# Patient Record
Sex: Male | Born: 1955 | Race: White | Hispanic: No | Smoking: Former smoker
Health system: Southern US, Community
[De-identification: ages and names within clinical notes are randomized; demographics above are authoritative.]

## PROBLEM LIST (undated history)

## (undated) DIAGNOSIS — C801 Malignant (primary) neoplasm, unspecified: Secondary | ICD-10-CM

## (undated) DIAGNOSIS — K4090 Unilateral inguinal hernia, without obstruction or gangrene, not specified as recurrent: Secondary | ICD-10-CM

## (undated) DIAGNOSIS — T7840XA Allergy, unspecified, initial encounter: Secondary | ICD-10-CM

## (undated) HISTORY — PX: MOUTH SURGERY: SHX715

## (undated) HISTORY — DX: Allergy, unspecified, initial encounter: T78.40XA

## (undated) HISTORY — PX: OTHER SURGICAL HISTORY: SHX169

---

## 2008-04-19 ENCOUNTER — Emergency Department (HOSPITAL_COMMUNITY): Admission: EM | Admit: 2008-04-19 | Discharge: 2008-04-19 | Payer: Self-pay | Admitting: Emergency Medicine

## 2016-10-03 ENCOUNTER — Telehealth: Payer: Self-pay | Admitting: Internal Medicine

## 2016-10-03 ENCOUNTER — Ambulatory Visit (HOSPITAL_COMMUNITY)
Admission: EM | Admit: 2016-10-03 | Discharge: 2016-10-03 | Disposition: A | Discharge status: Home / Self Care | Payer: Self-pay | Attending: Family Medicine | Admitting: Family Medicine

## 2016-10-03 DIAGNOSIS — K6289 Other specified diseases of anus and rectum: Secondary | ICD-10-CM

## 2016-10-03 NOTE — Telephone Encounter (Signed)
I have left a voicemail for patient to call back. He is scheduled to see Alonza Bogus, PA-C on Thursday, 10/05/16 @ 3:00 pm. I have contacted Dr Joseph Art to make him aware that an appointment is in place for patient and we are trying to get in contact with patient.

## 2016-10-03 NOTE — Telephone Encounter (Signed)
Dr.Pyrtle PM DOD 10/03/16 please advise as to scheduling.

## 2016-10-03 NOTE — Telephone Encounter (Signed)
I have spoken to patient and have advised of appointment with Janett Billow on Thursday at 3. He verbalizes understanding.

## 2016-10-03 NOTE — Discharge Instructions (Signed)
[facility] will be calling you (Most likely Zenovia Jarred or his nurse) today.  If you don't hear from them, text me at [phone]. They will give you the soonest appt possible.

## 2016-10-03 NOTE — Telephone Encounter (Signed)
Please make patient a consult appointment ASAP with first available provider for rectal or anal mass with bleeding

## 2016-10-03 NOTE — ED Provider Notes (Signed)
[facility]    CSN: [id number] Arrival date & time: 10/03/16  1257     History   Chief Complaint Chief Complaint  Patient presents with  . Mass    HPI David Clarke is a 61 y.o. male.   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 He's had associated rectal bleeding and difficulty with hygiene. Has not had any tenderness however.      No past medical history on file.  There are no active problems to display for this patient.   No past surgical history on file.     Home Medications    Prior to Admission medications   Not on File    Family History No family history on file.  Social History Social History  Substance Use Topics  . Smoking status: Not on file  . Smokeless tobacco: Not on file  . Alcohol use Not on file     Allergies   Sulfa antibiotics   Review of Systems Review of Systems  Constitutional: Negative.   HENT: Negative.   Gastrointestinal: Positive for anal bleeding.     Physical Exam Triage Vital Signs ED Triage Vitals [10/03/16 1331]  Enc Vitals Group     BP 119/80     Pulse Rate 87     Resp 16     Temp 97.5 F (36.4 C)     Temp Source Oral     SpO2 100 %     Weight      Height      Head Circumference      Peak Flow      Pain Score      Pain Loc      Pain Edu?      Excl. in [place]?    No data found.   Updated Vital Signs BP 119/80 (BP Location: Right Arm)   Pulse 87   Temp 97.5 F (36.4 C) (Oral)   Resp 16   SpO2 100%   Visual Acuity Right Eye Distance:   Left Eye Distance:   Bilateral Distance:    Right Eye Near:   Left Eye Near:    Bilateral Near:     Physical Exam  Constitutional: He is oriented to person, place, and time. He appears well-developed and well-nourished.  HENT:  Right Ear: External ear normal.  Left Ear: External ear normal.  Eyes: Conjunctivae are normal. Pupils are equal, round, and reactive to light.  Neck: Normal range of motion. Neck supple.    Pulmonary/Chest: Effort normal.  Abdominal: Soft. Bowel sounds are normal. He exhibits no distension.  Trilobed anal mass  Musculoskeletal: Normal range of motion.  Neurological: He is alert and oriented to person, place, and time.  Skin: Skin is warm and dry.  Nursing note and vitals reviewed.    UC Treatments / Results  Labs (all labs ordered are listed, but only abnormal results are displayed) Labs Reviewed - No data to display  EKG  EKG Interpretation None       Radiology No results found.  Procedures Procedures (including critical care time)  Medications Ordered in UC Medications - No data to display   Initial Impression / Assessment and Plan / UC Course  I have reviewed the triage vital signs and the nursing notes.  Pertinent labs & imaging results that were available during my care of the patient were reviewed by me and considered in my medical decision making (see chart for details).  Final Clinical Impressions(s) / UC Diagnoses   Final diagnoses:  Mass of anus    New Prescriptions New Prescriptions   No medications on file  [facility] will be calling you (Most likely Zenovia Jarred or his nurse) today.  If you don't hear from them, text me at [phone]. They will give you the soonest appt possible.   Robyn Haber, MD 10/03/16 [phone]

## 2016-10-03 NOTE — ED Triage Notes (Addendum)
C/o growth on testicle area Notice discharge  Keeping area clean

## 2016-10-05 ENCOUNTER — Encounter: Payer: Self-pay | Admitting: Gastroenterology

## 2016-10-05 ENCOUNTER — Ambulatory Visit (INDEPENDENT_AMBULATORY_CARE_PROVIDER_SITE_OTHER): Payer: BLUE CROSS/BLUE SHIELD | Admitting: Gastroenterology

## 2016-10-05 ENCOUNTER — Other Ambulatory Visit (INDEPENDENT_AMBULATORY_CARE_PROVIDER_SITE_OTHER): Payer: BLUE CROSS/BLUE SHIELD

## 2016-10-05 VITALS — BP 120/68 | HR 82 | Ht 72.0 in | Wt 137.0 lb

## 2016-10-05 DIAGNOSIS — K625 Hemorrhage of anus and rectum: Secondary | ICD-10-CM

## 2016-10-05 DIAGNOSIS — K629 Disease of anus and rectum, unspecified: Secondary | ICD-10-CM

## 2016-10-05 DIAGNOSIS — K6289 Other specified diseases of anus and rectum: Secondary | ICD-10-CM

## 2016-10-05 DIAGNOSIS — C20 Malignant neoplasm of rectum: Secondary | ICD-10-CM | POA: Insufficient documentation

## 2016-10-05 LAB — CBC WITH DIFFERENTIAL/PLATELET
Basophils Absolute: 0 10*3/uL (ref 0.0–0.1)
Basophils Relative: 0.4 % (ref 0.0–3.0)
EOS PCT: 0.4 % (ref 0.0–5.0)
Eosinophils Absolute: 0 10*3/uL (ref 0.0–0.7)
HCT: 26.1 % — ABNORMAL LOW (ref 39.0–52.0)
HEMOGLOBIN: 8.3 g/dL — AB (ref 13.0–17.0)
LYMPHS PCT: 14.1 % (ref 12.0–46.0)
Lymphs Abs: 1.5 10*3/uL (ref 0.7–4.0)
MCHC: 31.7 g/dL (ref 30.0–36.0)
MCV: 73.6 fl — AB (ref 78.0–100.0)
Monocytes Absolute: 0.9 10*3/uL (ref 0.1–1.0)
Monocytes Relative: 8.5 % (ref 3.0–12.0)
Neutro Abs: 8.2 10*3/uL — ABNORMAL HIGH (ref 1.4–7.7)
Neutrophils Relative %: 76.6 % (ref 43.0–77.0)
Platelets: 630 10*3/uL — ABNORMAL HIGH (ref 150.0–400.0)
RBC: 3.55 Mil/uL — AB (ref 4.22–5.81)
RDW: 15.5 % (ref 11.5–15.5)
WBC: 10.7 10*3/uL — ABNORMAL HIGH (ref 4.0–10.5)

## 2016-10-05 LAB — BASIC METABOLIC PANEL
BUN: 13 mg/dL (ref 6–23)
CALCIUM: 8.9 mg/dL (ref 8.4–10.5)
CHLORIDE: 100 meq/L (ref 96–112)
CO2: 28 meq/L (ref 19–32)
CREATININE: 0.74 mg/dL (ref 0.40–1.50)
GFR: 114.22 mL/min (ref >60.00)
GLUCOSE: 104 mg/dL — AB (ref 70–99)
Potassium: 4.1 mEq/L (ref 3.5–5.1)
Sodium: 136 mEq/L (ref 135–145)

## 2016-10-05 MED ORDER — NA SULFATE-K SULFATE-MG SULF 17.5-3.13-1.6 GM/177ML PO SOLN: 1.0000 | ORAL | 0 refills | Status: DC

## 2016-10-05 NOTE — Progress Notes (Addendum)
     10/05/2016 [name] [id number] [birth year]   HISTORY OF PRESENT ILLNESS:  This is a pleasant 61 year old male who is new to our practice. He was referred to our office by Dr. Joseph Art, who is a friend of his. He says he noticed an anal or rectal mass about 5 months ago, but he did not have insurance and did not get it checked. It has grown significantly over the past 5 months. He now has insurance and saw Dr. Joseph Art just 2 days ago. This urgent referral was made to our office. The patient admits to intermittent rectal bleeding. He tells me that he has been passing stool and moves his bowels quite regularly on a daily basis. He does have some discomfort in his rectum and anal area.  History reviewed. No pertinent past medical history. Past Surgical History:  Procedure Laterality Date  . MOUTH SURGERY     age 63, to remove extra "eye" teeth    reports that he quit smoking about 16 years ago. His smoking use included Cigarettes. He quit after 2.00 years of use. His smokeless tobacco use includes Chew. He reports that he drinks alcohol. He reports that he does not use drugs. family history includes Emphysema in his father; Other in his mother; Prostate cancer in his father; Stroke in his mother. Allergies  Allergen Reactions  . Sulfa Antibiotics Nausea Only      No outpatient encounter prescriptions on file as of 10/05/2016.   No facility-administered encounter medications on file as of 10/05/2016.      REVIEW OF SYSTEMS  : All other systems reviewed and negative except where noted in the History of Present Illness.   PHYSICAL EXAM: BP 120/68   Pulse 82   Ht 6' (1.829 m)   Wt 137 lb (62.1 kg)   BMI 18.58 kg/m  General: Well developed white male in no acute distress Head: Normocephalic and atraumatic Eyes:  Sclerae anicteric, conjunctiva pink. Ears: Normal auditory acuity Lungs: Clear throughout to auscultation Heart: Regular rate and rhythm Abdomen: Soft, non-distended.  Normal bowel sounds.  Non-tender. Rectal:  Large fungating mass protruding from the rectum.  This is very friable and bled on contact.  DRE was performed and the mass was felt internally as well. Musculoskeletal: Symmetrical with no gross deformities  Skin: No lesions on visible extremities Extremities: No edema  Neurological: Alert oriented x 4, grossly non-focal Psychological:  Alert and cooperative. Normal mood and affect  ASSESSMENT AND PLAN: -Rectal mass:  This most certainly looks malignant.  Discussed with Dr. Loletha Carrow who examined him as well.  The patient was referred to and specifically requested Dr. Hilarie Fredrickson. I am scheduling him for colonoscopy with Dr. Hilarie Fredrickson early next week. We will also obtain CT scan of the abdomen and pelvis with contrast. We'll check a CBC and BMP.  I am having our office contact colorectal surgery, Dr. Marcello Moores, to arrange for appt with her in the next couple of weeks as well.   CC:  No ref. provider found  Addendum: Reviewed and agree with initial management. Jerene Bears, MD

## 2016-10-05 NOTE — Patient Instructions (Signed)
You have been scheduled for a colonoscopy. Please follow written instructions given to you at your visit today.  Please pick up your prep supplies at the pharmacy within the next 1-3 days. If you use inhalers (even only as needed), please bring them with you on the day of your procedure. Your physician has requested that you go to www.startemmi.com and enter the access code given to you at your visit today. This web site gives a general overview about your procedure. However, you should still follow specific instructions given to you by our office regarding your preparation for the procedure.  You have been scheduled for a CT scan of the abdomen and pelvis at [facility] ([address]---this is in the same building as Press [facility]).   You are scheduled on 10-13-16 at 11 am. You should arrive 15 minutes prior to your appointment time for registration. Please follow the written instructions below on the day of your exam:  WARNING: IF YOU ARE ALLERGIC TO IODINE/X-RAY DYE, PLEASE NOTIFY RADIOLOGY IMMEDIATELY AT [phone]! YOU WILL BE GIVEN A 13 HOUR PREMEDICATION PREP.  1) Do not eat or drink anything after 7 am (4 hours prior to your test) 2) You have been given 2 bottles of oral contrast to drink. The solution may taste               better if refrigerated, but do NOT add ice or any other liquid to this solution. Shake             well before drinking.    Drink 1 bottle of contrast @ 9 am (2 hours prior to your exam)  Drink 1 bottle of contrast @ 10 am (1 hour prior to your exam)  You may take any medications as prescribed with a small amount of water except for the following: Metformin, Glucophage, Glucovance, Avandamet, Riomet, Fortamet, Actoplus Met, Janumet, Glumetza or Metaglip. The above medications must be held the day of the exam AND 48 hours after the exam.  The purpose of you drinking the oral contrast is to aid in the visualization of your intestinal tract. The  contrast solution may cause some diarrhea. Before your exam is started, you will be given a small amount of fluid to drink. Depending on your individual set of symptoms, you may also receive an intravenous injection of x-ray contrast/dye. Plan on being at [facility] for 30 minutes or longer, depending on the type of exam you are having performed.  This test typically takes 30-45 minutes to complete.  If you have any questions regarding your exam or if you need to reschedule, you may call the CT department at [phone] between the hours of 8:00 am and 5:00 pm, Monday-Friday.  ________________________________________________________________________   We will call you with a referral to [facility].

## 2016-10-06 ENCOUNTER — Telehealth: Payer: Self-pay | Admitting: Emergency Medicine

## 2016-10-06 NOTE — Telephone Encounter (Signed)
Appointment with [facility] is 10/17/16 at 10:15 am with Dr. Johney Maine. Patient is aware.

## 2016-10-09 ENCOUNTER — Telehealth: Payer: Self-pay

## 2016-10-09 ENCOUNTER — Other Ambulatory Visit: Payer: Self-pay

## 2016-10-09 ENCOUNTER — Ambulatory Visit (AMBULATORY_SURGERY_CENTER): Payer: BLUE CROSS/BLUE SHIELD | Admitting: Internal Medicine

## 2016-10-09 ENCOUNTER — Encounter: Payer: Self-pay | Admitting: Internal Medicine

## 2016-10-09 VITALS — BP 120/72 | HR 77 | Temp 98.0°F | Resp 14 | Ht 72.0 in | Wt 137.0 lb

## 2016-10-09 DIAGNOSIS — K6289 Other specified diseases of anus and rectum: Secondary | ICD-10-CM

## 2016-10-09 DIAGNOSIS — C801 Malignant (primary) neoplasm, unspecified: Secondary | ICD-10-CM

## 2016-10-09 DIAGNOSIS — K629 Disease of anus and rectum, unspecified: Secondary | ICD-10-CM

## 2016-10-09 DIAGNOSIS — K625 Hemorrhage of anus and rectum: Secondary | ICD-10-CM

## 2016-10-09 HISTORY — DX: Malignant (primary) neoplasm, unspecified: C80.1

## 2016-10-09 HISTORY — PX: COLONOSCOPY W/ BIOPSIES: SHX1374

## 2016-10-09 MED ORDER — SODIUM CHLORIDE 0.9 % IV SOLN: 500.0000 mL | INTRAVENOUS | Status: DC

## 2016-10-09 NOTE — Patient Instructions (Signed)
YOU HAD AN ENDOSCOPIC PROCEDURE TODAY AT [facility]:   Refer to the procedure report that was given to you for any specific questions about what was found during the examination.  If the procedure report does not answer your questions, please call your gastroenterologist to clarify.  If you requested that your care partner not be given the details of your procedure findings, then the procedure report has been included in a sealed envelope for you to review at your convenience later.  YOU SHOULD EXPECT: Some feelings of bloating in the abdomen. Passage of more gas than usual.  Walking can help get rid of the air that was put into your GI tract during the procedure and reduce the bloating. If you had a lower endoscopy (such as a colonoscopy or flexible sigmoidoscopy) you may notice spotting of blood in your stool or on the toilet paper. If you underwent a bowel prep for your procedure, you may not have a normal bowel movement for a few days.  Please Note:  You might notice some irritation and congestion in your nose or some drainage.  This is from the oxygen used during your procedure.  There is no need for concern and it should clear up in a day or so.  SYMPTOMS TO REPORT IMMEDIATELY:   For urgent or emergent issues, a gastroenterologist can be reached at any hour by calling [phone].   DIET:  Low fiber diet until further notice..  Drink plenty of fluids but you should avoid alcoholic beverages for 24 hours.  ACTIVITY:  You should plan to take it easy for the rest of today and you should NOT DRIVE or use heavy machinery until tomorrow (because of the sedation medicines used during the test).    FOLLOW UP: Our staff will call the number listed on your records the next business day following your procedure to check on you and address any questions or concerns that you may have regarding the information given to you following your procedure. If we do not reach you, we will leave  a message.  However, if you are feeling well and you are not experiencing any problems, there is no need to return our call.  We will assume that you have returned to your regular daily activities without incident.  If any biopsies were taken you will be contacted by phone or by letter within the next 1-3 weeks.  Please call us at [phone] if you have not heard about the biopsies in 3 weeks.    SIGNATURES/CONFIDENTIALITY: You and/or your care partner have signed paperwork which will be entered into your electronic medical record.  These signatures attest to the fact that that the information above on your After Visit Summary has been reviewed and is understood.  Full responsibility of the confidentiality of this discharge information lies with you and/or your care-partner.  Your pathology was sent rush for you.  The 3rd floor staff will arrange your CT's for you.  Call us if you need [place] !

## 2016-10-09 NOTE — Progress Notes (Signed)
Pt reported last BM was dark, brown liquid.  He said he was unable to see through it.  I reported this to Dr. Hilarie Fredrickson and he said we will proceed with colonoscopy without any further prep.  Also reported to Joe Rybecki, CRNA pt had drank approximately 2 oz of gator-aid at 13:45. maw

## 2016-10-09 NOTE — Telephone Encounter (Signed)
Per Dr. Hilarie Fredrickson CT of chest needs to be added for pt. Spoke with [place] CT and order placed in epic and has been added for 10/13/16.

## 2016-10-09 NOTE — Progress Notes (Signed)
A/ox3, pleased with MAC, report to RN 

## 2016-10-09 NOTE — Progress Notes (Signed)
Called to room to assist during endoscopic procedure.  Patient ID and intended procedure confirmed with present staff. Received instructions for my participation in the procedure from the performing physician.  

## 2016-10-09 NOTE — Op Note (Signed)
[facility] Patient Name: David Clarke Procedure Date: 10/09/2016 3:10 PM MRN: [id number] Endoscopist: Jerene Bears , MD Age: 61 Referring MD:  Date of Birth: 01-25-1956 Gender: Male Account #: [id number] Procedure:                Colonoscopy Indications:              Rectal bleeding, Abnormal rectal exam Medicines:                Monitored Anesthesia Care Procedure:                Pre-Anesthesia Assessment:                           - Prior to the procedure, a History and Physical                            was performed, and patient medications and                            allergies were reviewed. The patient's tolerance of                            previous anesthesia was also reviewed. The risks                            and benefits of the procedure and the sedation                            options and risks were discussed with the patient.                            All questions were answered, and informed consent                            was obtained. Prior Anticoagulants: The patient has                            taken no previous anticoagulant or antiplatelet                            agents. ASA Grade Assessment: II - A patient with                            mild systemic disease. After reviewing the risks                            and benefits, the patient was deemed in                            satisfactory condition to undergo the procedure.                           After obtaining informed consent, the colonoscope  was passed under direct vision. Throughout the                            procedure, the patient's blood pressure, pulse, and                            oxygen saturations were monitored continuously. The                            Model PCF-[id number]) scope was introduced                            through the anus with the intention of advancing to                            the cecum. The scope was  advanced to the rectum                            before the procedure was aborted. Medications were                            given. The colonoscopy was performed with                            difficulty due to a partially obstructing mass. The                            patient tolerated the procedure well. The quality                            of the bowel preparation was fair. The rectum was                            photographed. Scope In: 3:27:38 PM Scope Out: 3:36:11 PM Total Procedure Duration: 0 hours 8 minutes 33 seconds  Findings:                 The digital rectal exam revealed a large, firm,                            prolapsing and obstructing rectal mass. The mass                            was circumferential.                           A fungating, infiltrative and ulcerated partially                            obstructing large mass was found in the distal                            rectum. The mass was circumferential. The mass  measured seven cm in length (prolapsing and from                            dentate line to approx 7 cm). In addition, its                            inner diameter measured nine mm. Oozing was                            present. This was biopsied with a cold forceps for                            histology.                           Due to the distal rectal mass the bowel preparation                            was poor and unable to be cleared via the adult                            upper endoscope (adult upper endoscope used due to                            obstructing mass). Scope not advanced proximal to                            the rectosigmoid colon. Complications:            No immediate complications. Estimated Blood Loss:     Estimated blood loss was minimal. Impression:               - Preparation of the colon was fair/poor due to                            obstructing rectal mass.                            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 Biopsied.                           - Incomplete colonoscopy. Recommendation:           - Patient has a contact number available for                            emergencies. The signs and symptoms of potential                            delayed complications were discussed with the  patient. Return to normal activities tomorrow.                            Written discharge instructions were provided to the                            patient.                           - Low fiber diet.                           - Continue present medications.                           - Await pathology results.                           - Add CT chest to CT abd/pelvis ordered for later                            this week.                           - Surgical and oncology referrals.                           - Full colonoscopy once treatment plan outlined. Jerene Bears, MD 10/09/2016 3:51:11 PM This report has been signed electronically.

## 2016-10-10 ENCOUNTER — Telehealth: Payer: Self-pay | Admitting: *Deleted

## 2016-10-10 ENCOUNTER — Telehealth: Payer: Self-pay | Admitting: Internal Medicine

## 2016-10-10 ENCOUNTER — Other Ambulatory Visit: Payer: Self-pay

## 2016-10-10 DIAGNOSIS — K6289 Other specified diseases of anus and rectum: Secondary | ICD-10-CM

## 2016-10-10 NOTE — Telephone Encounter (Signed)
  Follow up Call-  Call back number 10/09/2016  Post procedure Call Back phone  # [phone] cell  Permission to leave phone message Yes  Some recent data might be hidden     Patient questions:  Do you have a fever, pain , or abdominal swelling? No. Pain Score  0 *  Have you tolerated food without any problems? Yes.    Have you been able to return to your normal activities? Yes.    Do you have any questions about your discharge instructions: Diet   No. Medications  No. Follow up visit  No.  Do you have questions or concerns about your Care? No.  Actions: * If pain score is 4 or above: No action needed, pain <4.

## 2016-10-10 NOTE — Telephone Encounter (Signed)
Discussed with pt that he is having the ct scan on 10/13/16. Pt was confused because [facility] called about an appt on 10/17/16 and he thought that was for the CT. Pts questions answered.

## 2016-10-12 ENCOUNTER — Telehealth: Payer: Self-pay | Admitting: Internal Medicine

## 2016-10-12 NOTE — Telephone Encounter (Signed)
Spoke with pt and he originally told Dr. Hilarie Fredrickson that he wanted to know his CT results as soon as we got them back tomorrow. Pt has now decided he does not want the results until Monday, states he does not want to deal with bad news over the weekend. Dr. Loletha Carrow had agreed to call pt with results tomorrow. Dr. Hilarie Fredrickson and Dr. Loletha Carrow notified.

## 2016-10-13 ENCOUNTER — Ambulatory Visit (INDEPENDENT_AMBULATORY_CARE_PROVIDER_SITE_OTHER)
Admission: RE | Admit: 2016-10-13 | Discharge: 2016-10-13 | Disposition: A | Discharge status: Home / Self Care | Payer: BLUE CROSS/BLUE SHIELD | Source: Ambulatory Visit | Attending: Gastroenterology | Admitting: Gastroenterology

## 2016-10-13 ENCOUNTER — Ambulatory Visit (INDEPENDENT_AMBULATORY_CARE_PROVIDER_SITE_OTHER)
Admission: RE | Admit: 2016-10-13 | Discharge: 2016-10-13 | Disposition: A | Discharge status: Home / Self Care | Payer: BLUE CROSS/BLUE SHIELD | Source: Ambulatory Visit | Attending: Internal Medicine | Admitting: Internal Medicine

## 2016-10-13 DIAGNOSIS — K629 Disease of anus and rectum, unspecified: Secondary | ICD-10-CM | POA: Diagnosis not present

## 2016-10-13 DIAGNOSIS — K625 Hemorrhage of anus and rectum: Secondary | ICD-10-CM

## 2016-10-13 DIAGNOSIS — K6289 Other specified diseases of anus and rectum: Secondary | ICD-10-CM

## 2016-10-13 MED ORDER — IOPAMIDOL (ISOVUE-300) INJECTION 61%
100.0000 mL | Freq: Once | INTRAVENOUS | Status: AC | PRN
  Administered 2016-10-13: 100 mL via INTRAVENOUS

## 2016-10-16 ENCOUNTER — Encounter: Payer: Self-pay | Admitting: Hematology

## 2016-10-16 ENCOUNTER — Encounter: Payer: Self-pay | Admitting: Radiation Oncology

## 2016-10-17 ENCOUNTER — Other Ambulatory Visit: Payer: Self-pay | Admitting: Surgery

## 2016-10-17 ENCOUNTER — Encounter: Payer: Self-pay | Admitting: Surgery

## 2016-10-17 ENCOUNTER — Telehealth: Payer: Self-pay | Admitting: Internal Medicine

## 2016-10-17 ENCOUNTER — Encounter: Payer: Self-pay | Admitting: Radiation Oncology

## 2016-10-17 DIAGNOSIS — C20 Malignant neoplasm of rectum: Secondary | ICD-10-CM

## 2016-10-17 NOTE — Progress Notes (Addendum)
GI Location of Tumor / Histology: Rectal Cancer  David Clarke presented  months ago with symptoms of:  Intermittent Bleeding, pain , patient noticed mass 5 months ago, but without insurance,didn't get it checked,   Biopsies of  (if applicable) revealed: Diagnosis 10/09/2016: Rectum, biopsy, mass - ADENOCARCINOMA.  Past/Anticipated interventions by surgeon, if any: Dr. Pyrtle,colonoscopy with biopsy 10/09/16, surgical consult 10/17/16 scheduled with Dr. Marcello Moores  Past/Anticipated interventions by medical oncology, if any: sees Dr. Burr Medico today at noon  Weight changes, if any: loss 13-15 lbs since december Bowel/Bladder complaints, if any: discharge,  And some bleeding at night with bowel movement, wears pad  Nausea / Vomiting, if any: No  Pain issues, if any:  Discon=mfort with bowel movenments  Any blood per rectum:     SAFETY ISSUES:  Prior radiation?  NO  Pacemaker/ICD? NO   Is the patient on methotrexate? No  Current Complaints/Details: after 2 years  quit smoking cigarettes 16 years ago, does use chewing tobacco, drinks alcohol, no drug use  Father emphysema,, prostate cancr,  Mother Stroke BP 119/73 (BP Location: Left Arm, Patient Position: Sitting, Cuff Size: Normal)   Pulse 95   Temp 99.5 F (37.5 C) (Oral)   Wt Readings from Last 3 Encounters:  10/24/16 137 lb (62.1 kg)  10/09/16 137 lb (62.1 kg)  10/05/16 137 lb (62.1 kg)

## 2016-10-17 NOTE — Telephone Encounter (Signed)
Dr. Hilarie Fredrickson please see note below and call pt regarding CT scan.

## 2016-10-17 NOTE — Telephone Encounter (Signed)
I called the patient this morning at 11:40 am and left a message to discuss results of staging CT scan I called back this afternoon at 4:50 pm and again got voicemail.  I left word that I would try him back again tomorrow

## 2016-10-20 ENCOUNTER — Ambulatory Visit (HOSPITAL_COMMUNITY): Admission: RE | Admit: 2016-10-20 | Payer: BLUE CROSS/BLUE SHIELD | Source: Ambulatory Visit

## 2016-10-20 ENCOUNTER — Telehealth: Payer: Self-pay | Admitting: Hematology

## 2016-10-20 NOTE — Telephone Encounter (Signed)
Pt cld wanting to reschedule appt w/Dr. Burr Medico. MRI was scheduled on the evening he was due to see her. Per Dr, Burr Medico, ok schedule the pt to see her following his appt w/Dr. Lisbeth Renshaw on 2/28. Pt agreed to the appt.

## 2016-10-24 ENCOUNTER — Ambulatory Visit (HOSPITAL_COMMUNITY)
Admission: RE | Admit: 2016-10-24 | Discharge: 2016-10-24 | Disposition: A | Discharge status: Home / Self Care | Payer: BLUE CROSS/BLUE SHIELD | Source: Ambulatory Visit | Attending: Surgery | Admitting: Surgery

## 2016-10-24 ENCOUNTER — Ambulatory Visit: Payer: BLUE CROSS/BLUE SHIELD | Admitting: Hematology

## 2016-10-24 DIAGNOSIS — K4090 Unilateral inguinal hernia, without obstruction or gangrene, not specified as recurrent: Secondary | ICD-10-CM | POA: Diagnosis not present

## 2016-10-24 DIAGNOSIS — N433 Hydrocele, unspecified: Secondary | ICD-10-CM | POA: Insufficient documentation

## 2016-10-24 DIAGNOSIS — C20 Malignant neoplasm of rectum: Secondary | ICD-10-CM | POA: Insufficient documentation

## 2016-10-24 MED ORDER — GADOBENATE DIMEGLUMINE 529 MG/ML IV SOLN
15.0000 mL | Freq: Once | INTRAVENOUS | Status: AC | PRN
  Administered 2016-10-24: 12 mL via INTRAVENOUS

## 2016-10-24 NOTE — Progress Notes (Signed)
[facility]  Telephone:([phone]) [phone] Fax:([phone]) [place] Note   Patient Care Team: Robyn Haber, MD as PCP - General (Family Medicine) Michael Boston, MD as Consulting Physician (General Surgery) Jerene Bears, MD as Consulting Physician (Gastroenterology) Truitt Merle, MD as Consulting Physician (Hematology) Kyung Rudd, MD as Consulting Physician (Radiation Oncology) 10/25/2016  CHIEF COMPLAINTS/PURPOSE OF CONSULTATION:  Rectal adenocarcinoma   Oncology History   Cancer Staging Rectal adenocarcinoma  Staging form: Colon and Rectum, AJCC 8th Edition - Clinical stage from 10/09/2016: Stage IIIB (cT3, cN1b, cM0) - Signed by Truitt Merle, MD on 10/25/2016       Rectal adenocarcinoma    10/03/2016 - 10/03/2016 Hospital Admission    Admit date: 10/03/16 The patient presented to the [facility] ED with rectal bleeding and difficulty with hygiene.      10/09/2016 Initial Diagnosis    Rectal adenocarcinoma       10/09/2016 Procedure    Colonoscopy showed a fungating, infiltrative and ulcerated partially obstructing large mass in the distal rectum, measuring 9 cm, prolapsing at anal canal. Scope not advanced proximal to the rectosigmoid colon.      10/09/2016 Initial Biopsy    Rectal mass biopsy showed invasive adenocarcinoma.      10/13/2016 Imaging    CT Chest Abdomen Pelvis w contrast IMPRESSION: Large irregular enhancing rectal mass consistent with known rectal cancer. There are small perirectal and small pelvic sidewall lymph nodes which are suspicious. Prominent perirectal vessels are also noted. No evidence of metastatic retroperitoneal or sigmoid mesocolon adenopathy or hepatic or pulmonary metastasis.      10/24/2016 Imaging    MRI Pelvis IMPRESSION: 1. Moderately motion degraded exam. 2. Large mass is centered at the anus with extension into the low rectum. No complicating obstruction. 3. Mild mesorectal adenopathy, suspicious. 4. Right  inguinal hernia containing nonobstructive small bowel.        HISTORY OF PRESENTING ILLNESS (10/25/16):  David Clarke 61 y.o. male is here because of his recently diagnosed rectal adenocarcinoma. He was referred by his gastroenterologist title parietal. Patient presents to my clinic, accompanied by his wife and his daughter.   He has noticed a anal mass accompanied by intermittent bleeding and pain in the rectum for the past 5 months. The patient initially presented to the [facility] ED on 10/03/16 with rectal bleeding and difficulty with hygeine. He was without health insurance previously and so did not pursue evaluation or treatment until this time.  The patient was seen by Dr. Hilarie Fredrickson for a colonoscopy and biopsy on 10/09/16, revealing a large partially obstructing low rectal mass, prolapsing to anal verge. Biopsy showed adenocarcinoma. CT C/A/P on 10/13/16 revealed a large, irregular, enhancing mass consistent with known rectal cancer. There were several suspicious small perirectal and small pelvic sidewall lymph nodes. Prominent perirectal vessels were also noted. MRI of the pelvis on 10/24/16 showed a large mass centered at the anus with extension into the low rectum without obstruction. There was suspicious mild mesorectal adenopathy, and a right inguinal hernia containing nonobstructive small bowel.   The patient comes to the clinic for consultation today accompanied by his partner and daughter. He denies abdominal cramps, nausea, bowel or bladder concerns. He reports taking OTC stool softener as needed. He reports his appetite is good, but he has lost 12-15 lbs in the past few weeks. His daughter notes she has hemochromatosis and questions if this could be playing a role in the patient's anemia.   MEDICAL HISTORY:  Past Medical History:  Diagnosis Date  . Allergy   . Cancer ([facility]) 10/09/2016   rectal adenocarcinoma    SURGICAL HISTORY: Past Surgical History:  Procedure Laterality Date  .  COLONOSCOPY W/ BIOPSIES Bilateral 10/09/2016   rectal adenocarcinoma  . MOUTH SURGERY     age 64, to remove extra "eye" teeth  . [place] teeth extraction      SOCIAL HISTORY: Social History   Social History  . Marital status: Single    Spouse name: N/A  . Number of children: 1  . Years of education: N/A   Occupational History  . restaurant/caterer    Social History Main Topics  . Smoking status: Former Smoker    Years: 2.00    Types: Cigarettes    Quit date: 2002  . Smokeless tobacco: Never Used  . Alcohol use 6.0 oz/week    4 Glasses of wine, 6 Cans of beer per week     Comment: beer or a glass or wine most days of the week  . Drug use: No  . Sexual activity: Yes    Partners: Female   Other Topics Concern  . Not on file   Social History Narrative  . No narrative on file    FAMILY HISTORY: Family History  Problem Relation Age of Onset  . Other Mother     bile duct tumor  . Stroke Mother   . Cancer Mother 7    bile duct cancer   . Prostate cancer Father 48  . Emphysema Father   . Colon cancer Neg Hx   . Esophageal cancer Neg Hx   . Pancreatic cancer Neg Hx   . Rectal cancer Neg Hx   . Stomach cancer Neg Hx     ALLERGIES:  is allergic to sulfa antibiotics.  MEDICATIONS:  Current Outpatient Prescriptions  Medication Sig Dispense Refill  . acetaminophen (TYLENOL) 325 MG tablet Take 650 mg by mouth every 6 (six) hours as needed.    . Sennosides (SENOKOT PO) Take 1 tablet by mouth as needed. [facility] brand     Current Facility-Administered Medications  Medication Dose Route Frequency Provider Last Rate Last Dose  . 0.9 %  sodium chloride infusion  500 mL Intravenous Continuous Jerene Bears, MD        REVIEW OF SYSTEMS:   Constitutional: Denies fevers, chills or abnormal night sweats Eyes: Denies blurriness of vision, double vision or watery eyes Ears, nose, mouth, throat, and face: Denies mucositis or sore throat Respiratory: Denies cough, dyspnea or  wheezes Cardiovascular: Denies palpitation, chest discomfort or lower extremity swelling Gastrointestinal:  Denies nausea, heartburn or change in bowel habits Skin: Denies abnormal skin rashes Lymphatics: Denies new lymphadenopathy or easy bruising Neurological:Denies numbness, tingling or new weaknesses Behavioral/Psych: Mood is stable, no new changes  All other systems were reviewed with the patient and are negative.   PHYSICAL EXAMINATION: ECOG PERFORMANCE STATUS: 1 - Symptomatic but completely ambulatory  Vitals:   10/25/16 1224  BP: 131/80  Pulse: 76  Resp: 18  Temp: 97.9 F (36.6 C)   Filed Weights   10/25/16 1224  Weight: 138 lb 11.2 oz (62.9 kg)    GENERAL:alert, no distress and comfortable.  SKIN: skin color, texture, turgor are normal, no rashes or significant lesions EYES: normal, conjunctiva are pink and non-injected, sclera clear OROPHARYNX:no exudate, no erythema and lips, buccal mucosa, and tongue normal  NECK: supple, thyroid normal size, non-tender, without nodularity LYMPH:  no palpable lymphadenopathy in the cervical,  axillary or inguinal LUNGS: clear to auscultation and percussion with normal breathing effort HEART: regular rate & rhythm and no murmurs and no lower extremity edema ABDOMEN:abdomen soft, non-tender and normal bowel sounds Musculoskeletal:no cyanosis of digits and no clubbing  PSYCH: alert & oriented x 3 with fluent speech NEURO: no focal motor/sensory deficits.  RECTAL: there is a large prolapsing mass at his anal verge, no bleeding, Rectal exam was not performed.   LABORATORY DATA:  I have reviewed the data as listed CBC Latest Ref Rng & Units 10/05/2016  WBC 4.0 - 10.5 K/uL 10.7(H)  Hemoglobin 13.0 - 17.0 g/dL 8.3(L)  Hematocrit 39.0 - 52.0 % 26.1(L)  Platelets 150.0 - 400.0 K/uL 630.0(H)   CMP Latest Ref Rng & Units 10/05/2016  Glucose 70 - 99 mg/dL 104(H)  BUN 6 - 23 mg/dL 13  Creatinine 0.40 - 1.50 mg/dL 0.74  Sodium 135 - 145  mEq/L 136  Potassium 3.5 - 5.1 mEq/L 4.1  Chloride 96 - 112 mEq/L 100  CO2 19 - 32 mEq/L 28  Calcium 8.4 - 10.5 mg/dL 8.9   PATHOLOGY:  Diagnosis 10/09/2016 Rectum, biopsy, mass - ADENOCARCINOMA. Microscopic Comment The features are consistent with colorectal-type adenocarcinoma.  RADIOGRAPHIC STUDIES: I have personally reviewed the radiological images as listed and agreed with the findings in the report. Ct Chest W Contrast  Result Date: 10/13/2016 CLINICAL DATA:  Newly diagnosed rectal cancer. Rectal bleeding for several months. EXAM: CT CHEST, ABDOMEN, AND PELVIS WITH CONTRAST TECHNIQUE: Multidetector CT imaging of the chest, abdomen and pelvis was performed following the standard protocol during bolus administration of intravenous contrast. CONTRAST:  173m ISOVUE-300 IOPAMIDOL (ISOVUE-300) INJECTION 61% COMPARISON:  None. FINDINGS: CT CHEST FINDINGS Chest wall: No chest wall mass, supraclavicular or axillary lymphadenopathy. Small scattered lymph nodes are noted. The thyroid gland appears normal. Cardiovascular: The heart is normal in size. No pericardial effusion. Mild tortuosity of the thoracic aorta but no aneurysm or dissection. The branch vessels patent. No definite coronary artery calcifications. Mediastinum/Nodes: No mediastinal or hilar mass or adenopathy. The esophagus is normal. Lungs/Pleura: No acute pulmonary findings. No worrisome pulmonary lesions to suggest metastatic disease. No pleural effusion. Musculoskeletal: No significant bony findings. CT ABDOMEN PELVIS FINDINGS Hepatobiliary: No focal hepatic lesions to suggest metastatic disease. The gallbladder is normal. No common bile duct dilatation. The portal and hepatic veins are patent. Pancreas: No mass, inflammation or ductal dilatation. Spleen: Normal size.  No focal lesions. Adrenals/Urinary Tract: The adrenal glands and kidneys are unremarkable. Bladder is normal. Stomach/Bowel: The stomach, duodenum, small bowel and colon  are unremarkable. No inflammatory changes, mass lesions or obstructive findings. Bilateral inguinal hernias, right much larger than left both containing small bowel loops but no obstruction or incarceration. There is a large irregular enhancing rectal mass measuring approximately 7.6 x 5.6 x 4.3 cm. Anal involvement is possible. No involvement of the ischiorectal fossa. There are prominent perirectal vessels and small scattered perirectal lymph nodes. The largest measures 7 mm on the left side on image number 119. No inguinal adenopathy. No definite adenopathy along the sigmoid mesocolon for retroperitoneum. Vascular/Lymphatic: The aorta is normal in caliber. No dissection. The branch vessels are patent. The major venous structures are patent. No mesenteric or retroperitoneal mass or adenopathy. Small scattered lymph nodes are noted. Reproductive: The prostate gland and seminal vesicles are unremarkable. Other: No free pelvic fluid collections. No abdominal wall hernia or subcutaneous lesions. Musculoskeletal: No significant bony findings. IMPRESSION: Large irregular enhancing rectal mass consistent with known rectal  cancer. There are small perirectal and small pelvic sidewall lymph nodes which are suspicious. Prominent perirectal vessels are also noted. No evidence of metastatic retroperitoneal or sigmoid mesocolon adenopathy or hepatic or pulmonary metastasis. Electronically Signed   By: Marijo Sanes M.D.   On: 10/13/2016 13:48   Mr Pelvis W Wo Contrast  Result Date: 10/25/2016 CLINICAL DATA:  Anal/rectal mass. Rectal bleeding. Enlarging over the last 5 months. EXAM: MRI PELVIS WITHOUT AND WITH CONTRAST TECHNIQUE: Multiplanar multisequence MR imaging of the pelvis was performed both before and after administration of intravenous contrast. CONTRAST:  55m MULTIHANCE GADOBENATE DIMEGLUMINE 529 MG/ML IV SOLN COMPARISON:  CT of 10/13/2016 FINDINGS: Moderate motion degradation, especially involving the  postcontrast images. Urinary Tract:  Normal urinary bladder.  No distal hydroureter. Bowel: Small bowel positioned within a right inguinal hernia. No obstruction. No colonic obstruction. A circumferential mass involving the anus and low rectum. Measures up to 8.5 cm craniocaudal on image 29/series 5. Is intimately associated with the external sphincter, with apparent extension to the anal introitus. No gross extension into the ischioanal fossa. No extension into the perirectal fat in its superior most portion. Vascular/Lymphatic: No pelvic aneurysm. A 7 mm node in the 4 o'clock position of the mesorectum is suspicious. Example image 46/series 6. Reproductive:  Grossly normal prostate.  Small bilateral hydroceles. Other:  No significant free fluid. Musculoskeletal: No acute osseous abnormality. IMPRESSION: 1. Moderately motion degraded exam. 2. Large mass is centered at the anus with extension into the low rectum. No complicating obstruction. 3. Mild mesorectal adenopathy, suspicious. 4. Right inguinal hernia containing nonobstructive small bowel. Electronically Signed   By: KAbigail MiyamotoM.D.   On: 10/25/2016 07:37   Ct Abdomen Pelvis W Contrast  Result Date: 10/13/2016 CLINICAL DATA:  Newly diagnosed rectal cancer. Rectal bleeding for several months. EXAM: CT CHEST, ABDOMEN, AND PELVIS WITH CONTRAST TECHNIQUE: Multidetector CT imaging of the chest, abdomen and pelvis was performed following the standard protocol during bolus administration of intravenous contrast. CONTRAST:  1060mISOVUE-300 IOPAMIDOL (ISOVUE-300) INJECTION 61% COMPARISON:  None. FINDINGS: CT CHEST FINDINGS Chest wall: No chest wall mass, supraclavicular or axillary lymphadenopathy. Small scattered lymph nodes are noted. The thyroid gland appears normal. Cardiovascular: The heart is normal in size. No pericardial effusion. Mild tortuosity of the thoracic aorta but no aneurysm or dissection. The branch vessels patent. No definite coronary artery  calcifications. Mediastinum/Nodes: No mediastinal or hilar mass or adenopathy. The esophagus is normal. Lungs/Pleura: No acute pulmonary findings. No worrisome pulmonary lesions to suggest metastatic disease. No pleural effusion. Musculoskeletal: No significant bony findings. CT ABDOMEN PELVIS FINDINGS Hepatobiliary: No focal hepatic lesions to suggest metastatic disease. The gallbladder is normal. No common bile duct dilatation. The portal and hepatic veins are patent. Pancreas: No mass, inflammation or ductal dilatation. Spleen: Normal size.  No focal lesions. Adrenals/Urinary Tract: The adrenal glands and kidneys are unremarkable. Bladder is normal. Stomach/Bowel: The stomach, duodenum, small bowel and colon are unremarkable. No inflammatory changes, mass lesions or obstructive findings. Bilateral inguinal hernias, right much larger than left both containing small bowel loops but no obstruction or incarceration. There is a large irregular enhancing rectal mass measuring approximately 7.6 x 5.6 x 4.3 cm. Anal involvement is possible. No involvement of the ischiorectal fossa. There are prominent perirectal vessels and small scattered perirectal lymph nodes. The largest measures 7 mm on the left side on image number 119. No inguinal adenopathy. No definite adenopathy along the sigmoid mesocolon for retroperitoneum. Vascular/Lymphatic: The aorta is normal  in caliber. No dissection. The branch vessels are patent. The major venous structures are patent. No mesenteric or retroperitoneal mass or adenopathy. Small scattered lymph nodes are noted. Reproductive: The prostate gland and seminal vesicles are unremarkable. Other: No free pelvic fluid collections. No abdominal wall hernia or subcutaneous lesions. Musculoskeletal: No significant bony findings. IMPRESSION: Large irregular enhancing rectal mass consistent with known rectal cancer. There are small perirectal and small pelvic sidewall lymph nodes which are  suspicious. Prominent perirectal vessels are also noted. No evidence of metastatic retroperitoneal or sigmoid mesocolon adenopathy or hepatic or pulmonary metastasis. Electronically Signed   By: Marijo Sanes M.D.   On: 10/13/2016 13:48   COLONOSOCPY 10/09/2016 Dr. Hilarie Fredrickson  A fungating, infiltrative and ulcerated partially obstructing large mass was found in the distal rectum. The mass was circumferential. The mass measured seven cm in length (prolapsing and from dentate line to approx 7 cm). In addition, its inner diameter measured nine mm. Oozing was present. This was biopsied with a cold forceps for histology. - Due to the distal rectal mass the bowel preparation was poor and unable to be cleared via the adult upper endoscope (adult upper endoscope used due to obstructing mass). Scope not advanced proximal to the rectosigmoid colon.  ASSESSMENT & PLAN: 61 year old gentleman, previously healthy, presented with a anal mass and a mild intermittent rectal bleeding for 5-6 months. Fatigue, anorexia, fatigue and weight loss for a month.   1) Rectal Adenocarcinoma, low rectum, [id number], stage IIIB, MSI-pending  -I reviewed his CT and MRI scan, colonoscopy and biopsy findings in detail with pt and his family members -Based on his imaging findings, he has clinical stage III disease. We reviewed his image in our GI tumor Board this morning, he also has a small liver lesion which is indeterminate, likely benign, and we will follow up with a repeated CT in about 3 months.  -We discussed the nature history of rectal cancer, high risk of cancer recurrence for locally advanced disease, and the standard therapy for stage III disease, which is neoadjuvant chemotherapy and irradiation, followed by definitive surgery (likely APR per Dr. Johney Maine), and adjuvant chemo -We discussed the chemo options with concurrent RT, including Xeloda 831m/m2, which he would take twice a day on days he receives radiation, or continuous  5-FU infusion.  --Chemotherapy consent: Side effects including but does not not limited to, fatigue, nausea, vomiting, diarrhea, hair loss, neuropathy, fluid retention, renal and kidney dysfunction, neutropenic fever, needed for blood transfusion, bleeding, coronary artery spasm, heart attack and heart failure, were discussed with patient in great detail. He agrees to proceed. The patient would like to proceed with Xeloda. -The goal of therapy is curative. -The pateint has been encouraged to attend a chemotherapy education class. -We will monitor the patients blood counts and liver function once weekly with labs during his neoadjuvant chemoRT  -If the patient has a fever of >100.4, particularly with chills, he knows he should contact the clinic or present to the emergency room. -The patient will follow closely with radiation oncology and medical oncology. -Repeat CT scan or MRI in 3 months to monitor liver lesion.  2) Weight loss and dietary concerns. -I will refer the patient to speak BErnestene Kielin Nutrition. -I encouraged the patient to increase his calorie and protein intake. He will stay well hydrated. -I encouraged the patient to maintain regular exercise as he is able to improve his energy levels.  3) microcytic anemia, likely anemia of iron deficiency. Family history of  hemachromatosis -We discussed that the patient's anemia could be playing a role in his fatigue. -Hemoglobin was 8.3 a few weeks ago with low MCV, likely anemia of iron deficiency from chronic blood loss. 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 -if his iron level is truly low, will consider iron supplement, I'll be cautious about the duration of his iron supplement if he does have hemochromatosis  Plan -Referral to Nutrition -We will check iron level, liver function, and repeat blood counts this week. He will have labs weekly from 3/12 on  during chemotherapy and radiation. -I will order genetic testing to ruled out hemochromatosis  -I will prescribe Xeloda. -The patient will register for a chemotherapy education class this week. -Repeat CT scan or MRI in 3 months to monitor the small liver lesion. -The patient will return for follow up on 3/12 when he starts chemoRT   No orders of the defined types were placed in this encounter.   All questions were answered. The patient knows to call the clinic with any problems, questions or concerns. I spent 55 minutes counseling the patient face to face. The total time spent in the appointment was 60 minutes and more than 50% was on counseling.  This document serves as a record of services personally performed by Truitt Merle, MD. It was created on her behalf by Maryla Morrow, a trained medical scribe. The creation of this record is based on the scribe's personal observations and the provider's statements to them. This document has been checked and approved by the attending provider.     Truitt Merle, MD 10/25/2016

## 2016-10-25 ENCOUNTER — Telehealth: Payer: Self-pay | Admitting: *Deleted

## 2016-10-25 ENCOUNTER — Encounter: Payer: Self-pay | Admitting: Radiation Oncology

## 2016-10-25 ENCOUNTER — Telehealth: Payer: Self-pay | Admitting: Hematology

## 2016-10-25 ENCOUNTER — Encounter: Payer: Self-pay | Admitting: Hematology

## 2016-10-25 ENCOUNTER — Ambulatory Visit (HOSPITAL_BASED_OUTPATIENT_CLINIC_OR_DEPARTMENT_OTHER): Payer: BLUE CROSS/BLUE SHIELD | Admitting: Hematology

## 2016-10-25 ENCOUNTER — Ambulatory Visit
Admission: RE | Admit: 2016-10-25 | Discharge: 2016-10-25 | Disposition: A | Discharge status: Home / Self Care | Payer: BLUE CROSS/BLUE SHIELD | Source: Ambulatory Visit | Attending: Radiation Oncology | Admitting: Radiation Oncology

## 2016-10-25 VITALS — BP 119/73 | HR 95 | Temp 99.5°F

## 2016-10-25 VITALS — BP 131/80 | HR 76 | Temp 97.9°F | Resp 18 | Ht 72.0 in | Wt 138.7 lb

## 2016-10-25 DIAGNOSIS — Z510 Encounter for antineoplastic radiation therapy: Secondary | ICD-10-CM | POA: Diagnosis present

## 2016-10-25 DIAGNOSIS — D509 Iron deficiency anemia, unspecified: Secondary | ICD-10-CM | POA: Diagnosis not present

## 2016-10-25 DIAGNOSIS — Z87891 Personal history of nicotine dependence: Secondary | ICD-10-CM | POA: Insufficient documentation

## 2016-10-25 DIAGNOSIS — Z832 Family history of diseases of the blood and blood-forming organs and certain disorders involving the immune mechanism: Secondary | ICD-10-CM | POA: Diagnosis not present

## 2016-10-25 DIAGNOSIS — C20 Malignant neoplasm of rectum: Secondary | ICD-10-CM

## 2016-10-25 DIAGNOSIS — Z79899 Other long term (current) drug therapy: Secondary | ICD-10-CM | POA: Diagnosis not present

## 2016-10-25 DIAGNOSIS — Z882 Allergy status to sulfonamides status: Secondary | ICD-10-CM | POA: Insufficient documentation

## 2016-10-25 DIAGNOSIS — R634 Abnormal weight loss: Secondary | ICD-10-CM | POA: Diagnosis not present

## 2016-10-25 DIAGNOSIS — Z825 Family history of asthma and other chronic lower respiratory diseases: Secondary | ICD-10-CM | POA: Insufficient documentation

## 2016-10-25 DIAGNOSIS — R5383 Other fatigue: Secondary | ICD-10-CM | POA: Diagnosis not present

## 2016-10-25 DIAGNOSIS — Z823 Family history of stroke: Secondary | ICD-10-CM | POA: Diagnosis not present

## 2016-10-25 DIAGNOSIS — Z8042 Family history of malignant neoplasm of prostate: Secondary | ICD-10-CM | POA: Diagnosis not present

## 2016-10-25 DIAGNOSIS — Z8349 Family history of other endocrine, nutritional and metabolic diseases: Secondary | ICD-10-CM | POA: Insufficient documentation

## 2016-10-25 DIAGNOSIS — D500 Iron deficiency anemia secondary to blood loss (chronic): Secondary | ICD-10-CM

## 2016-10-25 HISTORY — DX: Malignant (primary) neoplasm, unspecified: C80.1

## 2016-10-25 NOTE — Telephone Encounter (Signed)
Patient called to inform YF/ desk nurse that he is running behind on his appointment. At the time of call, patient was in radiation oncology with Dr. Lisbeth Renshaw. LVM and in-basket message for desk nurse and YF with patient's concerns of having to reschedule appointment.

## 2016-10-25 NOTE — Progress Notes (Signed)
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   HISTORY OF PRESENT ILLNESS: David Clarke is a 61 y.o. male seen at the request of Dr. Johney Maine. Patient noticed a mass accompanied by intermittent bleeding and pain in the rectum 5 months ago. The patient was seen by Dr. Hilarie Fredrickson for a colonoscopy and biopsy on 10/09/16. Biopsy of the rectal mass revealed adenocarcinoma. CT of the chest, abdomen, and pelvis was performed on 10/13/16. This revealed a large, irregular, enhancing mass consistent with known rectal cancer. There were several suspicious small perirectal and small pelvic sidewall lymph nodes. Prominent perirectal vessels were also noted. MRI of the pelvis on 10/24/16 showed large mass centered at the anus with extension into the low rectum without obstruction. There was suspicious mild mesorectal adenopathy, and a right inguinal hernia containing nonobstructive small bowel. Patient presents today to discuss the role of radiation therapy as part of his treatment.   PREVIOUS RADIATION THERAPY: No   PAST MEDICAL HISTORY:  Past Medical History:  Diagnosis Date  . Allergy   . Cancer ([place]) 10/09/2016   rectal adenocarcinoma       PAST SURGICAL HISTORY: Past Surgical History:  Procedure Laterality Date  . COLONOSCOPY W/ BIOPSIES Bilateral 10/09/2016   rectal adenocarcinoma  . MOUTH SURGERY     age 70, to remove extra "eye" teeth  . [place] teeth extraction       FAMILY HISTORY:  Family History  Problem Relation Age of Onset  . Other Mother     bile duct tumor  . Stroke Mother   . Prostate cancer Father   . Emphysema Father   . Colon cancer Neg Hx   . Esophageal cancer Neg Hx   . Pancreatic cancer Neg Hx   . Rectal cancer Neg  Hx   . Stomach cancer Neg Hx      SOCIAL HISTORY:  reports that he quit smoking about 16 years ago. His smoking use included Cigarettes. He quit after 2.00 years of use. He does not have any smokeless tobacco history on file. He reports that he drinks about 6.0 oz of alcohol per week . He reports that he does not use drugs.   ALLERGIES: Sulfa antibiotics   MEDICATIONS:  No current outpatient prescriptions on file.   Current Facility-Administered Medications  Medication Dose Route Frequency Provider Last Rate Last Dose  . 0.9 %  sodium chloride infusion  500 mL Intravenous Continuous Jerene Bears, MD         REVIEW OF SYSTEMS: On review of systems, the patient reports that he is doing well overall. He denies any chest pain, shortness of breath, cough, fevers, chills, or night sweats. Patient notes a 13-15 lb weight loss since December. He reports discharge, bleeding at night with bowel movement accompanied by pain, and wearing pads. He denies nausea or vomiting. He denies any new musculoskeletal or joint aches or pains. A complete review of systems is obtained and is otherwise negative.  PHYSICAL EXAM:  Wt Readings from Last 3 Encounters:  10/24/16 137 lb (62.1 kg)  10/09/16 137 lb (62.1 kg)  10/05/16 137 lb (62.1 kg)   Temp Readings from Last 3 Encounters:  10/09/16 98 F (36.7 C)  10/03/16 97.5 F (36.4  C) (Oral)   BP Readings from Last 3 Encounters:  10/09/16 120/72  10/05/16 120/68  10/03/16 119/80   Pulse Readings from Last 3 Encounters:  10/09/16 77  10/05/16 82  10/03/16 87    /10  In general this is a well appearing caucasian gentleman in no acute distress. He is alert and oriented x4 and appropriate throughout the examination. HEENT reveals that the patient is normocephalic, atraumatic. EOMs are intact. PERRLA. Skin is intact without any evidence of gross lesions. Cardiovascular exam reveals a regular rate and rhythm, no clicks rubs or murmurs are auscultated.  Chest is clear to auscultation bilaterally. Lymphatic assessment is performed and does not reveal any adenopathy in the cervical, supraclavicular, axillary, or inguinal chains. Abdomen has active bowel sounds in all quadrants and is intact. The abdomen is soft, non tender, non distended. Lower extremities are negative for pretibial pitting edema, deep calf tenderness, cyanosis or clubbing.   ECOG = 1  0 - Asymptomatic (Fully active, able to carry on all predisease activities without restriction)  1 - Symptomatic but completely ambulatory (Restricted in physically strenuous activity but ambulatory and able to carry out work of a light or sedentary nature. For example, light housework, office work)  2 - Symptomatic, <50% in bed during the day (Ambulatory and capable of all self care but unable to carry out any work activities. Up and about more than 50% of waking hours)  3 - Symptomatic, >50% in bed, but not bedbound (Capable of only limited self-care, confined to bed or chair 50% or more of waking hours)  4 - Bedbound (Completely disabled. Cannot carry on any self-care. Totally confined to bed or chair)  5 - Death   Eustace Pen MM, Creech RH, Tormey DC, et al. [phone]). "Toxicity and response criteria of the [facility]". [place] Oncol. 5 (6): 649-55    LABORATORY DATA:  Lab Results  Component Value Date   WBC 10.7 (H) 10/05/2016   HGB 8.3 (L) 10/05/2016   HCT 26.1 (L) 10/05/2016   MCV 73.6 (L) 10/05/2016   PLT 630.0 (H) 10/05/2016   Lab Results  Component Value Date   NA 136 10/05/2016   K 4.1 10/05/2016   CL 100 10/05/2016   CO2 28 10/05/2016   No results found for: ALT, AST, GGT, ALKPHOS, BILITOT    RADIOGRAPHY: Ct Chest W Contrast  Result Date: 10/13/2016 CLINICAL DATA:  Newly diagnosed rectal cancer. Rectal bleeding for several months. EXAM: CT CHEST, ABDOMEN, AND PELVIS WITH CONTRAST TECHNIQUE: Multidetector CT imaging of the chest, abdomen and pelvis  was performed following the standard protocol during bolus administration of intravenous contrast. CONTRAST:  139mL ISOVUE-300 IOPAMIDOL (ISOVUE-300) INJECTION 61% COMPARISON:  None. FINDINGS: CT CHEST FINDINGS Chest wall: No chest wall mass, supraclavicular or axillary lymphadenopathy. Small scattered lymph nodes are noted. The thyroid gland appears normal. Cardiovascular: The heart is normal in size. No pericardial effusion. Mild tortuosity of the thoracic aorta but no aneurysm or dissection. The branch vessels patent. No definite coronary artery calcifications. Mediastinum/Nodes: No mediastinal or hilar mass or adenopathy. The esophagus is normal. Lungs/Pleura: No acute pulmonary findings. No worrisome pulmonary lesions to suggest metastatic disease. No pleural effusion. Musculoskeletal: No significant bony findings. CT ABDOMEN PELVIS FINDINGS Hepatobiliary: No focal hepatic lesions to suggest metastatic disease. The gallbladder is normal. No common bile duct dilatation. The portal and hepatic veins are patent. Pancreas: No mass, inflammation or ductal dilatation. Spleen: Normal size.  No focal lesions. Adrenals/Urinary Tract:  The adrenal glands and kidneys are unremarkable. Bladder is normal. Stomach/Bowel: The stomach, duodenum, small bowel and colon are unremarkable. No inflammatory changes, mass lesions or obstructive findings. Bilateral inguinal hernias, right much larger than left both containing small bowel loops but no obstruction or incarceration. There is a large irregular enhancing rectal mass measuring approximately 7.6 x 5.6 x 4.3 cm. Anal involvement is possible. No involvement of the ischiorectal fossa. There are prominent perirectal vessels and small scattered perirectal lymph nodes. The largest measures 7 mm on the left side on image number 119. No inguinal adenopathy. No definite adenopathy along the sigmoid mesocolon for retroperitoneum. Vascular/Lymphatic: The aorta is normal in caliber. No  dissection. The branch vessels are patent. The major venous structures are patent. No mesenteric or retroperitoneal mass or adenopathy. Small scattered lymph nodes are noted. Reproductive: The prostate gland and seminal vesicles are unremarkable. Other: No free pelvic fluid collections. No abdominal wall hernia or subcutaneous lesions. Musculoskeletal: No significant bony findings. IMPRESSION: Large irregular enhancing rectal mass consistent with known rectal cancer. There are small perirectal and small pelvic sidewall lymph nodes which are suspicious. Prominent perirectal vessels are also noted. No evidence of metastatic retroperitoneal or sigmoid mesocolon adenopathy or hepatic or pulmonary metastasis. Electronically Signed   By: Marijo Sanes M.D.   On: 10/13/2016 13:48   Mr Pelvis W Wo Contrast  Result Date: 10/25/2016 CLINICAL DATA:  Anal/rectal mass. Rectal bleeding. Enlarging over the last 5 months. EXAM: MRI PELVIS WITHOUT AND WITH CONTRAST TECHNIQUE: Multiplanar multisequence MR imaging of the pelvis was performed both before and after administration of intravenous contrast. CONTRAST:  33mL MULTIHANCE GADOBENATE DIMEGLUMINE 529 MG/ML IV SOLN COMPARISON:  CT of 10/13/2016 FINDINGS: Moderate motion degradation, especially involving the postcontrast images. Urinary Tract:  Normal urinary bladder.  No distal hydroureter. Bowel: Small bowel positioned within a right inguinal hernia. No obstruction. No colonic obstruction. A circumferential mass involving the anus and low rectum. Measures up to 8.5 cm craniocaudal on image 29/series 5. Is intimately associated with the external sphincter, with apparent extension to the anal introitus. No gross extension into the ischioanal fossa. No extension into the perirectal fat in its superior most portion. Vascular/Lymphatic: No pelvic aneurysm. A 7 mm node in the 4 o'clock position of the mesorectum is suspicious. Example image 46/series 6. Reproductive:  Grossly  normal prostate.  Small bilateral hydroceles. Other:  No significant free fluid. Musculoskeletal: No acute osseous abnormality. IMPRESSION: 1. Moderately motion degraded exam. 2. Large mass is centered at the anus with extension into the low rectum. No complicating obstruction. 3. Mild mesorectal adenopathy, suspicious. 4. Right inguinal hernia containing nonobstructive small bowel. Electronically Signed   By: Abigail Miyamoto M.D.   On: 10/25/2016 07:37   Ct Abdomen Pelvis W Contrast  Result Date: 10/13/2016 CLINICAL DATA:  Newly diagnosed rectal cancer. Rectal bleeding for several months. EXAM: CT CHEST, ABDOMEN, AND PELVIS WITH CONTRAST TECHNIQUE: Multidetector CT imaging of the chest, abdomen and pelvis was performed following the standard protocol during bolus administration of intravenous contrast. CONTRAST:  123mL ISOVUE-300 IOPAMIDOL (ISOVUE-300) INJECTION 61% COMPARISON:  None. FINDINGS: CT CHEST FINDINGS Chest wall: No chest wall mass, supraclavicular or axillary lymphadenopathy. Small scattered lymph nodes are noted. The thyroid gland appears normal. Cardiovascular: The heart is normal in size. No pericardial effusion. Mild tortuosity of the thoracic aorta but no aneurysm or dissection. The branch vessels patent. No definite coronary artery calcifications. Mediastinum/Nodes: No mediastinal or hilar mass or adenopathy. The esophagus is normal. Lungs/Pleura:  No acute pulmonary findings. No worrisome pulmonary lesions to suggest metastatic disease. No pleural effusion. Musculoskeletal: No significant bony findings. CT ABDOMEN PELVIS FINDINGS Hepatobiliary: No focal hepatic lesions to suggest metastatic disease. The gallbladder is normal. No common bile duct dilatation. The portal and hepatic veins are patent. Pancreas: No mass, inflammation or ductal dilatation. Spleen: Normal size.  No focal lesions. Adrenals/Urinary Tract: The adrenal glands and kidneys are unremarkable. Bladder is normal. Stomach/Bowel:  The stomach, duodenum, small bowel and colon are unremarkable. No inflammatory changes, mass lesions or obstructive findings. Bilateral inguinal hernias, right much larger than left both containing small bowel loops but no obstruction or incarceration. There is a large irregular enhancing rectal mass measuring approximately 7.6 x 5.6 x 4.3 cm. Anal involvement is possible. No involvement of the ischiorectal fossa. There are prominent perirectal vessels and small scattered perirectal lymph nodes. The largest measures 7 mm on the left side on image number 119. No inguinal adenopathy. No definite adenopathy along the sigmoid mesocolon for retroperitoneum. Vascular/Lymphatic: The aorta is normal in caliber. No dissection. The branch vessels are patent. The major venous structures are patent. No mesenteric or retroperitoneal mass or adenopathy. Small scattered lymph nodes are noted. Reproductive: The prostate gland and seminal vesicles are unremarkable. Other: No free pelvic fluid collections. No abdominal wall hernia or subcutaneous lesions. Musculoskeletal: No significant bony findings. IMPRESSION: Large irregular enhancing rectal mass consistent with known rectal cancer. There are small perirectal and small pelvic sidewall lymph nodes which are suspicious. Prominent perirectal vessels are also noted. No evidence of metastatic retroperitoneal or sigmoid mesocolon adenopathy or hepatic or pulmonary metastasis. Electronically Signed   By: Marijo Sanes M.D.   On: 10/13/2016 13:48       IMPRESSION/PLAN: 24. 61 year old gentleman with adenocarcinoma of the rectum. Clinically, this represents a T3 N1 M0 adenocarcinoma of the rectum. 2. We had an abbreviated meeting today due to the patient's appointment with chemotherapy at 12:00 pm. I advised the patient to arrive at CT simulation 30 minutes early to further discuss the role of radiation. Anticipate CT simulation and treatment planning to be scheduled for 10/27/16.  Anticipate 6 weeks of radiation with concurrent chemotherapy, then followed by definitive surgery. Based on the location, I anticipate that this would require an APR. He will discuss further with surgery.    The patient was seen today for 30 minutes, with the majority of the time spent counseling the patient on his diagnosis of cancer and coordinating his care. ------------------------------------------------  Jodelle Gross, MD, PhD  This document serves as a record of services personally performed by Kyung Rudd, MD. It was created on his behalf by Bethann Humble, a trained medical scribe. The creation of this record is based on the scribe's personal observations and the provider's statements to them. This document has been checked and approved by the attending provider.

## 2016-10-25 NOTE — Telephone Encounter (Signed)
CALLED PATIENT TO INFORM OF [facility] APPT. ON 10-27-16, LVM FOR A RETURN CALL

## 2016-10-25 NOTE — Progress Notes (Signed)
Please see the Nurse Progress Note in the MD Initial Consult Encounter for this patient. 

## 2016-10-25 NOTE — Telephone Encounter (Signed)
Left vm on Dr. Ernestina Penna RN phone, patient still with Dr. Lisbeth Renshaw, will be up shortly, escorted patient and family tolobby  At 1207pm 12:16 PM

## 2016-10-26 ENCOUNTER — Other Ambulatory Visit: Payer: BLUE CROSS/BLUE SHIELD

## 2016-10-26 MED ORDER — CAPECITABINE 500 MG PO TABS: 825.0000 mg/m2 | ORAL_TABLET | Freq: Two times a day (BID) | ORAL | 0 refills | Status: DC

## 2016-10-26 NOTE — Addendum Note (Signed)
Addended by: Truitt Merle on: 10/26/2016 04:42 PM   Modules accepted: Orders

## 2016-10-27 ENCOUNTER — Ambulatory Visit
Admission: RE | Admit: 2016-10-27 | Discharge: 2016-10-27 | Disposition: A | Discharge status: Home / Self Care | Payer: BLUE CROSS/BLUE SHIELD | Source: Ambulatory Visit | Attending: Radiation Oncology | Admitting: Radiation Oncology

## 2016-10-27 ENCOUNTER — Ambulatory Visit: Payer: BLUE CROSS/BLUE SHIELD

## 2016-10-27 ENCOUNTER — Telehealth: Payer: Self-pay | Admitting: Pharmacist

## 2016-10-27 ENCOUNTER — Other Ambulatory Visit (HOSPITAL_BASED_OUTPATIENT_CLINIC_OR_DEPARTMENT_OTHER): Payer: BLUE CROSS/BLUE SHIELD

## 2016-10-27 ENCOUNTER — Encounter: Payer: Self-pay | Admitting: Radiation Oncology

## 2016-10-27 DIAGNOSIS — C20 Malignant neoplasm of rectum: Secondary | ICD-10-CM | POA: Diagnosis not present

## 2016-10-27 DIAGNOSIS — Z510 Encounter for antineoplastic radiation therapy: Secondary | ICD-10-CM | POA: Diagnosis not present

## 2016-10-27 DIAGNOSIS — D500 Iron deficiency anemia secondary to blood loss (chronic): Secondary | ICD-10-CM

## 2016-10-27 DIAGNOSIS — Z8349 Family history of other endocrine, nutritional and metabolic diseases: Secondary | ICD-10-CM

## 2016-10-27 LAB — CBC WITH DIFFERENTIAL/PLATELET
BASO%: 0.6 % (ref 0.0–2.0)
BASOS ABS: 0.1 10*3/uL (ref 0.0–0.1)
EOS ABS: 0.2 10*3/uL (ref 0.0–0.5)
EOS%: 1.9 % (ref 0.0–7.0)
HCT: 25 % — ABNORMAL LOW (ref 38.4–49.9)
HEMOGLOBIN: 7.3 g/dL — AB (ref 13.0–17.1)
LYMPH%: 16.3 % (ref 14.0–49.0)
MCH: 21.6 pg — AB (ref 27.2–33.4)
MCHC: 29.2 g/dL — ABNORMAL LOW (ref 32.0–36.0)
MCV: 74 fL — AB (ref 79.3–98.0)
MONO#: 0.8 10*3/uL (ref 0.1–0.9)
MONO%: 10.4 % (ref 0.0–14.0)
NEUT#: 5.6 10*3/uL (ref 1.5–6.5)
NEUT%: 70.8 % (ref 39.0–75.0)
Platelets: 390 10*3/uL (ref 140–400)
RBC: 3.38 10*6/uL — AB (ref 4.20–5.82)
RDW: 15.7 % — AB (ref 11.0–14.6)
WBC: 8 10*3/uL (ref 4.0–10.3)
lymph#: 1.3 10*3/uL (ref 0.9–3.3)

## 2016-10-27 LAB — COMPREHENSIVE METABOLIC PANEL
ALT: 11 U/L (ref 0–55)
AST: 14 U/L (ref 5–34)
Albumin: 3.2 g/dL — ABNORMAL LOW (ref 3.5–5.0)
Alkaline Phosphatase: 103 U/L (ref 40–150)
Anion Gap: 9 mEq/L (ref 3–11)
BUN: 13.1 mg/dL (ref 7.0–26.0)
CHLORIDE: 103 meq/L (ref 98–109)
CO2: 25 meq/L (ref 22–29)
Calcium: 9 mg/dL (ref 8.4–10.4)
Creatinine: 0.8 mg/dL (ref 0.7–1.3)
GLUCOSE: 99 mg/dL (ref 70–140)
POTASSIUM: 3.9 meq/L (ref 3.5–5.1)
SODIUM: 137 meq/L (ref 136–145)
Total Bilirubin: 0.32 mg/dL (ref 0.20–1.20)
Total Protein: 7 g/dL (ref 6.4–8.3)

## 2016-10-27 LAB — IRON AND TIBC
%SAT: 4 % — ABNORMAL LOW (ref 20–55)
Iron: 13 ug/dL — ABNORMAL LOW (ref 42–163)
TIBC: 354 ug/dL (ref 202–409)
UIBC: 341 ug/dL (ref 117–376)

## 2016-10-27 LAB — CEA (IN HOUSE-CHCC): CEA (CHCC-IN HOUSE): 7.81 ng/mL — AB (ref 0.00–5.00)

## 2016-10-27 LAB — FERRITIN: Ferritin: 5 ng/ml — ABNORMAL LOW (ref 22–316)

## 2016-10-27 MED ORDER — CAPECITABINE 500 MG PO TABS: 825.0000 mg/m2 | ORAL_TABLET | Freq: Two times a day (BID) | ORAL | 0 refills | Status: DC

## 2016-10-27 NOTE — Telephone Encounter (Signed)
Oral Chemotherapy Pharmacist Encounter  Received new prescription for Xeloda for patient to be taken with concurrent radiation for rectal cancer Labs from today (10/27/16) reviewed, OK for treatment, MD aware of low Hgb Current medication list in Epic assessed, no DDIs with Xeloda identified. Noted initial counseling performed today by chemo education RN.  PA submitted on CoverMyMeds Key [id number] Status is approved Effective dates: 10/27/16-10/26/17  Per patient's insurance, prescription will be e-scribed to [facility] will continue to follow.  Johny Drilling, PharmD, BCPS, BCOP 10/27/2016  4:03 PM Oral Oncology Clinic [phone]

## 2016-10-27 NOTE — Progress Notes (Signed)
FUNC  Rectal cancer, here to complete consult, has labs and chemo class today befofre ct simulation today No c/o pain or nausea BP 119/88 (BP Location: Left Arm, Patient Position: Sitting, Cuff Size: Normal)   Pulse 80   Temp 98 F (36.7 C) (Oral)   Resp 20   Ht 6' (1.829 m)   Wt 138 lb 3.2 oz (62.7 kg)   SpO2 100%   BMI 18.74 kg/m   Wt Readings from Last 3 Encounters:  10/27/16 138 lb 3.2 oz (62.7 kg)  10/25/16 138 lb 11.2 oz (62.9 kg)  10/24/16 137 lb (62.1 kg)

## 2016-10-27 NOTE — Progress Notes (Signed)
Please see the Nurse Progress Note in the MD Initial Consult Encounter for this patient. 

## 2016-10-30 ENCOUNTER — Telehealth: Payer: Self-pay | Admitting: *Deleted

## 2016-10-30 ENCOUNTER — Telehealth: Payer: Self-pay | Admitting: Hematology

## 2016-10-30 NOTE — Telephone Encounter (Signed)
-----   Message from Truitt Merle, MD sent at 10/27/2016  7:40 PM EST ----- Please let pt know that his iron level is very low, and his Hg is 7.3. Please let him start oral iron, ferrouls sulfate 1-2 tab daily, watch for constipation. I will set up his iv iron next week also. Thanks  Truitt Merle  10/27/2016

## 2016-10-30 NOTE — Telephone Encounter (Signed)
Patient called to confirm his appointment on Friday for and iron infusion

## 2016-10-30 NOTE — Telephone Encounter (Signed)
Called pt and left message on voice mail of Dr. Ernestina Penna instructions below.  Instructed pt to take oral iron Ferrous Sulfate  1 - 2 tabs daily, and to monitor for constipation.  Left message also for first iron infusion on Fri  11/03/16.

## 2016-10-30 NOTE — Telephone Encounter (Signed)
lvm to inform pt of 3/9 infusion per LOS

## 2016-10-31 ENCOUNTER — Telehealth: Payer: Self-pay | Admitting: Radiation Oncology

## 2016-10-31 NOTE — Telephone Encounter (Signed)
LM for pt to call back if he had questions about his replanning.

## 2016-11-01 ENCOUNTER — Ambulatory Visit
Admission: RE | Admit: 2016-11-01 | Discharge: 2016-11-01 | Disposition: A | Discharge status: Home / Self Care | Payer: BLUE CROSS/BLUE SHIELD | Source: Ambulatory Visit | Attending: Radiation Oncology | Admitting: Radiation Oncology

## 2016-11-01 ENCOUNTER — Other Ambulatory Visit: Payer: Self-pay | Admitting: Hematology

## 2016-11-01 DIAGNOSIS — C20 Malignant neoplasm of rectum: Secondary | ICD-10-CM

## 2016-11-01 LAB — HEMOCHROMATOSIS DNA-PCR(C282Y,H63D)

## 2016-11-01 NOTE — Telephone Encounter (Signed)
Oral Chemotherapy Pharmacist Encounter   I spoke with patient for overview of new oral chemotherapy medication: Xeloda. Pt is doing well. The prescription was sent to [facility] on 10/27/16, patient will call the pharmacy at [phone] to schedule delivery and discuss copayment information.   Counseled patient on administration, dosing, side effects, safe handling, and monitoring. Patient will take 3 tablets (1500mg  total) by mouth twice daily, within 30 minutes of a meal, on days of radiation only. Planned start date of radiation and Xeloda is 11/06/16.  Side effects include but not limited to: fatigue, hand-foot syndrome, N/V/D, decreased blood counts, and mucositis.  Mr. Bi voiced understanding and appreciation.   All questions answered.  Patient knows to call the office with questions or concerns.  Thank you,  Johny Drilling, PharmD, BCPS, BCOP 11/01/2016  10:44 AM Oral Oncology Clinic [phone]

## 2016-11-03 ENCOUNTER — Ambulatory Visit (HOSPITAL_BASED_OUTPATIENT_CLINIC_OR_DEPARTMENT_OTHER): Payer: BLUE CROSS/BLUE SHIELD

## 2016-11-03 VITALS — BP 113/75 | HR 83 | Temp 98.3°F | Resp 16

## 2016-11-03 DIAGNOSIS — D509 Iron deficiency anemia, unspecified: Secondary | ICD-10-CM | POA: Diagnosis not present

## 2016-11-03 DIAGNOSIS — D500 Iron deficiency anemia secondary to blood loss (chronic): Secondary | ICD-10-CM

## 2016-11-03 MED ORDER — SODIUM CHLORIDE 0.9 % IV SOLN
510.0000 mg | Freq: Once | INTRAVENOUS | Status: AC
  Administered 2016-11-03: 510 mg via INTRAVENOUS
  Filled 2016-11-03: qty 17

## 2016-11-03 MED ORDER — SODIUM CHLORIDE 0.9 % IV SOLN
Freq: Once | INTRAVENOUS | Status: AC
  Administered 2016-11-03: 09:00:00 via INTRAVENOUS

## 2016-11-03 NOTE — Patient Instructions (Signed)
Ferumoxytol injection What is this medicine? FERUMOXYTOL is an iron complex. Iron is used to make healthy red blood cells, which carry oxygen and nutrients throughout the body. This medicine is used to treat iron deficiency anemia in people with chronic kidney disease. This medicine may be used for other purposes; ask your health care provider or pharmacist if you have questions. COMMON BRAND NAME(S): Feraheme What should I tell my health care provider before I take this medicine? They need to know if you have any of these conditions: -anemia not caused by low iron levels -high levels of iron in the blood -magnetic resonance imaging (MRI) test scheduled -an unusual or allergic reaction to iron, other medicines, foods, dyes, or preservatives -pregnant or trying to get pregnant -breast-feeding How should I use this medicine? This medicine is for injection into a vein. It is given by a health care professional in a hospital or clinic setting. Talk to your pediatrician regarding the use of this medicine in children. Special care may be needed. Overdosage: If you think you have taken too much of this medicine contact a poison control center or emergency room at once. NOTE: This medicine is only for you. Do not share this medicine with others. What if I miss a dose? It is important not to miss your dose. Call your doctor or health care professional if you are unable to keep an appointment. What may interact with this medicine? This medicine may interact with the following medications: -other iron products This list may not describe all possible interactions. Give your health care provider a list of all the medicines, herbs, non-prescription drugs, or dietary supplements you use. Also tell them if you smoke, drink alcohol, or use illegal drugs. Some items may interact with your medicine. What should I watch for while using this medicine? Visit your doctor or healthcare professional regularly. Tell  your doctor or healthcare professional if your symptoms do not start to get better or if they get worse. You may need blood work done while you are taking this medicine. You may need to follow a special diet. Talk to your doctor. Foods that contain iron include: whole grains/cereals, dried fruits, beans, or peas, leafy green vegetables, and organ meats (liver, kidney). What side effects may I notice from receiving this medicine? Side effects that you should report to your doctor or health care professional as soon as possible: -allergic reactions like skin rash, itching or hives, swelling of the face, lips, or tongue -breathing problems -changes in blood pressure -feeling faint or lightheaded, falls -fever or chills -flushing, sweating, or hot feelings -swelling of the ankles or feet Side effects that usually do not require medical attention (report to your doctor or health care professional if they continue or are bothersome): -diarrhea -headache -nausea, vomiting -stomach pain This list may not describe all possible side effects. Call your doctor for medical advice about side effects. You may report side effects to FDA at 1-800-FDA-1088. Where should I keep my medicine? This drug is given in a hospital or clinic and will not be stored at home. NOTE: This sheet is a summary. It may not cover all possible information. If you have questions about this medicine, talk to your doctor, pharmacist, or health care provider.  2018 Elsevier/Gold Standard (2015-09-16 12:41:49)    Iron Deficiency Anemia, Adult Iron deficiency anemia is a condition in which the concentration of red blood cells or hemoglobin in the blood is below normal because of too little iron. Hemoglobin is  a substance in red blood cells that carries oxygen to the body's tissues. When the concentration of red blood cells or hemoglobin is too low, not enough oxygen reaches these tissues. Iron deficiency anemia is usually  long-lasting (chronic) and it develops over time. It may or may not cause symptoms. It is a common type of anemia. What are the causes? This condition may be caused by:  Not enough iron in the diet.  Blood loss caused by bleeding in the intestine.  Blood loss from a gastrointestinal condition like Crohn disease.  Frequent blood draws, such as from blood donation.  Abnormal absorption in the gut.  Heavy menstrual periods in women.  Cancers of the gastrointestinal system, such as colon cancer. What are the signs or symptoms? Symptoms of this condition may include:  Fatigue.  Headache.  Pale skin, lips, and nail beds.  Poor appetite.  Weakness.  Shortness of breath.  Dizziness.  Cold hands and feet.  Fast or irregular heartbeat.  Irritability. This is more common in severe anemia.  Rapid breathing. This is more common in severe anemia. Mild anemia may not cause any symptoms. How is this diagnosed? This condition is diagnosed based on:  Your medical history.  A physical exam.  Blood tests. You may have additional tests to find the underlying cause of your anemia, such as:  Testing for blood in the stool (fecal occult blood test).  A procedure to see inside your colon and rectum (colonoscopy).  A procedure to see inside your esophagus and stomach (endoscopy).  A test in which cells are removed from bone marrow (bone marrow aspiration) or fluid is removed from the bone marrow to be examined (biopsy). This is rarely needed. How is this treated? This condition is treated by correcting the cause of your iron deficiency. Treatment may involve:  Adding iron-rich foods to your diet.  Taking iron supplements. If you are pregnant or breastfeeding, you may need to take extra iron because your normal diet usually does not provide the amount of iron that you need.  Increasing vitamin C intake. Vitamin C helps your body absorb iron. Your health care provider may  recommend that you take iron supplements along with a glass of orange juice or a vitamin C supplement.  Medicines to make heavy menstrual flow lighter.  Surgery. You may need repeat blood tests to determine whether treatment is working. Depending on the underlying cause, the anemia should be corrected within 2 months of starting treatment. If the treatment does not seem to be working, you may need more testing. Follow these instructions at home: Medicines   Take over-the-counter and prescription medicines only as told by your health care provider. This includes iron supplements and vitamins.  If you cannot tolerate taking iron supplements by mouth, talk with your health care provider about taking them through a vein (intravenously) or an injection into a muscle.  For the best iron absorption, you should take iron supplements when your stomach is empty. If you cannot tolerate them on an empty stomach, you may need to take them with food.  Do not drink milk or take antacids at the same time as your iron supplements. Milk and antacids may interfere with iron absorption.  Iron supplements can cause constipation. To prevent constipation, include fiber in your diet as told by your health care provider. A stool softener may also be recommended. Eating and drinking   Talk with your health care provider before changing your diet. He or she may  recommend that you eat foods that contain a lot of iron, such as:  Liver.  Low-fat (lean) beef.  Breads and cereals that have iron added to them (are fortified).  Eggs.  Dried fruit.  Dark green, leafy vegetables.  To help your body use the iron from iron-rich foods, eat those foods at the same time as fresh fruits and vegetables that are high in vitamin C. Foods that are high in vitamin C include:  Oranges.  Peppers.  Tomatoes.  Mangoes.  Drinkenoughfluid to keep your urine clear or pale yellow. General instructions   Return to your  normal activities as told by your health care provider. Ask your health care provider what activities are safe for you.  Practice good hygiene. Anemia can make you more prone to illness and infection.  Keep all follow-up visits as told by your health care provider. This is important. Contact a health care provider if:  You feel nauseous or you vomit.  You feel weak.  You have unexplained sweating.  You develop symptoms of constipation, such as:  Having fewer than three bowel movements a week.  Straining to have a bowel movement.  Having stools that are hard, dry, or larger than normal.  Feeling full or bloated.  Pain in the lower abdomen.  Not feeling relief after having a bowel movement. Get help right away if:  You faint. If this happens, do not drive yourself to the hospital. Call your local emergency services (911 in the [place]  You have chest pain.  You have shortness of breath that:  Is severe.  Gets worse with physical activity.  You have a rapid heartbeat.  You become light-headed when getting up from a sitting or lying down position. This information is not intended to replace advice given to you by your health care provider. Make sure you discuss any questions you have with your health care provider. Document Released: 08/11/2000 Document Revised: 05/03/2016 Document Reviewed: 05/03/2016 Elsevier Interactive Patient Education  2017 [employer].

## 2016-11-06 ENCOUNTER — Telehealth: Payer: Self-pay | Admitting: *Deleted

## 2016-11-06 ENCOUNTER — Other Ambulatory Visit (HOSPITAL_BASED_OUTPATIENT_CLINIC_OR_DEPARTMENT_OTHER): Payer: BLUE CROSS/BLUE SHIELD

## 2016-11-06 ENCOUNTER — Ambulatory Visit
Admission: RE | Admit: 2016-11-06 | Payer: BLUE CROSS/BLUE SHIELD | Source: Ambulatory Visit | Admitting: Radiation Oncology

## 2016-11-06 DIAGNOSIS — Z510 Encounter for antineoplastic radiation therapy: Secondary | ICD-10-CM | POA: Diagnosis not present

## 2016-11-06 DIAGNOSIS — C20 Malignant neoplasm of rectum: Secondary | ICD-10-CM

## 2016-11-06 LAB — CBC WITH DIFFERENTIAL/PLATELET
BASO%: 0.6 % (ref 0.0–2.0)
Basophils Absolute: 0.1 10*3/uL (ref 0.0–0.1)
EOS%: 1.4 % (ref 0.0–7.0)
Eosinophils Absolute: 0.1 10*3/uL (ref 0.0–0.5)
HEMATOCRIT: 28.3 % — AB (ref 38.4–49.9)
HGB: 8.3 g/dL — ABNORMAL LOW (ref 13.0–17.1)
LYMPH#: 1.6 10*3/uL (ref 0.9–3.3)
LYMPH%: 18.4 % (ref 14.0–49.0)
MCH: 22.7 pg — ABNORMAL LOW (ref 27.2–33.4)
MCHC: 29.3 g/dL — AB (ref 32.0–36.0)
MCV: 77.3 fL — ABNORMAL LOW (ref 79.3–98.0)
MONO#: 0.8 10*3/uL (ref 0.1–0.9)
MONO%: 9.1 % (ref 0.0–14.0)
NEUT%: 70.5 % (ref 39.0–75.0)
NEUTROS ABS: 6.3 10*3/uL (ref 1.5–6.5)
Platelets: 464 10*3/uL — ABNORMAL HIGH (ref 140–400)
RBC: 3.66 10*6/uL — ABNORMAL LOW (ref 4.20–5.82)
RDW: 19.7 % — ABNORMAL HIGH (ref 11.0–14.6)
WBC: 8.9 10*3/uL (ref 4.0–10.3)

## 2016-11-06 LAB — COMPREHENSIVE METABOLIC PANEL
ALBUMIN: 3.2 g/dL — AB (ref 3.5–5.0)
ALK PHOS: 106 U/L (ref 40–150)
ALT: 10 U/L (ref 0–55)
AST: 14 U/L (ref 5–34)
Anion Gap: 9 mEq/L (ref 3–11)
BILIRUBIN TOTAL: 0.37 mg/dL (ref 0.20–1.20)
BUN: 12 mg/dL (ref 7.0–26.0)
CALCIUM: 9.3 mg/dL (ref 8.4–10.4)
CO2: 25 mEq/L (ref 22–29)
CREATININE: 0.9 mg/dL (ref 0.7–1.3)
Chloride: 103 mEq/L (ref 98–109)
EGFR: >90 mL/min/{1.73_m2} (ref >90)
GLUCOSE: 141 mg/dL — AB (ref 70–140)
POTASSIUM: 3.8 meq/L (ref 3.5–5.1)
Sodium: 137 mEq/L (ref 136–145)
TOTAL PROTEIN: 7 g/dL (ref 6.4–8.3)

## 2016-11-06 NOTE — Telephone Encounter (Signed)
Patient concerned because he ran a fever last night of 100.0.  He had it for just a little while and then it came down.  It was normal this am and has no fever now.  Dr. Burr Medico does not really think this is related to the iron.  I did remind him to start his xeloda on Wednesday.  It is to arrive tomorrow.  Also let him know that Dr. Burr Medico will see him when he comes this Friday.  He appreciated the call back.

## 2016-11-06 NOTE — Telephone Encounter (Signed)
"  I missed a call from Dr. Ernestina Penna nurse.  Please ask to to call me back." Message left with this request.

## 2016-11-07 ENCOUNTER — Encounter: Payer: Self-pay | Admitting: *Deleted

## 2016-11-07 ENCOUNTER — Ambulatory Visit: Payer: BLUE CROSS/BLUE SHIELD

## 2016-11-07 NOTE — Progress Notes (Signed)
[facility] Psychosocial Distress Screening Clinical Social Work  Clinical Social Work was referred by distress screening protocol.  The patient scored a 5 on the Psychosocial Distress Thermometer which indicates moderate distress. Clinical Social Worker contacted patient at home to offer support and assess for distress and other psychosocial needs.  Patient stated he was a "a little anxious" about starting treatment tomorrow, but felt positive about the treatment plan and treatment team.  CSw and patient discussed common feeling and concerns when going through treatment.  CSw shared information on the support team and support services at [facility].  Patient expressed interest in attending the GI support group.  CSW provided contact information and encouraged patient to call with questions or concerns.         ON[facility]N DISTRESS SCREENING 10/25/2016  Screening Type Initial Screening  Distress experienced in past week (1-10) 5  Practical problem type Housing;Insurance;Work/school  Emotional problem type Adjusting to illness  Physical Problem type Pain;Loss of appetitie  Physician notified of physical symptoms Yes  Referral to clinical social work Yes  Referral to dietition Yes    Johnnye Lana, MSW, LCSW, OSW-C Clinical Social Worker [place] [phone]

## 2016-11-08 ENCOUNTER — Ambulatory Visit
Admission: RE | Admit: 2016-11-08 | Discharge: 2016-11-08 | Disposition: A | Discharge status: Home / Self Care | Payer: BLUE CROSS/BLUE SHIELD | Source: Ambulatory Visit | Attending: Radiation Oncology | Admitting: Radiation Oncology

## 2016-11-08 ENCOUNTER — Encounter: Payer: Self-pay | Admitting: Nurse Practitioner

## 2016-11-08 ENCOUNTER — Ambulatory Visit (HOSPITAL_BASED_OUTPATIENT_CLINIC_OR_DEPARTMENT_OTHER): Payer: BLUE CROSS/BLUE SHIELD | Admitting: Nurse Practitioner

## 2016-11-08 ENCOUNTER — Ambulatory Visit: Payer: BLUE CROSS/BLUE SHIELD | Admitting: Radiation Oncology

## 2016-11-08 ENCOUNTER — Telehealth: Payer: Self-pay | Admitting: Hematology

## 2016-11-08 VITALS — BP 128/74 | HR 85 | Temp 98.0°F | Resp 18 | Ht 72.0 in | Wt 137.1 lb

## 2016-11-08 DIAGNOSIS — C20 Malignant neoplasm of rectum: Secondary | ICD-10-CM | POA: Diagnosis not present

## 2016-11-08 DIAGNOSIS — D500 Iron deficiency anemia secondary to blood loss (chronic): Secondary | ICD-10-CM | POA: Diagnosis not present

## 2016-11-08 DIAGNOSIS — Z510 Encounter for antineoplastic radiation therapy: Secondary | ICD-10-CM | POA: Diagnosis not present

## 2016-11-08 NOTE — Assessment & Plan Note (Signed)
Patient presented to the [facility] today to receive his first day of radiation treatments.  He was also scheduled to start his Xeloda oral therapy this morning; but held the first dose this morning secondary to having a fever last night.  Patient states that he had a mild URI infection approximate 3 weeks ago with some low-grade fevers.  He states that all of those symptoms completely cleared.  However, he has had a low-grade temperature to maximum 100.9 last night on 3 different occasions recently.  He denies any URI symptoms.  He feels he has a little bit of a hoarse voice today.  He denies any cough or UTI symptoms.  He denies any chills.  On exam today.  Lungs were clear bilaterally.  Posterior oropharynx is clear with no erythema or exudate.  Patient has no nasal congestion noted; but does have a mild hoarse voice.  Patient doesn't appear toxic.  Patient is afebrile today with temperature 98.0 and all other vital signs stable.  Dr. Burr Medico in to see patient as well; and has advised patient that he may be experiencing a low-grade, intermittent fever secondary to his untreated cancer.  Patient was advised to continue with his radiation treatments as scheduled.  He was also encouraged to start taking the Xeloda oral therapy tonight as planned.  Patient was advised to call/return or go directly to the emergency department for any worsening symptoms whatsoever.  Note: Since patient was seen at the [facility] today; will plan to cancel all labs on Monday, 11/13/2016.  Will change to labs and a follow-up visit here at the [facility] on Thursday, 11/16/2016 instead.  Also-patient will receive no further Feraheme for the time being.

## 2016-11-08 NOTE — Progress Notes (Signed)
SYMPTOM MANAGEMENT CLINIC    Chief Complaint: Fever, anemia  HPI:  David Clarke 61 y.o. male diagnosed with rectal cancer.  Initiated radiation treatments and Xeloda oral therapy today.   Oncology History   Cancer Staging Rectal adenocarcinoma  Staging form: Colon and Rectum, AJCC 8th Edition - Clinical stage from 10/09/2016: Stage IIIB (cT3, cN1b, cM0) - Signed by Truitt Merle, MD on 10/25/2016       Rectal adenocarcinoma    10/03/2016 - 10/03/2016 Hospital Admission    Admit date: 10/03/16 The patient presented to the [facility] ED with rectal bleeding and difficulty with hygiene.      10/09/2016 Initial Diagnosis    Rectal adenocarcinoma       10/09/2016 Procedure    Colonoscopy showed a fungating, infiltrative and ulcerated partially obstructing large mass in the distal rectum, measuring 9 cm, prolapsing at anal canal. Scope not advanced proximal to the rectosigmoid colon.      10/09/2016 Initial Biopsy    Rectal mass biopsy showed invasive adenocarcinoma.      10/13/2016 Imaging    CT Chest Abdomen Pelvis w contrast IMPRESSION: Large irregular enhancing rectal mass consistent with known rectal cancer. There are small perirectal and small pelvic sidewall lymph nodes which are suspicious. Prominent perirectal vessels are also noted. No evidence of metastatic retroperitoneal or sigmoid mesocolon adenopathy or hepatic or pulmonary metastasis.      10/24/2016 Imaging    MRI Pelvis IMPRESSION: 1. Moderately motion degraded exam. 2. Large mass is centered at the anus with extension into the low rectum. No complicating obstruction. 3. Mild mesorectal adenopathy, suspicious. 4. Right inguinal hernia containing nonobstructive small bowel.        Review of Systems  Constitutional: Positive for fever, malaise/fatigue and weight loss.  All other systems reviewed and are negative.   Past Medical History:  Diagnosis Date  . Allergy   . Cancer ([place]) 10/09/2016   rectal  adenocarcinoma    Past Surgical History:  Procedure Laterality Date  . COLONOSCOPY W/ BIOPSIES Bilateral 10/09/2016   rectal adenocarcinoma  . MOUTH SURGERY     age 70, to remove extra "eye" teeth  . [place] teeth extraction      has Rectal adenocarcinoma ; Rectal bleeding; Family history of hemochromatosis; and Iron deficiency anemia due to chronic blood loss on his problem list.    is allergic to sulfa antibiotics.  Allergies as of 11/08/2016      Reactions   Sulfa Antibiotics Nausea Only   Reaction may years ago, pt thinks nausea only.       Medication List       Accurate as of 11/08/16  1:05 PM. Always use your most recent med list.          acetaminophen 325 MG tablet Commonly known as:  TYLENOL Take 650 mg by mouth every 6 (six) hours as needed.   capecitabine 500 MG tablet Commonly known as:  XELODA Take 3 tablets (1,500 mg total) by mouth 2 (two) times daily after a meal.   SENOKOT PO Take 1 tablet by mouth as needed. [facility] brand        PHYSICAL EXAMINATION  Oncology Vitals 11/08/2016 11/03/2016  Height 183 cm -  Weight 62.188 kg -  Weight (lbs) 137 lbs 2 oz -  BMI (kg/m2) 18.59 kg/m2 -  Temp 98 98.3  Pulse 85 83  Resp 18 16  SpO2 100 100  BSA (m2) 1.78 m2 -   BP Readings from Last  2 Encounters:  11/08/16 128/74  11/03/16 113/75    Physical Exam  Constitutional: He is oriented to person, place, and time. He appears malnourished. He appears cachectic.  HENT:  Head: Normocephalic and atraumatic.  Mouth/Throat: Oropharynx is clear and moist.  Eyes: Conjunctivae and EOM are normal. Pupils are equal, round, and reactive to light. Right eye exhibits no discharge. Left eye exhibits no discharge. No scleral icterus.  Neck: Normal range of motion. Neck supple. No JVD present. No tracheal deviation present. No thyromegaly present.  Cardiovascular: Normal rate, regular rhythm, normal heart sounds and intact distal pulses.   Pulmonary/Chest: Effort normal and  breath sounds normal. No respiratory distress. He has no wheezes. He has no rales. He exhibits no tenderness.  Abdominal: Soft. Bowel sounds are normal. He exhibits no distension and no mass. There is no tenderness. There is no rebound and no guarding.  Musculoskeletal: Normal range of motion. He exhibits no edema, tenderness or deformity.  Lymphadenopathy:    He has no cervical adenopathy.  Neurological: He is alert and oriented to person, place, and time. Gait normal.  Skin: Skin is warm and dry. No rash noted. No erythema. No pallor.  Psychiatric: Affect normal.  Nursing note and vitals reviewed.   LABORATORY DATA:. Appointment on 11/06/2016  Component Date Value Ref Range Status  . WBC 11/06/2016 8.9  4.0 - 10.3 10e3/uL Final  . NEUT# 11/06/2016 6.3  1.5 - 6.5 10e3/uL Final  . HGB 11/06/2016 8.3* 13.0 - 17.1 g/dL Final  . HCT 11/06/2016 28.3* 38.4 - 49.9 % Final  . Platelets 11/06/2016 464* 140 - 400 10e3/uL Final  . MCV 11/06/2016 77.3* 79.3 - 98.0 fL Final  . MCH 11/06/2016 22.7* 27.2 - 33.4 pg Final  . MCHC 11/06/2016 29.3* 32.0 - 36.0 g/dL Final  . RBC 11/06/2016 3.66* 4.20 - 5.82 10e6/uL Final  . RDW 11/06/2016 19.7* 11.0 - 14.6 % Final  . lymph# 11/06/2016 1.6  0.9 - 3.3 10e3/uL Final  . MONO# 11/06/2016 0.8  0.1 - 0.9 10e3/uL Final  . Eosinophils Absolute 11/06/2016 0.1  0.0 - 0.5 10e3/uL Final  . Basophils Absolute 11/06/2016 0.1  0.0 - 0.1 10e3/uL Final  . NEUT% 11/06/2016 70.5  39.0 - 75.0 % Final  . LYMPH% 11/06/2016 18.4  14.0 - 49.0 % Final  . MONO% 11/06/2016 9.1  0.0 - 14.0 % Final  . EOS% 11/06/2016 1.4  0.0 - 7.0 % Final  . BASO% 11/06/2016 0.6  0.0 - 2.0 % Final  . Sodium 11/06/2016 137  136 - 145 mEq/L Final  . Potassium 11/06/2016 3.8  3.5 - 5.1 mEq/L Final  . Chloride 11/06/2016 103  98 - 109 mEq/L Final  . CO2 11/06/2016 25  22 - 29 mEq/L Final  . Glucose 11/06/2016 141* 70 - 140 mg/dl Final  . BUN 11/06/2016 12.0  7.0 - 26.0 mg/dL Final  . Creatinine  11/06/2016 0.9  0.7 - 1.3 mg/dL Final  . Total Bilirubin 11/06/2016 0.37  0.20 - 1.20 mg/dL Final  . Alkaline Phosphatase 11/06/2016 106  40 - 150 U/L Final  . AST 11/06/2016 14  5 - 34 U/L Final  . ALT 11/06/2016 10  0 - 55 U/L Final  . Total Protein 11/06/2016 7.0  6.4 - 8.3 g/dL Final  . Albumin 11/06/2016 3.2* 3.5 - 5.0 g/dL Final  . Calcium 11/06/2016 9.3  8.4 - 10.4 mg/dL Final  . Anion Gap 11/06/2016 9  3 - 11 mEq/L Final  .  EGFR 11/06/2016 >90  >90 ml/min/1.73 m2 Final    RADIOGRAPHIC STUDIES: No results found.  ASSESSMENT/PLAN:    Rectal adenocarcinoma  Patient presented to the [facility] today to receive his first day of radiation treatments.  He was also scheduled to start his Xeloda oral therapy this morning; but held the first dose this morning secondary to having a fever last night.  Patient states that he had a mild URI infection approximate 3 weeks ago with some low-grade fevers.  He states that all of those symptoms completely cleared.  However, he has had a low-grade temperature to maximum 100.9 last night on 3 different occasions recently.  He denies any URI symptoms.  He feels he has a little bit of a hoarse voice today.  He denies any cough or UTI symptoms.  He denies any chills.  On exam today.  Lungs were clear bilaterally.  Posterior oropharynx is clear with no erythema or exudate.  Patient has no nasal congestion noted; but does have a mild hoarse voice.  Patient doesn't appear toxic.  Patient is afebrile today with temperature 98.0 and all other vital signs stable.  Dr. Burr Medico in to see patient as well; and has advised patient that he may be experiencing a low-grade, intermittent fever secondary to his untreated cancer.  Patient was advised to continue with his radiation treatments as scheduled.  He was also encouraged to start taking the Xeloda oral therapy tonight as planned.  Patient was advised to call/return or go directly to the emergency department for any  worsening symptoms whatsoever.  Note: Since patient was seen at the [facility] today; will plan to cancel all labs on Monday, 11/13/2016.  Will change to labs and a follow-up visit here at the [facility] on Thursday, 11/16/2016 instead.  Also-patient will receive no further Feraheme for the time being.    Iron deficiency anemia due to chronic blood loss Patient has history of iron deficiency anemia.  Per Dr. Burr Medico- pt was positive for hemachromatosis.  Patient's hemoglobin was 7.3 last week and he received an initial infusion of Feraheme.  On Monday, 11/06/2016 patient's hemoglobin had improved from 7.3 up to 8.3.  Due to patient's new diagnosis of hemochromatosis-will cancel the second Feraheme infusion that was initially scheduled for 11/10/2016 due to risk for iron toxicity.  Also, patient was advised to hold taking any iron tablets at this time as well.   Patient stated understanding of all instructions; and was in agreement with this plan of care. The patient knows to call the clinic with any problems, questions or concerns.   Total time spent with patient was 25 minutes;  with greater than 75 percent of that time spent in face to face counseling regarding patient's symptoms,  and coordination of care and follow up.  Disclaimer:This dictation was prepared with Dragon/digital dictation along with [employer]. Any transcriptional errors that result from this process are unintentional.  Drue Second, NP 11/08/2016    Addendum I have seen the patient, examined him. I agree with the assessment and and plan and have edited the notes.   He has has a few episodes of fever, resolved spontaneously, he appears well, no suspicion for sepsis. Will start Xeloda today with concurrent RT today.  Cancel his second feraheem infusion due to his new hemochromatosis. We'll repeat these iron level next week.  I'll see him back next Thursday.  Truitt Merle MD

## 2016-11-08 NOTE — Assessment & Plan Note (Signed)
Patient has history of iron deficiency anemia.  Per Dr. Burr Medico- pt was positive for hemachromatosis.  Patient's hemoglobin was 7.3 last week and he received an initial infusion of Feraheme.  On Monday, 11/06/2016 patient's hemoglobin had improved from 7.3 up to 8.3.  Due to patient's new diagnosis of hemochromatosis-will cancel the second Feraheme infusion that was initially scheduled for 11/10/2016 due to risk for iron toxicity.  Also, patient was advised to hold taking any iron tablets at this time as well.

## 2016-11-08 NOTE — Telephone Encounter (Signed)
lvm to inform pt of added lab/ov appt on 3/22 at 115 pm per LOS

## 2016-11-09 ENCOUNTER — Ambulatory Visit
Admission: RE | Admit: 2016-11-09 | Discharge: 2016-11-09 | Disposition: A | Discharge status: Home / Self Care | Payer: BLUE CROSS/BLUE SHIELD | Source: Ambulatory Visit | Attending: Radiation Oncology | Admitting: Radiation Oncology

## 2016-11-09 ENCOUNTER — Ambulatory Visit: Payer: BLUE CROSS/BLUE SHIELD

## 2016-11-09 DIAGNOSIS — Z510 Encounter for antineoplastic radiation therapy: Secondary | ICD-10-CM | POA: Diagnosis not present

## 2016-11-09 NOTE — Progress Notes (Signed)
Patient education, "radiation therapy and you book, my busines card, , fatigue, n,v,d, skin irritation, bladder changes,spasms,urgency,dysuri, frequency, may need to eat 5-6 smaller meals, stay hydrated, use babay wipes non alcohol instead of toilet paper, gentile wipes,no rubbing, scrubbing, or scratching, may need a sitz bath, luke warm water only , hair loss possible,area being treated,  Imodium as needed, low fiber diet for diarrhea, no fresh fgruits or fresh veges, increase protein in diet, stay hydrated, sees MD weekly and prn Sitz bath given  11:34 AM

## 2016-11-10 ENCOUNTER — Ambulatory Visit: Payer: BLUE CROSS/BLUE SHIELD

## 2016-11-10 ENCOUNTER — Ambulatory Visit: Payer: BLUE CROSS/BLUE SHIELD | Admitting: Hematology

## 2016-11-10 ENCOUNTER — Ambulatory Visit
Admission: RE | Admit: 2016-11-10 | Discharge: 2016-11-10 | Disposition: A | Discharge status: Home / Self Care | Payer: BLUE CROSS/BLUE SHIELD | Source: Ambulatory Visit | Attending: Radiation Oncology | Admitting: Radiation Oncology

## 2016-11-10 DIAGNOSIS — Z510 Encounter for antineoplastic radiation therapy: Secondary | ICD-10-CM | POA: Diagnosis not present

## 2016-11-10 NOTE — Progress Notes (Addendum)
  Radiation Oncology         [phone] ________________________________  Name: David Clarke MRN: [id number]  Date: 11/01/2016  DOB: 10-29-1955  SIMULATION AND TREATMENT PLANNING NOTE  DIAGNOSIS:     ICD-9-CM ICD-10-CM   1. Rectal adenocarcinoma  154.1 C20      Site:  pelvis  NARRATIVE:  The patient was brought to the [facility].  Identity was confirmed.  All relevant records and images related to the planned course of therapy were reviewed.   Written consent to proceed with treatment was confirmed which was freely given after reviewing the details related to the planned course of therapy had been reviewed with the patient.  Then, the patient was set-up in a stable reproducible  supine position for radiation therapy.  CT images were obtained.  Surface markings were placed.    Medically necessary complex treatment device(s) for immobilization:  Customized vac lock bag.   The CT images were loaded into the planning software.  Then the target and avoidance structures were contoured.  Treatment planning then occurred.  The radiation prescription was entered and confirmed.   I have requested : Intensity Modulated Radiotherapy (IMRT) is medically necessary for this case for the following reason:  Sparing of critical normal structures including the femoral heads bilaterally and bowel.  This is medically necessary due to the patient's very low rectal tumor. The patient will be treated in a manner similar to anal cancer, including treatment to the inguinal lymph node regions bilaterally  PLAN:  The patient will receive 45 Gy in 25 fractions initially. It is anticipated that the patient will then receive a 9 gray boost to yield a I'll dose of 54 gray..  ________________________________   Jodelle Gross, MD, PhD

## 2016-11-13 ENCOUNTER — Ambulatory Visit: Payer: BLUE CROSS/BLUE SHIELD | Admitting: Hematology

## 2016-11-13 ENCOUNTER — Other Ambulatory Visit: Payer: BLUE CROSS/BLUE SHIELD

## 2016-11-13 ENCOUNTER — Ambulatory Visit
Admission: RE | Admit: 2016-11-13 | Discharge: 2016-11-13 | Disposition: A | Discharge status: Home / Self Care | Payer: BLUE CROSS/BLUE SHIELD | Source: Ambulatory Visit | Attending: Radiation Oncology | Admitting: Radiation Oncology

## 2016-11-13 ENCOUNTER — Telehealth: Payer: Self-pay | Admitting: Nurse Practitioner

## 2016-11-13 ENCOUNTER — Ambulatory Visit: Payer: BLUE CROSS/BLUE SHIELD

## 2016-11-13 DIAGNOSIS — Z510 Encounter for antineoplastic radiation therapy: Secondary | ICD-10-CM | POA: Diagnosis not present

## 2016-11-13 NOTE — Telephone Encounter (Signed)
Called to check in with patient.  He stated that he was more tired over the weekend; but feels that his energy is slowly returning to baseline.  He states that he occasionally feels some dizziness as well; and this provider.  Advised patient to push fluids is much as possible.  Patient was advised to call/return of electrical to the emergency department for any worsening symptoms whatsoever.

## 2016-11-14 ENCOUNTER — Ambulatory Visit: Payer: BLUE CROSS/BLUE SHIELD

## 2016-11-14 ENCOUNTER — Ambulatory Visit
Admission: RE | Admit: 2016-11-14 | Discharge: 2016-11-14 | Disposition: A | Discharge status: Home / Self Care | Payer: BLUE CROSS/BLUE SHIELD | Source: Ambulatory Visit | Attending: Radiation Oncology | Admitting: Radiation Oncology

## 2016-11-14 ENCOUNTER — Other Ambulatory Visit: Payer: Self-pay | Admitting: *Deleted

## 2016-11-14 DIAGNOSIS — Z510 Encounter for antineoplastic radiation therapy: Secondary | ICD-10-CM | POA: Diagnosis not present

## 2016-11-14 NOTE — Telephone Encounter (Signed)
Pt called & left message that he was looking on MyChart & wants to clarify appts for thurs 11/16/16 for radiation @ 10 am & lab @ 1:15 pm & MD @ 1:45 PM.  He also wants to know if he is to get an iron infusion.  Discussed with Dr Burr Medico & message left on pt's identified vm that appts are correct & no iron infusion for now.

## 2016-11-15 ENCOUNTER — Ambulatory Visit
Admission: RE | Admit: 2016-11-15 | Discharge: 2016-11-15 | Disposition: A | Discharge status: Home / Self Care | Payer: BLUE CROSS/BLUE SHIELD | Source: Ambulatory Visit | Attending: Radiation Oncology | Admitting: Radiation Oncology

## 2016-11-15 ENCOUNTER — Ambulatory Visit: Payer: BLUE CROSS/BLUE SHIELD

## 2016-11-15 DIAGNOSIS — Z510 Encounter for antineoplastic radiation therapy: Secondary | ICD-10-CM | POA: Diagnosis not present

## 2016-11-16 ENCOUNTER — Ambulatory Visit: Payer: BLUE CROSS/BLUE SHIELD

## 2016-11-16 ENCOUNTER — Other Ambulatory Visit (HOSPITAL_BASED_OUTPATIENT_CLINIC_OR_DEPARTMENT_OTHER): Payer: BLUE CROSS/BLUE SHIELD

## 2016-11-16 ENCOUNTER — Ambulatory Visit: Payer: BLUE CROSS/BLUE SHIELD | Admitting: Hematology

## 2016-11-16 ENCOUNTER — Telehealth: Payer: Self-pay | Admitting: Hematology

## 2016-11-16 ENCOUNTER — Ambulatory Visit
Admission: RE | Admit: 2016-11-16 | Discharge: 2016-11-16 | Disposition: A | Discharge status: Home / Self Care | Payer: BLUE CROSS/BLUE SHIELD | Source: Ambulatory Visit | Attending: Radiation Oncology | Admitting: Radiation Oncology

## 2016-11-16 DIAGNOSIS — C20 Malignant neoplasm of rectum: Secondary | ICD-10-CM | POA: Diagnosis not present

## 2016-11-16 DIAGNOSIS — Z510 Encounter for antineoplastic radiation therapy: Secondary | ICD-10-CM | POA: Diagnosis not present

## 2016-11-16 LAB — CBC WITH DIFFERENTIAL/PLATELET
BASO%: 0.6 % (ref 0.0–2.0)
BASOS ABS: 0 10*3/uL (ref 0.0–0.1)
EOS%: 4.8 % (ref 0.0–7.0)
Eosinophils Absolute: 0.2 10*3/uL (ref 0.0–0.5)
HCT: 30.4 % — ABNORMAL LOW (ref 38.4–49.9)
HGB: 9.1 g/dL — ABNORMAL LOW (ref 13.0–17.1)
LYMPH%: 12.3 % — AB (ref 14.0–49.0)
MCH: 24.3 pg — AB (ref 27.2–33.4)
MCHC: 29.9 g/dL — AB (ref 32.0–36.0)
MCV: 81.3 fL (ref 79.3–98.0)
MONO#: 0.5 10*3/uL (ref 0.1–0.9)
MONO%: 10.1 % (ref 0.0–14.0)
NEUT#: 3.7 10*3/uL (ref 1.5–6.5)
NEUT%: 72.2 % (ref 39.0–75.0)
Platelets: 340 10*3/uL (ref 140–400)
RBC: 3.74 10*6/uL — AB (ref 4.20–5.82)
RDW: 25.1 % — AB (ref 11.0–14.6)
WBC: 5.1 10*3/uL (ref 4.0–10.3)
lymph#: 0.6 10*3/uL — ABNORMAL LOW (ref 0.9–3.3)

## 2016-11-16 LAB — COMPREHENSIVE METABOLIC PANEL
ALT: 11 U/L (ref 0–55)
AST: 12 U/L (ref 5–34)
Albumin: 3.2 g/dL — ABNORMAL LOW (ref 3.5–5.0)
Alkaline Phosphatase: 97 U/L (ref 40–150)
Anion Gap: 9 mEq/L (ref 3–11)
BUN: 13.2 mg/dL (ref 7.0–26.0)
CHLORIDE: 102 meq/L (ref 98–109)
CO2: 25 meq/L (ref 22–29)
Calcium: 9.1 mg/dL (ref 8.4–10.4)
Creatinine: 0.7 mg/dL (ref 0.7–1.3)
GLUCOSE: 79 mg/dL (ref 70–140)
POTASSIUM: 4.1 meq/L (ref 3.5–5.1)
SODIUM: 136 meq/L (ref 136–145)
Total Bilirubin: 0.4 mg/dL (ref 0.20–1.20)
Total Protein: 6.8 g/dL (ref 6.4–8.3)

## 2016-11-16 NOTE — Telephone Encounter (Signed)
lvm to inform pt of r/s MD appt to 3/26 after lab appt per LOS

## 2016-11-17 ENCOUNTER — Ambulatory Visit
Admission: RE | Admit: 2016-11-17 | Discharge: 2016-11-17 | Disposition: A | Discharge status: Home / Self Care | Payer: BLUE CROSS/BLUE SHIELD | Source: Ambulatory Visit | Attending: Radiation Oncology | Admitting: Radiation Oncology

## 2016-11-17 ENCOUNTER — Ambulatory Visit: Payer: BLUE CROSS/BLUE SHIELD

## 2016-11-17 DIAGNOSIS — Z510 Encounter for antineoplastic radiation therapy: Secondary | ICD-10-CM | POA: Diagnosis not present

## 2016-11-20 ENCOUNTER — Ambulatory Visit: Payer: BLUE CROSS/BLUE SHIELD

## 2016-11-20 ENCOUNTER — Other Ambulatory Visit (HOSPITAL_BASED_OUTPATIENT_CLINIC_OR_DEPARTMENT_OTHER): Payer: BLUE CROSS/BLUE SHIELD

## 2016-11-20 ENCOUNTER — Ambulatory Visit (HOSPITAL_BASED_OUTPATIENT_CLINIC_OR_DEPARTMENT_OTHER): Payer: BLUE CROSS/BLUE SHIELD | Admitting: Hematology

## 2016-11-20 ENCOUNTER — Encounter: Payer: Self-pay | Admitting: Radiation Oncology

## 2016-11-20 ENCOUNTER — Ambulatory Visit
Admission: RE | Admit: 2016-11-20 | Discharge: 2016-11-20 | Disposition: A | Discharge status: Home / Self Care | Payer: BLUE CROSS/BLUE SHIELD | Source: Ambulatory Visit | Attending: Radiation Oncology | Admitting: Radiation Oncology

## 2016-11-20 ENCOUNTER — Encounter: Payer: Self-pay | Admitting: Hematology

## 2016-11-20 VITALS — BP 133/73 | HR 79 | Temp 98.0°F | Resp 18 | Ht 72.0 in | Wt 136.1 lb

## 2016-11-20 DIAGNOSIS — R634 Abnormal weight loss: Secondary | ICD-10-CM

## 2016-11-20 DIAGNOSIS — D500 Iron deficiency anemia secondary to blood loss (chronic): Secondary | ICD-10-CM | POA: Diagnosis not present

## 2016-11-20 DIAGNOSIS — C20 Malignant neoplasm of rectum: Secondary | ICD-10-CM

## 2016-11-20 DIAGNOSIS — K922 Gastrointestinal hemorrhage, unspecified: Secondary | ICD-10-CM

## 2016-11-20 DIAGNOSIS — Z148 Genetic carrier of other disease: Secondary | ICD-10-CM | POA: Diagnosis not present

## 2016-11-20 DIAGNOSIS — R5383 Other fatigue: Secondary | ICD-10-CM | POA: Diagnosis not present

## 2016-11-20 DIAGNOSIS — Z510 Encounter for antineoplastic radiation therapy: Secondary | ICD-10-CM | POA: Diagnosis not present

## 2016-11-20 LAB — COMPREHENSIVE METABOLIC PANEL
ALT: 9 U/L (ref 0–55)
AST: 12 U/L (ref 5–34)
Albumin: 3.1 g/dL — ABNORMAL LOW (ref 3.5–5.0)
Alkaline Phosphatase: 86 U/L (ref 40–150)
Anion Gap: 9 mEq/L (ref 3–11)
BILIRUBIN TOTAL: 0.34 mg/dL (ref 0.20–1.20)
BUN: 13.5 mg/dL (ref 7.0–26.0)
CO2: 26 meq/L (ref 22–29)
Calcium: 9.2 mg/dL (ref 8.4–10.4)
Chloride: 101 mEq/L (ref 98–109)
Creatinine: 0.8 mg/dL (ref 0.7–1.3)
GLUCOSE: 99 mg/dL (ref 70–140)
Potassium: 4 mEq/L (ref 3.5–5.1)
Sodium: 135 mEq/L — ABNORMAL LOW (ref 136–145)
TOTAL PROTEIN: 6.5 g/dL (ref 6.4–8.3)

## 2016-11-20 LAB — CBC WITH DIFFERENTIAL/PLATELET
BASO%: 0.2 % (ref 0.0–2.0)
Basophils Absolute: 0 10*3/uL (ref 0.0–0.1)
EOS%: 4.7 % (ref 0.0–7.0)
Eosinophils Absolute: 0.3 10*3/uL (ref 0.0–0.5)
HCT: 29.9 % — ABNORMAL LOW (ref 38.4–49.9)
HGB: 9.5 g/dL — ABNORMAL LOW (ref 13.0–17.1)
LYMPH%: 10.6 % — AB (ref 14.0–49.0)
MCH: 25.1 pg — ABNORMAL LOW (ref 27.2–33.4)
MCHC: 31.8 g/dL — ABNORMAL LOW (ref 32.0–36.0)
MCV: 79 fL — AB (ref 79.3–98.0)
MONO#: 0.5 10*3/uL (ref 0.1–0.9)
MONO%: 8.7 % (ref 0.0–14.0)
NEUT%: 75.8 % — ABNORMAL HIGH (ref 39.0–75.0)
NEUTROS ABS: 4 10*3/uL (ref 1.5–6.5)
Platelets: 475 10*3/uL — ABNORMAL HIGH (ref 140–400)
RBC: 3.79 10*6/uL — AB (ref 4.20–5.82)
RDW: 28 % — ABNORMAL HIGH (ref 11.0–14.6)
WBC: 5.3 10*3/uL (ref 4.0–10.3)
lymph#: 0.6 10*3/uL — ABNORMAL LOW (ref 0.9–3.3)

## 2016-11-20 NOTE — Progress Notes (Signed)
[facility]  [phone] [phone]  Clinic Follow Up Note   Patient Care Team: Robyn Haber, MD as PCP - General (Family Medicine) Michael Boston, MD as Consulting Physician (General Surgery) Jerene Bears, MD as Consulting Physician (Gastroenterology) Truitt Merle, MD as Consulting Physician (Hematology) Kyung Rudd, MD as Consulting Physician (Radiation Oncology) 11/20/2016  CHIEF COMPLAINTS:  Rectal adenocarcinoma   Oncology History   Cancer Staging Rectal adenocarcinoma  Staging form: Colon and Rectum, AJCC 8th Edition - Clinical stage from 10/09/2016: Stage IIIB (cT3, cN1b, cM0) - Signed by Truitt Merle, MD on 10/25/2016       Rectal adenocarcinoma    10/03/2016 - 10/03/2016 Hospital Admission    Admit date: 10/03/16 The patient presented to the [facility] ED with rectal bleeding and difficulty with hygiene.      10/09/2016 Initial Diagnosis    Rectal adenocarcinoma       10/09/2016 Procedure    Colonoscopy showed a fungating, infiltrative and ulcerated partially obstructing large mass in the distal rectum, measuring 9 cm, prolapsing at anal canal. Scope not advanced proximal to the rectosigmoid colon.      10/09/2016 Initial Biopsy    Rectal mass biopsy showed invasive adenocarcinoma.      10/13/2016 Imaging    CT Chest Abdomen Pelvis w contrast IMPRESSION: Large irregular enhancing rectal mass consistent with known rectal cancer. There are small perirectal and small pelvic sidewall lymph nodes which are suspicious. Prominent perirectal vessels are also noted. No evidence of metastatic retroperitoneal or sigmoid mesocolon adenopathy or hepatic or pulmonary metastasis.      10/24/2016 Imaging    MRI Pelvis IMPRESSION: 1. Moderately motion degraded exam. 2. Large mass is centered at the anus with extension into the low rectum. No complicating obstruction. 3. Mild mesorectal adenopathy, suspicious. 4. Right inguinal hernia containing  nonobstructive small bowel.       HISTORY OF PRESENTING ILLNESS (10/25/16):  David Clarke 61 y.o. male is here because of his recently diagnosed rectal adenocarcinoma. He was referred by his gastroenterologist title parietal. Patient presents to my clinic, accompanied by his wife and his daughter.   He has noticed a anal mass accompanied by intermittent bleeding and pain in the rectum for the past 5 months. The patient initially presented to the [facility] ED on 10/03/16 with rectal bleeding and difficulty with hygeine. He was without health insurance previously and so did not pursue evaluation or treatment until this time.  The patient was seen by Dr. Hilarie Fredrickson for a colonoscopy and biopsy on 10/09/16, revealing a large partially obstructing low rectal mass, prolapsing to anal verge. Biopsy showed adenocarcinoma. CT C/A/P on 10/13/16 revealed a large, irregular, enhancing mass consistent with known rectal cancer. There were several suspicious small perirectal and small pelvic sidewall lymph nodes. Prominent perirectal vessels were also noted. MRI of the pelvis on 10/24/16 showed a large mass centered at the anus with extension into the low rectum without obstruction. There was suspicious mild mesorectal adenopathy, and a right inguinal hernia containing nonobstructive small bowel.   The patient comes to the clinic for consultation today accompanied by his partner and daughter. He denies abdominal cramps, nausea, bowel or bladder concerns. He reports taking OTC stool softener as needed. He reports his appetite is good, but he has lost 12-15 lbs in the past few weeks. His daughter notes she has hemochromatosis and questions if this could be playing a role in the patient's anemia.  CURRENT THERAPY: Current radiation and chemotherapy  with Xeloda 1500 mg twice daily on the day of radiation, started on 11/08/2016  INTERIM HISTORY: David Clarke returns for follow up. He is feeling well today. He has felt some tightness in  his throat after taking his pill, even after drinking a whole bottle of water. He is going to see an ENT soon. His daughter says he has had a constant running nose. Pt has noticed some sensitivity in his feet, but not numbness or tingling. This is intermittent. He has some occasional light headedness, but denies vertigo or dizziness. His daughter says he has been tired very often. On occasion, he has some blood in his stool. The blood is bright red. Denies redness of skin, dryness, fever, bowel problems, or any other concerns. He has been working about 20 hours a week. He quit smoking several years ago.   MEDICAL HISTORY:  Past Medical History:  Diagnosis Date  . Allergy   . Cancer ([place]) 10/09/2016   rectal adenocarcinoma    SURGICAL HISTORY: Past Surgical History:  Procedure Laterality Date  . COLONOSCOPY W/ BIOPSIES Bilateral 10/09/2016   rectal adenocarcinoma  . MOUTH SURGERY     age 67, to remove extra "eye" teeth  . [place] teeth extraction      SOCIAL HISTORY: Social History   Social History  . Marital status: Single    Spouse name: N/A  . Number of children: 1  . Years of education: N/A   Occupational History  . restaurant/caterer    Social History Main Topics  . Smoking status: Former Smoker    Years: 2.00    Types: Cigarettes    Quit date: 2002  . Smokeless tobacco: Never Used  . Alcohol use 6.0 oz/week    4 Glasses of wine, 6 Cans of beer per week     Comment: beer or a glass or wine most days of the week  . Drug use: No  . Sexual activity: Yes    Partners: Female   Other Topics Concern  . Not on file   Social History Narrative  . No narrative on file    FAMILY HISTORY: Family History  Problem Relation Age of Onset  . Other Mother     bile duct tumor  . Stroke Mother   . Cancer Mother 75    bile duct cancer   . Prostate cancer Father 48  . Emphysema Father   . Colon cancer Neg Hx   . Esophageal cancer Neg Hx   . Pancreatic cancer Neg Hx   .  Rectal cancer Neg Hx   . Stomach cancer Neg Hx     ALLERGIES:  is allergic to sulfa antibiotics.  MEDICATIONS:  Current Outpatient Prescriptions  Medication Sig Dispense Refill  . acetaminophen (TYLENOL) 325 MG tablet Take 650 mg by mouth every 6 (six) hours as needed.    . capecitabine (XELODA) 500 MG tablet Take 3 tablets (1,500 mg total) by mouth 2 (two) times daily after a meal. 180 tablet 0  . Sennosides (SENOKOT PO) Take 1 tablet by mouth as needed. [facility] brand     Current Facility-Administered Medications  Medication Dose Route Frequency Provider Last Rate Last Dose  . 0.9 %  sodium chloride infusion  500 mL Intravenous Continuous Jerene Bears, MD        REVIEW OF SYSTEMS:   Constitutional: Denies fevers, chills or abnormal night sweats (+) fatigue  Eyes: Denies blurriness of vision, double vision or watery eyes Ears, nose, mouth, throat, and face:  Denies mucositis or sore throat (+) tightness in throat (+) running nose  Respiratory: Denies cough, dyspnea or wheezes Cardiovascular: Denies palpitation, chest discomfort or lower extremity swelling Gastrointestinal:  Denies nausea, heartburn or change in bowel habits Skin: Denies abnormal skin rashes Lymphatics: Denies new lymphadenopathy or easy bruising Neurological:Denies numbness, tingling or new weaknesses (+) sensitivity in feet (+) light headed Behavioral/Psych: Mood is stable, no new changes  All other systems were reviewed with the patient and are negative.   PHYSICAL EXAMINATION: ECOG PERFORMANCE STATUS: 1 - Symptomatic but completely ambulatory  Vitals:   11/20/16 1132  BP: 133/73  Pulse: 79  Resp: 18  Temp: 98 F (36.7 C)   Filed Weights   11/20/16 1132  Weight: 136 lb 1.6 oz (61.7 kg)   GENERAL:alert, no distress and comfortable.  SKIN: skin color, texture, turgor are normal, no rashes or significant lesions EYES: normal, conjunctiva are pink and non-injected, sclera clear OROPHARYNX:no exudate, no  erythema and lips, buccal mucosa, and tongue normal  NECK: supple, thyroid normal size, non-tender, without nodularity LYMPH:  no palpable lymphadenopathy in the cervical, axillary or inguinal LUNGS: clear to auscultation and percussion with normal breathing effort HEART: regular rate & rhythm and no murmurs and no lower extremity edema ABDOMEN:abdomen soft, non-tender and normal bowel sounds Musculoskeletal:no cyanosis of digits and no clubbing  PSYCH: alert & oriented x 3 with fluent speech NEURO: no focal motor/sensory deficits.  RECTAL: there is a large prolapsing mass at his anal verge, no bleeding, Rectal exam was not performed.   LABORATORY DATA:  I have reviewed the data as listed CBC Latest Ref Rng & Units 11/20/2016 11/16/2016 11/06/2016  WBC 4.0 - 10.3 10e3/uL 5.3 5.1 8.9  Hemoglobin 13.0 - 17.1 g/dL 9.5(L) 9.1(L) 8.3(L)  Hematocrit 38.4 - 49.9 % 29.9(L) 30.4(L) 28.3(L)  Platelets 140 - 400 10e3/uL 475(H) 340 464(H)   CMP Latest Ref Rng & Units 11/20/2016 11/16/2016 11/06/2016  Glucose 70 - 140 mg/dl 99 79 141(H)  BUN 7.0 - 26.0 mg/dL 13.5 13.2 12.0  Creatinine 0.7 - 1.3 mg/dL 0.8 0.7 0.9  Sodium 136 - 145 mEq/L 135(L) 136 137  Potassium 3.5 - 5.1 mEq/L 4.0 4.1 3.8  Chloride 96 - 112 mEq/L - - -  CO2 22 - 29 mEq/L 26 25 25   Calcium 8.4 - 10.4 mg/dL 9.2 9.1 9.3  Total Protein 6.4 - 8.3 g/dL 6.5 6.8 7.0  Total Bilirubin 0.20 - 1.20 mg/dL 0.34 0.40 0.37  Alkaline Phos 40 - 150 U/L 86 97 106  AST 5 - 34 U/L 12 12 14   ALT 0 - 55 U/L 9 11 10    CEA ([facility]) 10/27/2016: 7.81  PATHOLOGY:  Diagnosis 10/09/2016 Rectum, biopsy, mass - ADENOCARCINOMA. Microscopic Comment The features are consistent with colorectal-type adenocarcinoma.  RADIOGRAPHIC STUDIES: I have personally reviewed the radiological images as listed and agreed with the findings in the report.  MRI Pelvis 10/24/2016 IMPRESSION: 1. Moderately motion degraded exam. 2. Large mass is centered at the anus with  extension into the low rectum. No complicating obstruction. 3. Mild mesorectal adenopathy, suspicious. 4. Right inguinal hernia containing nonobstructive small bowel.  CT CAP w Contrast 10/13/2016 IMPRESSION: Large irregular enhancing rectal mass consistent with known rectal cancer. There are small perirectal and small pelvic sidewall lymph nodes which are suspicious. Prominent perirectal vessels are also noted.  No evidence of metastatic retroperitoneal or sigmoid mesocolon adenopathy or hepatic or pulmonary metastasis.  COLONOSOCPY 10/09/2016 Dr. Hilarie Fredrickson  A fungating, infiltrative and  ulcerated partially obstructing large mass was found in the distal rectum. The mass was circumferential. The mass measured seven cm in length (prolapsing and from dentate line to approx 7 cm). In addition, its inner diameter measured nine mm. Oozing was present. This was biopsied with a cold forceps for histology. - Due to the distal rectal mass the bowel preparation was poor and unable to be cleared via the adult upper endoscope (adult upper endoscope used due to obstructing mass). Scope not advanced proximal to the rectosigmoid colon.  ASSESSMENT & PLAN: 61 y.o.  gentleman, previously healthy, presented with a anal mass and a mild intermittent rectal bleeding for 5-6 months. Fatigue, anorexia, fatigue and weight loss for a month.   1) Rectal Adenocarcinoma, low rectum, [id number], stage IIIB, MSI-pending  -I previously reviewed his CT and MRI scan, colonoscopy and biopsy findings in detail with pt and his family members -Based on his imaging findings, he has clinical stage III disease. We reviewed his image in our GI tumor Board this morning, he also has a small liver lesion which is indeterminate, likely benign, and we will follow up with a repeated CT in about 3 months.  -We previously discussed the nature history of rectal cancer, high risk of cancer recurrence for locally advanced disease, and the  standard therapy for stage III disease, which is neoadjuvant chemotherapy and irradiation, followed by definitive surgery (likely APR per Dr. Johney Maine), and adjuvant chemo -We previously discussed the chemo options with concurrent RT, including Xeloda 870m/m2, which he would take twice a day on days he receives radiation, or continuous 5-FU infusion.  -The patient has started concurrent chemoradiation with Xeloda, tolerating well so far, we'll continue -Lab reviewed, his anemia has improved. -We again reviewed the potential side effects from chemoradiation, due to his location of the rectal cancer, he is likely going to have some skin toxicity towards the end of the radiation.   2) Weight loss and dietary concerns. -I referred the patient to speak [facility] [employer]. He did not get a call. I will refer him again -I encouraged the patient to increase his calorie and protein intake. He will stay well hydrated. -I encouraged the patient to maintain regular exercise as he is able to improve his energy levels. 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 -Hemoglobin was 8.3 a few weeks ago with low MCV, likely anemia of iron deficiency from chronic blood loss. This was confirmed by his iron study. -His daughter was diagnosed with hemochromatosis (homozygous), his genetic testing showed he is a carrier -He received IV iron for him once, will repeat study next week, to see if he needs additional dose. I'll be cautious about IV iron replacement due to his hemachromatosis carrier status.  4. Fatigue -Likely the combination of anemia, chemotherapy and radiation. -Overall stable. I encouraged him to remain to be physically active.  Plan -Weekly labs -Continue Xeloda and  radiation. -refer to nutrition again, [facility] -The patient will return for follow up in 2 weeks.    All questions were answered. The patient knows to call the clinic with any problems, questions or concerns.  I spent 20 minutes counseling the patient face to face. The total time spent in the appointment was 25 minutes and more than  50% was on counseling.  This document serves as a record of services personally performed by Truitt Merle, MD. It was created on her behalf by Martinique Casey, a trained medical scribe. The creation of this record is based on the scribe's personal observations and the provider's statements to them. This document has been checked and approved by the attending provider.  I have reviewed the above documentation for accuracy and completeness and I agree with the above.   Truitt Merle, MD 11/20/2016

## 2016-11-21 ENCOUNTER — Ambulatory Visit
Admission: RE | Admit: 2016-11-21 | Discharge: 2016-11-21 | Disposition: A | Discharge status: Home / Self Care | Payer: BLUE CROSS/BLUE SHIELD | Source: Ambulatory Visit | Attending: Radiation Oncology | Admitting: Radiation Oncology

## 2016-11-21 ENCOUNTER — Ambulatory Visit: Payer: BLUE CROSS/BLUE SHIELD

## 2016-11-21 DIAGNOSIS — Z510 Encounter for antineoplastic radiation therapy: Secondary | ICD-10-CM | POA: Diagnosis not present

## 2016-11-22 ENCOUNTER — Ambulatory Visit: Payer: BLUE CROSS/BLUE SHIELD

## 2016-11-22 ENCOUNTER — Ambulatory Visit
Admission: RE | Admit: 2016-11-22 | Discharge: 2016-11-22 | Disposition: A | Discharge status: Home / Self Care | Payer: BLUE CROSS/BLUE SHIELD | Source: Ambulatory Visit | Attending: Radiation Oncology | Admitting: Radiation Oncology

## 2016-11-22 DIAGNOSIS — Z510 Encounter for antineoplastic radiation therapy: Secondary | ICD-10-CM | POA: Diagnosis not present

## 2016-11-23 ENCOUNTER — Ambulatory Visit: Payer: BLUE CROSS/BLUE SHIELD

## 2016-11-23 ENCOUNTER — Ambulatory Visit
Admission: RE | Admit: 2016-11-23 | Discharge: 2016-11-23 | Disposition: A | Discharge status: Home / Self Care | Payer: BLUE CROSS/BLUE SHIELD | Source: Ambulatory Visit | Attending: Radiation Oncology | Admitting: Radiation Oncology

## 2016-11-23 DIAGNOSIS — Z510 Encounter for antineoplastic radiation therapy: Secondary | ICD-10-CM | POA: Diagnosis not present

## 2016-11-24 ENCOUNTER — Ambulatory Visit
Admission: RE | Admit: 2016-11-24 | Discharge: 2016-11-24 | Disposition: A | Discharge status: Home / Self Care | Payer: BLUE CROSS/BLUE SHIELD | Source: Ambulatory Visit | Attending: Radiation Oncology | Admitting: Radiation Oncology

## 2016-11-24 ENCOUNTER — Ambulatory Visit: Payer: BLUE CROSS/BLUE SHIELD

## 2016-11-24 DIAGNOSIS — Z510 Encounter for antineoplastic radiation therapy: Secondary | ICD-10-CM | POA: Diagnosis not present

## 2016-11-24 NOTE — Progress Notes (Signed)
  Radiation Oncology         [phone]) [phone] ________________________________  Name: David Clarke MRN: [id number]  Date: 11/20/2016  DOB: 1956-05-11  COMPLEX SIMULATION  NOTE  Diagnosis: rectal cancer  Narrative The patient has initially been planned to receive a course of radiation treatment to a dose of 45 gray in 25 fractions at 1.8 gray per fraction. The patient will now receive a boost to the high risk target volume for an additional 9 gray. This will be delivered in 5 fractions at 1.8 gray per fraction and a cone down boost technique will be utilized. To accomplish this, an additional IMRT planhas been designed for this purpose. A complex isodose plan is requested to ensure that the high-risk target region receives the appropriate radiation dose and that the nearby normal structures continue to be appropriately spared. The patient's final total dose therefore will be 54 gray.   ________________________________ ------------------------------------------------  Jodelle Gross, MD, PhD

## 2016-11-26 ENCOUNTER — Telehealth: Payer: Self-pay | Admitting: Hematology

## 2016-11-26 NOTE — Telephone Encounter (Signed)
Appointments complete per 3/26 los. Per los patient will check my chart for appointments.

## 2016-11-27 ENCOUNTER — Other Ambulatory Visit (HOSPITAL_BASED_OUTPATIENT_CLINIC_OR_DEPARTMENT_OTHER): Payer: BLUE CROSS/BLUE SHIELD

## 2016-11-27 ENCOUNTER — Ambulatory Visit
Admission: RE | Admit: 2016-11-27 | Discharge: 2016-11-27 | Disposition: A | Discharge status: Home / Self Care | Payer: BLUE CROSS/BLUE SHIELD | Source: Ambulatory Visit | Attending: Radiation Oncology | Admitting: Radiation Oncology

## 2016-11-27 ENCOUNTER — Ambulatory Visit: Payer: BLUE CROSS/BLUE SHIELD

## 2016-11-27 DIAGNOSIS — C20 Malignant neoplasm of rectum: Secondary | ICD-10-CM

## 2016-11-27 DIAGNOSIS — D500 Iron deficiency anemia secondary to blood loss (chronic): Secondary | ICD-10-CM

## 2016-11-27 DIAGNOSIS — Z510 Encounter for antineoplastic radiation therapy: Secondary | ICD-10-CM | POA: Diagnosis not present

## 2016-11-27 LAB — COMPREHENSIVE METABOLIC PANEL
ALT: 10 U/L (ref 0–55)
AST: 11 U/L (ref 5–34)
Albumin: 2.7 g/dL — ABNORMAL LOW (ref 3.5–5.0)
Alkaline Phosphatase: 69 U/L (ref 40–150)
Anion Gap: 8 mEq/L (ref 3–11)
BUN: 11.4 mg/dL (ref 7.0–26.0)
CHLORIDE: 100 meq/L (ref 98–109)
CO2: 26 meq/L (ref 22–29)
Calcium: 8.4 mg/dL (ref 8.4–10.4)
Creatinine: 0.7 mg/dL (ref 0.7–1.3)
EGFR: >90 mL/min/{1.73_m2} (ref >90)
GLUCOSE: 102 mg/dL (ref 70–140)
Potassium: 3.7 mEq/L (ref 3.5–5.1)
Sodium: 134 mEq/L — ABNORMAL LOW (ref 136–145)
Total Bilirubin: 0.31 mg/dL (ref 0.20–1.20)
Total Protein: 5.4 g/dL — ABNORMAL LOW (ref 6.4–8.3)

## 2016-11-27 LAB — CBC WITH DIFFERENTIAL/PLATELET
BASO%: 0.7 % (ref 0.0–2.0)
Basophils Absolute: 0 10*3/uL (ref 0.0–0.1)
EOS%: 11.9 % — AB (ref 0.0–7.0)
Eosinophils Absolute: 0.5 10*3/uL (ref 0.0–0.5)
HCT: 29.9 % — ABNORMAL LOW (ref 38.4–49.9)
HGB: 9.7 g/dL — ABNORMAL LOW (ref 13.0–17.1)
LYMPH#: 0.3 10*3/uL — AB (ref 0.9–3.3)
LYMPH%: 6.6 % — AB (ref 14.0–49.0)
MCH: 26.1 pg — ABNORMAL LOW (ref 27.2–33.4)
MCHC: 32.5 g/dL (ref 32.0–36.0)
MCV: 80.4 fL (ref 79.3–98.0)
MONO#: 0.5 10*3/uL (ref 0.1–0.9)
MONO%: 11.5 % (ref 0.0–14.0)
NEUT#: 3 10*3/uL (ref 1.5–6.5)
NEUT%: 69.3 % (ref 39.0–75.0)
Platelets: 317 10*3/uL (ref 140–400)
RBC: 3.72 10*6/uL — ABNORMAL LOW (ref 4.20–5.82)
RDW: 29.3 % — ABNORMAL HIGH (ref 11.0–14.6)
WBC: 4.3 10*3/uL (ref 4.0–10.3)

## 2016-11-27 LAB — IRON AND TIBC
%SAT: 15 % — AB (ref 20–55)
Iron: 40 ug/dL — ABNORMAL LOW (ref 42–163)
TIBC: 270 ug/dL (ref 202–409)
UIBC: 230 ug/dL (ref 117–376)

## 2016-11-27 LAB — CEA (IN HOUSE-CHCC): CEA (CHCC-IN HOUSE): 5.56 ng/mL — AB (ref 0.00–5.00)

## 2016-11-27 LAB — FERRITIN: Ferritin: 99 ng/ml (ref 22–316)

## 2016-11-28 ENCOUNTER — Ambulatory Visit: Payer: BLUE CROSS/BLUE SHIELD

## 2016-11-28 ENCOUNTER — Ambulatory Visit: Payer: BLUE CROSS/BLUE SHIELD | Admitting: Nutrition

## 2016-11-28 ENCOUNTER — Ambulatory Visit
Admission: RE | Admit: 2016-11-28 | Discharge: 2016-11-28 | Disposition: A | Discharge status: Home / Self Care | Payer: BLUE CROSS/BLUE SHIELD | Source: Ambulatory Visit | Attending: Radiation Oncology | Admitting: Radiation Oncology

## 2016-11-28 ENCOUNTER — Telehealth: Payer: Self-pay | Admitting: Pharmacist

## 2016-11-28 DIAGNOSIS — Z510 Encounter for antineoplastic radiation therapy: Secondary | ICD-10-CM | POA: Diagnosis not present

## 2016-11-28 NOTE — Progress Notes (Signed)
61 year old male diagnosed with rectal cancer.  He is a patient of Dr. Burr Medico.  Past medical history includes allergies.  Medications include Senokot and Xeloda.  Labs include sodium 135 and albumin 3.1 on March 26.  Height: 6 feet 0 inches. Weight: 136.1 pounds. Usual body weight: 150 pounds. BMI: 18.46.  Patient endorses 15 pound weight loss from usual body weight. Reports he has a good appetite and is used to grazing on food throughout the day. States his skin is beginning to get irritated and he is developing diarrhea. Reports he is familiar with a low fiber diet.  Nutrition diagnosis:  Unintended weight loss related to new diagnosis of rectal cancer and associated treatments as evidenced by 9% weight loss from usual body weight.  Intervention: Educated patient to begin small frequent, high-calorie, high-protein meals and snacks. Reviewed appropriate oral nutrition supplements and provided samples of whey protein powder and protein bars. Patient would like to choose healthier oral nutrition supplements, so recommended [employer] and provided purchasing information. Provided patient with fact sheets.  Questions were answered.  Teach back method used.  Contact information was given.  Monitoring, evaluation, goals: Patient will tolerate increased calories and protein to minimize further weight loss and promote healing.  Next visit: Tuesday, April 17, after radiation therapy.  **Disclaimer: This note was dictated with voice recognition software. Similar sounding words can inadvertently be transcribed and this note may contain transcription errors which may not have been corrected upon publication of note.**

## 2016-11-28 NOTE — Telephone Encounter (Signed)
Oral Chemotherapy Pharmacist Encounter  Received call in [facility] with questions about some side effects he was experiencing likely related to treatment with Xeloda/XRT for rectal cancer. Xeloda start date: 11/08/16  Patient experiencing some diarrhea in the past 48 hours and he had not experienced any yet with treatment.  2 episodes 11/26/16  4 episodes 11/27/16  2 episodes today- 11/28/16 Patient stated that he had not used any loperamide before today and had not taken any after 1st episode this morning, but did take 4mg  after second episode which occurred about 30 minutes prior to patient calling clinic. Patient denies fever, chills, exposure to GI illness. Patient does endorse some decrease in appetite since starting treatment, but is able to eat and drink well daily. Instructions for loperamide usage reviewed with patient.  Patient will call clinic with any significant changes and will keep a log of diarrhea episodes and # of loperamide tablets taken. I will check in with patient on Friday 12/01/16 if I have not heard from him before then. Next appt with Dr. Burr Medico 12/05/16.  Oral Oncology Clinic will continue to follow.  Johny Drilling, PharmD, BCPS, BCOP 11/28/2016  4:06 PM Oral Oncology Clinic [phone]

## 2016-11-29 ENCOUNTER — Ambulatory Visit
Admission: RE | Admit: 2016-11-29 | Discharge: 2016-11-29 | Disposition: A | Discharge status: Home / Self Care | Payer: BLUE CROSS/BLUE SHIELD | Source: Ambulatory Visit | Attending: Radiation Oncology | Admitting: Radiation Oncology

## 2016-11-29 ENCOUNTER — Other Ambulatory Visit: Payer: Self-pay | Admitting: Hematology

## 2016-11-29 ENCOUNTER — Ambulatory Visit: Payer: BLUE CROSS/BLUE SHIELD

## 2016-11-29 DIAGNOSIS — C20 Malignant neoplasm of rectum: Secondary | ICD-10-CM

## 2016-11-29 DIAGNOSIS — Z510 Encounter for antineoplastic radiation therapy: Secondary | ICD-10-CM | POA: Diagnosis not present

## 2016-11-30 ENCOUNTER — Ambulatory Visit
Admission: RE | Admit: 2016-11-30 | Discharge: 2016-11-30 | Disposition: A | Discharge status: Home / Self Care | Payer: BLUE CROSS/BLUE SHIELD | Source: Ambulatory Visit | Attending: Radiation Oncology | Admitting: Radiation Oncology

## 2016-11-30 ENCOUNTER — Ambulatory Visit: Payer: BLUE CROSS/BLUE SHIELD

## 2016-11-30 ENCOUNTER — Encounter: Payer: Self-pay | Admitting: Radiation Oncology

## 2016-11-30 ENCOUNTER — Other Ambulatory Visit: Payer: Self-pay | Admitting: *Deleted

## 2016-11-30 VITALS — BP 110/70 | HR 84 | Temp 97.7°F | Resp 18 | Wt 131.2 lb

## 2016-11-30 DIAGNOSIS — Z510 Encounter for antineoplastic radiation therapy: Secondary | ICD-10-CM | POA: Diagnosis not present

## 2016-11-30 DIAGNOSIS — C20 Malignant neoplasm of rectum: Secondary | ICD-10-CM

## 2016-11-30 MED ORDER — CAPECITABINE 500 MG PO TABS: 825.0000 mg/m2 | ORAL_TABLET | Freq: Two times a day (BID) | ORAL | 0 refills | Status: DC

## 2016-11-30 NOTE — Progress Notes (Signed)
  Radiation Oncology         [phone] ________________________________  Name: David Clarke MRN: [id number]  Date: 11/30/2016  DOB: 12-29-1955  Weekly Radiation Therapy Management    ICD-9-CM ICD-10-CM   1. Rectal adenocarcinoma  154.1 C20      Current Dose: 30.6 Gy     Planned Dose:  54 Gy  Narrative . . . . . . . . The patient presents for routine under treatment assessment. Pt denies nausea but endorses diarrhea 4-5 times a day that began four days ago. He is now treating this with imodium, which has offered relief. Per dietician's recommendation, pt has begun a low fiber diet with smaller meals spread throughout the day. He reports an improved appetite. Pt also reports his buttock is becoming irritated, along with left groin. He has been using sitz baths, baby wipes, and A&D Ointment for this. Pt endorses xeloda BID.                           The patient is otherwise without complaint.                                 Set-up films were reviewed.                                 The chart was checked. Physical Findings. . .  weight is 131 lb 3.2 oz (59.5 kg). His oral temperature is 97.7 F (36.5 C). His blood pressure is 110/70 and his pulse is 84. His respiration is 18. . Weight essentially stable.  No significant changes. Lungs are clear to auscultation bilaterally. Heart has regular rate and rhythm. Abdomen soft, non-tender, normal bowel sounds.  Impression . . . . . . . The patient is tolerating radiation. Plan . . . . . . . . . . . . Continue treatment as planned.  ________________________________   Blair Promise, PhD, MD  This document serves as a record of services personally performed by Gery Pray, MD. It was created on his behalf by Linward Natal, a trained medical scribe. The creation of this record is based on the scribe's personal observations and the provider's statements to them. This document has been checked and approved by the attending provider.

## 2016-11-30 NOTE — Progress Notes (Addendum)
Weekly rad txs rectal cancer , 17/30 completed,  Saw Barbara Neff,dietician 11/28/16, is on low fiber diet, eating smaller meals throughout the day, bottom is getting irritated, 2 small spots raw, left groin has quarter sized open peeled area,  raw, uses sitz baths, baby wipes  And A & D ointment, , blow dryer, has leage at times, uses blow dryer  taking Xeloda bid  Taking diarrhea started Sunday, now taking imodium has helped today formed stool,  Appetite getting better 10:38 AM BP 110/70 (BP Location: Left Arm, Patient Position: Sitting, Cuff Size: Normal)   Pulse 84   Temp 97.7 F (36.5 C) (Oral)   Resp 18   Wt 131 lb 3.2 oz (59.5 kg)   BMI 17.79 kg/m   Wt Readings from Last 3 Encounters:  11/30/16 131 lb 3.2 oz (59.5 kg)  11/20/16 136 lb 1.6 oz (61.7 kg)  11/08/16 137 lb 1.6 oz (62.2 kg)

## 2016-12-01 ENCOUNTER — Ambulatory Visit: Payer: BLUE CROSS/BLUE SHIELD

## 2016-12-01 ENCOUNTER — Encounter: Payer: Self-pay | Admitting: Radiation Oncology

## 2016-12-01 ENCOUNTER — Ambulatory Visit
Admission: RE | Admit: 2016-12-01 | Discharge: 2016-12-01 | Disposition: A | Discharge status: Home / Self Care | Payer: BLUE CROSS/BLUE SHIELD | Source: Ambulatory Visit | Attending: Radiation Oncology | Admitting: Radiation Oncology

## 2016-12-01 DIAGNOSIS — Z510 Encounter for antineoplastic radiation therapy: Secondary | ICD-10-CM | POA: Diagnosis not present

## 2016-12-02 ENCOUNTER — Telehealth: Payer: Self-pay | Admitting: Hematology

## 2016-12-02 NOTE — Telephone Encounter (Signed)
Due YF off day moved 4/10 f/u to 4/13 after xrt w/app. Left message for patient.

## 2016-12-03 ENCOUNTER — Emergency Department (HOSPITAL_COMMUNITY): Payer: BLUE CROSS/BLUE SHIELD

## 2016-12-03 ENCOUNTER — Inpatient Hospital Stay (HOSPITAL_COMMUNITY)
Admission: EM | Admit: 2016-12-03 | Discharge: 2016-12-09 | DRG: 372 | Disposition: A | Discharge status: Home / Self Care | Payer: BLUE CROSS/BLUE SHIELD | Attending: Internal Medicine | Admitting: Internal Medicine

## 2016-12-03 ENCOUNTER — Encounter (HOSPITAL_COMMUNITY): Payer: Self-pay

## 2016-12-03 DIAGNOSIS — R509 Fever, unspecified: Secondary | ICD-10-CM | POA: Diagnosis present

## 2016-12-03 DIAGNOSIS — A046 Enteritis due to Yersinia enterocolitica: Secondary | ICD-10-CM | POA: Diagnosis present

## 2016-12-03 DIAGNOSIS — E876 Hypokalemia: Secondary | ICD-10-CM

## 2016-12-03 DIAGNOSIS — Z681 Body mass index (BMI) 19 or less, adult: Secondary | ICD-10-CM | POA: Diagnosis not present

## 2016-12-03 DIAGNOSIS — A09 Infectious gastroenteritis and colitis, unspecified: Secondary | ICD-10-CM | POA: Diagnosis not present

## 2016-12-03 DIAGNOSIS — C218 Malignant neoplasm of overlapping sites of rectum, anus and anal canal: Secondary | ICD-10-CM | POA: Diagnosis present

## 2016-12-03 DIAGNOSIS — E871 Hypo-osmolality and hyponatremia: Secondary | ICD-10-CM | POA: Diagnosis present

## 2016-12-03 DIAGNOSIS — E222 Syndrome of inappropriate secretion of antidiuretic hormone: Secondary | ICD-10-CM | POA: Diagnosis present

## 2016-12-03 DIAGNOSIS — D708 Other neutropenia: Secondary | ICD-10-CM | POA: Diagnosis not present

## 2016-12-03 DIAGNOSIS — T451X5A Adverse effect of antineoplastic and immunosuppressive drugs, initial encounter: Secondary | ICD-10-CM | POA: Diagnosis present

## 2016-12-03 DIAGNOSIS — L598 Other specified disorders of the skin and subcutaneous tissue related to radiation: Secondary | ICD-10-CM | POA: Diagnosis present

## 2016-12-03 DIAGNOSIS — Y842 Radiological procedure and radiotherapy as the cause of abnormal reaction of the patient, or of later complication, without mention of misadventure at the time of the procedure: Secondary | ICD-10-CM | POA: Diagnosis present

## 2016-12-03 DIAGNOSIS — D500 Iron deficiency anemia secondary to blood loss (chronic): Secondary | ICD-10-CM | POA: Diagnosis present

## 2016-12-03 DIAGNOSIS — E860 Dehydration: Secondary | ICD-10-CM | POA: Diagnosis present

## 2016-12-03 DIAGNOSIS — E440 Moderate protein-calorie malnutrition: Secondary | ICD-10-CM | POA: Diagnosis present

## 2016-12-03 DIAGNOSIS — E46 Unspecified protein-calorie malnutrition: Secondary | ICD-10-CM | POA: Diagnosis not present

## 2016-12-03 DIAGNOSIS — K627 Radiation proctitis: Secondary | ICD-10-CM | POA: Diagnosis present

## 2016-12-03 DIAGNOSIS — I4581 Long QT syndrome: Secondary | ICD-10-CM | POA: Diagnosis present

## 2016-12-03 DIAGNOSIS — R197 Diarrhea, unspecified: Secondary | ICD-10-CM

## 2016-12-03 DIAGNOSIS — C20 Malignant neoplasm of rectum: Secondary | ICD-10-CM | POA: Diagnosis not present

## 2016-12-03 DIAGNOSIS — D509 Iron deficiency anemia, unspecified: Secondary | ICD-10-CM | POA: Diagnosis not present

## 2016-12-03 DIAGNOSIS — R9431 Abnormal electrocardiogram [ECG] [EKG]: Secondary | ICD-10-CM | POA: Diagnosis not present

## 2016-12-03 DIAGNOSIS — D72819 Decreased white blood cell count, unspecified: Secondary | ICD-10-CM | POA: Diagnosis present

## 2016-12-03 DIAGNOSIS — D6481 Anemia due to antineoplastic chemotherapy: Secondary | ICD-10-CM | POA: Diagnosis not present

## 2016-12-03 DIAGNOSIS — B370 Candidal stomatitis: Secondary | ICD-10-CM | POA: Diagnosis present

## 2016-12-03 DIAGNOSIS — Z87891 Personal history of nicotine dependence: Secondary | ICD-10-CM

## 2016-12-03 LAB — URINALYSIS, ROUTINE W REFLEX MICROSCOPIC
BILIRUBIN URINE: NEGATIVE
GLUCOSE, UA: NEGATIVE mg/dL
HGB URINE DIPSTICK: NEGATIVE
KETONES UR: 5 mg/dL — AB
LEUKOCYTES UA: NEGATIVE
Nitrite: NEGATIVE
PH: 5 (ref 5.0–8.0)
Protein, ur: 30 mg/dL — AB
SPECIFIC GRAVITY, URINE: 1.02 (ref 1.005–1.030)
Squamous Epithelial / LPF: NONE SEEN

## 2016-12-03 LAB — COMPREHENSIVE METABOLIC PANEL
ALK PHOS: 61 U/L (ref 38–126)
ALT: 19 U/L (ref 17–63)
AST: 17 U/L (ref 15–41)
Albumin: 2.6 g/dL — ABNORMAL LOW (ref 3.5–5.0)
Anion gap: 7 (ref 5–15)
BUN: 9 mg/dL (ref 6–20)
CALCIUM: 7.7 mg/dL — AB (ref 8.9–10.3)
CHLORIDE: 92 mmol/L — AB (ref 101–111)
CO2: 27 mmol/L (ref 22–32)
CREATININE: 0.64 mg/dL (ref 0.61–1.24)
Glucose, Bld: 141 mg/dL — ABNORMAL HIGH (ref 65–99)
Potassium: 2.5 mmol/L — CL (ref 3.5–5.1)
SODIUM: 126 mmol/L — AB (ref 135–145)
Total Bilirubin: 0.8 mg/dL (ref 0.3–1.2)
Total Protein: 5.4 g/dL — ABNORMAL LOW (ref 6.5–8.1)

## 2016-12-03 LAB — CBC WITH DIFFERENTIAL/PLATELET
Basophils Absolute: 0 10*3/uL (ref 0.0–0.1)
Basophils Relative: 0 %
EOS PCT: 1 %
Eosinophils Absolute: 0 10*3/uL (ref 0.0–0.7)
HEMATOCRIT: 31.4 % — AB (ref 39.0–52.0)
HEMOGLOBIN: 10.4 g/dL — AB (ref 13.0–17.0)
LYMPHS ABS: 0.1 10*3/uL — AB (ref 0.7–4.0)
Lymphocytes Relative: 3 %
MCH: 26.7 pg (ref 26.0–34.0)
MCHC: 33.1 g/dL (ref 30.0–36.0)
MCV: 80.5 fL (ref 78.0–100.0)
MONO ABS: 0.4 10*3/uL (ref 0.1–1.0)
MONOS PCT: 13 %
NEUTROS ABS: 2.5 10*3/uL (ref 1.7–7.7)
Neutrophils Relative %: 83 %
Platelets: 272 10*3/uL (ref 150–400)
RBC: 3.9 MIL/uL — AB (ref 4.22–5.81)
RDW: 27.8 % — AB (ref 11.5–15.5)
WBC Morphology: INCREASED
WBC: 3 10*3/uL — AB (ref 4.0–10.5)

## 2016-12-03 LAB — INFLUENZA PANEL BY PCR (TYPE A & B)
INFLBPCR: NEGATIVE
Influenza A By PCR: NEGATIVE

## 2016-12-03 LAB — C DIFFICILE QUICK SCREEN W PCR REFLEX
C DIFFICILE (CDIFF) TOXIN: NEGATIVE
C Diff antigen: NEGATIVE
C Diff interpretation: NOT DETECTED

## 2016-12-03 LAB — PROTIME-INR
INR: 1.22
Prothrombin Time: 15.5 seconds — ABNORMAL HIGH (ref 11.4–15.2)

## 2016-12-03 LAB — I-STAT CG4 LACTIC ACID, ED: Lactic Acid, Venous: 1.63 mmol/L (ref 0.5–1.9)

## 2016-12-03 LAB — MAGNESIUM: MAGNESIUM: 1.4 mg/dL — AB (ref 1.7–2.4)

## 2016-12-03 MED ORDER — NYSTATIN 100000 UNIT/ML MT SUSP
5.0000 mL | Freq: Four times a day (QID) | OROMUCOSAL | Status: DC
  Administered 2016-12-04 (×2): 500000 [IU] via ORAL
  Filled 2016-12-03 (×7): qty 5

## 2016-12-03 MED ORDER — SODIUM CHLORIDE 0.9 % IV BOLUS (SEPSIS)
1000.0000 mL | Freq: Once | INTRAVENOUS | Status: AC
  Administered 2016-12-03: 1000 mL via INTRAVENOUS

## 2016-12-03 MED ORDER — POTASSIUM CHLORIDE 10 MEQ/100ML IV SOLN
10.0000 meq | INTRAVENOUS | Status: AC
  Administered 2016-12-03 (×3): 10 meq via INTRAVENOUS
  Filled 2016-12-03 (×3): qty 100

## 2016-12-03 MED ORDER — MAGNESIUM SULFATE 4 GM/100ML IV SOLN
4.0000 g | Freq: Once | INTRAVENOUS | Status: AC
  Administered 2016-12-03: 4 g via INTRAVENOUS
  Filled 2016-12-03: qty 100

## 2016-12-03 MED ORDER — DIPHENOXYLATE-ATROPINE 2.5-0.025 MG PO TABS
2.0000 | ORAL_TABLET | Freq: Once | ORAL | Status: AC
  Administered 2016-12-03: 2 via ORAL
  Filled 2016-12-03: qty 2

## 2016-12-03 MED ORDER — KCL IN DEXTROSE-NACL 40-5-0.9 MEQ/L-%-% IV SOLN
INTRAVENOUS | Status: DC
  Administered 2016-12-03 – 2016-12-04 (×2): via INTRAVENOUS
  Filled 2016-12-03 (×4): qty 1000

## 2016-12-03 MED ORDER — ACETAMINOPHEN 650 MG RE SUPP: 650.0000 mg | Freq: Four times a day (QID) | RECTAL | Status: DC | PRN

## 2016-12-03 MED ORDER — SODIUM CHLORIDE 0.9% FLUSH
3.0000 mL | Freq: Two times a day (BID) | INTRAVENOUS | Status: DC
  Administered 2016-12-06 – 2016-12-08 (×3): 3 mL via INTRAVENOUS

## 2016-12-03 MED ORDER — ENOXAPARIN SODIUM 40 MG/0.4ML ~~LOC~~ SOLN
40.0000 mg | SUBCUTANEOUS | Status: DC
  Filled 2016-12-03 (×3): qty 0.4

## 2016-12-03 MED ORDER — ADULT MULTIVITAMIN W/MINERALS CH
1.0000 | ORAL_TABLET | Freq: Every day | ORAL | Status: DC
Start: 2016-12-03 — End: 2016-12-09
  Administered 2016-12-04 – 2016-12-09 (×3): 1 via ORAL
  Filled 2016-12-03 (×6): qty 1

## 2016-12-03 MED ORDER — SODIUM CHLORIDE 0.9 % IV SOLN: 30.0000 meq | Freq: Once | INTRAVENOUS | Status: DC

## 2016-12-03 MED ORDER — DIPHENOXYLATE-ATROPINE 2.5-0.025 MG PO TABS
1.0000 | ORAL_TABLET | Freq: Four times a day (QID) | ORAL | Status: DC | PRN
  Administered 2016-12-04 – 2016-12-05 (×3): 1 via ORAL
  Filled 2016-12-03 (×3): qty 1

## 2016-12-03 MED ORDER — MAGNESIUM OXIDE 400 (241.3 MG) MG PO TABS
400.0000 mg | ORAL_TABLET | Freq: Two times a day (BID) | ORAL | Status: AC
  Administered 2016-12-03 – 2016-12-04 (×2): 400 mg via ORAL
  Filled 2016-12-03 (×2): qty 1

## 2016-12-03 MED ORDER — ENSURE ENLIVE PO LIQD
237.0000 mL | Freq: Two times a day (BID) | ORAL | Status: DC
  Administered 2016-12-04 – 2016-12-06 (×5): 237 mL via ORAL

## 2016-12-03 MED ORDER — POTASSIUM CHLORIDE CRYS ER 20 MEQ PO TBCR
40.0000 meq | EXTENDED_RELEASE_TABLET | Freq: Once | ORAL | Status: DC
  Filled 2016-12-03: qty 2

## 2016-12-03 MED ORDER — ACETAMINOPHEN 325 MG PO TABS
650.0000 mg | ORAL_TABLET | Freq: Four times a day (QID) | ORAL | Status: DC | PRN
  Administered 2016-12-03 – 2016-12-09 (×7): 650 mg via ORAL
  Filled 2016-12-03 (×7): qty 2

## 2016-12-03 NOTE — ED Notes (Signed)
Pt cannot use restroom at this time, aware urine specimen is needed.  

## 2016-12-03 NOTE — ED Provider Notes (Signed)
[facility] DEPT Provider Note   CSN: [id number] Arrival date & time: 12/03/16  1043     History   Chief Complaint Chief Complaint  Patient presents with  . Fever    chemo    HPI David Clarke is a 61 y.o. male.  HPI   Fever 101 this AM 930 and also Friday night.  Fever Friday night, then improved, until late last night and this AM.  Diarrhea started last Sunday, kept diarrhea diary per recommendation of oncology team.  Had fever once a few weeks ago prior to chemo.  Have appt tomorrow. No other symptoms including no abdominal pain, cough, sore throat , congestion.  Reports radiation burns.  18 days ago started chemotherapy, xeloda and radiation.  Past Medical History:  Diagnosis Date  . Allergy   . Cancer ([place]) 10/09/2016   rectal adenocarcinoma    Patient Active Problem List   Diagnosis Date Noted  . Hemochromatosis 11/19/2016  . Family history of hemochromatosis 10/25/2016  . Iron deficiency anemia due to chronic blood loss 10/25/2016  . Rectal adenocarcinoma  10/05/2016  . Rectal bleeding 10/05/2016    Past Surgical History:  Procedure Laterality Date  . COLONOSCOPY W/ BIOPSIES Bilateral 10/09/2016   rectal adenocarcinoma  . MOUTH SURGERY     age 72, to remove extra "eye" teeth  . wisdoom teeth extraction         Home Medications    Prior to Admission medications   Medication Sig Start Date End Date Taking? Authorizing Provider  Ascorbic Acid (VITAMIN C) 1000 MG tablet Take 1,000 mg by mouth daily.   Yes Historical Provider, MD  capecitabine (XELODA) 500 MG tablet Take 3 tablets (1,500 mg total) by mouth 2 (two) times daily after a meal. Refill 60 tabs only. 11/30/16  Yes Truitt Merle, MD  loperamide (IMODIUM A-D) 2 MG tablet Take 2 mg by mouth 4 (four) times daily as needed for diarrhea or loose stools.   Yes Historical Provider, MD  Multiple Vitamins-Minerals (MULTIVITAMIN ADULT PO) Take 1 tablet by mouth daily.   Yes Historical Provider, MD    Family  History Family History  Problem Relation Age of Onset  . Other Mother     bile duct tumor  . Stroke Mother   . Cancer Mother 58    bile duct cancer   . Prostate cancer Father 58  . Emphysema Father   . Colon cancer Neg Hx   . Esophageal cancer Neg Hx   . Pancreatic cancer Neg Hx   . Rectal cancer Neg Hx   . Stomach cancer Neg Hx     Social History Social History  Substance Use Topics  . Smoking status: Former Smoker    Years: 2.00    Types: Cigarettes    Quit date: 2002  . Smokeless tobacco: Never Used  . Alcohol use 6.0 oz/week    4 Glasses of wine, 6 Cans of beer per week     Comment: beer or a glass or wine most days of the week     Allergies   Sulfa antibiotics   Review of Systems Review of Systems  Constitutional: Positive for appetite change, fever and unexpected weight change.  HENT: Negative for congestion, mouth sores and sore throat.   Eyes: Negative for visual disturbance.  Respiratory: Negative for cough and shortness of breath.   Cardiovascular: Negative for chest pain.  Gastrointestinal: Positive for diarrhea (6-7 events per day, has been present over last few weeks). Negative for  abdominal pain, nausea and vomiting.  Genitourinary: Negative for difficulty urinating and dysuria.  Musculoskeletal: Negative for back pain and neck stiffness.  Skin: Positive for rash.  Neurological: Negative for syncope and headaches.     Physical Exam Updated Vital Signs BP 105/67   Pulse 97   Temp 99.2 F (37.3 C) (Oral)   Resp 18   SpO2 100%   Physical Exam  Constitutional: He is oriented to person, place, and time. He appears well-developed and well-nourished. No distress.  HENT:  Head: Normocephalic and atraumatic.  Eyes: Conjunctivae and EOM are normal.  Neck: Normal range of motion.  Cardiovascular: Normal rate, regular rhythm, normal heart sounds and intact distal pulses.  Exam reveals no gallop and no friction rub.   No murmur  heard. Pulmonary/Chest: Effort normal and breath sounds normal. No respiratory distress. He has no wheezes. He has no rales.  Abdominal: Soft. He exhibits no distension. There is no tenderness. There is no guarding.  Genitourinary:  Genitourinary Comments: Pedunculated fungating lesionsx2 protruding from rectum, erythema of buttock extending to posterior scrotum, ulceration left to gluteal cleft. No sign of cellulitis or abscess.  Musculoskeletal: He exhibits no edema.  Neurological: He is alert and oriented to person, place, and time.  Skin: Skin is warm and dry. He is not diaphoretic.  Nursing note and vitals reviewed.    ED Treatments / Results  Labs (all labs ordered are listed, but only abnormal results are displayed) Labs Reviewed  COMPREHENSIVE METABOLIC PANEL - Abnormal; Notable for the following:       Result Value   Sodium 126 (*)    Potassium 2.5 (*)    Chloride 92 (*)    Glucose, Bld 141 (*)    Calcium 7.7 (*)    Total Protein 5.4 (*)    Albumin 2.6 (*)    All other components within normal limits  CBC WITH DIFFERENTIAL/PLATELET - Abnormal; Notable for the following:    WBC 3.0 (*)    RBC 3.90 (*)    Hemoglobin 10.4 (*)    HCT 31.4 (*)    RDW 27.8 (*)    Lymphs Abs 0.1 (*)    All other components within normal limits  PROTIME-INR - Abnormal; Notable for the following:    Prothrombin Time 15.5 (*)    All other components within normal limits  URINALYSIS, ROUTINE W REFLEX MICROSCOPIC - Abnormal; Notable for the following:    APPearance HAZY (*)    Ketones, ur 5 (*)    Protein, ur 30 (*)    Bacteria, UA RARE (*)    All other components within normal limits  CULTURE, BLOOD (ROUTINE X 2)  CULTURE, BLOOD (ROUTINE X 2)  C DIFFICILE QUICK SCREEN W PCR REFLEX  GASTROINTESTINAL PANEL BY PCR, STOOL (REPLACES STOOL CULTURE)  INFLUENZA PANEL BY PCR (TYPE A & B)  MAGNESIUM  I-STAT CG4 LACTIC ACID, ED  I-STAT CG4 LACTIC ACID, ED    EKG  EKG Interpretation None        Radiology Dg Chest 2 View  Result Date: 12/03/2016 CLINICAL DATA:  Intermittent fever, current chemotherapy and radiation therapy for rectal tumor. EXAM: CHEST  2 VIEW COMPARISON:  None. FINDINGS: Heart size and mediastinal contours are normal. Lungs are hyperexpanded. Lungs are clear. Nodular density projected over the left upper lung, measuring 10 mm, is compatible with a benign bone island demonstrated on CT of 10/13/2016. No pleural effusion or pneumothorax seen. No acute or suspicious osseous finding. IMPRESSION: 1. No  active cardiopulmonary disease. No evidence of pneumonia or pulmonary edema. No evidence of intrathoracic metastasis. 2. Hyperexpanded lungs suggesting COPD. Electronically Signed   By: Franki Cabot M.D.   On: 12/03/2016 12:05    Procedures Procedures (including critical care time)  Medications Ordered in ED Medications  potassium chloride 10 mEq in 100 mL IVPB (10 mEq Intravenous New Bag/Given 12/03/16 1323)  sodium chloride 0.9 % bolus 1,000 mL (1,000 mLs Intravenous New Bag/Given 12/03/16 1306)     Initial Impression / Assessment and Plan / ED Course  I have reviewed the triage vital signs and the nursing notes.  Pertinent labs & imaging results that were available during my care of the patient were reviewed by me and considered in my medical decision making (see chart for details).    61 year old male with a history of rectal adenocarcinoma on Xeloda and receiving radiation, who presents with concern for fever at home. Patient denies any other symptoms other than diarrhea, likely from the Xeloda, as well as burns related to radiation.  Patient was febrile at home, he is afebrile here in the emergency department. He is not neutropenic, with absolute neutrophil count of 2500.  Do not detect source of bacterial infection at this time, including no sign of pneumonia, no sign of urinary tract infection, no sign of skin infection. We'll send influenza, C. difficile, GI  pathogen panel.    Patient with significant electrolyte abnormalities, including hyponatremia of 126, hypokalemia 2.5    Discussed with Dr. Alvy Bimler of oncology, will stop xeloda, admit for rehydration and correction of electrolyte abnormalities. We'll not start empiric antibiotics unless patient spikes a fever of 101 while in hospital.  Cultures sent and pending.    Final Clinical Impressions(s) / ED Diagnoses   Final diagnoses:  Hypokalemia  Dehydration  Fever, unspecified fever cause  Hyponatremia  Diarrhea, unspecified type    New Prescriptions New Prescriptions   No medications on file     Gareth Morgan, MD 12/03/16 1340

## 2016-12-03 NOTE — H&P (Addendum)
History and Physical    Nasif Bos RKY:[id number] DOB: May 29, 1956 DOA: 12/03/2016    PCP: Robyn Haber, MD  Patient coming from: home  Chief Complaint: diarrhea  HPI: David Clarke is a 61 y.o. male with medical history of anorectal cancer on Xeloda who presents with diarrhea 6-7 episodes a day and a fever of 101.5 today. He has been having this for 3-4 days. No nausea or vomiting. No abdominal pain. No blood on stool. Has been feeling lightheaded with fever. Quite weak. Poor appetite and feels dehydrated. Making small amounts of dark urine  ED Course:  Sodium 126, K 2.5, Mg 1.4 WBC 3.0  stool for c diff negative ER doc has spoken with Dr Alvy Bimler who recommends stopping the Xeloda  Review of Systems:  Weight loss of about 4 lb. All other systems reviewed and apart from HPI, are negative.  Past Medical History:  Diagnosis Date  . Allergy   . Cancer ([place]) 10/09/2016   rectal adenocarcinoma    Past Surgical History:  Procedure Laterality Date  . COLONOSCOPY W/ BIOPSIES Bilateral 10/09/2016   rectal adenocarcinoma  . MOUTH SURGERY     age 55, to remove extra "eye" teeth  . wisdoom teeth extraction      Social History:   reports that he quit smoking about 16 years ago. His smoking use included Cigarettes. He quit after 2.00 years of use. He has never used smokeless tobacco. He reports that he drinks about 6.0 oz of alcohol per week . He reports that he does not use drugs.   Works in [employer]  Allergies  Allergen Reactions  . Sulfa Antibiotics Nausea Only    Reaction may years ago, pt thinks nausea only.     Family History  Problem Relation Age of Onset  . Other Mother     bile duct tumor  . Stroke Mother   . Cancer Mother 55    bile duct cancer   . Prostate cancer Father 40  . Emphysema Father   . Colon cancer Neg Hx   . Esophageal cancer Neg Hx   . Pancreatic cancer Neg Hx   . Rectal cancer Neg Hx   . Stomach cancer Neg Hx      Prior to Admission  medications   Medication Sig Start Date End Date Taking? Authorizing Provider  Ascorbic Acid (VITAMIN C) 1000 MG tablet Take 1,000 mg by mouth daily.   Yes Historical Provider, MD  capecitabine (XELODA) 500 MG tablet Take 3 tablets (1,500 mg total) by mouth 2 (two) times daily after a meal. Refill 60 tabs only. 11/30/16  Yes Truitt Merle, MD  loperamide (IMODIUM A-D) 2 MG tablet Take 2 mg by mouth 4 (four) times daily as needed for diarrhea or loose stools.   Yes Historical Provider, MD  Multiple Vitamins-Minerals (MULTIVITAMIN ADULT PO) Take 1 tablet by mouth daily.   Yes Historical Provider, MD    Physical Exam: Vitals:   12/03/16 1238 12/03/16 1347 12/03/16 1430 12/03/16 1500  BP: 105/67 120/73 115/66 112/68  Pulse: 97 92 87 92  Resp:  13 10 15   Temp:      TempSrc:      SpO2: 100% 98% 98% 99%      Constitutional: NAD, calm, comfortable Eyes: PERTLA, lids and conjunctivae normal ENMT: Mucous membranes are moist. Thrush  Normal dentition.  Neck: normal, supple, no masses, no thyromegaly Respiratory: clear to auscultation bilaterally, no wheezing, no crackles. Normal respiratory effort. No accessory muscle use.  Cardiovascular: S1 & S2 heard, regular rate and rhythm, no murmurs / rubs / gallops. No extremity edema. 2+ pedal pulses. No carotid bruits.  Abdomen: No distension, no tenderness, no masses palpated. No hepatosplenomegaly. Bowel sounds normal.  Musculoskeletal: no clubbing / cyanosis. No joint deformity upper and lower extremities. Good ROM, no contractures. Normal muscle tone.  Skin: no rashes, lesions, ulcers. No induration Neurologic: CN 2-12 grossly intact. Sensation intact, DTR normal. Strength 5/5 in all 4 limbs.  Psychiatric: Normal judgment and insight. Alert and oriented x 3. Normal mood.     Labs on Admission: I have personally reviewed following labs and imaging studies  CBC:  Recent Labs Lab 11/27/16 1113 12/03/16 1135  WBC 4.3 3.0*  NEUTROABS 3.0 2.5  HGB  9.7* 10.4*  HCT 29.9* 31.4*  MCV 80.4 80.5  PLT 317 782   Basic Metabolic Panel:  Recent Labs Lab 11/27/16 1113 12/03/16 1135  NA 134* 126*  K 3.7 2.5*  CL  --  92*  CO2 26 27  GLUCOSE 102 141*  BUN 11.4 9  CREATININE 0.7 0.64  CALCIUM 8.4 7.7*  MG  --  1.4*   GFR: Estimated Creatinine Clearance: 81.6 mL/min (by C-G formula based on SCr of 0.64 mg/dL). Liver Function Tests:  Recent Labs Lab 11/27/16 1113 12/03/16 1135  AST 11 17  ALT 10 19  ALKPHOS 69 61  BILITOT 0.31 0.8  PROT 5.4* 5.4*  ALBUMIN 2.7* 2.6*   No results for input(s): LIPASE, AMYLASE in the last 168 hours. No results for input(s): AMMONIA in the last 168 hours. Coagulation Profile:  Recent Labs Lab 12/03/16 1135  INR 1.22   Cardiac Enzymes: No results for input(s): CKTOTAL, CKMB, CKMBINDEX, TROPONINI in the last 168 hours. BNP (last 3 results) No results for input(s): PROBNP in the last 8760 hours. HbA1C: No results for input(s): HGBA1C in the last 72 hours. CBG: No results for input(s): GLUCAP in the last 168 hours. Lipid Profile: No results for input(s): CHOL, HDL, LDLCALC, TRIG, CHOLHDL, LDLDIRECT in the last 72 hours. Thyroid Function Tests: No results for input(s): TSH, T4TOTAL, FREET4, T3FREE, THYROIDAB in the last 72 hours. Anemia Panel: No results for input(s): VITAMINB12, FOLATE, FERRITIN, TIBC, IRON, RETICCTPCT in the last 72 hours. Urine analysis:    Component Value Date/Time   COLORURINE YELLOW 12/03/2016 1102   APPEARANCEUR HAZY (A) 12/03/2016 1102   LABSPEC 1.020 12/03/2016 1102   PHURINE 5.0 12/03/2016 1102   GLUCOSEU NEGATIVE 12/03/2016 1102   HGBUR NEGATIVE 12/03/2016 1102   BILIRUBINUR NEGATIVE 12/03/2016 1102   KETONESUR 5 (A) 12/03/2016 1102   PROTEINUR 30 (A) 12/03/2016 1102   NITRITE NEGATIVE 12/03/2016 1102   LEUKOCYTESUR NEGATIVE 12/03/2016 1102   Sepsis Labs: @LABRCNTIP (procalcitonin:4,lacticidven:4) ) Recent Results (from the past 240 hour(s))  C  difficile quick scan w PCR reflex     Status: None   Collection Time: 12/03/16  1:02 PM  Result Value Ref Range Status   C Diff antigen NEGATIVE NEGATIVE Final   C Diff toxin NEGATIVE NEGATIVE Final   C Diff interpretation No C. difficile detected.  Final     Radiological Exams on Admission: Dg Chest 2 View  Result Date: 12/03/2016 CLINICAL DATA:  Intermittent fever, current chemotherapy and radiation therapy for rectal tumor. EXAM: CHEST  2 VIEW COMPARISON:  None. FINDINGS: Heart size and mediastinal contours are normal. Lungs are hyperexpanded. Lungs are clear. Nodular density projected over the left upper lung, measuring 10 mm, is compatible with a  benign bone island demonstrated on CT of 10/13/2016. No pleural effusion or pneumothorax seen. No acute or suspicious osseous finding. IMPRESSION: 1. No active cardiopulmonary disease. No evidence of pneumonia or pulmonary edema. No evidence of intrathoracic metastasis. 2. Hyperexpanded lungs suggesting COPD. Electronically Signed   By: Franki Cabot M.D.   On: 12/03/2016 12:05    EKG: Independently reviewed. SR 87 bpm. 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

## 2016-12-04 ENCOUNTER — Ambulatory Visit
Admission: RE | Admit: 2016-12-04 | Discharge: 2016-12-04 | Disposition: A | Discharge status: Home / Self Care | Payer: BLUE CROSS/BLUE SHIELD | Source: Ambulatory Visit | Attending: Radiation Oncology | Admitting: Radiation Oncology

## 2016-12-04 ENCOUNTER — Other Ambulatory Visit: Payer: BLUE CROSS/BLUE SHIELD

## 2016-12-04 ENCOUNTER — Ambulatory Visit: Payer: BLUE CROSS/BLUE SHIELD

## 2016-12-04 ENCOUNTER — Encounter (HOSPITAL_COMMUNITY): Payer: Self-pay

## 2016-12-04 DIAGNOSIS — E876 Hypokalemia: Secondary | ICD-10-CM

## 2016-12-04 DIAGNOSIS — C20 Malignant neoplasm of rectum: Secondary | ICD-10-CM

## 2016-12-04 DIAGNOSIS — A046 Enteritis due to Yersinia enterocolitica: Principal | ICD-10-CM

## 2016-12-04 DIAGNOSIS — D72819 Decreased white blood cell count, unspecified: Secondary | ICD-10-CM

## 2016-12-04 DIAGNOSIS — D6481 Anemia due to antineoplastic chemotherapy: Secondary | ICD-10-CM

## 2016-12-04 DIAGNOSIS — R197 Diarrhea, unspecified: Secondary | ICD-10-CM

## 2016-12-04 DIAGNOSIS — D509 Iron deficiency anemia, unspecified: Secondary | ICD-10-CM

## 2016-12-04 DIAGNOSIS — E871 Hypo-osmolality and hyponatremia: Secondary | ICD-10-CM

## 2016-12-04 DIAGNOSIS — E46 Unspecified protein-calorie malnutrition: Secondary | ICD-10-CM

## 2016-12-04 DIAGNOSIS — R509 Fever, unspecified: Secondary | ICD-10-CM

## 2016-12-04 LAB — BASIC METABOLIC PANEL
ANION GAP: 6 (ref 5–15)
BUN: 8 mg/dL (ref 6–20)
CALCIUM: 6.8 mg/dL — AB (ref 8.9–10.3)
CO2: 24 mmol/L (ref 22–32)
Chloride: 97 mmol/L — ABNORMAL LOW (ref 101–111)
Creatinine, Ser: 0.64 mg/dL (ref 0.61–1.24)
GLUCOSE: 127 mg/dL — AB (ref 65–99)
Potassium: 3 mmol/L — ABNORMAL LOW (ref 3.5–5.1)
Sodium: 127 mmol/L — ABNORMAL LOW (ref 135–145)

## 2016-12-04 LAB — CBC
HEMATOCRIT: 27.1 % — AB (ref 39.0–52.0)
HEMOGLOBIN: 9 g/dL — AB (ref 13.0–17.0)
MCH: 27 pg (ref 26.0–34.0)
MCHC: 33.2 g/dL (ref 30.0–36.0)
MCV: 81.4 fL (ref 78.0–100.0)
Platelets: 260 10*3/uL (ref 150–400)
RBC: 3.33 MIL/uL — AB (ref 4.22–5.81)
RDW: 28.5 % — ABNORMAL HIGH (ref 11.5–15.5)
WBC: 2.2 10*3/uL — ABNORMAL LOW (ref 4.0–10.5)

## 2016-12-04 LAB — DIFFERENTIAL
Basophils Absolute: 0 10*3/uL (ref 0.0–0.1)
Basophils Relative: 1 %
EOS PCT: 1 %
Eosinophils Absolute: 0 10*3/uL (ref 0.0–0.7)
Lymphocytes Relative: 10 %
Lymphs Abs: 0.2 10*3/uL — ABNORMAL LOW (ref 0.7–4.0)
MONOS PCT: 10 %
Monocytes Absolute: 0.2 10*3/uL (ref 0.1–1.0)
Neutro Abs: 1.8 10*3/uL (ref 1.7–7.7)
Neutrophils Relative %: 78 %

## 2016-12-04 LAB — GASTROINTESTINAL PANEL BY PCR, STOOL (REPLACES STOOL CULTURE)
ADENOVIRUS F40/41: NOT DETECTED
ASTROVIRUS: NOT DETECTED
CYCLOSPORA CAYETANENSIS: NOT DETECTED
Campylobacter species: NOT DETECTED
Cryptosporidium: NOT DETECTED
ENTEROPATHOGENIC E COLI (EPEC): NOT DETECTED
ENTEROTOXIGENIC E COLI (ETEC): NOT DETECTED
Entamoeba histolytica: NOT DETECTED
Enteroaggregative E coli (EAEC): NOT DETECTED
Giardia lamblia: NOT DETECTED
Norovirus GI/GII: NOT DETECTED
PLESIMONAS SHIGELLOIDES: NOT DETECTED
ROTAVIRUS A: NOT DETECTED
SAPOVIRUS (I, II, IV, AND V): NOT DETECTED
SHIGA LIKE TOXIN PRODUCING E COLI (STEC): NOT DETECTED
Salmonella species: NOT DETECTED
Shigella/Enteroinvasive E coli (EIEC): NOT DETECTED
VIBRIO SPECIES: NOT DETECTED
Vibrio cholerae: NOT DETECTED
Yersinia enterocolitica: DETECTED — AB

## 2016-12-04 LAB — MAGNESIUM: MAGNESIUM: 2.2 mg/dL (ref 1.7–2.4)

## 2016-12-04 LAB — SODIUM, URINE, RANDOM: Sodium, Ur: 92 mmol/L

## 2016-12-04 LAB — OSMOLALITY, URINE: OSMOLALITY UR: 451 mosm/kg (ref 300–900)

## 2016-12-04 MED ORDER — PREMIER PROTEIN SHAKE
11.0000 [oz_av] | Freq: Two times a day (BID) | ORAL | Status: DC
  Administered 2016-12-04 – 2016-12-09 (×8): 11 [oz_av] via ORAL
  Filled 2016-12-04 (×11): qty 325.31

## 2016-12-04 MED ORDER — CIPROFLOXACIN HCL 500 MG PO TABS
500.0000 mg | ORAL_TABLET | Freq: Two times a day (BID) | ORAL | Status: DC
  Administered 2016-12-04 – 2016-12-09 (×10): 500 mg via ORAL
  Filled 2016-12-04 (×10): qty 1

## 2016-12-04 MED ORDER — POTASSIUM CHLORIDE CRYS ER 20 MEQ PO TBCR
40.0000 meq | EXTENDED_RELEASE_TABLET | Freq: Once | ORAL | Status: AC
  Administered 2016-12-04: 40 meq via ORAL
  Filled 2016-12-04: qty 2

## 2016-12-04 MED ORDER — POTASSIUM CHLORIDE IN NACL 40-0.9 MEQ/L-% IV SOLN
INTRAVENOUS | Status: DC
  Administered 2016-12-04 – 2016-12-05 (×3): 100 mL/h via INTRAVENOUS
  Administered 2016-12-05 – 2016-12-06 (×2): 75 mL/h via INTRAVENOUS
  Administered 2016-12-06 – 2016-12-09 (×3): 125 mL/h via INTRAVENOUS
  Filled 2016-12-04 (×14): qty 1000

## 2016-12-04 NOTE — Progress Notes (Signed)
[facility] Therapy Treatment Record Phone [phone]   Radiation Therapy was administered to David Clarke on: 12/04/2016  10:28 AM and was treatment # 19 out of a planned course of 30 treatments.  Radiation Treatment  1). Beam photons with 6-10 energy and Photons 6-10 MeV  2). Brachytherapy None  3). Stereotactic Radiosurgery None  4). Other Radiation None     Jacques Earthly, RT (T)

## 2016-12-04 NOTE — Progress Notes (Addendum)
[facility] PROGRESS NOTE  David Clarke TDD:[id number] DOB: 14-Jan-1956 DOA: 12/03/2016  PCP: Robyn Haber, MD  Brief History/Interval Summary: 61 year old Caucasian male with a past medical history of anorectal cancer on Xeloda also receiving radiation treatment. This cancer was diagnosed in February. He presented with few days to one week history of diarrhea with 6-7 loose stools daily, along with a fever of 101.5 on the day of admission. Patient was also noted to be hypokalemic, hyponatremic. Hospitalized for further management.  Reason for Visit: Acute diarrhea. Hypokalemia and hyponatremia.  Consultants: Radiation oncologist, Dr. Lisbeth Renshaw. Medical oncologist Dr. Burr Medico  Procedures: None  Antibiotics: None  Subjective/Interval History: Patient feels somewhat better this morning compared to yesterday. Continues to have loose stools, although not as frequently as yesterday. Denies any blood in the stools. Denies any nausea, vomiting. Denies any abdominal pain. He does state that his skin feels raw in his buttock area.  ROS: Denies any shortness of breath or chest pain.  Objective:  Vital Signs  Vitals:   12/03/16 1900 12/03/16 2033 12/04/16 0300 12/04/16 0544  BP:  123/68  116/67  Pulse:  87  86  Resp:  16  16  Temp: (!) 102.8 F (39.3 C) 100.2 F (37.9 C) (!) 101.6 F (38.7 C) 99.8 F (37.7 C)  TempSrc: Oral Oral Oral Oral  SpO2:  100%  99%  Weight:      Height:        Intake/Output Summary (Last 24 hours) at 12/04/16 1158 Last data filed at 12/03/16 1800  Gross per 24 hour  Intake           218.75 ml  Output                0 ml  Net           218.75 ml   Filed Weights   12/03/16 1607  Weight: 62.1 kg (137 lb)    General appearance: alert, cooperative, appears stated age and no distress Resp: clear to auscultation bilaterally Cardio: regular rate and rhythm, S1, S2 normal, no murmur, click, rub or gallop GI: soft, non-tender; bowel sounds normal; no  masses,  no organomegaly Extremities: extremities normal, atraumatic, no cyanosis or edema Skin: Erythematous along the buttock area and the perineal area Neurologic: Awake, alert. Oriented 3. No focal neurological deficits  Lab Results:  Data Reviewed: I have personally reviewed following labs and imaging studies  CBC:  Recent Labs Lab 12/03/16 1135 12/04/16 0606  WBC 3.0* 2.2*  NEUTROABS 2.5  --   HGB 10.4* 9.0*  HCT 31.4* 27.1*  MCV 80.5 81.4  PLT 272 623    Basic Metabolic Panel:  Recent Labs Lab 12/03/16 1135 12/04/16 0606  NA 126* 127*  K 2.5* 3.0*  CL 92* 97*  CO2 27 24  GLUCOSE 141* 127*  BUN 9 8  CREATININE 0.64 0.64  CALCIUM 7.7* 6.8*  MG 1.4* 2.2    GFR: Estimated Creatinine Clearance: 85.2 mL/min (by C-G formula based on SCr of 0.64 mg/dL).  Liver Function Tests:  Recent Labs Lab 12/03/16 1135  AST 17  ALT 19  ALKPHOS 61  BILITOT 0.8  PROT 5.4*  ALBUMIN 2.6*    Coagulation Profile:  Recent Labs Lab 12/03/16 1135  INR 1.22     Recent Results (from the past 240 hour(s))  C difficile quick scan w PCR reflex     Status: None   Collection Time: 12/03/16  1:02 PM  Result Value Ref Range  Status   C Diff antigen NEGATIVE NEGATIVE Final   C Diff toxin NEGATIVE NEGATIVE Final   C Diff interpretation No C. difficile detected.  Final      Radiology Studies: Dg Chest 2 View  Result Date: 12/03/2016 CLINICAL DATA:  Intermittent fever, current chemotherapy and radiation therapy for rectal tumor. EXAM: CHEST  2 VIEW COMPARISON:  None. FINDINGS: Heart size and mediastinal contours are normal. Lungs are hyperexpanded. Lungs are clear. Nodular density projected over the left upper lung, measuring 10 mm, is compatible with a benign bone island demonstrated on CT of 10/13/2016. No pleural effusion or pneumothorax seen. No acute or suspicious osseous finding. IMPRESSION: 1. No active cardiopulmonary disease. No evidence of pneumonia or pulmonary  edema. No evidence of intrathoracic metastasis. 2. Hyperexpanded lungs suggesting COPD. Electronically Signed   By: Franki Cabot M.D.   On: 12/03/2016 12:05     Medications:  Scheduled: . enoxaparin (LOVENOX) injection  40 mg Subcutaneous Q24H  . feeding supplement (ENSURE ENLIVE)  237 mL Oral BID BM  . multivitamin with minerals  1 tablet Oral Daily  . nystatin  5 mL Oral QID  . protein supplement shake  11 oz Oral BID BM  . sodium chloride flush  3 mL Intravenous Q12H   Continuous: . 0.9 % NaCl with KCl 40 mEq / L 100 mL/hr (12/04/16 1127)   YQM:[id number] **OR** acetaminophen, diphenoxylate-atropine  Assessment/Plan:  Principal Problem:   Hyponatremia Active Problems:   Rectal adenocarcinoma    Iron deficiency anemia due to chronic blood loss   Hypokalemia   Leukopenia   Prolonged Q-T interval on ECG   Thrush    Acute Diarrhea with fever Reason for his diarrhea is likely multifactorial including toxicity from Xeloda, possible radiation induced proctitis, viral syndrome. C. difficile was negative. GI pathogen panel is pending. He does have fever. Continue to monitor for now. Await stool studies. Continue Lomotil. Does have fever. Did have bands. Lactic acid level was however normal. He is hemodynamically stable. Reasonable to continue to watch him off of antibiotics.  ADDENDUM GI Pathogen panel positive for yersinia. Will initiate Cipro. EKG repeated and QT interval now normal.  Hyponatremia Most likely hypovolemic hyponatremia considering his GI symptoms. Patient was given D5 normal saline. Sodium has improved only slightly. Change to just normal saline. Check urine osmolality and urine sodium, although results may not truly reflect his underlying etiology since it's been done only now.   Hypokalemia/ hypomagnesemia/Prolonged Q-T interval on ECG  Potassium has improved. Continue to replete. Magnesium is improved. Follow on telemetry.  Rectal  adenocarcinoma/Radiation burns on sacral area On Xeloda which may be the cause of his diarrhea. Dr Alvy Bimler recommends stopping it for now. Discussed with patient's oncologist as well who will evaluate patient today. Patient is also receiving radiation treatment. He does have some evidence for radiation injury to the skin around the perineal area. Discussed with Dr. Lisbeth Renshaw, who will evaluate patient.   Oral Thrush Nystatin- hold off on starting Diflucan as QTprolonged  Iron deficiency anemia due to chronic blood loss/leukopenia Abdomen is slightly lower today compared to yesterday. This could be just due to dilution. No evidence for overt bleeding. Continue to monitor closely. Noted to be leukopenic as well , which is probably due to Xeloda. Does have fever. Did have bands. Lactic acid level was however normal. He is hemodynamically stable. Reasonable to continue to watch him off of antibiotics.  DVT Prophylaxis: On Lovenox Code Status: Full code  Family Communication:  Discussed with the patient, his partner and his daughter with his permission  Disposition Plan: Management as outlined above.    LOS: 1 day   [facility] Pager [phone] 12/04/2016, 11:58 AM  If 7PM-7AM, please contact night-coverage at [email or web address], password [facility]

## 2016-12-04 NOTE — Progress Notes (Signed)
Initial Nutrition Assessment  INTERVENTION:   Continue Ensure Enlive po BID, each supplement provides 350 kcal and 20 grams of protein Provide Premier Protein BID, each supplement provides 160kcal and 30g protein.  Encourage PO intakes  RD to continue to monitor for needs  NUTRITION DIAGNOSIS:   Increased nutrient needs related to cancer and cancer related treatments as evidenced by estimated needs.  GOAL:   Patient will meet greater than or equal to 90% of their needs  MONITOR:   PO intake, Supplement acceptance, Labs, Weight trends, I & O's  REASON FOR ASSESSMENT:   Malnutrition Screening Tool    ASSESSMENT:   61 y.o. male with medical history of anorectal cancer on Xeloda who presents with diarrhea 6-7 episodes a day and a fever of 101.5 today. He has been having this for 3-4 days. No nausea or vomiting. No abdominal pain. No blood on stool. Has been feeling lightheaded with fever. Quite weak. Poor appetite and feels dehydrated. Making small amounts of dark urine  Pt in room with no family at bedside. Pt followed by [place] RD, last seen 4/3. At that visit, pt states he received information about a low fiber diet which he is trying to follow here. States he does not like the food but is eating anyway. Pt continues to have frequent diarrhea. He is drinking plenty of fluids like Gatorade. Just drank an Ensure supplement but states he wants to try Premier Protein. RD was approached by RN for this request prior to visit. RD ordered pt Premier Protein to try. Pt with no other requests at this time.   Per patient, UBW is 150 lb but unsure when he last weighed this much. Nutrition focused physical exam shows no sign of depletion of muscle mass or body fat.  Medications: MAG-OX tablet BID (d/c), Multivitamin with minerals daily, K-DUR once Labs reviewed: Low Na, K Mg WNL  Diet Order:  Diet regular Room service appropriate? Yes; Fluid consistency: Thin  Skin:  Reviewed, no  issues  Last BM:  4/9  Height:   Ht Readings from Last 1 Encounters:  12/03/16 6' (1.829 m)    Weight:   Wt Readings from Last 1 Encounters:  12/03/16 137 lb (62.1 kg)    Ideal Body Weight:  80.9 kg  BMI:  Body mass index is 18.58 kg/m.  Estimated Nutritional Needs:   Kcal:  1900-2100  Protein:  90-100g  Fluid:  >2.1L/day  EDUCATION NEEDS:   No education needs identified at this time  Clayton Bibles, MS, RD, LDN Pager: [phone] After Hours Pager: [phone]

## 2016-12-04 NOTE — Progress Notes (Addendum)
[place]  Telephone:([phone]) [phone] Fax:(336) [phone]  Clinic Follow Up Note   Patient Care Team: Robyn Haber, MD as PCP - General (Family Medicine) Michael Boston, MD as Consulting Physician (General Surgery) Jerene Bears, MD as Consulting Physician (Gastroenterology) Truitt Merle, MD as Consulting Physician (Hematology) Kyung Rudd, MD as Consulting Physician (Radiation Oncology) 12/11/2016  CHIEF COMPLAINTS:  Rectal adenocarcinoma   Oncology History   Cancer Staging Rectal adenocarcinoma  Staging form: Colon and Rectum, AJCC 8th Edition - Clinical stage from 10/09/2016: Stage IIIB (cT3, cN1b, cM0) - Signed by Truitt Merle, MD on 10/25/2016       Rectal adenocarcinoma    10/03/2016 - 10/03/2016 Hospital Admission    Admit date: 10/03/16 The patient presented to the [facility] [place] ED with rectal bleeding and difficulty with hygiene.      10/09/2016 Initial Diagnosis    Rectal adenocarcinoma       10/09/2016 Procedure    Colonoscopy showed a fungating, infiltrative and ulcerated partially obstructing large mass in the distal rectum, measuring 9 cm, prolapsing at anal canal. Scope not advanced proximal to the rectosigmoid colon.      10/09/2016 Initial Biopsy    Rectal mass biopsy showed invasive adenocarcinoma.      10/13/2016 Imaging    CT Chest Abdomen Pelvis w contrast IMPRESSION: Large irregular enhancing rectal mass consistent with known rectal cancer. There are small perirectal and small pelvic sidewall lymph nodes which are suspicious. Prominent perirectal vessels are also noted. No evidence of metastatic retroperitoneal or sigmoid mesocolon adenopathy or hepatic or pulmonary metastasis.      10/24/2016 Imaging    MRI Pelvis IMPRESSION: 1. Moderately motion degraded exam. 2. Large mass is centered at the anus with extension into the low rectum. No complicating obstruction. 3. Mild mesorectal adenopathy, suspicious. 4. Right inguinal hernia containing  nonobstructive small bowel.       11/08/2016 -  Chemotherapy    Xeloda 50 mg twice daily, with concurrent radiation, held since 12/03/2016 due to colitis and hospitalization       11/08/2016 -  Radiation Therapy    Neoadjuvant radiation to his rectal cancer      12/03/2016 - 12/09/2016 Hospital Admission    He presented with few days to one week history of diarrhea with 6-7 loose stools daily, along with a fever of 101.5 on the day of admission. Patient was also noted to be hypokalemic, hyponatremic. Hospitalized for further management. Stool studies were sent. C. difficile was negative. GI pathogen panel was positive for Yersinia.  XELODA was held at this time, until infection has cleared.       HISTORY OF PRESENTING ILLNESS (10/25/16):  David Clarke 61 y.o. male is here because of his recently diagnosed rectal adenocarcinoma. He was referred by his gastroenterologist title parietal. Patient presents to my clinic, accompanied by his wife and his daughter.       He has noticed a anal mass accompanied by intermittent bleeding and pain in the rectum for the past 5 months. The patient initially presented to the [facility] ED on 10/03/16 with rectal bleeding and difficulty with hygeine. He was without health insurance previously and so did not pursue evaluation or treatment until this time.  The patient was seen by Dr. Hilarie Fredrickson for a colonoscopy and biopsy on 10/09/16, revealing a large partially obstructing low rectal mass, prolapsing to anal verge. Biopsy showed adenocarcinoma. CT C/A/P on 10/13/16 revealed a large, irregular, enhancing mass consistent with known rectal cancer. There  were several suspicious small perirectal and small pelvic sidewall lymph nodes. Prominent perirectal vessels were also noted. MRI of the pelvis on 10/24/16 showed a large mass centered at the anus with extension into the low rectum without obstruction. There was suspicious mild mesorectal adenopathy, and a right inguinal hernia  containing nonobstructive small bowel.   The patient comes to the clinic for consultation today accompanied by his partner and daughter. He denies abdominal cramps, nausea, bowel or bladder concerns. He reports taking OTC stool softener as needed. He reports his appetite is good, but he has lost 12-15 lbs in the past few weeks. His daughter notes she has hemochromatosis and questions if this could be playing a role in the patient's anemia.  CURRENT THERAPY: Current radiation and chemotherapy with Xeloda 1500 mg twice daily on the day of radiation, started on 11/08/2016, held from 12/03/2016 due to hospitalization   INTERIM HISTORY: David Clarke returns for follow up. He was in the hospital this past week; he went home on Saturday. He says he feels the best he has in a while, since leaving the hospital. He has been having 15-16 episodes of diarrhea a day, usually about an hour apart. He has been drinking plenty of water. Last night, he did have fewer episodes than usual. Today he has had some episodes, but they have been much more mild. While at the hospital, he was taken off of imodium due to the medication he was put on for his viral infection, but would like to start it again soon if possible. He is on day 7 of the medication. Today he had a mild fever. He has lost 10 pounds. His appetite has been better since leaving the hospital. Denies any other concerns.   MEDICAL HISTORY:  Past Medical History:  Diagnosis Date  . Allergy   . Cancer ([place]) 10/09/2016   rectal adenocarcinoma    SURGICAL HISTORY: Past Surgical History:  Procedure Laterality Date  . COLONOSCOPY W/ BIOPSIES Bilateral 10/09/2016   rectal adenocarcinoma  . MOUTH SURGERY     age 5, to remove extra "eye" teeth  . [place] teeth extraction      SOCIAL HISTORY: Social History   Social History  . Marital status: Single    Spouse name: N/A  . Number of children: 1  . Years of education: N/A   Occupational History  .  restaurant/caterer    Social History Main Topics  . Smoking status: Former Smoker    Years: 2.00    Types: Cigarettes    Quit date: 2002  . Smokeless tobacco: Never Used  . Alcohol use 6.0 oz/week    4 Glasses of wine, 6 Cans of beer per week     Comment: beer or a glass or wine most days of the week  . Drug use: No  . Sexual activity: Yes    Partners: Female   Other Topics Concern  . Not on file   Social History Narrative  . No narrative on file    FAMILY HISTORY: Family History  Problem Relation Age of Onset  . Other Mother     bile duct tumor  . Stroke Mother   . Cancer Mother 80    bile duct cancer   . Prostate cancer Father 36  . Emphysema Father   . Colon cancer Neg Hx   . Esophageal cancer Neg Hx   . Pancreatic cancer Neg Hx   . Rectal cancer Neg Hx   . Stomach cancer Neg Hx  ALLERGIES:  is allergic to sulfa antibiotics.  MEDICATIONS:  Current Outpatient Prescriptions  Medication Sig Dispense Refill  . ciprofloxacin (CIPRO) 500 MG tablet Take 1 tablet (500 mg total) by mouth 2 (two) times daily. 20 tablet 0  . capecitabine (XELODA) 500 MG tablet Take 3 tablets (1,500 mg total) by mouth 2 (two) times daily after a meal. Refill 60 tabs only. (Patient not taking: Reported on 12/11/2016) 60 tablet 0  . Multiple Vitamins-Minerals (MULTIVITAMIN ADULT PO) Take 1 tablet by mouth daily.    . potassium chloride SA (K-DUR,KLOR-CON) 20 MEQ tablet Take 1 tablet (20 mEq total) by mouth 2 (two) times daily. 60 tablet 1   Current Facility-Administered Medications  Medication Dose Route Frequency Provider Last Rate Last Dose  . 0.9 %  sodium chloride infusion  500 mL Intravenous Continuous Jerene Bears, MD        REVIEW OF SYSTEMS:   Constitutional: Denies fevers, chills or abnormal night sweats (+) fever (+) weight loss Eyes: Denies blurriness of vision, double vision or watery eyes Ears, nose, mouth, throat, and face: Denies mucositis or sore throat  Respiratory:  Denies cough, dyspnea or wheezes Cardiovascular: Denies palpitation, chest discomfort or lower extremity swelling Gastrointestinal:  Denies nausea, heartburn or change in bowel habits (+) severe diarrhea  Skin: Denies abnormal skin rashes Lymphatics: Denies new lymphadenopathy or easy bruising Neurological:Denies numbness, tingling or new weaknesses Behavioral/Psych: Mood is stable, no new changes  All other systems were reviewed with the patient and are negative.   PHYSICAL EXAMINATION: ECOG PERFORMANCE STATUS: 1 - Symptomatic but completely ambulatory  Vitals:   12/11/16 1241 12/11/16 1242  BP: 107/72 107/72  Pulse: 94 94  Resp: 18 18  Temp: 98.1 F (36.7 C) 98.1 F (36.7 C)   Filed Weights   12/11/16 1241 12/11/16 1242  Weight: 125 lb (56.7 kg) 125 lb (56.7 kg)   GENERAL:alert, no distress and comfortable.  SKIN: skin color, texture, turgor are normal, no rashes or significant lesions NECK: supple, thyroid normal size, non-tender, without nodularity LYMPH:  no palpable lymphadenopathy in the cervical, axillary or inguinal LUNGS: clear to auscultation and percussion with normal breathing effort HEART: regular rate & rhythm and no murmurs and no lower extremity edema ABDOMEN:abdomen soft, non-tender and normal bowel sounds Musculoskeletal:no cyanosis of digits and no clubbing  PSYCH: alert & oriented x 3 with fluent speech NEURO: no focal motor/sensory deficits.  RECTAL: there is a large prolapsing mass at his anal verge, no bleeding, Rectal exam was not performed.   LABORATORY DATA:  I have reviewed the data as listed CBC Latest Ref Rng & Units 12/11/2016 12/08/2016 12/07/2016  WBC 4.0 - 10.3 10e3/uL 7.9 4.5 3.6(L)  Hemoglobin 13.0 - 17.1 g/dL 9.9(L) 8.6(L) 8.4(L)  Hematocrit 38.4 - 49.9 % 30.7(L) 26.2(L) 25.3(L)  Platelets 140 - 400 10e3/uL 423(H) 356 332   CMP Latest Ref Rng & Units 12/11/2016 12/09/2016 12/08/2016  Glucose 70 - 140 mg/dl 134 122(H) 108(H)  BUN 7.0 - 26.0  mg/dL 4.4(L) <5(L) 6  Creatinine 0.7 - 1.3 mg/dL 0.7 0.59(L) 0.56(L)  Sodium 136 - 145 mEq/L 133(L) 133(L) 132(L)  Potassium 3.5 - 5.1 mEq/L 2.8(LL) 3.4(L) 3.5  Chloride 101 - 111 mmol/L - 107 105  CO2 22 - 29 mEq/L 21(L) 21(L) 21(L)  Calcium 8.4 - 10.4 mg/dL 8.0(L) 7.5(L) 7.3(L)  Total Protein 6.4 - 8.3 g/dL 5.1(L) - -  Total Bilirubin 0.20 - 1.20 mg/dL 0.42 - -  Alkaline Phos 40 - 150  U/L 91 - -  AST 5 - 34 U/L 17 - -  ALT 0 - 55 U/L 29 - -   CEA ([facility]) 10/27/2016: 7.81 11/27/2016: 5.56  PATHOLOGY:  Diagnosis 10/09/2016 Rectum, biopsy, mass - ADENOCARCINOMA. Microscopic Comment The features are consistent with colorectal-type adenocarcinoma.  RADIOGRAPHIC STUDIES: I have personally reviewed the radiological images as listed and agreed with the findings in the report.  DG Chest 2 View 12/03/2016 IMPRESSION: 1. No active cardiopulmonary disease. No evidence of pneumonia or pulmonary edema. No evidence of intrathoracic metastasis. 2. Hyperexpanded lungs suggesting COPD.  MRI Pelvis 10/24/2016 IMPRESSION: 1. Moderately motion degraded exam. 2. Large mass is centered at the anus with extension into the low rectum. No complicating obstruction. 3. Mild mesorectal adenopathy, suspicious. 4. Right inguinal hernia containing nonobstructive small bowel.  CT CAP w Contrast 10/13/2016 IMPRESSION: Large irregular enhancing rectal mass consistent with known rectal cancer. There are small perirectal and small pelvic sidewall lymph nodes which are suspicious. Prominent perirectal vessels are also noted.  No evidence of metastatic retroperitoneal or sigmoid mesocolon adenopathy or hepatic or pulmonary metastasis.  COLONOSOCPY 10/09/2016 Dr. Hilarie Fredrickson  A fungating, infiltrative and ulcerated partially obstructing large mass was found in the distal rectum. The mass was circumferential. The mass measured seven cm in length (prolapsing and from dentate line to approx 7 cm). In  addition, its inner diameter measured nine mm. Oozing was present. This was biopsied with a cold forceps for histology. - Due to the distal rectal mass the bowel preparation was poor and unable to be cleared via the adult upper endoscope (adult upper endoscope used due to obstructing mass). Scope not advanced proximal to the rectosigmoid colon.  ASSESSMENT & PLAN: 61 y.o.  gentleman, previously healthy, presented with a anal mass and a mild intermittent rectal bleeding for 5-6 months. Fatigue, anorexia, fatigue and weight loss for a month.   1) Rectal Adenocarcinoma, low rectum, MO2H4TM5, stage IIIB, MSI-pending  -I previously reviewed his CT and MRI scan, colonoscopy and biopsy findings in detail with pt and his family members -Based on his imaging findings, he has clinical stage III disease. We reviewed his image in our GI tumor Board this morning, he also has a small liver lesion which is indeterminate, likely benign, and we will follow up with a repeated CT in about 3 months.  -We previously discussed the nature history of rectal cancer, high risk of cancer recurrence for locally advanced disease, and the standard therapy for stage III disease, which is neoadjuvant chemotherapy and irradiation, followed by definitive surgery (likely APR per Dr. Johney Maine), and adjuvant chemo -We previously discussed the chemo options with concurrent RT, including Xeloda 824m/m2, which he would take twice a day on days he receives radiation.   -The patient has started concurrent chemoradiation with Xeloda, tolerating well. Unfortunately he developed severe diarrhea from Yersinia, Xeloda has been held, he continued radiation  -his diarrhea has improved some, he is recovering from this episode, will continue holding Xeloda for this week, while he continues radiation -lab and f/u in one week. He will complete radiation next Tuesday 4/24  2. Yersinia enterocolitia colitis  -he was hospitalized for a week, slowly  recovering -he will finish cipro late this week -OK to use imodium from tomorrow and on, since he has been on Abx for 7 days and his infection should be cleared by now  -VS and Cr normal, does not appear to be dehydrated, he is drinking fluids adequately   3.  Weight loss and dietary concerns. -I referred the patient to speak Ernestene Kiel in Nutrition.  -I encouraged the patient to increase his calorie and protein intake. He will stay well hydrated. -I encouraged the patient to maintain regular exercise as he is able to improve his energy levels. -he last 10lbs from his recent episode of infectious colitis   4. 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 -Hemoglobin was 8.3 initially with low MCV, likely anemia of iron deficiency from chronic blood loss. This was confirmed by his iron study. -His daughter was diagnosed with hemochromatosis (homozygous), his genetic testing showed he is a carrier -He received IV iron for him once, I'll be cautious about IV iron replacement due to his hemachromatosis carrier status. -repeated iron study still showed low iron, his anemia has gotten worse lately, I recommend him to take oral iron 1-2 tab a day for a month  5. Fatigue -Likely the combination of anemia, chemotherapy and radiation. -worse since his infectious colitis. I encouraged him to remain to be physically active.  6. Hypokalemia -K 2.8 today, secondary to diarrhea -I called in KCL 35mq, he will take twice daily starting today, three times tomorrow -repeat lab next week   PBartowfor now, continue radiation  -Prescribe potassium pill -Start iron pill -Restart Imodium tomorrow  -The patient will return for follow up in one week with lab.    All questions were answered. The patient knows to call the  clinic with any problems, questions or concerns.  I spent 20 minutes counseling the patient face to face. The total time spent in the appointment was 25 minutes and more than 50% was on counseling.  This document serves as a record of services personally performed by YTruitt Merle MD. It was created on her behalf by [email or web address], a trained medical scribe. The creation of this record is based on the scribe's personal observations and the provider's statements to them. This document has been checked and approved by the attending provider.  I have reviewed the above documentation for accuracy and completeness and I agree with the above.   FTruitt Merle MD 12/11/2016

## 2016-12-04 NOTE — Progress Notes (Signed)
David Clarke   DOB:November 08, 1955   ML#:[id number]   KCL#:[id number]  ONCOLOGY FOLLOW UP NOTE   Subjective: The patient is well-known to me, under my care for his recent diagnosed rectal cancer. He is on concurrent chemotherapy and radiation with Xeloda. He was admitted yesterday for fever and diarrhea. He had radiation today, still quite fatigued, had watery BM 5-6 times today, still febrile this morning.    Objective:  Vitals:   12/04/16 1522 12/04/16 2026  BP: 135/68 132/76  Pulse: 93 (!) 105  Resp: 20 20  Temp: (!) 103 F (39.4 C) 99.5 F (37.5 C)    Body mass index is 18.58 kg/m.  Intake/Output Summary (Last 24 hours) at 12/04/16 2141 Last data filed at 12/04/16 1842  Gross per 24 hour  Intake             2285 ml  Output                0 ml  Net             2285 ml     Sclerae unicteric  Oropharynx clear  No peripheral adenopathy  Lungs clear -- no rales or rhonchi  Heart regular rate and rhythm  Abdomen benign  MSK no focal spinal tenderness, no peripheral edema  Neuro nonfocal   CBG (last 3)  No results for input(s): GLUCAP in the last 72 hours.   Labs:  Lab Results  Component Value Date   WBC 2.2 (L) 12/04/2016   HGB 9.0 (L) 12/04/2016   HCT 27.1 (L) 12/04/2016   MCV 81.4 12/04/2016   PLT 260 12/04/2016   NEUTROABS 1.8 12/04/2016   CMP Latest Ref Rng & Units 12/04/2016 12/03/2016 11/27/2016  Glucose 65 - 99 mg/dL 127(H) 141(H) 102  BUN 6 - 20 mg/dL 8 9 11.4  Creatinine 0.61 - 1.24 mg/dL 0.64 0.64 0.7  Sodium 135 - 145 mmol/L 127(L) 126(L) 134(L)  Potassium 3.5 - 5.1 mmol/L 3.0(L) 2.5(LL) 3.7  Chloride 101 - 111 mmol/L 97(L) 92(L) -  CO2 22 - 32 mmol/L 24 27 26   Calcium 8.9 - 10.3 mg/dL 6.8(L) 7.7(L) 8.4  Total Protein 6.5 - 8.1 g/dL - 5.4(L) 5.4(L)  Total Bilirubin 0.3 - 1.2 mg/dL - 0.8 0.31  Alkaline Phos 38 - 126 U/L - 61 69  AST 15 - 41 U/L - 17 11  ALT 17 - 63 U/L - 19 10     Urine Studies No results for input(s): UHGB, CRYS in the last 72  hours.  Invalid input(s): UACOL, UAPR, USPG, UPH, UTP, UGL, UKET, UBIL, UNIT, UROB, ULEU, UEPI, UWBC, URBC, UBAC, CAST, UCOM, BILUA  Basic Metabolic Panel:  Recent Labs Lab 12/03/16 1135 12/04/16 0606  NA 126* 127*  K 2.5* 3.0*  CL 92* 97*  CO2 27 24  GLUCOSE 141* 127*  BUN 9 8  CREATININE 0.64 0.64  CALCIUM 7.7* 6.8*  MG 1.4* 2.2   GFR Estimated Creatinine Clearance: 85.2 mL/min (by C-G formula based on SCr of 0.64 mg/dL). Liver Function Tests:  Recent Labs Lab 12/03/16 1135  AST 17  ALT 19  ALKPHOS 61  BILITOT 0.8  PROT 5.4*  ALBUMIN 2.6*   No results for input(s): LIPASE, AMYLASE in the last 168 hours. No results for input(s): AMMONIA in the last 168 hours. Coagulation profile  Recent Labs Lab 12/03/16 1135  INR 1.22    CBC:  Recent Labs Lab 12/03/16 1135 12/04/16 0606  WBC 3.0* 2.2*  NEUTROABS 2.5  1.8  HGB 10.4* 9.0*  HCT 31.4* 27.1*  MCV 80.5 81.4  PLT 272 260   Cardiac Enzymes: No results for input(s): CKTOTAL, CKMB, CKMBINDEX, TROPONINI in the last 168 hours. BNP: Invalid input(s): POCBNP CBG: No results for input(s): GLUCAP in the last 168 hours. D-Dimer No results for input(s): DDIMER in the last 72 hours. Hgb A1c No results for input(s): HGBA1C in the last 72 hours. Lipid Profile No results for input(s): CHOL, HDL, LDLCALC, TRIG, CHOLHDL, LDLDIRECT in the last 72 hours. Thyroid function studies No results for input(s): TSH, T4TOTAL, T3FREE, THYROIDAB in the last 72 hours.  Invalid input(s): FREET3 Anemia work up No results for input(s): VITAMINB12, FOLATE, FERRITIN, TIBC, IRON, RETICCTPCT in the last 72 hours. Microbiology Recent Results (from the past 240 hour(s))  C difficile quick scan w PCR reflex     Status: None   Collection Time: 12/03/16  1:02 PM  Result Value Ref Range Status   C Diff antigen NEGATIVE NEGATIVE Final   C Diff toxin NEGATIVE NEGATIVE Final   C Diff interpretation No C. difficile detected.  Final   Gastrointestinal Panel by PCR , Stool     Status: Abnormal   Collection Time: 12/03/16  1:02 PM  Result Value Ref Range Status   Campylobacter species NOT DETECTED NOT DETECTED Final   Plesimonas shigelloides NOT DETECTED NOT DETECTED Final   Salmonella species NOT DETECTED NOT DETECTED Final   Yersinia enterocolitica DETECTED (A) NOT DETECTED Final    Comment: RESULT CALLED TO, READ BACK BY AND VERIFIED WITH: ABBY PINNIX 12/04/16 @ 1414  MLK    Vibrio species NOT DETECTED NOT DETECTED Final   Vibrio cholerae NOT DETECTED NOT DETECTED Final   Enteroaggregative E coli (EAEC) NOT DETECTED NOT DETECTED Final   Enteropathogenic E coli (EPEC) NOT DETECTED NOT DETECTED Final   Enterotoxigenic E coli (ETEC) NOT DETECTED NOT DETECTED Final   Shiga like toxin producing E coli (STEC) NOT DETECTED NOT DETECTED Final   Shigella/Enteroinvasive E coli (EIEC) NOT DETECTED NOT DETECTED Final   Cryptosporidium NOT DETECTED NOT DETECTED Final   Cyclospora cayetanensis NOT DETECTED NOT DETECTED Final   Entamoeba histolytica NOT DETECTED NOT DETECTED Final   Giardia lamblia NOT DETECTED NOT DETECTED Final   Adenovirus F40/41 NOT DETECTED NOT DETECTED Final   Astrovirus NOT DETECTED NOT DETECTED Final   Norovirus GI/GII NOT DETECTED NOT DETECTED Final   Rotavirus A NOT DETECTED NOT DETECTED Final   Sapovirus (I, II, IV, and V) NOT DETECTED NOT DETECTED Final      Studies:  Dg Chest 2 View  Result Date: 12/03/2016 CLINICAL DATA:  Intermittent fever, current chemotherapy and radiation therapy for rectal tumor. EXAM: CHEST  2 VIEW COMPARISON:  None. FINDINGS: Heart size and mediastinal contours are normal. Lungs are hyperexpanded. Lungs are clear. Nodular density projected over the left upper lung, measuring 10 mm, is compatible with a benign bone island demonstrated on CT of 10/13/2016. No pleural effusion or pneumothorax seen. No acute or suspicious osseous finding. IMPRESSION: 1. No active cardiopulmonary  disease. No evidence of pneumonia or pulmonary edema. No evidence of intrathoracic metastasis. 2. Hyperexpanded lungs suggesting COPD. Electronically Signed   By: Franki Cabot M.D.   On: 12/03/2016 12:05    Assessment: 61 y.o. with rectal cancer, on concurrent chemotherapy and radiation, admitted for fever and diarrhea.  1. Fever and diarrhea, (+) Yersinia enterocolitica in stool  2. Leukopenia, possible neutropenia  3. Anemia of iron deficiency, and secondary to  chemo 4. Hyponatremia and hypokalemia 5. Stage III rectal cancer, on chemoRT  6. Moderate calorie and protein malnutrition  Plan:  -he has started cipro for yerisinia colitis  -continue supportive care -will hold Xeloda this week -please check WBC diff daily. Not able to give GCSF even if he is neutroepnic due to radiation. But if [place] very low, may need to hold his RT, Dr. Lisbeth Renshaw will decide on his RT treatment  -I will follow up   Truitt Merle, MD 12/04/2016  9:41 PM

## 2016-12-05 ENCOUNTER — Ambulatory Visit
Admission: RE | Admit: 2016-12-05 | Discharge: 2016-12-05 | Disposition: A | Discharge status: Home / Self Care | Payer: BLUE CROSS/BLUE SHIELD | Source: Ambulatory Visit | Attending: Radiation Oncology | Admitting: Radiation Oncology

## 2016-12-05 ENCOUNTER — Ambulatory Visit: Payer: BLUE CROSS/BLUE SHIELD

## 2016-12-05 ENCOUNTER — Ambulatory Visit: Payer: BLUE CROSS/BLUE SHIELD | Admitting: Hematology

## 2016-12-05 LAB — CBC WITH DIFFERENTIAL/PLATELET
BASOS PCT: 0 %
Basophils Absolute: 0 10*3/uL (ref 0.0–0.1)
EOS ABS: 0 10*3/uL (ref 0.0–0.7)
EOS PCT: 0 %
HEMATOCRIT: 26.1 % — AB (ref 39.0–52.0)
HEMOGLOBIN: 8.4 g/dL — AB (ref 13.0–17.0)
LYMPHS ABS: 0.3 10*3/uL — AB (ref 0.7–4.0)
Lymphocytes Relative: 7 %
MCH: 25.8 pg — AB (ref 26.0–34.0)
MCHC: 32.2 g/dL (ref 30.0–36.0)
MCV: 80.3 fL (ref 78.0–100.0)
MONO ABS: 0.3 10*3/uL (ref 0.1–1.0)
Monocytes Relative: 7 %
NEUTROS ABS: 3.2 10*3/uL (ref 1.7–7.7)
Neutrophils Relative %: 86 %
Platelets: 273 10*3/uL (ref 150–400)
RBC: 3.25 MIL/uL — ABNORMAL LOW (ref 4.22–5.81)
RDW: 28.9 % — ABNORMAL HIGH (ref 11.5–15.5)
WBC: 3.8 10*3/uL — ABNORMAL LOW (ref 4.0–10.5)

## 2016-12-05 LAB — BASIC METABOLIC PANEL
Anion gap: 4 — ABNORMAL LOW (ref 5–15)
BUN: 8 mg/dL (ref 6–20)
CALCIUM: 7.1 mg/dL — AB (ref 8.9–10.3)
CO2: 25 mmol/L (ref 22–32)
CREATININE: 0.67 mg/dL (ref 0.61–1.24)
Chloride: 100 mmol/L — ABNORMAL LOW (ref 101–111)
GFR calc Af Amer: >60 mL/min (ref >60)
GLUCOSE: 115 mg/dL — AB (ref 65–99)
POTASSIUM: 4 mmol/L (ref 3.5–5.1)
Sodium: 129 mmol/L — ABNORMAL LOW (ref 135–145)

## 2016-12-05 LAB — HIV ANTIBODY (ROUTINE TESTING W REFLEX): HIV Screen 4th Generation wRfx: NONREACTIVE

## 2016-12-05 LAB — MAGNESIUM: Magnesium: 2 mg/dL (ref 1.7–2.4)

## 2016-12-05 MED ORDER — LOPERAMIDE HCL 2 MG PO CAPS
2.0000 mg | ORAL_CAPSULE | Freq: Four times a day (QID) | ORAL | Status: DC | PRN
  Administered 2016-12-05 – 2016-12-08 (×8): 2 mg via ORAL
  Filled 2016-12-05 (×8): qty 1

## 2016-12-05 NOTE — Progress Notes (Signed)
[facility] Therapy Treatment Record Phone [phone]   Radiation Therapy was administered to David Clarke on: 12/05/2016  12:38 PM and was treatment # 20 out of a planned course of 30 treatments.  Radiation Treatment  1). Beam photons with 6-10 energy and Photons 6-10 MeV  2). Brachytherapy None  3). Stereotactic Radiosurgery None  4). Other Radiation None     Jacques Earthly, RT (T)

## 2016-12-05 NOTE — Progress Notes (Signed)
[facility] PROGRESS NOTE  David Clarke XVQ:[id number] DOB: 15-Apr-1956 DOA: 12/03/2016  PCP: Robyn Haber, MD  Brief History/Interval Summary: 61 year old Caucasian male with a past medical history of anorectal cancer on Xeloda also receiving radiation treatment. This cancer was diagnosed in February. He presented with few days to one week history of diarrhea with 6-7 loose stools daily, along with a fever of 101.5 on the day of admission. Patient was also noted to be hypokalemic, hyponatremic. Hospitalized for further management. Stool studies were sent. C. difficile was negative. GI pathogen panel was positive for Yersinia.  Reason for Visit: Acute diarrhea. Hypokalemia and hyponatremia.  Consultants: Radiation oncologist, Dr. Lisbeth Renshaw. Medical oncologist Dr. Burr Medico.  Procedures: None  Antibiotics: Ciprofloxacin  Subjective/Interval History: Patient is slowly improving. Continues to have about 6 loose bowel movements every day. Denies any blood in the stools. No nausea, vomiting. Denies any abdominal pain. His daughter is at the bedside.  ROS: Denies any shortness of breath or chest pain.  Objective:  Vital Signs  Vitals:   12/04/16 2026 12/05/16 0000 12/05/16 0444 12/05/16 1108  BP: 132/76  113/68 109/67  Pulse: (!) 105  81 79  Resp: 20  20 20   Temp: 99.5 F (37.5 C) (!) 102.1 F (38.9 C) 99 F (37.2 C) 98.5 F (36.9 C)  TempSrc: Oral Oral Oral Oral  SpO2: 100%  100% 99%  Weight:      Height:        Intake/Output Summary (Last 24 hours) at 12/05/16 1132 Last data filed at 12/04/16 1842  Gross per 24 hour  Intake             1670 ml  Output                0 ml  Net             1670 ml   Filed Weights   12/03/16 1607  Weight: 62.1 kg (137 lb)    General appearance: alert, cooperative, appears stated age and no distress Resp: clear to auscultation bilaterally Cardio: regular rate and rhythm, S1, S2 normal, no murmur, click, rub or gallop GI: soft,  non-tender; bowel sounds normal; no masses,  no organomegaly Neurologic: Awake, alert. Oriented 3. No focal neurological deficits  Lab Results:  Data Reviewed: I have personally reviewed following labs and imaging studies  CBC:  Recent Labs Lab 12/03/16 1135 12/04/16 0606 12/05/16 0647  WBC 3.0* 2.2* 3.8*  NEUTROABS 2.5 1.8 3.2  HGB 10.4* 9.0* 8.4*  HCT 31.4* 27.1* 26.1*  MCV 80.5 81.4 80.3  PLT 272 260 093    Basic Metabolic Panel:  Recent Labs Lab 12/03/16 1135 12/04/16 0606 12/05/16 0647  NA 126* 127* 129*  K 2.5* 3.0* 4.0  CL 92* 97* 100*  CO2 27 24 25   GLUCOSE 141* 127* 115*  BUN 9 8 8   CREATININE 0.64 0.64 0.67  CALCIUM 7.7* 6.8* 7.1*  MG 1.4* 2.2 2.0    GFR: Estimated Creatinine Clearance: 85.2 mL/min (by C-G formula based on SCr of 0.67 mg/dL).  Liver Function Tests:  Recent Labs Lab 12/03/16 1135  AST 17  ALT 19  ALKPHOS 61  BILITOT 0.8  PROT 5.4*  ALBUMIN 2.6*    Coagulation Profile:  Recent Labs Lab 12/03/16 1135  INR 1.22     Recent Results (from the past 240 hour(s))  Culture, blood (Routine x 2)     Status: None (Preliminary result)   Collection Time: 12/03/16 11:35 AM  Result Value Ref Range Status   Specimen Description BLOOD RIGHT ANTECUBITAL  Final   Special Requests   Final    BOTTLES DRAWN AEROBIC AND ANAEROBIC Blood Culture adequate volume   Culture   Final    NO GROWTH 2 DAYS Performed at [facility], [address] [place], [place] [address]    Report Status PENDING  Incomplete  Culture, blood (Routine x 2)     Status: None (Preliminary result)   Collection Time: 12/03/16 11:40 AM  Result Value Ref Range Status   Specimen Description BLOOD BLOOD LEFT FOREARM  Final   Special Requests   Final    BOTTLES DRAWN AEROBIC AND ANAEROBIC Blood Culture adequate volume   Culture   Final    NO GROWTH 2 DAYS Performed at [facility], [place] [address], [place], [place] [address]    Report Status PENDING   Incomplete  C difficile quick scan w PCR reflex     Status: None   Collection Time: 12/03/16  1:02 PM  Result Value Ref Range Status   C Diff antigen NEGATIVE NEGATIVE Final   C Diff toxin NEGATIVE NEGATIVE Final   C Diff interpretation No C. difficile detected.  Final  Gastrointestinal Panel by PCR , Stool     Status: Abnormal   Collection Time: 12/03/16  1:02 PM  Result Value Ref Range Status   Campylobacter species NOT DETECTED NOT DETECTED Final   Plesimonas shigelloides NOT DETECTED NOT DETECTED Final   Salmonella species NOT DETECTED NOT DETECTED Final   Yersinia enterocolitica DETECTED (A) NOT DETECTED Final    Comment: RESULT CALLED TO, READ BACK BY AND VERIFIED WITH: ABBY PINNIX 12/04/16 @ 1414  MLK    Vibrio species NOT DETECTED NOT DETECTED Final   Vibrio cholerae NOT DETECTED NOT DETECTED Final   Enteroaggregative E coli (EAEC) NOT DETECTED NOT DETECTED Final   Enteropathogenic E coli (EPEC) NOT DETECTED NOT DETECTED Final   Enterotoxigenic E coli (ETEC) NOT DETECTED NOT DETECTED Final   Shiga like toxin producing E coli (STEC) NOT DETECTED NOT DETECTED Final   Shigella/Enteroinvasive E coli (EIEC) NOT DETECTED NOT DETECTED Final   Cryptosporidium NOT DETECTED NOT DETECTED Final   Cyclospora cayetanensis NOT DETECTED NOT DETECTED Final   Entamoeba histolytica NOT DETECTED NOT DETECTED Final   Giardia lamblia NOT DETECTED NOT DETECTED Final   Adenovirus F40/41 NOT DETECTED NOT DETECTED Final   Astrovirus NOT DETECTED NOT DETECTED Final   Norovirus GI/GII NOT DETECTED NOT DETECTED Final   Rotavirus A NOT DETECTED NOT DETECTED Final   Sapovirus (I, II, IV, and V) NOT DETECTED NOT DETECTED Final      Radiology Studies: Dg Chest 2 View  Result Date: 12/03/2016 CLINICAL DATA:  Intermittent fever, current chemotherapy and radiation therapy for rectal tumor. EXAM: CHEST  2 VIEW COMPARISON:  None. FINDINGS: Heart size and mediastinal contours are normal. Lungs are  hyperexpanded. Lungs are clear. Nodular density projected over the left upper lung, measuring 10 mm, is compatible with a benign bone island demonstrated on CT of 10/13/2016. No pleural effusion or pneumothorax seen. No acute or suspicious osseous finding. IMPRESSION: 1. No active cardiopulmonary disease. No evidence of pneumonia or pulmonary edema. No evidence of intrathoracic metastasis. 2. Hyperexpanded lungs suggesting COPD. Electronically Signed   By: Franki Cabot M.D.   On: 12/03/2016 12:05     Medications:  Scheduled: . ciprofloxacin  500 mg Oral BID  . enoxaparin (LOVENOX) injection  40  mg Subcutaneous Q24H  . feeding supplement (ENSURE ENLIVE)  237 mL Oral BID BM  . multivitamin with minerals  1 tablet Oral Daily  . nystatin  5 mL Oral QID  . protein supplement shake  11 oz Oral BID BM  . sodium chloride flush  3 mL Intravenous Q12H   Continuous: . 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 This is the most likely explanation for his fever and diarrhea. Radiation could also be contributing. Patient started on ciprofloxacin. Await improvement. Imodium as needed.   Hyponatremia Most likely hypovolemic hyponatremia considering his GI symptoms. Sodium level has improved. Continue normal saline. Urine studies reviewed. Difficult to accurately interpret since these studies were done after patient was given IV fluids.    Hypokalemia/ hypomagnesemia/Prolonged Q-T interval on ECG  Potassium is now normal. CBC is normal. QT interval is normal, normal EKG. Continue to  monitor.   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 Patient seen by his oncologist. He is undergoing radiation as well.  Oral Thrush Nystatin  Iron deficiency anemia due to chronic blood loss/leukopenia Hemoglobin is lower today. No evidence for overt bleeding. This is mostly due to dilution. Continue to monitor closely. Noted to be leukopenic as well which is probably due to Xeloda. WBC/neutrophil count has improved this morning.   DVT Prophylaxis: On Lovenox Code Status: Full code  Family Communication: Discussed with the patient and his daughter Disposition Plan: Management as outlined above. Mobilize as tolerated.    LOS: 2 days   [facility] Pager [phone] 12/05/2016, 11:32 AM  If 7PM-7AM, please contact night-coverage at [email or web address], password [email or web address]

## 2016-12-06 ENCOUNTER — Encounter (HOSPITAL_COMMUNITY): Payer: Self-pay

## 2016-12-06 ENCOUNTER — Ambulatory Visit
Admission: RE | Admit: 2016-12-06 | Discharge: 2016-12-06 | Disposition: A | Discharge status: Home / Self Care | Payer: BLUE CROSS/BLUE SHIELD | Source: Ambulatory Visit | Attending: Radiation Oncology | Admitting: Radiation Oncology

## 2016-12-06 ENCOUNTER — Ambulatory Visit: Payer: BLUE CROSS/BLUE SHIELD

## 2016-12-06 DIAGNOSIS — E860 Dehydration: Secondary | ICD-10-CM

## 2016-12-06 DIAGNOSIS — R197 Diarrhea, unspecified: Secondary | ICD-10-CM

## 2016-12-06 DIAGNOSIS — D708 Other neutropenia: Secondary | ICD-10-CM

## 2016-12-06 LAB — CBC WITH DIFFERENTIAL/PLATELET
BASOS PCT: 0 %
Basophils Absolute: 0 10*3/uL (ref 0.0–0.1)
EOS PCT: 0 %
Eosinophils Absolute: 0 10*3/uL (ref 0.0–0.7)
HEMATOCRIT: 26.8 % — AB (ref 39.0–52.0)
HEMOGLOBIN: 8.6 g/dL — AB (ref 13.0–17.0)
LYMPHS ABS: 0.3 10*3/uL — AB (ref 0.7–4.0)
Lymphocytes Relative: 8 %
MCH: 25.8 pg — AB (ref 26.0–34.0)
MCHC: 32.1 g/dL (ref 30.0–36.0)
MCV: 80.5 fL (ref 78.0–100.0)
MONO ABS: 0.4 10*3/uL (ref 0.1–1.0)
Monocytes Relative: 12 %
NEUTROS ABS: 2.5 10*3/uL (ref 1.7–7.7)
Neutrophils Relative %: 80 %
Platelets: 302 10*3/uL (ref 150–400)
RBC: 3.33 MIL/uL — ABNORMAL LOW (ref 4.22–5.81)
RDW: 28.9 % — ABNORMAL HIGH (ref 11.5–15.5)
WBC: 3.2 10*3/uL — AB (ref 4.0–10.5)

## 2016-12-06 LAB — BASIC METABOLIC PANEL
Anion gap: 4 — ABNORMAL LOW (ref 5–15)
BUN: 11 mg/dL (ref 6–20)
CHLORIDE: 101 mmol/L (ref 101–111)
CO2: 25 mmol/L (ref 22–32)
CREATININE: 0.55 mg/dL — AB (ref 0.61–1.24)
Calcium: 7.4 mg/dL — ABNORMAL LOW (ref 8.9–10.3)
GFR calc Af Amer: >60 mL/min (ref >60)
GFR calc non Af Amer: >60 mL/min (ref >60)
Glucose, Bld: 135 mg/dL — ABNORMAL HIGH (ref 65–99)
POTASSIUM: 3.6 mmol/L (ref 3.5–5.1)
Sodium: 130 mmol/L — ABNORMAL LOW (ref 135–145)

## 2016-12-06 LAB — MAGNESIUM: Magnesium: 1.9 mg/dL (ref 1.7–2.4)

## 2016-12-06 MED ORDER — MAGNESIUM SULFATE 2 GM/50ML IV SOLN
2.0000 g | Freq: Once | INTRAVENOUS | Status: AC
  Administered 2016-12-06: 2 g via INTRAVENOUS
  Filled 2016-12-06: qty 50

## 2016-12-06 NOTE — Care Management Note (Signed)
Case Management Note  Patient Details  Name: David Clarke MRN: [id number] Date of Birth: [birth year]  Subjective/Objective:     Sepsis with hyponatremia,hypotension,nuetropenia               Action/Plan:Date:  December 06, 2016 Chart reviewed for concurrent status and case management needs. Will continue to follow patient progress. Discharge Planning: following for needs Expected discharge date: [id number] Velva Harman, BSN, [place], [place]   Expected Discharge Date:                  Expected Discharge Plan:  Home/Self Care  In-House Referral:     Discharge planning Services     Post Acute Care Choice:    Choice offered to:     DME Arranged:    DME Agency:     HH Arranged:    [place] Agency:     Status of Service:  In process, will continue to follow  If discussed at Long Length of Stay Meetings, dates discussed:    Additional Comments:  Leeroy Cha, RN 12/06/2016, 9:52 AM

## 2016-12-06 NOTE — Progress Notes (Signed)
[facility] Therapy Treatment Record Phone [phone]   Radiation Therapy was administered to [name] on: 12/06/2016  3:08 PM and was treatment # 21 out of a planned course of 25 treatments.  Radiation Treatment  1). Beam photons with 6-10 energy  2). Brachytherapy None  3). Stereotactic Radiosurgery None  4). Other Radiation None     [place] F Elloise Roark, RT (T)

## 2016-12-06 NOTE — Progress Notes (Addendum)
PROGRESS NOTE  David Clarke [id number]:[id number] DOB: 02/10/1956 DOA: 12/03/2016 PCP: Robyn Haber, MD  Brief History:  61 year old Caucasian male with a past medical history of anorectal cancer on Xeloda also receiving radiation treatment. This cancer was diagnosed in February. He presented with few days to one week history of diarrhea with 6-7 loose stools daily, along with a fever of 101.5 on the day of admission. Patient was also noted to be hypokalemic, hyponatremic. Hospitalized for further management. Stool studies were sent. C. difficile was negative. GI pathogen panel was positive for Yersinia.   Assessment/Plan: Yersinia Enterocolitis -manifesting as diarrhea -GI Pathogen panel positive for yersinia.  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 Diff negative -continue cipro-->plan 14 days -imodium prn  Hyponatremia -secondary to volume depletion and poor solute intake -hypovolemic hyponatremia considering his GI symptoms.  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 CBC is normal. QT interval is normal, normal EKG. Continue to monitor.   Rectal adenocarcinoma/Radiation dermatitis -Xeloda is on hold. Patient seen by his oncologist. He is undergoing radiation as well.   Iron deficiency anemia due to chronic blood loss/leukopenia -drop in Hgb partly due to dilution -leukopenia and anemia also attributable to Xeloda   Disposition Plan:   Home 4/12 if stable Family Communication:  No Family at bedside  Consultants:  MedOnc, Rad Onc  Code Status:  FULL   DVT Prophylaxis:  [facility] Lovenox   Procedures: As Listed in Progress Note Above  Antibiotics: None    Subjective: Patient denies fevers, chills, headache, chest  pain, dyspnea, nausea, vomiting, abdominal pain, dysuria, hematuria, hematochezia, and melena.  He is still having loose stool although the volume and consistency are improving.   Objective: Vitals:   12/05/16 1825 12/05/16 2028 12/06/16 0546 12/06/16 1336  BP: 122/68 103/62 110/69 113/67  Pulse: 72 81 86 87  Resp: 18 18 16 18   Temp: 99.4 F (37.4 C) 99.1 F (37.3 C) 98.8 F (37.1 C) 98 F (36.7 C)  TempSrc: Oral Oral Oral Oral  SpO2: 100% 99% 99% 99%  Weight:      Height:        Intake/Output Summary (Last 24 hours) at 12/06/16 1742 Last data filed at 12/06/16 1600  Gross per 24 hour  Intake          4301.25 ml  Output                0 ml  Net          4301.25 ml   Weight change:  Exam:   General:  Pt is alert, follows commands appropriately, not in acute distress  HEENT: No icterus, No thrush, No neck mass, [place]/AT  Cardiovascular: RRR, S1/S2, no rubs, no gallops  Respiratory: CTA bilaterally, no wheezing, no crackles, no rhonchi  Abdomen: Soft/+BS, non tender, non distended, no guarding  Extremities: No edema, No lymphangitis, No petechiae, No rashes, no synovitis   Data Reviewed: I have personally reviewed following labs and imaging studies Basic Metabolic Panel:  Recent Labs Lab 12/03/16 1135 12/04/16 0606 12/05/16 0647 12/06/16 0619  NA 126* 127* 129* 130*  K 2.5* 3.0* 4.0 3.6  CL 92* 97* 100* 101  CO2 27 24 25 25   GLUCOSE 141*  127* 115* 135*  BUN 9 8 8 11   CREATININE 0.64 0.64 0.67 0.55*  CALCIUM 7.7* 6.8* 7.1* 7.4*  MG 1.4* 2.2 2.0 1.9   Liver Function Tests:  Recent Labs Lab 12/03/16 1135  AST 17  ALT 19  ALKPHOS 61  BILITOT 0.8  PROT 5.4*  ALBUMIN 2.6*   No results for input(s): LIPASE, AMYLASE in the last 168 hours. No results for input(s): AMMONIA in the last 168 hours. Coagulation Profile:  Recent Labs Lab 12/03/16 1135  INR 1.22   CBC:  Recent Labs Lab 12/03/16 1135 12/04/16 0606 12/05/16 0647 12/06/16 0619  WBC  3.0* 2.2* 3.8* 3.2*  NEUTROABS 2.5 1.8 3.2 2.5  HGB 10.4* 9.0* 8.4* 8.6*  HCT 31.4* 27.1* 26.1* 26.8*  MCV 80.5 81.4 80.3 80.5  PLT 272 260 273 302   Cardiac Enzymes: No results for input(s): CKTOTAL, CKMB, CKMBINDEX, TROPONINI in the last 168 hours. BNP: Invalid input(s): POCBNP CBG: No results for input(s): GLUCAP in the last 168 hours. HbA1C: No results for input(s): HGBA1C in the last 72 hours. Urine analysis:    Component Value Date/Time   COLORURINE YELLOW 12/03/2016 1102   APPEARANCEUR HAZY (A) 12/03/2016 1102   LABSPEC 1.020 12/03/2016 1102   PHURINE 5.0 12/03/2016 1102   GLUCOSEU NEGATIVE 12/03/2016 1102   HGBUR NEGATIVE 12/03/2016 1102   BILIRUBINUR NEGATIVE 12/03/2016 1102   KETONESUR 5 (A) 12/03/2016 1102   PROTEINUR 30 (A) 12/03/2016 1102   NITRITE NEGATIVE 12/03/2016 1102   LEUKOCYTESUR NEGATIVE 12/03/2016 1102   Sepsis Labs: @LABRCNTIP (procalcitonin:4,lacticidven:4) ) Recent Results (from the past 240 hour(s))  Culture, blood (Routine x 2)     Status: None (Preliminary result)   Collection Time: 12/03/16 11:35 AM  Result Value Ref Range Status   Specimen Description BLOOD RIGHT ANTECUBITAL  Final   Special Requests   Final    BOTTLES DRAWN AEROBIC AND ANAEROBIC Blood Culture adequate volume   Culture   Final    NO GROWTH 3 DAYS Performed at [facility], [place] [address] [place], [place] [address]    Report Status PENDING  Incomplete  Culture, blood (Routine x 2)     Status: None (Preliminary result)   Collection Time: 12/03/16 11:40 AM  Result Value Ref Range Status   Specimen Description BLOOD BLOOD LEFT FOREARM  Final   Special Requests   Final    BOTTLES DRAWN AEROBIC AND ANAEROBIC Blood Culture adequate volume   Culture   Final    NO GROWTH 3 DAYS Performed at [facility], [place] [address], [place],  [address]    Report Status PENDING  Incomplete  C difficile quick scan w PCR reflex     Status: None   Collection Time:  12/03/16  1:02 PM  Result Value Ref Range Status   C Diff antigen NEGATIVE NEGATIVE Final   C Diff toxin NEGATIVE NEGATIVE Final   C Diff interpretation No C. difficile detected.  Final  Gastrointestinal Panel by PCR , Stool     Status: Abnormal   Collection Time: 12/03/16  1:02 PM  Result Value Ref Range Status   Campylobacter species NOT DETECTED NOT DETECTED Final   Plesimonas shigelloides NOT DETECTED NOT DETECTED Final   Salmonella species NOT DETECTED NOT DETECTED Final   Yersinia enterocolitica DETECTED (A) NOT DETECTED Final    Comment: RESULT CALLED TO, READ BACK BY AND VERIFIED WITH: ABBY PINNIX 12/04/16 @ 1414  MLK    Vibrio species NOT DETECTED  NOT DETECTED Final   Vibrio cholerae NOT DETECTED NOT DETECTED Final   Enteroaggregative E coli (EAEC) NOT DETECTED NOT DETECTED Final   Enteropathogenic E coli (EPEC) NOT DETECTED NOT DETECTED Final   Enterotoxigenic E coli (ETEC) NOT DETECTED NOT DETECTED Final   Shiga like toxin producing E coli (STEC) NOT DETECTED NOT DETECTED Final   Shigella/Enteroinvasive E coli (EIEC) NOT DETECTED NOT DETECTED Final   Cryptosporidium NOT DETECTED NOT DETECTED Final   Cyclospora cayetanensis NOT DETECTED NOT DETECTED Final   Entamoeba histolytica NOT DETECTED NOT DETECTED Final   Giardia lamblia NOT DETECTED NOT DETECTED Final   Adenovirus F40/41 NOT DETECTED NOT DETECTED Final   Astrovirus NOT DETECTED NOT DETECTED Final   Norovirus GI/GII NOT DETECTED NOT DETECTED Final   Rotavirus A NOT DETECTED NOT DETECTED Final   Sapovirus (I, II, IV, and V) NOT DETECTED NOT DETECTED Final     Scheduled Meds: . ciprofloxacin  500 mg Oral BID  . enoxaparin (LOVENOX) injection  40 mg Subcutaneous Q24H  . feeding supplement (ENSURE ENLIVE)  237 mL Oral BID BM  . multivitamin with minerals  1 tablet Oral Daily  . protein supplement shake  11 oz Oral BID BM  . sodium chloride flush  3 mL Intravenous Q12H   Continuous Infusions: . 0.9 % NaCl with KCl  40 mEq / L 75 mL/hr (12/06/16 0547)    Procedures/Studies: Dg Chest 2 View  Result Date: 12/03/2016 CLINICAL DATA:  Intermittent fever, current chemotherapy and radiation therapy for rectal tumor. EXAM: CHEST  2 VIEW COMPARISON:  None. FINDINGS: Heart size and mediastinal contours are normal. Lungs are hyperexpanded. Lungs are clear. Nodular density projected over the left upper lung, measuring 10 mm, is compatible with a benign bone island demonstrated on CT of 10/13/2016. No pleural effusion or pneumothorax seen. No acute or suspicious osseous finding. IMPRESSION: 1. No active cardiopulmonary disease. No evidence of pneumonia or pulmonary edema. No evidence of intrathoracic metastasis. 2. Hyperexpanded lungs suggesting COPD. Electronically Signed   By: Franki Cabot M.D.   On: 12/03/2016 12:05    Joeanna Howdyshell, DO  [facility] Pager [phone]  If 7PM-7AM, please contact night-coverage [email or web address] Password [email or web address] 12/06/2016, 5:42 PM   LOS: 3 days

## 2016-12-07 ENCOUNTER — Encounter (HOSPITAL_COMMUNITY): Payer: Self-pay | Admitting: Radiology

## 2016-12-07 ENCOUNTER — Inpatient Hospital Stay (HOSPITAL_COMMUNITY): Payer: BLUE CROSS/BLUE SHIELD

## 2016-12-07 ENCOUNTER — Ambulatory Visit
Admission: RE | Admit: 2016-12-07 | Discharge: 2016-12-07 | Disposition: A | Discharge status: Home / Self Care | Payer: BLUE CROSS/BLUE SHIELD | Source: Ambulatory Visit | Attending: Radiation Oncology | Admitting: Radiation Oncology

## 2016-12-07 ENCOUNTER — Ambulatory Visit: Payer: BLUE CROSS/BLUE SHIELD

## 2016-12-07 DIAGNOSIS — A09 Infectious gastroenteritis and colitis, unspecified: Secondary | ICD-10-CM

## 2016-12-07 DIAGNOSIS — R509 Fever, unspecified: Secondary | ICD-10-CM

## 2016-12-07 DIAGNOSIS — D500 Iron deficiency anemia secondary to blood loss (chronic): Secondary | ICD-10-CM

## 2016-12-07 LAB — BASIC METABOLIC PANEL
Anion gap: 7 (ref 5–15)
BUN: 8 mg/dL (ref 6–20)
CHLORIDE: 101 mmol/L (ref 101–111)
CO2: 21 mmol/L — AB (ref 22–32)
CREATININE: 0.61 mg/dL (ref 0.61–1.24)
Calcium: 7 mg/dL — ABNORMAL LOW (ref 8.9–10.3)
GFR calc non Af Amer: >60 mL/min (ref >60)
Glucose, Bld: 113 mg/dL — ABNORMAL HIGH (ref 65–99)
Potassium: 3 mmol/L — ABNORMAL LOW (ref 3.5–5.1)
Sodium: 129 mmol/L — ABNORMAL LOW (ref 135–145)

## 2016-12-07 LAB — CBC WITH DIFFERENTIAL/PLATELET
BASOS PCT: 0 %
Basophils Absolute: 0 10*3/uL (ref 0.0–0.1)
EOS PCT: 0 %
Eosinophils Absolute: 0 10*3/uL (ref 0.0–0.7)
HEMATOCRIT: 25.3 % — AB (ref 39.0–52.0)
HEMOGLOBIN: 8.4 g/dL — AB (ref 13.0–17.0)
LYMPHS ABS: 0.2 10*3/uL — AB (ref 0.7–4.0)
Lymphocytes Relative: 6 %
MCH: 27.2 pg (ref 26.0–34.0)
MCHC: 33.2 g/dL (ref 30.0–36.0)
MCV: 81.9 fL (ref 78.0–100.0)
MONO ABS: 0.5 10*3/uL (ref 0.1–1.0)
Monocytes Relative: 14 %
Neutro Abs: 2.9 10*3/uL (ref 1.7–7.7)
Neutrophils Relative %: 80 %
Platelets: 332 10*3/uL (ref 150–400)
RBC: 3.09 MIL/uL — ABNORMAL LOW (ref 4.22–5.81)
RDW: 29.1 % — ABNORMAL HIGH (ref 11.5–15.5)
WBC: 3.6 10*3/uL — ABNORMAL LOW (ref 4.0–10.5)

## 2016-12-07 LAB — URINALYSIS, ROUTINE W REFLEX MICROSCOPIC
Bilirubin Urine: NEGATIVE
GLUCOSE, UA: NEGATIVE mg/dL
Ketones, ur: NEGATIVE mg/dL
Leukocytes, UA: NEGATIVE
NITRITE: NEGATIVE
PH: 5 (ref 5.0–8.0)
Protein, ur: 30 mg/dL — AB
SPECIFIC GRAVITY, URINE: 1.02 (ref 1.005–1.030)

## 2016-12-07 LAB — CREATININE, URINE, RANDOM: CREATININE, URINE: 145.28 mg/dL

## 2016-12-07 LAB — SODIUM, URINE, RANDOM: Sodium, Ur: 122 mmol/L

## 2016-12-07 LAB — OSMOLALITY, URINE: Osmolality, Ur: 746 mOsm/kg (ref 300–900)

## 2016-12-07 LAB — OSMOLALITY: OSMOLALITY: 271 mosm/kg — AB (ref 275–295)

## 2016-12-07 MED ORDER — IOPAMIDOL (ISOVUE-300) INJECTION 61%
30.0000 mL | Freq: Once | INTRAVENOUS | Status: AC
  Administered 2016-12-07: 30 mL via ORAL

## 2016-12-07 MED ORDER — IOPAMIDOL (ISOVUE-300) INJECTION 61%
INTRAVENOUS | Status: AC
  Administered 2016-12-07: 100 mL
  Filled 2016-12-07: qty 100

## 2016-12-07 NOTE — Progress Notes (Signed)
David Clarke   DOB:April 17, 1956   IH#:[id number]   OVF#:[id number]  ONCOLOGY FOLLOW UP NOTE   Subjective: David Clarke overall feels better today, appetite improved, and eating better. But he spiked fever with T 102.2 this morning, still has watery diarrhea, 7-8 times a day, no hematochezia, no significant nausea, radiation has been continued this week.  Objective:  Vitals:   12/07/16 0615 12/07/16 1352  BP:  122/76  Pulse:  74  Resp:  18  Temp: 99.9 F (37.7 C) 98.4 F (36.9 C)    Body mass index is 18.58 kg/m.  Intake/Output Summary (Last 24 hours) at 12/07/16 1936 Last data filed at 12/07/16 1500  Gross per 24 hour  Intake          2783.34 ml  Output                0 ml  Net          2783.34 ml     Sclerae unicteric  Oropharynx clear  No peripheral adenopathy  Lungs clear -- no rales or rhonchi  Heart regular rate and rhythm  Abdomen benign, perianal area (+) diffuse skin erythema, and a few small ulcers, no significant discharge. His rectal mass is visible at the anal verge, smaller than before. (+) Severe testicular edema.  MSK no focal spinal tenderness, no peripheral edema  Neuro nonfocal   CBG (last 3)  No results for input(s): GLUCAP in the last 72 hours.   Labs:  Lab Results  Component Value Date   WBC 3.6 (L) 12/07/2016   HGB 8.4 (L) 12/07/2016   HCT 25.3 (L) 12/07/2016   MCV 81.9 12/07/2016   PLT 332 12/07/2016   NEUTROABS 2.9 12/07/2016   CMP Latest Ref Rng & Units 12/07/2016 12/06/2016 12/05/2016  Glucose 65 - 99 mg/dL 113(H) 135(H) 115(H)  BUN 6 - 20 mg/dL 8 11 8   Creatinine 0.61 - 1.24 mg/dL 0.61 0.55(L) 0.67  Sodium 135 - 145 mmol/L 129(L) 130(L) 129(L)  Potassium 3.5 - 5.1 mmol/L 3.0(L) 3.6 4.0  Chloride 101 - 111 mmol/L 101 101 100(L)  CO2 22 - 32 mmol/L 21(L) 25 25  Calcium 8.9 - 10.3 mg/dL 7.0(L) 7.4(L) 7.1(L)  Total Protein 6.5 - 8.1 g/dL - - -  Total Bilirubin 0.3 - 1.2 mg/dL - - -  Alkaline Phos 38 - 126 U/L - - -  AST 15 - 41 U/L - - -  ALT 17 -  63 U/L - - -     Urine Studies No results for input(s): UHGB, CRYS in the last 72 hours.  Invalid input(s): UACOL, UAPR, USPG, UPH, UTP, UGL, UKET, UBIL, UNIT, UROB, [place], UEPI, UWBC, Junie Panning [place], [place], [place]  Basic Metabolic Panel:  Recent Labs Lab 12/03/16 1135 12/04/16 0606 12/05/16 0647 12/06/16 0619 12/07/16 0636  NA 126* 127* 129* 130* 129*  K 2.5* 3.0* 4.0 3.6 3.0*  CL 92* 97* 100* 101 101  CO2 27 24 25 25  21*  GLUCOSE 141* 127* 115* 135* 113*  BUN 9 8 8 11 8   CREATININE 0.64 0.64 0.67 0.55* 0.61  CALCIUM 7.7* 6.8* 7.1* 7.4* 7.0*  MG 1.4* 2.2 2.0 1.9  --    GFR Estimated Creatinine Clearance: 85.2 mL/min (by C-G formula based on SCr of 0.61 mg/dL). Liver Function Tests:  Recent Labs Lab 12/03/16 1135  AST 17  ALT 19  ALKPHOS 61  BILITOT 0.8  PROT 5.4*  ALBUMIN 2.6*   No results for input(s): LIPASE, AMYLASE in  the last 168 hours. No results for input(s): AMMONIA in the last 168 hours. Coagulation profile  Recent Labs Lab 12/03/16 1135  INR 1.22    CBC:  Recent Labs Lab 12/03/16 1135 12/04/16 0606 12/05/16 0647 12/06/16 0619 12/07/16 0636  WBC 3.0* 2.2* 3.8* 3.2* 3.6*  NEUTROABS 2.5 1.8 3.2 2.5 2.9  HGB 10.4* 9.0* 8.4* 8.6* 8.4*  HCT 31.4* 27.1* 26.1* 26.8* 25.3*  MCV 80.5 81.4 80.3 80.5 81.9  PLT 272 260 273 302 332   Cardiac Enzymes: No results for input(s): CKTOTAL, CKMB, CKMBINDEX, TROPONINI in the last 168 hours. BNP: Invalid input(s): POCBNP CBG: No results for input(s): GLUCAP in the last 168 hours. D-Dimer No results for input(s): DDIMER in the last 72 hours. Hgb A1c No results for input(s): HGBA1C in the last 72 hours. Lipid Profile No results for input(s): CHOL, HDL, LDLCALC, TRIG, CHOLHDL, LDLDIRECT in the last 72 hours. Thyroid function studies No results for input(s): TSH, T4TOTAL, T3FREE, THYROIDAB in the last 72 hours.  Invalid input(s): FREET3 Anemia work up No results for input(s): VITAMINB12, FOLATE,  FERRITIN, TIBC, IRON, RETICCTPCT in the last 72 hours. Microbiology Recent Results (from the past 240 hour(s))  Culture, blood (Routine x 2)     Status: None (Preliminary result)   Collection Time: 12/03/16 11:35 AM  Result Value Ref Range Status   Specimen Description BLOOD RIGHT ANTECUBITAL  Final   Special Requests   Final    BOTTLES DRAWN AEROBIC AND ANAEROBIC Blood Culture adequate volume   Culture   Final    NO GROWTH 4 DAYS Performed at [facility], [address], [place], [place] [address]    Report Status PENDING  Incomplete  Culture, blood (Routine x 2)     Status: None (Preliminary result)   Collection Time: 12/03/16 11:40 AM  Result Value Ref Range Status   Specimen Description BLOOD BLOOD LEFT FOREARM  Final   Special Requests   Final    BOTTLES DRAWN AEROBIC AND ANAEROBIC Blood Culture adequate volume   Culture   Final    NO GROWTH 4 DAYS Performed at [facility], [place] [address] [place], [place] [address]    Report Status PENDING  Incomplete  C difficile quick scan w PCR reflex     Status: None   Collection Time: 12/03/16  1:02 PM  Result Value Ref Range Status   C Diff antigen NEGATIVE NEGATIVE Final   C Diff toxin NEGATIVE NEGATIVE Final   C Diff interpretation No C. difficile detected.  Final  Gastrointestinal Panel by PCR , Stool     Status: Abnormal   Collection Time: 12/03/16  1:02 PM  Result Value Ref Range Status   Campylobacter species NOT DETECTED NOT DETECTED Final   Plesimonas shigelloides NOT DETECTED NOT DETECTED Final   Salmonella species NOT DETECTED NOT DETECTED Final   Yersinia enterocolitica DETECTED (A) NOT DETECTED Final    Comment: RESULT CALLED TO, READ BACK BY AND VERIFIED WITH: ABBY PINNIX 12/04/16 @ 1414  MLK    Vibrio species NOT DETECTED NOT DETECTED Final   Vibrio cholerae NOT DETECTED NOT DETECTED Final   Enteroaggregative E coli (EAEC) NOT DETECTED NOT DETECTED Final   Enteropathogenic E coli (EPEC) NOT DETECTED  NOT DETECTED Final   Enterotoxigenic E coli (ETEC) NOT DETECTED NOT DETECTED Final   Shiga like toxin producing E coli (STEC) NOT DETECTED NOT DETECTED Final   Shigella/Enteroinvasive E coli (EIEC) NOT DETECTED NOT DETECTED Final  Cryptosporidium NOT DETECTED NOT DETECTED Final   Cyclospora cayetanensis NOT DETECTED NOT DETECTED Final   Entamoeba histolytica NOT DETECTED NOT DETECTED Final   Giardia lamblia NOT DETECTED NOT DETECTED Final   Adenovirus F40/41 NOT DETECTED NOT DETECTED Final   Astrovirus NOT DETECTED NOT DETECTED Final   Norovirus GI/GII NOT DETECTED NOT DETECTED Final   Rotavirus A NOT DETECTED NOT DETECTED Final   Sapovirus (I, II, IV, and V) NOT DETECTED NOT DETECTED Final      Studies:  No results found.  Assessment: 61 y.o. with rectal cancer, on concurrent chemotherapy and radiation, admitted for fever and diarrhea.  1. Fever and diarrhea, (+) Yersinia enterocolitica in stool  2. leutropenia, not neutropenic  3. Anemia of iron deficiency, and secondary to chemo 4. Hyponatremia and hypokalemia 5. Stage III rectal cancer, on chemoRT  6. Moderate calorie and protein malnutrition  Plan:  -he is cipro for yerisinia colitis, overall improved -episode of fever this morning, cultures pending, possible related to radiation related inflammation, less likely tumor fever -continue supportive care -will continue holding Xeloda until I see him back mid-next week -Hope he will be good enough to go home this weekend. I plan to see him after discharge.   Truitt Merle, MD 12/07/2016  7:36 PM

## 2016-12-07 NOTE — Progress Notes (Signed)
PROGRESS NOTE  David Clarke JAS:[id number] DOB: 1956/05/07 DOA: 12/03/2016 PCP: Robyn Haber, MD  Brief History:  61 year old Caucasian male with a past medical history of anorectal cancer on Xeloda also receiving radiation treatment. This cancer was diagnosed in February. He presented with few days to one week history of diarrhea with 6-7 loose stools daily, along with a fever of 101.5 on the day of admission. Patient was also noted to be hypokalemic, hyponatremic. Hospitalized for further management. Stool studies were sent. C. difficile was negative. GI pathogen panel was positive for Yersinia.   Assessment/Plan: Yersinia Enterocolitis -manifesting as diarrhea -GI Pathogen panel positive for yersinia.  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 Diff negative -continue cipro-->plan 14 days -imodium prn-->encouraged pt to use more regularly  Persistent fever -suspect neoplastic/tumor fever -concerned about superimposed cellulitis over radiation dermatitis -CT abd/pelvis -blood cultures neg -UA/urine culture -CXR neg -Flu--neg  Hyponatremia -secondary to volume depletion and poor solute intake -hypovolemic hyponatremia considering his GI symptoms.  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 CBC is normal. QT interval is normal, normal EKG.  -check mag  Rectal adenocarcinoma/Radiation dermatitis -Xeloda is on hold. Patient seen by his oncologist. He is undergoing radiation as well.   Iron deficiency anemia due to chronic blood loss/leukopenia -drop in Hgb partly due to dilution -leukopenia and anemia also attributable to Xeloda   Disposition Plan:   Home 4/13 or 4/14 if stable Family Communication:  No Family at bedside--Total time  spent 35 minutes.  Greater than 50% spent face to face counseling and coordinating care.   Consultants:  MedOnc, Rad Onc  Code Status:  FULL   DVT Prophylaxis:  [place] Lovenox   Procedures: As Listed in Progress Note Above  Antibiotics: cipro 4/9>>>    Subjective: Patient denies fevers, chills, headache, chest pain, dyspnea, nausea, vomiting, abdominal pain, dysuria, hematuria, hematochezia, and melena. The patient complains of pain in his rectal and perirectal area. He has some abdominal discomfort.   Objective: Vitals:   12/06/16 2221 12/07/16 0029 12/07/16 0516 12/07/16 0615  BP:   116/71   Pulse:   96   Resp:   18   Temp: 99 F (37.2 C) 98.9 F (37.2 C) (!) 100.9 F (38.3 C) 99.9 F (37.7 C)  TempSrc: Oral Oral Oral Oral  SpO2:   98%   Weight:      Height:        Intake/Output Summary (Last 24 hours) at 12/07/16 1247 Last data filed at 12/07/16 0300  Gross per 24 hour  Intake          2071.67 ml  Output                0 ml  Net          2071.67 ml   Weight change:  Exam:   General:  Pt is alert, follows commands appropriately, not in acute distress  HEENT: No icterus, No thrush, No neck mass, [place]/AT  Cardiovascular: RRR, S1/S2, no rubs, no gallops  Respiratory: CTA bilaterally, no wheezing, no crackles, no rhonchi  Abdomen: Soft/+BS, non tender, non distended, no guarding; rectal and perirectal area with skin excoriations and erythema without purulent drainage. No crepitance. No necrosis.  Extremities: No edema, No lymphangitis,  No petechiae, No rashes, no synovitis   Data Reviewed: I have personally reviewed following labs and imaging studies Basic Metabolic Panel:  Recent Labs Lab 12/03/16 1135 12/04/16 0606 12/05/16 0647 12/06/16 0619 12/07/16 0636  NA 126* 127* 129* 130* 129*  K 2.5* 3.0* 4.0 3.6 3.0*  CL 92* 97* 100* 101 101  CO2 27 24 25 25  21*  GLUCOSE 141* 127* 115* 135* 113*  BUN 9 8 8 11 8   CREATININE 0.64 0.64 0.67 0.55*  0.61  CALCIUM 7.7* 6.8* 7.1* 7.4* 7.0*  MG 1.4* 2.2 2.0 1.9  --    Liver Function Tests:  Recent Labs Lab 12/03/16 1135  AST 17  ALT 19  ALKPHOS 61  BILITOT 0.8  PROT 5.4*  ALBUMIN 2.6*   No results for input(s): LIPASE, AMYLASE in the last 168 hours. No results for input(s): AMMONIA in the last 168 hours. Coagulation Profile:  Recent Labs Lab 12/03/16 1135  INR 1.22   CBC:  Recent Labs Lab 12/03/16 1135 12/04/16 0606 12/05/16 0647 12/06/16 0619 12/07/16 0636  WBC 3.0* 2.2* 3.8* 3.2* 3.6*  NEUTROABS 2.5 1.8 3.2 2.5 2.9  HGB 10.4* 9.0* 8.4* 8.6* 8.4*  HCT 31.4* 27.1* 26.1* 26.8* 25.3*  MCV 80.5 81.4 80.3 80.5 81.9  PLT 272 260 273 302 332   Cardiac Enzymes: No results for input(s): CKTOTAL, CKMB, CKMBINDEX, TROPONINI in the last 168 hours. BNP: Invalid input(s): POCBNP CBG: No results for input(s): GLUCAP in the last 168 hours. HbA1C: No results for input(s): HGBA1C in the last 72 hours. Urine analysis:    Component Value Date/Time   COLORURINE YELLOW 12/03/2016 1102   APPEARANCEUR HAZY (A) 12/03/2016 1102   LABSPEC 1.020 12/03/2016 1102   PHURINE 5.0 12/03/2016 1102   GLUCOSEU NEGATIVE 12/03/2016 1102   HGBUR NEGATIVE 12/03/2016 1102   BILIRUBINUR NEGATIVE 12/03/2016 1102   KETONESUR 5 (A) 12/03/2016 1102   PROTEINUR 30 (A) 12/03/2016 1102   NITRITE NEGATIVE 12/03/2016 1102   LEUKOCYTESUR NEGATIVE 12/03/2016 1102   Sepsis Labs: @LABRCNTIP (procalcitonin:4,lacticidven:4) ) Recent Results (from the past 240 hour(s))  Culture, blood (Routine x 2)     Status: None (Preliminary result)   Collection Time: 12/03/16 11:35 AM  Result Value Ref Range Status   Specimen Description BLOOD RIGHT ANTECUBITAL  Final   Special Requests   Final    BOTTLES DRAWN AEROBIC AND ANAEROBIC Blood Culture adequate volume   Culture   Final    NO GROWTH 4 DAYS Performed at [facility], [place] [address], [place], [place] [address]    Report Status PENDING   Incomplete  Culture, blood (Routine x 2)     Status: None (Preliminary result)   Collection Time: 12/03/16 11:40 AM  Result Value Ref Range Status   Specimen Description BLOOD BLOOD LEFT FOREARM  Final   Special Requests   Final    BOTTLES DRAWN AEROBIC AND ANAEROBIC Blood Culture adequate volume   Culture   Final    NO GROWTH 4 DAYS Performed at [facility], [place] [address] [place], [place] [address]    Report Status PENDING  Incomplete  C difficile quick scan w PCR reflex     Status: None   Collection Time: 12/03/16  1:02 PM  Result Value Ref Range Status   C Diff antigen NEGATIVE NEGATIVE Final   C Diff toxin NEGATIVE NEGATIVE Final   C Diff interpretation No C. difficile detected.  Final  Gastrointestinal Panel by PCR , Stool  Status: Abnormal   Collection Time: 12/03/16  1:02 PM  Result Value Ref Range Status   Campylobacter species NOT DETECTED NOT DETECTED Final   Plesimonas shigelloides NOT DETECTED NOT DETECTED Final   Salmonella species NOT DETECTED NOT DETECTED Final   Yersinia enterocolitica DETECTED (A) NOT DETECTED Final    Comment: RESULT CALLED TO, READ BACK BY AND VERIFIED WITH: ABBY PINNIX 12/04/16 @ 1414  MLK    Vibrio species NOT DETECTED NOT DETECTED Final   Vibrio cholerae NOT DETECTED NOT DETECTED Final   Enteroaggregative E coli (EAEC) NOT DETECTED NOT DETECTED Final   Enteropathogenic E coli (EPEC) NOT DETECTED NOT DETECTED Final   Enterotoxigenic E coli (ETEC) NOT DETECTED NOT DETECTED Final   Shiga like toxin producing E coli (STEC) NOT DETECTED NOT DETECTED Final   Shigella/Enteroinvasive E coli (EIEC) NOT DETECTED NOT DETECTED Final   Cryptosporidium NOT DETECTED NOT DETECTED Final   Cyclospora cayetanensis NOT DETECTED NOT DETECTED Final   Entamoeba histolytica NOT DETECTED NOT DETECTED Final   Giardia lamblia NOT DETECTED NOT DETECTED Final   Adenovirus F40/41 NOT DETECTED NOT DETECTED Final   Astrovirus NOT DETECTED NOT DETECTED Final    Norovirus GI/GII NOT DETECTED NOT DETECTED Final   Rotavirus A NOT DETECTED NOT DETECTED Final   Sapovirus (I, II, IV, and V) NOT DETECTED NOT DETECTED Final     Scheduled Meds: . ciprofloxacin  500 mg Oral BID  . enoxaparin (LOVENOX) injection  40 mg Subcutaneous Q24H  . feeding supplement (ENSURE ENLIVE)  237 mL Oral BID BM  . multivitamin with minerals  1 tablet Oral Daily  . protein supplement shake  11 oz Oral BID BM  . sodium chloride flush  3 mL Intravenous Q12H   Continuous Infusions: . 0.9 % NaCl with KCl 40 mEq / L 125 mL/hr (12/06/16 2245)    Procedures/Studies: Dg Chest 2 View  Result Date: 12/03/2016 CLINICAL DATA:  Intermittent fever, current chemotherapy and radiation therapy for rectal tumor. EXAM: CHEST  2 VIEW COMPARISON:  None. FINDINGS: Heart size and mediastinal contours are normal. Lungs are hyperexpanded. Lungs are clear. Nodular density projected over the left upper lung, measuring 10 mm, is compatible with a benign bone island demonstrated on CT of 10/13/2016. No pleural effusion or pneumothorax seen. No acute or suspicious osseous finding. IMPRESSION: 1. No active cardiopulmonary disease. No evidence of pneumonia or pulmonary edema. No evidence of intrathoracic metastasis. 2. Hyperexpanded lungs suggesting COPD. Electronically Signed   By: Franki Cabot M.D.   On: 12/03/2016 12:05    Angy Swearengin, DO  [facility] Pager [phone]  If 7PM-7AM, please contact night-coverage [email or web address] Password [email or web address] 12/07/2016, 12:47 PM   LOS: 4 days

## 2016-12-07 NOTE — Progress Notes (Signed)
[facility] Therapy Treatment Record Phone [phone]   Radiation Therapy was administered to David Clarke on: 12/07/2016  3:41 PM and was treatment  22 out of a planned course of 30 treatments.  Radiation Treatment  1). Beam photons with 6-10 energy  2). Brachytherapy None  3). Stereotactic Radiosurgery None  4). Other Radiation None     Shearon Balo

## 2016-12-08 ENCOUNTER — Ambulatory Visit: Payer: BLUE CROSS/BLUE SHIELD

## 2016-12-08 ENCOUNTER — Ambulatory Visit: Payer: BLUE CROSS/BLUE SHIELD | Admitting: Nurse Practitioner

## 2016-12-08 ENCOUNTER — Ambulatory Visit
Admission: RE | Admit: 2016-12-08 | Discharge: 2016-12-08 | Disposition: A | Discharge status: Home / Self Care | Payer: BLUE CROSS/BLUE SHIELD | Source: Ambulatory Visit | Attending: Radiation Oncology | Admitting: Radiation Oncology

## 2016-12-08 LAB — BASIC METABOLIC PANEL
Anion gap: 6 (ref 5–15)
BUN: 6 mg/dL (ref 6–20)
CO2: 21 mmol/L — ABNORMAL LOW (ref 22–32)
Calcium: 7.3 mg/dL — ABNORMAL LOW (ref 8.9–10.3)
Chloride: 105 mmol/L (ref 101–111)
Creatinine, Ser: 0.56 mg/dL — ABNORMAL LOW (ref 0.61–1.24)
GFR calc Af Amer: >60 mL/min (ref >60)
Glucose, Bld: 108 mg/dL — ABNORMAL HIGH (ref 65–99)
Potassium: 3.5 mmol/L (ref 3.5–5.1)
SODIUM: 132 mmol/L — AB (ref 135–145)

## 2016-12-08 LAB — CULTURE, BLOOD (ROUTINE X 2)
CULTURE: NO GROWTH
Culture: NO GROWTH
SPECIAL REQUESTS: ADEQUATE
Special Requests: ADEQUATE

## 2016-12-08 LAB — CBC WITH DIFFERENTIAL/PLATELET
BASOS ABS: 0 10*3/uL (ref 0.0–0.1)
Basophils Relative: 0 %
EOS ABS: 0 10*3/uL (ref 0.0–0.7)
Eosinophils Relative: 0 %
HCT: 26.2 % — ABNORMAL LOW (ref 39.0–52.0)
Hemoglobin: 8.6 g/dL — ABNORMAL LOW (ref 13.0–17.0)
LYMPHS ABS: 0.5 10*3/uL — AB (ref 0.7–4.0)
Lymphocytes Relative: 10 %
MCH: 27.1 pg (ref 26.0–34.0)
MCHC: 32.8 g/dL (ref 30.0–36.0)
MCV: 82.6 fL (ref 78.0–100.0)
MONO ABS: 0.5 10*3/uL (ref 0.1–1.0)
Monocytes Relative: 11 %
NEUTROS ABS: 3.5 10*3/uL (ref 1.7–7.7)
Neutrophils Relative %: 79 %
PLATELETS: 356 10*3/uL (ref 150–400)
RBC: 3.17 MIL/uL — ABNORMAL LOW (ref 4.22–5.81)
RDW: 29.5 % — ABNORMAL HIGH (ref 11.5–15.5)
WBC: 4.5 10*3/uL (ref 4.0–10.5)

## 2016-12-08 NOTE — Progress Notes (Signed)
PROGRESS NOTE  David Clarke QVZ:[id number] DOB: April 16, 1956 DOA: 12/03/2016 PCP: Robyn Haber, MD  Brief History: 61 year old Caucasian male with a past medical history of anorectal cancer on Xeloda also receiving radiation treatment. This cancer was diagnosed in February. He presented with few days to one week history of diarrhea with 6-7 loose stools daily, along with a fever of 101.5 on the day of admission. Patient was also noted to be hypokalemic, hyponatremic. Hospitalized for further management. Stool studies were sent. C. difficile was negative. GI pathogen panel was positive for Yersinia.   Assessment/Plan: Yersinia Enterocolitis -manifesting as diarrhea -GI Pathogen panel positive for yersinia.  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 Diff negative -continue cipro-->plan 14 days  Ileus vs SBO -4/13 CT abd/pelvis--marked increase caliber proximal small bowel loops and multiple small bowel A/F levels concerning for SBO; mid and distal small bowel loops with diffuse abnormal thickening and enhancement up to the ileocecal bowel consistent with infection; large right inguinal hernia with thickened small bowel loops -clinically does not appear obstructed -d/c imodium -changes may be related to his infectious process -consult general surgery -defer diet restriction to surgery  Persistent fever -suspect neoplastic/tumor fever vs radiation related fever -??superimposed cellulitis over radiation dermatitis -CT abd/pelvis--ileus vs SBO -blood cultures neg -UA--no pyuria -CXR neg -Flu--neg  Hyponatremia -secondary to volume depletion and poor solute intake -hypovolemic hyponatremia considering his GI symptoms.  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 CBC is normal. QT interval is normal, normal EKG.  -check mag  Rectal adenocarcinoma/Radiation dermatitis -Xeloda is on hold. Patient seen by his oncologist. He is undergoing radiation as well.   Iron deficiency anemia due to chronic blood loss/leukopenia -drop in Hgb partly due to dilution -leukopenia and anemia also attributable to Xeloda   Disposition Plan: Home 4/13 or 4/14 if stable Family Communication: NoFamily at bedside--Total time spent 35 minutes.  Greater than 50% spent face to face counseling and coordinating care.   Consultants: MedOnc, Rad Onc  Code Status: FULL   DVT Prophylaxis: Western Lovenox   Procedures: As Listed in Progress Note Above  Antibiotics: cipro 4/9>>>    Subjective: Patient continues to have loose stool, but is having more substance to stool and decreasing volume. Denies any vomiting, abdominal pain, dysuria, hematuria. Denies a headache, fevers, chills, redness of breath. No coughing or hemoptysis.  Objective: Vitals:   12/07/16 0615 12/07/16 1352 12/07/16 2105 12/08/16 0620  BP:  122/76 126/70 121/75  Pulse:  74 77 77  Resp:  18 20 18   Temp: 99.9 F (37.7 C) 98.4 F (36.9 C) 99.7 F (37.6 C) 99 F (37.2 C)  TempSrc: Oral Oral Oral Oral  SpO2:  100% 100% 100%  Weight:      Height:        Intake/Output Summary (Last 24 hours) at 12/08/16 1144 Last data filed at 12/08/16 0300  Gross per 24 hour  Intake             2850 ml  Output                0 ml  Net             2850 ml   Weight change:  Exam:  General:  Pt is alert, follows commands appropriately, not in acute distress  HEENT: No icterus, No thrush, No neck mass, [place]/AT  Cardiovascular: RRR, S1/S2, no rubs, no gallops  Respiratory: CTA bilaterally, no wheezing, no crackles, no rhonchi  Abdomen: Soft/+BS, non tender, non distended, no guarding  Extremities: No edema, No lymphangitis, No petechiae, No rashes, no  synovitis; perirectal area with erythema and excoriations without purulent drainage or lymphangitis.   Data Reviewed: I have personally reviewed following labs and imaging studies Basic Metabolic Panel:  Recent Labs Lab 12/03/16 1135 12/04/16 0606 12/05/16 0647 12/06/16 0619 12/07/16 0636 12/08/16 0634  NA 126* 127* 129* 130* 129* 132*  K 2.5* 3.0* 4.0 3.6 3.0* 3.5  CL 92* 97* 100* 101 101 105  CO2 27 24 25 25  21* 21*  GLUCOSE 141* 127* 115* 135* 113* 108*  BUN 9 8 8 11 8 6   CREATININE 0.64 0.64 0.67 0.55* 0.61 0.56*  CALCIUM 7.7* 6.8* 7.1* 7.4* 7.0* 7.3*  MG 1.4* 2.2 2.0 1.9  --   --    Liver Function Tests:  Recent Labs Lab 12/03/16 1135  AST 17  ALT 19  ALKPHOS 61  BILITOT 0.8  PROT 5.4*  ALBUMIN 2.6*   No results for input(s): LIPASE, AMYLASE in the last 168 hours. No results for input(s): AMMONIA in the last 168 hours. Coagulation Profile:  Recent Labs Lab 12/03/16 1135  INR 1.22   CBC:  Recent Labs Lab 12/04/16 0606 12/05/16 0647 12/06/16 0619 12/07/16 0636 12/08/16 0634  WBC 2.2* 3.8* 3.2* 3.6* 4.5  NEUTROABS 1.8 3.2 2.5 2.9 3.5  HGB 9.0* 8.4* 8.6* 8.4* 8.6*  HCT 27.1* 26.1* 26.8* 25.3* 26.2*  MCV 81.4 80.3 80.5 81.9 82.6  PLT 260 273 302 332 356   Cardiac Enzymes: No results for input(s): CKTOTAL, CKMB, CKMBINDEX, TROPONINI in the last 168 hours. BNP: Invalid input(s): POCBNP CBG: No results for input(s): GLUCAP in the last 168 hours. HbA1C: No results for input(s): HGBA1C in the last 72 hours. Urine analysis:    Component Value Date/Time   COLORURINE YELLOW 12/07/2016 1934   APPEARANCEUR CLEAR 12/07/2016 1934   LABSPEC 1.020 12/07/2016 1934   PHURINE 5.0 12/07/2016 1934   GLUCOSEU NEGATIVE 12/07/2016 1934   HGBUR SMALL (A) 12/07/2016 1934   BILIRUBINUR NEGATIVE 12/07/2016 [place] NEGATIVE 12/07/2016 1934   PROTEINUR 30 (A) 12/07/2016 1934   NITRITE NEGATIVE 12/07/2016 1934   LEUKOCYTESUR NEGATIVE 12/07/2016 1934    Sepsis Labs: @LABRCNTIP (procalcitonin:4,lacticidven:4) ) Recent Results (from the past 240 hour(s))  Culture, blood (Routine x 2)     Status: None   Collection Time: 12/03/16 11:35 AM  Result Value Ref Range Status   Specimen Description BLOOD RIGHT ANTECUBITAL  Final   Special Requests   Final    BOTTLES DRAWN AEROBIC AND ANAEROBIC Blood Culture adequate volume   Culture   Final    NO GROWTH 5 DAYS Performed at [facility], [place] [address] [place], [place] [address]    Report Status 12/08/2016 FINAL  Final  Culture, blood (Routine x 2)     Status: None   Collection Time: 12/03/16 11:40 AM  Result Value Ref Range Status   Specimen Description BLOOD BLOOD LEFT FOREARM  Final   Special Requests   Final    BOTTLES DRAWN AEROBIC AND ANAEROBIC Blood Culture adequate volume   Culture   Final    NO GROWTH 5 DAYS Performed at [facility], [place]  [address], [place], [place] [address]    Report Status 12/08/2016 FINAL  Final  C difficile quick scan w PCR reflex     Status: None   Collection Time: 12/03/16  1:02 PM  Result Value Ref Range Status   C Diff antigen NEGATIVE NEGATIVE Final   C Diff toxin NEGATIVE NEGATIVE Final   C Diff interpretation No C. difficile detected.  Final  Gastrointestinal Panel by PCR , Stool     Status: Abnormal   Collection Time: 12/03/16  1:02 PM  Result Value Ref Range Status   Campylobacter species NOT DETECTED NOT DETECTED Final   Plesimonas shigelloides NOT DETECTED NOT DETECTED Final   Salmonella species NOT DETECTED NOT DETECTED Final   Yersinia enterocolitica DETECTED (A) NOT DETECTED Final    Comment: RESULT CALLED TO, READ BACK BY AND VERIFIED WITH: ABBY PINNIX 12/04/16 @ 1414  MLK    Vibrio species NOT DETECTED NOT DETECTED Final   Vibrio cholerae NOT DETECTED NOT DETECTED Final   Enteroaggregative E coli (EAEC) NOT DETECTED NOT DETECTED Final   Enteropathogenic E coli (EPEC) NOT DETECTED NOT DETECTED Final   Enterotoxigenic E  coli (ETEC) NOT DETECTED NOT DETECTED Final   Shiga like toxin producing E coli (STEC) NOT DETECTED NOT DETECTED Final   Shigella/Enteroinvasive E coli (EIEC) NOT DETECTED NOT DETECTED Final   Cryptosporidium NOT DETECTED NOT DETECTED Final   Cyclospora cayetanensis NOT DETECTED NOT DETECTED Final   Entamoeba histolytica NOT DETECTED NOT DETECTED Final   Giardia lamblia NOT DETECTED NOT DETECTED Final   Adenovirus F40/41 NOT DETECTED NOT DETECTED Final   Astrovirus NOT DETECTED NOT DETECTED Final   Norovirus GI/GII NOT DETECTED NOT DETECTED Final   Rotavirus A NOT DETECTED NOT DETECTED Final   Sapovirus (I, II, IV, and V) NOT DETECTED NOT DETECTED Final     Scheduled Meds: . ciprofloxacin  500 mg Oral BID  . enoxaparin (LOVENOX) injection  40 mg Subcutaneous Q24H  . multivitamin with minerals  1 tablet Oral Daily  . protein supplement shake  11 oz Oral BID BM  . sodium chloride flush  3 mL Intravenous Q12H   Continuous Infusions: . 0.9 % NaCl with KCl 40 mEq / L 125 mL/hr (12/08/16 0101)    Procedures/Studies: Dg Chest 2 View  Result Date: 12/03/2016 CLINICAL DATA:  Intermittent fever, current chemotherapy and radiation therapy for rectal tumor. EXAM: CHEST  2 VIEW COMPARISON:  None. FINDINGS: Heart size and mediastinal contours are normal. Lungs are hyperexpanded. Lungs are clear. Nodular density projected over the left upper lung, measuring 10 mm, is compatible with a benign bone island demonstrated on CT of 10/13/2016. No pleural effusion or pneumothorax seen. No acute or suspicious osseous finding. IMPRESSION: 1. No active cardiopulmonary disease. No evidence of pneumonia or pulmonary edema. No evidence of intrathoracic metastasis. 2. Hyperexpanded lungs suggesting COPD. Electronically Signed   By: Franki Cabot M.D.   On: 12/03/2016 12:05   Ct Abdomen Pelvis W Contrast  Result Date: 12/08/2016 CLINICAL DATA:  Scrotal pain and swelling from radiation. EXAM: CT ABDOMEN AND PELVIS  WITH CONTRAST TECHNIQUE: Multidetector CT imaging of the abdomen and pelvis was performed using the standard protocol following bolus administration of intravenous contrast. CONTRAST:  131mL ISOVUE-300 IOPAMIDOL (ISOVUE-300) INJECTION 61% COMPARISON:  10/13/2016. FINDINGS: Lower chest: Small right pleural effusion. Hepatobiliary: 11 mm hypodense structure in the lateral segment of left lobe of liver is identified. Based on previous imaging this is favored to represent a liver hemangioma (image 62  of series 2 from study dated 10/13/2016). No additional focal liver abnormalities. Gallbladder is unremarkable. No biliary dilatation. Pancreas: Normal appearance of the pancreas. Spleen: The spleen is unremarkable. Adrenals/Urinary Tract: Normal appearance of the adrenal glands. The left kidney appears normal. Small hypodensity within the upper pole of the right kidney is unchanged from previous exam measuring 6 mm. Urinary bladder is unremarkable. Stomach/Bowel: Unremarkable appearance of the stomach. Marked increase caliber of the proximal small bowel loops and multiple small bowel air-fluid levels are identified compatible with small bowel obstruction. The mid and distal small bowel loops exhibit diffuse abnormal wall thickening with mucosal enhancement up to the level of the ileocecal bowel. Findings compatible with either infectious or inflammatory enteritis. There is a large right inguinal hernia which contains a loop of thickened small bowel, image 85 of series 2. No definite evidence for pneumatosis or gas within the portal venous system. Liquid stool identified throughout the colon. The mass at the level of the distal rectum and anus measures approximately 5.8 x 4.2 cm, image 61 of series 6. Previously 7.6 x 5.2 cm. Vascular/Lymphatic: Aortic atherosclerosis. No upper abdominal adenopathy. Index right common iliac node measures 1.4 cm, image 51 of series 2. Previously 1.3 cm. Reproductive: Prostate is  unremarkable. Other: Trace ascites identified. Large right inguinal hernia as above. Smaller left inguinal hernia identified. 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 IMPRESSION: 1. Abnormal dilatation of the proximal small bowel loops worrisome for either small bowel obstruction versus small bowel ileus. 2. 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 3. Large right inguinal hernia containing edematous loop of distal small bowel 4. No pneumatosis or bowel perforation.  No abscess. 5. Aortic atherosclerosis 6. Mild decrease in size of rectal mass. No change in right common iliac adenopathy. These results were called by telephone at the time of interpretation on 12/08/2016 at 10:38 am to Dr. Shanon Brow Rashel Okeefe , who verbally acknowledged these results. Electronically Signed   By: Kerby Moors M.D.   On: 12/08/2016 10:39    Shellee Streng, DO  [facility] Pager [phone]  If 7PM-7AM, please contact night-coverage [email or web address] Password [email or web address] 12/08/2016, 11:44 AM   LOS: 5 days

## 2016-12-08 NOTE — Progress Notes (Signed)
[facility] Therapy Treatment Record Phone [phone]   Radiation Therapy was administered to David Clarke on: 12/08/2016  10:01 AM and was treatment 23 out of a planned course of 30 treatments.  Radiation Treatment  1). Beam photons with 6-10 energy  2). Brachytherapy None  3). Stereotactic Radiosurgery None  4). Other Radiation None     Jacques Earthly, RT (T)

## 2016-12-08 NOTE — Progress Notes (Signed)
Nutrition Follow Up Note   INTERVENTION:   Premier Protein BID, each supplement provides 160kcal and 30g protein.   MVI  D/C Ensure as pt does not like them  NUTRITION DIAGNOSIS:   Increased nutrient needs related to cancer and cancer related treatments as evidenced by increased estimated needs from protein.  GOAL:   Patient will meet greater than or equal to 90% of their needs  MONITOR:   PO intake, Supplement acceptance, Labs, Weight trends, I & O's  ASSESSMENT:   61 y.o. male with medical history of anorectal cancer on Xeloda who presents with diarrhea 6-7 episodes a day and a fever of 101.5 today. He has been having this for 3-4 days. No nausea or vomiting. No abdominal pain. No blood on stool. Has been feeling lightheaded with fever. Quite weak. Poor appetite and feels dehydrated. Making small amounts of dark urine  Pt feeling better, appetite improving. Per RN, pt started eating more yesterday evening and drinking Premier Protein supplements. Pt continues to have watery diarrhea but denies nausea and vomiting. Pt positive for Yersinia. Pt continues to have radiation; last treatment this morning. No new wt on pt since admit. Pt to possibly discharge this weekend per MD note.    Diet Order:  Diet regular Room service appropriate? Yes; Fluid consistency: Thin  Skin:  Reviewed, no issues  Last BM:  4/13- diarrhea  Height:   Ht Readings from Last 1 Encounters:  12/03/16 6' (1.829 m)    Weight:   Wt Readings from Last 1 Encounters:  12/03/16 137 lb (62.1 kg)    Ideal Body Weight:  80.9 kg  BMI:  Body mass index is 18.58 kg/m.  Estimated Nutritional Needs:   Kcal:  1900-2100  Protein:  90-100g  Fluid:  >2.1L/day  EDUCATION NEEDS:   No education needs identified at this time  Koleen Distance, RD, LDN Pager #[phone]

## 2016-12-08 NOTE — Consult Note (Signed)
Reason for Consult: ileus Referring Physician: Dr Tat  David Clarke is an 61 y.o. male.  HPI: 61 y.o. M undergoing ChemRT for distal rectal cancer who developed uncontrolled diarrhea at home.   He also endorses fever and low UOP with dizziness.  He was admitted to the hospital and stool cultures were obtained.   He has no h/o abdominal surgery.  He denies any vomiting.  Past Medical History:  Diagnosis Date  . Allergy   . Cancer ([place]) 10/09/2016   rectal adenocarcinoma    Past Surgical History:  Procedure Laterality Date  . COLONOSCOPY W/ BIOPSIES Bilateral 10/09/2016   rectal adenocarcinoma  . MOUTH SURGERY     age 44, to remove extra "eye" teeth  . [place] teeth extraction      Family History  Problem Relation Age of Onset  . Other Mother     bile duct tumor  . Stroke Mother   . Cancer Mother 27    bile duct cancer   . Prostate cancer Father 50  . Emphysema Father   . Colon cancer Neg Hx   . Esophageal cancer Neg Hx   . Pancreatic cancer Neg Hx   . Rectal cancer Neg Hx   . Stomach cancer Neg Hx     Social History:  reports that he quit smoking about 16 years ago. His smoking use included Cigarettes. He quit after 2.00 years of use. He has never used smokeless tobacco. He reports that he drinks about 6.0 oz of alcohol per week . He reports that he does not use drugs.  Allergies:  Allergies  Allergen Reactions  . Sulfa Antibiotics Nausea Only    Reaction may years ago, pt thinks nausea only.     Medications: I have reviewed the patient's current medications.  Results for orders placed or performed during the hospital encounter of 12/03/16 (from the past 48 hour(s))  CBC with Differential/Platelet     Status: Abnormal   Collection Time: 12/07/16  6:36 AM  Result Value Ref Range   WBC 3.6 (L) 4.0 - 10.5 K/uL   RBC 3.09 (L) 4.22 - 5.81 MIL/uL   Hemoglobin 8.4 (L) 13.0 - 17.0 g/dL   HCT 25.3 (L) 39.0 - 52.0 %   MCV 81.9 78.0 - 100.0 fL   MCH 27.2 26.0 - 34.0 pg    MCHC 33.2 30.0 - 36.0 g/dL   RDW 29.1 (H) 11.5 - 15.5 %   Platelets 332 150 - 400 K/uL   Neutrophils Relative % 80 %   Lymphocytes Relative 6 %   Monocytes Relative 14 %   Eosinophils Relative 0 %   Basophils Relative 0 %   Neutro Abs 2.9 1.7 - 7.7 K/uL   Lymphs Abs 0.2 (L) 0.7 - 4.0 K/uL   Monocytes Absolute 0.5 0.1 - 1.0 K/uL   Eosinophils Absolute 0.0 0.0 - 0.7 K/uL   Basophils Absolute 0.0 0.0 - 0.1 K/uL   RBC Morphology POLYCHROMASIA PRESENT    WBC Morphology DOHLE BODIES     Comment: TOXIC GRANULATION   Smear Review LARGE PLATELETS PRESENT   Basic metabolic panel     Status: Abnormal   Collection Time: 12/07/16  6:36 AM  Result Value Ref Range   Sodium 129 (L) 135 - 145 mmol/L   Potassium 3.0 (L) 3.5 - 5.1 mmol/L   Chloride 101 101 - 111 mmol/L   CO2 21 (L) 22 - 32 mmol/L   Glucose, Bld 113 (H) 65 - 99 mg/dL  BUN 8 6 - 20 mg/dL   Creatinine, Ser 0.61 0.61 - 1.24 mg/dL   Calcium 7.0 (L) 8.9 - 10.3 mg/dL   GFR calc non Af Amer >60 >60 mL/min   GFR calc Af Amer >60 >60 mL/min    Comment: (NOTE) The eGFR has been calculated using the CKD EPI equation. This calculation has not been validated in all clinical situations. eGFR's persistently <60 mL/min signify possible Chronic Kidney Disease.    Anion gap 7 5 - 15  Osmolality     Status: Abnormal   Collection Time: 12/07/16  1:18 PM  Result Value Ref Range   Osmolality 271 (L) 275 - 295 mOsm/kg    Comment: Performed at [facility], [place] [address] [place], [place] [address]  Osmolality, urine     Status: None   Collection Time: 12/07/16  7:34 PM  Result Value Ref Range   Osmolality, Ur 746 300 - 900 mOsm/kg    Comment: Performed at [facility] [address], [place], [place] [address]  Sodium, urine, random     Status: None   Collection Time: 12/07/16  7:34 PM  Result Value Ref Range   Sodium, Ur 122 mmol/L    Comment: Performed at [facility] [address] [place], [place] [address]   Creatinine, urine, random     Status: None   Collection Time: 12/07/16  7:34 PM  Result Value Ref Range   Creatinine, Urine 145.28 mg/dL    Comment: Performed at [facility], [place] [address], [place], [place] [address]  Urinalysis, Routine w reflex microscopic     Status: Abnormal   Collection Time: 12/07/16  7:34 PM  Result Value Ref Range   Color, Urine YELLOW YELLOW   APPearance CLEAR CLEAR   Specific Gravity, Urine 1.020 1.005 - 1.030   pH 5.0 5.0 - 8.0   Glucose, UA NEGATIVE NEGATIVE mg/dL   Hgb urine dipstick SMALL (A) NEGATIVE   Bilirubin Urine NEGATIVE NEGATIVE   Ketones, ur NEGATIVE NEGATIVE mg/dL   Protein, ur 30 (A) NEGATIVE mg/dL   Nitrite NEGATIVE NEGATIVE   Leukocytes, UA NEGATIVE NEGATIVE   RBC / HPF 0-5 0 - 5 RBC/hpf   WBC, UA 0-5 0 - 5 WBC/hpf   Bacteria, UA RARE (A) NONE SEEN   Squamous Epithelial / LPF 0-5 (A) NONE SEEN   Mucous PRESENT    Hyaline Casts, UA PRESENT   CBC with Differential/Platelet     Status: Abnormal   Collection Time: 12/08/16  6:34 AM  Result Value Ref Range   WBC 4.5 4.0 - 10.5 K/uL   RBC 3.17 (L) 4.22 - 5.81 MIL/uL   Hemoglobin 8.6 (L) 13.0 - 17.0 g/dL   HCT 26.2 (L) 39.0 - 52.0 %   MCV 82.6 78.0 - 100.0 fL   MCH 27.1 26.0 - 34.0 pg   MCHC 32.8 30.0 - 36.0 g/dL   RDW 29.5 (H) 11.5 - 15.5 %   Platelets 356 150 - 400 K/uL   Neutrophils Relative % 79 %   Lymphocytes Relative 10 %   Monocytes Relative 11 %   Eosinophils Relative 0 %   Basophils Relative 0 %   Neutro Abs 3.5 1.7 - 7.7 K/uL   Lymphs Abs 0.5 (L) 0.7 - 4.0 K/uL   Monocytes Absolute 0.5 0.1 - 1.0 K/uL   Eosinophils Absolute 0.0 0.0 - 0.7 K/uL   Basophils Absolute 0.0 0.0 - 0.1 K/uL  WBC Morphology DOHLE BODIES     Comment: TOXIC GRANULATION   Smear Review LARGE PLATELETS PRESENT   Basic metabolic panel     Status: Abnormal   Collection Time: 12/08/16  6:34 AM  Result Value Ref Range   Sodium 132 (L) 135 - 145 mmol/L   Potassium 3.5 3.5 - 5.1 mmol/L    Chloride 105 101 - 111 mmol/L   CO2 21 (L) 22 - 32 mmol/L   Glucose, Bld 108 (H) 65 - 99 mg/dL   BUN 6 6 - 20 mg/dL   Creatinine, Ser 0.56 (L) 0.61 - 1.24 mg/dL   Calcium 7.3 (L) 8.9 - 10.3 mg/dL   GFR calc non Af Amer >60 >60 mL/min   GFR calc Af Amer >60 >60 mL/min    Comment: (NOTE) The eGFR has been calculated using the CKD EPI equation. This calculation has not been validated in all clinical situations. eGFR's persistently <60 mL/min signify possible Chronic Kidney Disease.    Anion gap 6 5 - 15    Ct Abdomen Pelvis W Contrast  Result Date: 12/08/2016 CLINICAL DATA:  Scrotal pain and swelling from radiation. EXAM: CT ABDOMEN AND PELVIS WITH CONTRAST TECHNIQUE: Multidetector CT imaging of the abdomen and pelvis was performed using the standard protocol following bolus administration of intravenous contrast. CONTRAST:  153m ISOVUE-300 IOPAMIDOL (ISOVUE-300) INJECTION 61% COMPARISON:  10/13/2016. FINDINGS: Lower chest: Small right pleural effusion. Hepatobiliary: 11 mm hypodense structure in the lateral segment of left lobe of liver is identified. Based on previous imaging this is favored to represent a liver hemangioma (image 62 of series 2 from study dated 10/13/2016). No additional focal liver abnormalities. Gallbladder is unremarkable. No biliary dilatation. Pancreas: Normal appearance of the pancreas. Spleen: The spleen is unremarkable. Adrenals/Urinary Tract: Normal appearance of the adrenal glands. The left kidney appears normal. Small hypodensity within the upper pole of the right kidney is unchanged from previous exam measuring 6 mm. Urinary bladder is unremarkable. Stomach/Bowel: Unremarkable appearance of the stomach. Marked increase caliber of the proximal small bowel loops and multiple small bowel air-fluid levels are identified compatible with small bowel obstruction. The mid and distal small bowel loops exhibit diffuse abnormal wall thickening with mucosal enhancement up to the  level of the ileocecal bowel. Findings compatible with either infectious or inflammatory enteritis. There is a large right inguinal hernia which contains a loop of thickened small bowel, image 85 of series 2. No definite evidence for pneumatosis or gas within the portal venous system. Liquid stool identified throughout the colon. The mass at the level of the distal rectum and anus measures approximately 5.8 x 4.2 cm, image 61 of series 6. Previously 7.6 x 5.2 cm. Vascular/Lymphatic: Aortic atherosclerosis. No upper abdominal adenopathy. Index right common iliac node measures 1.4 cm, image 51 of series 2. Previously 1.3 cm. Reproductive: Prostate is unremarkable. Other: Trace ascites identified. Large right inguinal hernia as above. Smaller left inguinal hernia identified. 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 IMPRESSION: 1. Abnormal dilatation of the proximal small bowel loops worrisome for either small bowel obstruction versus small bowel ileus. 2. 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 3. Large right inguinal hernia containing edematous loop of distal small bowel 4. No pneumatosis or bowel perforation.  No abscess. 5. Aortic atherosclerosis 6. Mild decrease in size of rectal mass. No change in right common iliac adenopathy. These results were called by telephone  at the time of interpretation on 12/08/2016 at 10:38 am to Dr. Shanon Brow TAT , who verbally acknowledged these results. Electronically Signed   By: Kerby Moors M.D.   On: 12/08/2016 10:39    Review of Systems  Constitutional: Negative for chills and fever.  Eyes: Negative for blurred vision and double vision.  Respiratory: Negative for cough and shortness of breath.   Cardiovascular: Negative for chest pain and palpitations.  Gastrointestinal: Positive for abdominal pain and diarrhea. Negative for blood in  stool, nausea and vomiting.  Genitourinary: Negative for dysuria, frequency and urgency.  Skin: Negative for itching and rash.   Blood pressure 121/75, pulse 77, temperature 99 F (37.2 C), temperature source Oral, resp. rate 18, height 6' (1.829 m), weight 62.1 kg (137 lb), SpO2 100 %. Physical Exam  Constitutional: He is oriented to person, place, and time. He appears well-developed and well-nourished.  HENT:  Head: Normocephalic and atraumatic.  Eyes: Conjunctivae and EOM are normal. Pupils are equal, round, and reactive to light.  GI: Soft. He exhibits no distension. There is tenderness (mild).  Musculoskeletal: Normal range of motion.  Neurological: He is alert and oriented to person, place, and time.    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  CT scan shows terminal ileitis which is consistent with his diagnosis.  Dilated loops of small bowel are most likely due to ileus and obstruction from edematous ileum seen on CT. There is no need for surgical intervention at this time.   I suspect his symptoms will improve as his treatment continues.  Will sign off.  Please call with any questions or concerns.      Ebbie Cherry C. 10/19/9796, 9:21 PM

## 2016-12-09 LAB — BASIC METABOLIC PANEL
Anion gap: 5 (ref 5–15)
CALCIUM: 7.5 mg/dL — AB (ref 8.9–10.3)
CO2: 21 mmol/L — ABNORMAL LOW (ref 22–32)
Chloride: 107 mmol/L (ref 101–111)
Creatinine, Ser: 0.59 mg/dL — ABNORMAL LOW (ref 0.61–1.24)
GFR calc Af Amer: >60 mL/min (ref >60)
GLUCOSE: 122 mg/dL — AB (ref 65–99)
POTASSIUM: 3.4 mmol/L — AB (ref 3.5–5.1)
Sodium: 133 mmol/L — ABNORMAL LOW (ref 135–145)

## 2016-12-09 LAB — URINE CULTURE: CULTURE: NO GROWTH

## 2016-12-09 LAB — MAGNESIUM: Magnesium: 1.8 mg/dL (ref 1.7–2.4)

## 2016-12-09 MED ORDER — CIPROFLOXACIN HCL 500 MG PO TABS: 500.0000 mg | ORAL_TABLET | Freq: Two times a day (BID) | ORAL | 0 refills | Status: DC

## 2016-12-09 NOTE — Discharge Summary (Signed)
Physician Discharge Summary  Lorren Splawn IHW:[id number] DOB: [birth year] DOA: 12/03/2016  PCP: Robyn Haber, MD  Admit date: 12/03/2016 Discharge date: 12/09/2016  Admitted From: HOME Disposition:  Home  Recommendations for Outpatient Follow-up:  1. Follow up with PCP in 1-2 weeks 2. Please obtain BMP/CBC in one week   Home Health: no Equipment/Devices: N?A  Discharge Condition: Stable CODE STATUS: FULL Diet recommendation: Heart Healthy   Brief/Interim Summary: 61 year old Caucasian male with a past medical history of anorectal cancer on Xeloda also receiving radiation treatment. This cancer was diagnosed in February. He presented with few days to one week history of diarrhea with 6-7 loose stools daily, along with a fever of 101.5 on the day of admission. Patient was also noted to be hypokalemic, hyponatremic. Hospitalized for further management. Stool studies were sent. C. difficile was negative. GI pathogen panel was positive for Yersinia.  Discharge Diagnoses:  Yersinia Enterocolitis -manifesting as diarrhea -GI Pathogen panel positive for yersinia.  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 Diff negative -continue cipro-->home with 10 more days to complete 15 days of therapy  Ileus  -4/13 CT abd/pelvis--marked increase caliber proximal small bowel loops and multiple small bowel A/F levels concerning for SBO; mid and distal small bowel loops with diffuse abnormal thickening and enhancement up to the ileocecal bowel consistent with infection; large right inguinal hernia with thickened small bowel loops -clinically does not appear obstructed--no vomiting, tolerating regular diet -d/c imodium -changes  related to his infectious process -consult general surgery-->no surgical intervention  Persistent fever -suspect neoplastic/tumor fever vs radiation related fever -??superimposed cellulitis over radiation dermatitis -CT  abd/pelvis--ileus vs SBO -blood cultures neg -UA--no pyuria -CXR neg -Flu--neg -afebrile 48-72 hours prior to d/c  Hyponatremia -secondary to volume depletion and poor solute intake -hypovolemic hyponatremia considering his GI symptoms.  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 CBC is normal. QT interval is normal, normal EKG.  -check mag--1.8  Rectal adenocarcinoma/Radiation dermatitis -Xeloda is on hold. Patient seen by his oncologist. He is undergoing radiation as well.   Iron deficiency anemia due to chronic blood loss/leukopenia -drop in Hgb partly due to dilution -leukopenia and anemia also attributable to Xeloda   Discharge Instructions  Discharge Instructions    Diet - low sodium heart healthy    Complete by:  As directed    Increase activity slowly    Complete by:  As directed      Allergies as of 12/09/2016      Reactions   Sulfa Antibiotics Nausea Only   Reaction may years ago, pt thinks nausea only.       Medication List    STOP taking these medications   loperamide 2 MG tablet Commonly known as:  IMODIUM A-D     TAKE these medications   capecitabine 500 MG tablet Commonly known as:  XELODA Take 3 tablets (1,500 mg total) by mouth 2 (two) times daily after a meal. Refill 60 tabs only.   ciprofloxacin 500 MG tablet Commonly known as:  CIPRO Take 1 tablet (500 mg total) by mouth 2 (two) times daily.   MULTIVITAMIN ADULT PO Take 1 tablet by mouth daily.   vitamin C 1000 MG tablet Take 1,000 mg by mouth daily.       Allergies  Allergen Reactions  . Sulfa  Antibiotics Nausea Only    Reaction may years ago, pt thinks nausea only.     Consultations:  Med Onc--Feng  General surgery   Procedures/Studies: Dg Chest 2 View  Result Date:  12/03/2016 CLINICAL DATA:  Intermittent fever, current chemotherapy and radiation therapy for rectal tumor. EXAM: CHEST  2 VIEW COMPARISON:  None. FINDINGS: Heart size and mediastinal contours are normal. Lungs are hyperexpanded. Lungs are clear. Nodular density projected over the left upper lung, measuring 10 mm, is compatible with a benign bone island demonstrated on CT of 10/13/2016. No pleural effusion or pneumothorax seen. No acute or suspicious osseous finding. IMPRESSION: 1. No active cardiopulmonary disease. No evidence of pneumonia or pulmonary edema. No evidence of intrathoracic metastasis. 2. Hyperexpanded lungs suggesting COPD. Electronically Signed   By: Franki Cabot M.D.   On: 12/03/2016 12:05   Ct Abdomen Pelvis W Contrast  Result Date: 12/08/2016 CLINICAL DATA:  Scrotal pain and swelling from radiation. EXAM: CT ABDOMEN AND PELVIS WITH CONTRAST TECHNIQUE: Multidetector CT imaging of the abdomen and pelvis was performed using the standard protocol following bolus administration of intravenous contrast. CONTRAST:  132mL ISOVUE-300 IOPAMIDOL (ISOVUE-300) INJECTION 61% COMPARISON:  10/13/2016. FINDINGS: Lower chest: Small right pleural effusion. Hepatobiliary: 11 mm hypodense structure in the lateral segment of left lobe of liver is identified. Based on previous imaging this is favored to represent a liver hemangioma (image 62 of series 2 from study dated 10/13/2016). No additional focal liver abnormalities. Gallbladder is unremarkable. No biliary dilatation. Pancreas: Normal appearance of the pancreas. Spleen: The spleen is unremarkable. Adrenals/Urinary Tract: Normal appearance of the adrenal glands. The left kidney appears normal. Small hypodensity within the upper pole of the right kidney is unchanged from previous exam measuring 6 mm. Urinary bladder is unremarkable. Stomach/Bowel: Unremarkable appearance of the stomach. Marked increase caliber of the proximal small bowel loops and multiple  small bowel air-fluid levels are identified compatible with small bowel obstruction. The mid and distal small bowel loops exhibit diffuse abnormal wall thickening with mucosal enhancement up to the level of the ileocecal bowel. Findings compatible with either infectious or inflammatory enteritis. There is a large right inguinal hernia which contains a loop of thickened small bowel, image 85 of series 2. No definite evidence for pneumatosis or gas within the portal venous system. Liquid stool identified throughout the colon. The mass at the level of the distal rectum and anus measures approximately 5.8 x 4.2 cm, image 61 of series 6. Previously 7.6 x 5.2 cm. Vascular/Lymphatic: Aortic atherosclerosis. No upper abdominal adenopathy. Index right common iliac node measures 1.4 cm, image 51 of series 2. Previously 1.3 cm. Reproductive: Prostate is unremarkable. Other: Trace ascites identified. Large right inguinal hernia as above. Smaller left inguinal hernia identified. 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 IMPRESSION: 1. Abnormal dilatation of the proximal small bowel loops worrisome for either small bowel obstruction versus small bowel ileus. 2. 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 3. Large right inguinal hernia containing edematous loop of distal small bowel 4. No pneumatosis or bowel perforation.  No abscess. 5. Aortic atherosclerosis 6. Mild decrease in size of rectal mass. No change in right common iliac adenopathy. These results were called by telephone at the time of interpretation on 12/08/2016 at 10:38 am to Dr. Shanon Brow Broedy Osbourne , who verbally acknowledged these results. Electronically Signed   By: Kerby Moors M.D.   On: 12/08/2016  10:39         Discharge Exam: Vitals:   12/09/16 0453 12/09/16 0945  BP: 112/65 114/62  Pulse: 99 73  Resp: 20 18  Temp: 100  F (37.8 C) 97.9 F (36.6 C)   Vitals:   12/08/16 2043 12/08/16 2322 12/09/16 0453 12/09/16 0945  BP: 125/77  112/65 114/62  Pulse: 88  99 73  Resp: 20  20 18   Temp: 98.1 F (36.7 C) 99.7 F (37.6 C) 100 F (37.8 C) 97.9 F (36.6 C)  TempSrc: Oral Oral Oral Oral  SpO2: 99%  100% 100%  Weight:      Height:        General: Pt is alert, awake, not in acute distress Cardiovascular: RRR, S1/S2 +, no rubs, no gallops Respiratory: CTA bilaterally, no wheezing, no rhonchi Abdominal: Soft, NT, ND, bowel sounds + Extremities: no edema, no cyanosis   The results of significant diagnostics from this hospitalization (including imaging, microbiology, ancillary and laboratory) are listed below for reference.    Significant Diagnostic Studies: Dg Chest 2 View  Result Date: 12/03/2016 CLINICAL DATA:  Intermittent fever, current chemotherapy and radiation therapy for rectal tumor. EXAM: CHEST  2 VIEW COMPARISON:  None. FINDINGS: Heart size and mediastinal contours are normal. Lungs are hyperexpanded. Lungs are clear. Nodular density projected over the left upper lung, measuring 10 mm, is compatible with a benign bone island demonstrated on CT of 10/13/2016. No pleural effusion or pneumothorax seen. No acute or suspicious osseous finding. IMPRESSION: 1. No active cardiopulmonary disease. No evidence of pneumonia or pulmonary edema. No evidence of intrathoracic metastasis. 2. Hyperexpanded lungs suggesting COPD. Electronically Signed   By: Franki Cabot M.D.   On: 12/03/2016 12:05   Ct Abdomen Pelvis W Contrast  Result Date: 12/08/2016 CLINICAL DATA:  Scrotal pain and swelling from radiation. EXAM: CT ABDOMEN AND PELVIS WITH CONTRAST TECHNIQUE: Multidetector CT imaging of the abdomen and pelvis was performed using the standard protocol following bolus administration of intravenous contrast. CONTRAST:  146mL ISOVUE-300 IOPAMIDOL (ISOVUE-300) INJECTION 61% COMPARISON:  10/13/2016. FINDINGS: Lower chest:  Small right pleural effusion. Hepatobiliary: 11 mm hypodense structure in the lateral segment of left lobe of liver is identified. Based on previous imaging this is favored to represent a liver hemangioma (image 62 of series 2 from study dated 10/13/2016). No additional focal liver abnormalities. Gallbladder is unremarkable. No biliary dilatation. Pancreas: Normal appearance of the pancreas. Spleen: The spleen is unremarkable. Adrenals/Urinary Tract: Normal appearance of the adrenal glands. The left kidney appears normal. Small hypodensity within the upper pole of the right kidney is unchanged from previous exam measuring 6 mm. Urinary bladder is unremarkable. Stomach/Bowel: Unremarkable appearance of the stomach. Marked increase caliber of the proximal small bowel loops and multiple small bowel air-fluid levels are identified compatible with small bowel obstruction. The mid and distal small bowel loops exhibit diffuse abnormal wall thickening with mucosal enhancement up to the level of the ileocecal bowel. Findings compatible with either infectious or inflammatory enteritis. There is a large right inguinal hernia which contains a loop of thickened small bowel, image 85 of series 2. No definite evidence for pneumatosis or gas within the portal venous system. Liquid stool identified throughout the colon. The mass at the level of the distal rectum and anus measures approximately 5.8 x 4.2 cm, image 61 of series 6. Previously 7.6 x 5.2 cm. Vascular/Lymphatic: Aortic atherosclerosis. No upper abdominal adenopathy. Index right common iliac node measures 1.4 cm, image 51 of series 2.  Previously 1.3 cm. Reproductive: Prostate is unremarkable. Other: Trace ascites identified. Large right inguinal hernia as above. Smaller left inguinal hernia identified. 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 IMPRESSION: 1. Abnormal dilatation of the proximal small bowel loops worrisome for either  small bowel obstruction versus small bowel ileus. 2. 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 3. Large right inguinal hernia containing edematous loop of distal small bowel 4. No pneumatosis or bowel perforation.  No abscess. 5. Aortic atherosclerosis 6. Mild decrease in size of rectal mass. No change in right common iliac adenopathy. These results were called by telephone at the time of interpretation on 12/08/2016 at 10:38 am to Dr. Shanon Brow Alma Mohiuddin , who verbally acknowledged these results. Electronically Signed   By: Kerby Moors M.D.   On: 12/08/2016 10:39     Microbiology: Recent Results (from the past 240 hour(s))  Culture, blood (Routine x 2)     Status: None   Collection Time: 12/03/16 11:35 AM  Result Value Ref Range Status   Specimen Description BLOOD RIGHT ANTECUBITAL  Final   Special Requests   Final    BOTTLES DRAWN AEROBIC AND ANAEROBIC Blood Culture adequate volume   Culture   Final    NO GROWTH 5 DAYS Performed at [facility], [address], [place], [place] [address]    Report Status 12/08/2016 FINAL  Final  Culture, blood (Routine x 2)     Status: None   Collection Time: 12/03/16 11:40 AM  Result Value Ref Range Status   Specimen Description BLOOD BLOOD LEFT FOREARM  Final   Special Requests   Final    BOTTLES DRAWN AEROBIC AND ANAEROBIC Blood Culture adequate volume   Culture   Final    NO GROWTH 5 DAYS Performed at [facility], [place] [address] [place],  [address]    Report Status 12/08/2016 FINAL  Final  C difficile quick scan w PCR reflex     Status: None   Collection Time: 12/03/16  1:02 PM  Result Value Ref Range Status   C Diff antigen NEGATIVE NEGATIVE Final   C Diff toxin NEGATIVE NEGATIVE Final   C Diff interpretation No C. difficile detected.  Final  Gastrointestinal Panel by PCR , Stool     Status: Abnormal   Collection Time: 12/03/16   1:02 PM  Result Value Ref Range Status   Campylobacter species NOT DETECTED NOT DETECTED Final   Plesimonas shigelloides NOT DETECTED NOT DETECTED Final   Salmonella species NOT DETECTED NOT DETECTED Final   Yersinia enterocolitica DETECTED (A) NOT DETECTED Final    Comment: RESULT CALLED TO, READ BACK BY AND VERIFIED WITH: ABBY PINNIX 12/04/16 @ 1414  MLK    Vibrio species NOT DETECTED NOT DETECTED Final   Vibrio cholerae NOT DETECTED NOT DETECTED Final   Enteroaggregative E coli (EAEC) NOT DETECTED NOT DETECTED Final   Enteropathogenic E coli (EPEC) NOT DETECTED NOT DETECTED Final   Enterotoxigenic E coli (ETEC) NOT DETECTED NOT DETECTED Final   Shiga like toxin producing E coli (STEC) NOT DETECTED NOT DETECTED Final   Shigella/Enteroinvasive E coli (EIEC) NOT DETECTED NOT DETECTED Final   Cryptosporidium NOT DETECTED NOT DETECTED Final   Cyclospora cayetanensis NOT DETECTED NOT DETECTED Final   Entamoeba histolytica NOT DETECTED NOT DETECTED Final   Giardia lamblia NOT DETECTED NOT DETECTED Final   Adenovirus F40/41 NOT DETECTED NOT  DETECTED Final   Astrovirus NOT DETECTED NOT DETECTED Final   Norovirus GI/GII NOT DETECTED NOT DETECTED Final   Rotavirus A NOT DETECTED NOT DETECTED Final   Sapovirus (I, II, IV, and V) NOT DETECTED NOT DETECTED Final  Culture, Urine     Status: None   Collection Time: 12/07/16  7:34 PM  Result Value Ref Range Status   Specimen Description URINE, CLEAN CATCH  Final   Special Requests NONE  Final   Culture   Final    NO GROWTH Performed at [facility], [address], [place], [place] [address]    Report Status 12/09/2016 FINAL  Final     Labs: Basic Metabolic Panel:  Recent Labs Lab 12/03/16 1135 12/04/16 0606 12/05/16 7867 12/06/16 0619 12/07/16 0636 12/08/16 0634 12/09/16 0617  NA 126* 127* 129* 130* 129* 132* 133*  K 2.5* 3.0* 4.0 3.6 3.0* 3.5 3.4*  CL 92* 97* 100* 101 101 105 107  CO2 27 24 25 25  21* 21* 21*  GLUCOSE  141* 127* 115* 135* 113* 108* 122*  BUN 9 8 8 11 8 6  <5*  CREATININE 0.64 0.64 0.67 0.55* 0.61 0.56* 0.59*  CALCIUM 7.7* 6.8* 7.1* 7.4* 7.0* 7.3* 7.5*  MG 1.4* 2.2 2.0 1.9  --   --  1.8   Liver Function Tests:  Recent Labs Lab 12/03/16 1135  AST 17  ALT 19  ALKPHOS 61  BILITOT 0.8  PROT 5.4*  ALBUMIN 2.6*   No results for input(s): LIPASE, AMYLASE in the last 168 hours. No results for input(s): AMMONIA in the last 168 hours. CBC:  Recent Labs Lab 12/04/16 0606 12/05/16 0647 12/06/16 0619 12/07/16 0636 12/08/16 0634  WBC 2.2* 3.8* 3.2* 3.6* 4.5  NEUTROABS 1.8 3.2 2.5 2.9 3.5  HGB 9.0* 8.4* 8.6* 8.4* 8.6*  HCT 27.1* 26.1* 26.8* 25.3* 26.2*  MCV 81.4 80.3 80.5 81.9 82.6  PLT 260 273 302 332 356   Cardiac Enzymes: No results for input(s): CKTOTAL, CKMB, CKMBINDEX, TROPONINI in the last 168 hours. BNP: Invalid input(s): POCBNP CBG: No results for input(s): GLUCAP in the last 168 hours.  Time coordinating discharge:  Greater than 30 minutes  Signed:  Danaja Lasota, DO [facility] Pager: [phone] 12/09/2016, 12:56 PM

## 2016-12-11 ENCOUNTER — Ambulatory Visit: Payer: BLUE CROSS/BLUE SHIELD

## 2016-12-11 ENCOUNTER — Ambulatory Visit (HOSPITAL_BASED_OUTPATIENT_CLINIC_OR_DEPARTMENT_OTHER): Payer: BLUE CROSS/BLUE SHIELD | Admitting: Hematology

## 2016-12-11 ENCOUNTER — Encounter: Payer: Self-pay | Admitting: Hematology

## 2016-12-11 ENCOUNTER — Ambulatory Visit
Admission: RE | Admit: 2016-12-11 | Discharge: 2016-12-11 | Disposition: A | Discharge status: Home / Self Care | Payer: BLUE CROSS/BLUE SHIELD | Source: Ambulatory Visit | Attending: Radiation Oncology | Admitting: Radiation Oncology

## 2016-12-11 ENCOUNTER — Other Ambulatory Visit (HOSPITAL_BASED_OUTPATIENT_CLINIC_OR_DEPARTMENT_OTHER): Payer: BLUE CROSS/BLUE SHIELD

## 2016-12-11 VITALS — BP 107/72 | HR 94 | Temp 98.1°F | Resp 18 | Ht 72.0 in | Wt 125.0 lb

## 2016-12-11 DIAGNOSIS — Z79899 Other long term (current) drug therapy: Secondary | ICD-10-CM | POA: Diagnosis not present

## 2016-12-11 DIAGNOSIS — E876 Hypokalemia: Secondary | ICD-10-CM | POA: Diagnosis not present

## 2016-12-11 DIAGNOSIS — Z148 Genetic carrier of other disease: Secondary | ICD-10-CM | POA: Diagnosis not present

## 2016-12-11 DIAGNOSIS — K922 Gastrointestinal hemorrhage, unspecified: Secondary | ICD-10-CM | POA: Diagnosis not present

## 2016-12-11 DIAGNOSIS — Z8042 Family history of malignant neoplasm of prostate: Secondary | ICD-10-CM | POA: Diagnosis not present

## 2016-12-11 DIAGNOSIS — Z87891 Personal history of nicotine dependence: Secondary | ICD-10-CM | POA: Diagnosis not present

## 2016-12-11 DIAGNOSIS — C20 Malignant neoplasm of rectum: Secondary | ICD-10-CM | POA: Diagnosis not present

## 2016-12-11 DIAGNOSIS — D500 Iron deficiency anemia secondary to blood loss (chronic): Secondary | ICD-10-CM

## 2016-12-11 DIAGNOSIS — A046 Enteritis due to Yersinia enterocolitica: Secondary | ICD-10-CM | POA: Diagnosis not present

## 2016-12-11 DIAGNOSIS — Z510 Encounter for antineoplastic radiation therapy: Secondary | ICD-10-CM | POA: Diagnosis present

## 2016-12-11 DIAGNOSIS — R197 Diarrhea, unspecified: Secondary | ICD-10-CM | POA: Diagnosis not present

## 2016-12-11 DIAGNOSIS — R634 Abnormal weight loss: Secondary | ICD-10-CM | POA: Diagnosis not present

## 2016-12-11 DIAGNOSIS — Z825 Family history of asthma and other chronic lower respiratory diseases: Secondary | ICD-10-CM | POA: Diagnosis not present

## 2016-12-11 DIAGNOSIS — Z823 Family history of stroke: Secondary | ICD-10-CM | POA: Diagnosis not present

## 2016-12-11 DIAGNOSIS — R5383 Other fatigue: Secondary | ICD-10-CM

## 2016-12-11 DIAGNOSIS — Z882 Allergy status to sulfonamides status: Secondary | ICD-10-CM | POA: Diagnosis not present

## 2016-12-11 LAB — CBC WITH DIFFERENTIAL/PLATELET
BASO%: 0.4 % (ref 0.0–2.0)
Basophils Absolute: 0 10*3/uL (ref 0.0–0.1)
EOS ABS: 0 10*3/uL (ref 0.0–0.5)
EOS%: 0.3 % (ref 0.0–7.0)
HCT: 30.7 % — ABNORMAL LOW (ref 38.4–49.9)
HGB: 9.9 g/dL — ABNORMAL LOW (ref 13.0–17.1)
LYMPH%: 4.7 % — AB (ref 14.0–49.0)
MCH: 26.5 pg — AB (ref 27.2–33.4)
MCHC: 32.2 g/dL (ref 32.0–36.0)
MCV: 82.1 fL (ref 79.3–98.0)
MONO#: 0.7 10*3/uL (ref 0.1–0.9)
MONO%: 9 % (ref 0.0–14.0)
NEUT%: 85.6 % — ABNORMAL HIGH (ref 39.0–75.0)
NEUTROS ABS: 6.8 10*3/uL — AB (ref 1.5–6.5)
PLATELETS: 423 10*3/uL — AB (ref 140–400)
RBC: 3.74 10*6/uL — AB (ref 4.20–5.82)
RDW: 29 % — ABNORMAL HIGH (ref 11.0–14.6)
WBC: 7.9 10*3/uL (ref 4.0–10.3)
lymph#: 0.4 10*3/uL — ABNORMAL LOW (ref 0.9–3.3)
nRBC: 0 % (ref 0–0)

## 2016-12-11 LAB — COMPREHENSIVE METABOLIC PANEL
ALK PHOS: 91 U/L (ref 40–150)
ALT: 29 U/L (ref 0–55)
ANION GAP: 11 meq/L (ref 3–11)
AST: 17 U/L (ref 5–34)
Albumin: 1.9 g/dL — ABNORMAL LOW (ref 3.5–5.0)
BILIRUBIN TOTAL: 0.42 mg/dL (ref 0.20–1.20)
BUN: 4.4 mg/dL — AB (ref 7.0–26.0)
CHLORIDE: 101 meq/L (ref 98–109)
CO2: 21 mEq/L — ABNORMAL LOW (ref 22–29)
Calcium: 8 mg/dL — ABNORMAL LOW (ref 8.4–10.4)
Creatinine: 0.7 mg/dL (ref 0.7–1.3)
EGFR: >90 mL/min/{1.73_m2} (ref >90)
Glucose: 134 mg/dl (ref 70–140)
Potassium: 2.8 mEq/L — CL (ref 3.5–5.1)
Sodium: 133 mEq/L — ABNORMAL LOW (ref 136–145)
Total Protein: 5.1 g/dL — ABNORMAL LOW (ref 6.4–8.3)

## 2016-12-11 MED ORDER — POTASSIUM CHLORIDE CRYS ER 20 MEQ PO TBCR: 20.0000 meq | EXTENDED_RELEASE_TABLET | Freq: Two times a day (BID) | ORAL | 1 refills | Status: DC

## 2016-12-12 ENCOUNTER — Ambulatory Visit (HOSPITAL_BASED_OUTPATIENT_CLINIC_OR_DEPARTMENT_OTHER): Payer: BLUE CROSS/BLUE SHIELD | Admitting: Hematology

## 2016-12-12 ENCOUNTER — Other Ambulatory Visit: Payer: Self-pay | Admitting: *Deleted

## 2016-12-12 ENCOUNTER — Ambulatory Visit: Payer: BLUE CROSS/BLUE SHIELD

## 2016-12-12 ENCOUNTER — Encounter: Payer: Self-pay | Admitting: Hematology

## 2016-12-12 ENCOUNTER — Telehealth: Payer: Self-pay | Admitting: Hematology

## 2016-12-12 ENCOUNTER — Ambulatory Visit
Admission: RE | Admit: 2016-12-12 | Discharge: 2016-12-12 | Disposition: A | Discharge status: Home / Self Care | Payer: BLUE CROSS/BLUE SHIELD | Source: Ambulatory Visit | Attending: Radiation Oncology | Admitting: Radiation Oncology

## 2016-12-12 VITALS — BP 107/76 | HR 85 | Temp 97.7°F | Resp 18 | Ht 72.0 in | Wt 126.6 lb

## 2016-12-12 DIAGNOSIS — Z510 Encounter for antineoplastic radiation therapy: Secondary | ICD-10-CM | POA: Diagnosis not present

## 2016-12-12 DIAGNOSIS — C20 Malignant neoplasm of rectum: Secondary | ICD-10-CM

## 2016-12-12 DIAGNOSIS — B370 Candidal stomatitis: Secondary | ICD-10-CM | POA: Diagnosis not present

## 2016-12-12 MED ORDER — NYSTATIN 100000 UNIT/ML MT SUSP: 5.0000 mL | Freq: Four times a day (QID) | OROMUCOSAL | 0 refills | Status: DC

## 2016-12-12 MED ORDER — MAGIC MOUTHWASH W/LIDOCAINE: 5.0000 mL | Freq: Four times a day (QID) | ORAL | 0 refills | Status: DC | PRN

## 2016-12-12 NOTE — Progress Notes (Signed)
Nutrition Follow-up:  Nutrition follow-up completed with patient following radiation appointment this am in clinic.  Patient being treated for rectal cancer and is patient of Dr. Burr Medico  Patient has recently been in the hospital with Yersinia colitis. Patient currently on cipro and will start taking imodium again tomorrow under the direction of Dr. Burr Medico for diarrhea.   Patient reports appetite is coming back. 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  Has been eating small frequent meals due to feeling of early satiety.  Reports eating pasta with pesto and mandarin oranges for dinner last night and green beans.  Reports that he has gotten protein powder and will start mixing it with soy milk soon.  Has not tried [place] or orgain products yet.  Patient reports has 5 more radiation treatments to go.   Medications: xeloda stopped currently, KCL, MVI  Labs: K 2.8 (4/16), Na 133  Anthropometrics:   Patient weight decreased to 125 pounds, from 136.1 pounds on 4/3   NUTRITION DIAGNOSIS: Unintentional weight loss continues    INTERVENTION:   Encouraged foods with increased calories and protein to help patient meet goal of weight gain. Encouraged oral nutrition supplements with increased calories and protein Discussed banana flakes to try and reduce diarrhea as well.   Encouraged patient to continue small frequent meals.       MONITORING, EVALUATION, GOAL: Patient will tolerate increased calories and protein to minimize further weight loss and promote healing   NEXT VISIT: Tuesday, April 24th after last radiation treatment  Curties Conigliaro B. Zenia Resides, [place], [place] Registered Dietitian [phone] (pager)

## 2016-12-12 NOTE — Progress Notes (Signed)
[place]  [phone] [phone]  Clinic Follow Up Note   Patient Care Team: Robyn Haber, MD as PCP - General (Family Medicine) Michael Boston, MD as Consulting Physician (General Surgery) Jerene Bears, MD as Consulting Physician (Gastroenterology) Truitt Merle, MD as Consulting Physician (Hematology) Kyung Rudd, MD as Consulting Physician (Radiation Oncology) 12/12/2016  CHIEF COMPLAINTS:  Oral thrush   Oncology History   Cancer Staging Rectal adenocarcinoma  Staging form: Colon and Rectum, AJCC 8th Edition - Clinical stage from 10/09/2016: Stage IIIB (cT3, cN1b, cM0) - Signed by Truitt Merle, MD on 10/25/2016       Rectal adenocarcinoma    10/03/2016 - 10/03/2016 Hospital Admission    Admit date: 10/03/16 The patient presented to the [facility] ED with rectal bleeding and difficulty with hygiene.      10/09/2016 Initial Diagnosis    Rectal adenocarcinoma       10/09/2016 Procedure    Colonoscopy showed a fungating, infiltrative and ulcerated partially obstructing large mass in the distal rectum, measuring 9 cm, prolapsing at anal canal. Scope not advanced proximal to the rectosigmoid colon.      10/09/2016 Initial Biopsy    Rectal mass biopsy showed invasive adenocarcinoma.      10/13/2016 Imaging    CT Chest Abdomen Pelvis w contrast IMPRESSION: Large irregular enhancing rectal mass consistent with known rectal cancer. There are small perirectal and small pelvic sidewall lymph nodes which are suspicious. Prominent perirectal vessels are also noted. No evidence of metastatic retroperitoneal or sigmoid mesocolon adenopathy or hepatic or pulmonary metastasis.      10/24/2016 Imaging    MRI Pelvis IMPRESSION: 1. Moderately motion degraded exam. 2. Large mass is centered at the anus with extension into the low rectum. No complicating obstruction. 3. Mild mesorectal adenopathy, suspicious. 4. Right inguinal hernia containing nonobstructive  small bowel.       11/08/2016 -  Chemotherapy    Xeloda 50 mg twice daily, with concurrent radiation, held since 12/03/2016 due to colitis and hospitalization       11/08/2016 -  Radiation Therapy    Neoadjuvant radiation to his rectal cancer      12/03/2016 - 12/09/2016 Hospital Admission    He presented with few days to one week history of diarrhea with 6-7 loose stools daily, along with a fever of 101.5 on the day of admission. Patient was also noted to be hypokalemic, hyponatremic. Hospitalized for further management. Stool studies were sent. C. difficile was negative. GI pathogen panel was positive for Yersinia.  XELODA was held at this time, until infection has cleared.       HISTORY OF PRESENTING ILLNESS (10/25/16):  David Clarke 61 y.o. male is here because of his recently diagnosed rectal adenocarcinoma. He was referred by his gastroenterologist title parietal. Patient presents to my clinic, accompanied by his wife and his daughter.       He has noticed a anal mass accompanied by intermittent bleeding and pain in the rectum for the past 5 months. The patient initially presented to the [facility] ED on 10/03/16 with rectal bleeding and difficulty with hygeine. He was without health insurance previously and so did not pursue evaluation or treatment until this time.  The patient was seen by Dr. Hilarie Fredrickson for a colonoscopy and biopsy on 10/09/16, revealing a large partially obstructing low rectal mass, prolapsing to anal verge. Biopsy showed adenocarcinoma. CT C/A/P on 10/13/16 revealed a large, irregular, enhancing mass consistent with known rectal cancer. There  were several suspicious small perirectal and small pelvic sidewall lymph nodes. Prominent perirectal vessels were also noted. MRI of the pelvis on 10/24/16 showed a large mass centered at the anus with extension into the low rectum without obstruction. There was suspicious mild mesorectal adenopathy, and a right inguinal hernia containing  nonobstructive small bowel.   The patient comes to the clinic for consultation today accompanied by his partner and daughter. He denies abdominal cramps, nausea, bowel or bladder concerns. He reports taking OTC stool softener as needed. He reports his appetite is good, but he has lost 12-15 lbs in the past few weeks. His daughter notes she has hemochromatosis and questions if this could be playing a role in the patient's anemia.  CURRENT THERAPY: Current radiation and chemotherapy with Xeloda 1500 mg twice daily on the day of radiation, started on 11/08/2016, held from 12/03/2016 due to hospitalization   INTERIM HISTORY: David Clarke requested to be seen today due to his concern of oral thrush. He started noticing mild oral thrush when he was in the hospital last week, he thought it was resolved. He notices again a few days ago, with mild mouth sensitivity and pain when he swallows. No significant chest pain. No fever or chills, he has not used any mouthwash or medications for oral thrush. I saw him yesterday, he forgot to tell me about that.  MEDICAL HISTORY:  Past Medical History:  Diagnosis Date  . Allergy   . Cancer ([place]) 10/09/2016   rectal adenocarcinoma    SURGICAL HISTORY: Past Surgical History:  Procedure Laterality Date  . COLONOSCOPY W/ BIOPSIES Bilateral 10/09/2016   rectal adenocarcinoma  . MOUTH SURGERY     age 20, to remove extra "eye" teeth  . [place] teeth extraction      SOCIAL HISTORY: Social History   Social History  . Marital status: Single    Spouse name: N/A  . Number of children: 1  . Years of education: N/A   Occupational History  . restaurant/caterer    Social History Main Topics  . Smoking status: Former Smoker    Years: 2.00    Types: Cigarettes    Quit date: 2002  . Smokeless tobacco: Never Used  . Alcohol use 6.0 oz/week    4 Glasses of wine, 6 Cans of beer per week     Comment: beer or a glass or wine most days of the week  . Drug use: No  . Sexual  activity: Yes    Partners: Female   Other Topics Concern  . Not on file   Social History Narrative  . No narrative on file    FAMILY HISTORY: Family History  Problem Relation Age of Onset  . Other Mother     bile duct tumor  . Stroke Mother   . Cancer Mother 64    bile duct cancer   . Prostate cancer Father 32  . Emphysema Father   . Colon cancer Neg Hx   . Esophageal cancer Neg Hx   . Pancreatic cancer Neg Hx   . Rectal cancer Neg Hx   . Stomach cancer Neg Hx     ALLERGIES:  is allergic to sulfa antibiotics.  MEDICATIONS:  Current Outpatient Prescriptions  Medication Sig Dispense Refill  . capecitabine (XELODA) 500 MG tablet Take 3 tablets (1,500 mg total) by mouth 2 (two) times daily after a meal. Refill 60 tabs only. (Patient not taking: Reported on 12/11/2016) 60 tablet 0  . ciprofloxacin (CIPRO) 500 MG tablet  Take 1 tablet (500 mg total) by mouth 2 (two) times daily. 20 tablet 0  . magic mouthwash w/lidocaine SOLN Take 5 mLs by mouth 4 (four) times daily as needed for mouth pain. 60 mL 0  . Multiple Vitamins-Minerals (MULTIVITAMIN ADULT PO) Take 1 tablet by mouth daily.    Marland Kitchen nystatin (MYCOSTATIN) 100000 UNIT/ML suspension Take 5 mLs (500,000 Units total) by mouth 4 (four) times daily. 60 mL 0  . potassium chloride SA (K-DUR,KLOR-CON) 20 MEQ tablet Take 1 tablet (20 mEq total) by mouth 2 (two) times daily. 60 tablet 1   Current Facility-Administered Medications  Medication Dose Route Frequency Provider Last Rate Last Dose  . 0.9 %  sodium chloride infusion  500 mL Intravenous Continuous Jerene Bears, MD        REVIEW OF SYSTEMS:   Constitutional: Denies fevers, chills or abnormal night sweats (+) fever (+) weight loss Eyes: Denies blurriness of vision, double vision or watery eyes Ears, nose, mouth, throat, and face: Denies mucositis or sore throat  Respiratory: Denies cough, dyspnea or wheezes Cardiovascular: Denies palpitation, chest discomfort or lower extremity  swelling Gastrointestinal:  Denies nausea, heartburn or change in bowel habits (+) severe diarrhea  Skin: Denies abnormal skin rashes Lymphatics: Denies new lymphadenopathy or easy bruising Neurological:Denies numbness, tingling or new weaknesses Behavioral/Psych: Mood is stable, no new changes  All other systems were reviewed with the patient and are negative.   PHYSICAL EXAMINATION: ECOG PERFORMANCE STATUS: 1 - Symptomatic but completely ambulatory  Vitals:   12/12/16 1245  BP: 107/76  Pulse: 85  Resp: 18  Temp: 97.7 F (36.5 C)   Filed Weights   12/12/16 1245  Weight: 126 lb 9.6 oz (57.4 kg)   GENERAL:alert, no distress and comfortable.  SKIN: skin color, texture, turgor are normal, no rashes or significant lesions OROPHARYNX:no exudate, no erythema, mild patchy oral thrush on his tongue, and at the mucosal behind his left low molar    LABORATORY DATA:  I have reviewed the data as listed CBC Latest Ref Rng & Units 12/11/2016 12/08/2016 12/07/2016  WBC 4.0 - 10.3 10e3/uL 7.9 4.5 3.6(L)  Hemoglobin 13.0 - 17.1 g/dL 9.9(L) 8.6(L) 8.4(L)  Hematocrit 38.4 - 49.9 % 30.7(L) 26.2(L) 25.3(L)  Platelets 140 - 400 10e3/uL 423(H) 356 332   CMP Latest Ref Rng & Units 12/11/2016 12/09/2016 12/08/2016  Glucose 70 - 140 mg/dl 134 122(H) 108(H)  BUN 7.0 - 26.0 mg/dL 4.4(L) <5(L) 6  Creatinine 0.7 - 1.3 mg/dL 0.7 0.59(L) 0.56(L)  Sodium 136 - 145 mEq/L 133(L) 133(L) 132(L)  Potassium 3.5 - 5.1 mEq/L 2.8(LL) 3.4(L) 3.5  Chloride 101 - 111 mmol/L - 107 105  CO2 22 - 29 mEq/L 21(L) 21(L) 21(L)  Calcium 8.4 - 10.4 mg/dL 8.0(L) 7.5(L) 7.3(L)  Total Protein 6.4 - 8.3 g/dL 5.1(L) - -  Total Bilirubin 0.20 - 1.20 mg/dL 0.42 - -  Alkaline Phos 40 - 150 U/L 91 - -  AST 5 - 34 U/L 17 - -  ALT 0 - 55 U/L 29 - -   CEA ([facility]) 10/27/2016: 7.81 11/27/2016: 5.56  PATHOLOGY:  Diagnosis 10/09/2016 Rectum, biopsy, mass - ADENOCARCINOMA. Microscopic Comment The features are consistent with  colorectal-type adenocarcinoma.  RADIOGRAPHIC STUDIES: I have personally reviewed the radiological images as listed and agreed with the findings in the report.  DG Chest 2 View 12/03/2016 IMPRESSION: 1. No active cardiopulmonary disease. No evidence of pneumonia or pulmonary edema. No evidence of intrathoracic metastasis. 2. Hyperexpanded lungs  suggesting COPD.  MRI Pelvis 10/24/2016 IMPRESSION: 1. Moderately motion degraded exam. 2. Large mass is centered at the anus with extension into the low rectum. No complicating obstruction. 3. Mild mesorectal adenopathy, suspicious. 4. Right inguinal hernia containing nonobstructive small bowel.  CT CAP w Contrast 10/13/2016 IMPRESSION: Large irregular enhancing rectal mass consistent with known rectal cancer. There are small perirectal and small pelvic sidewall lymph nodes which are suspicious. Prominent perirectal vessels are also noted.  No evidence of metastatic retroperitoneal or sigmoid mesocolon adenopathy or hepatic or pulmonary metastasis.  COLONOSOCPY 10/09/2016 Dr. Hilarie Fredrickson  A fungating, infiltrative and ulcerated partially obstructing large mass was found in the distal rectum. The mass was circumferential. The mass measured seven cm in length (prolapsing and from dentate line to approx 7 cm). In addition, its inner diameter measured nine mm. Oozing was present. This was biopsied with a cold forceps for histology. - Due to the distal rectal mass the bowel preparation was poor and unable to be cleared via the adult upper endoscope (adult upper endoscope used due to obstructing mass). Scope not advanced proximal to the rectosigmoid colon.  ASSESSMENT & PLAN: 61 y.o.  gentleman, previously healthy, presented with a anal mass and a mild intermittent rectal bleeding for 5-6 months. Fatigue, anorexia, fatigue and weight loss for a month. He is currently on radiation for his rectal cancer, chemotherapy has been held lately.  1) oral  thrush  -I will call in nystatin mouth wash, he will rinse, spit or swallow, 4 times a day for 5-7 days -I will also call in magic mouth wash, for his mouth pain and sensitivity  -If it does not resolve by next Monday, I will call in oral Diflucan.  Plan -he will return next Monday as it's scheduled   All questions were answered. The patient knows to call the clinic with any problems, questions or concerns.  I spent 10 minutes counseling the patient face to face. The total time spent in the appointment was 10 minutes and more than 50% was on counseling.   Truitt Merle, MD 12/12/2016

## 2016-12-12 NOTE — Telephone Encounter (Signed)
12/12/16 appointment moved from Dr Benay Spice to Dr Feng/symptom mgt/30 mins, per Dr Burr Medico. (verbal)

## 2016-12-12 NOTE — Telephone Encounter (Signed)
No LOS per 12/11/16 visit.

## 2016-12-13 ENCOUNTER — Other Ambulatory Visit: Payer: Self-pay | Admitting: Radiation Oncology

## 2016-12-13 ENCOUNTER — Ambulatory Visit: Payer: BLUE CROSS/BLUE SHIELD

## 2016-12-13 ENCOUNTER — Ambulatory Visit
Admission: RE | Admit: 2016-12-13 | Discharge: 2016-12-13 | Disposition: A | Discharge status: Home / Self Care | Payer: BLUE CROSS/BLUE SHIELD | Source: Ambulatory Visit | Attending: Radiation Oncology | Admitting: Radiation Oncology

## 2016-12-13 ENCOUNTER — Telehealth: Payer: Self-pay | Admitting: Hematology

## 2016-12-13 DIAGNOSIS — Z510 Encounter for antineoplastic radiation therapy: Secondary | ICD-10-CM | POA: Diagnosis not present

## 2016-12-13 MED ORDER — FLUCONAZOLE 100 MG PO TABS: 150.0000 mg | ORAL_TABLET | Freq: Every day | ORAL | 0 refills | Status: DC

## 2016-12-13 MED ORDER — MUGARD MT LIQD: 5.0000 mL | OROMUCOSAL | 1 refills | Status: DC | PRN

## 2016-12-13 NOTE — Progress Notes (Signed)
The patient was seen at the treatment machine today complaining of pain with swallowing and oral pain. He was started on magic mouthwash and states he cannot tolerate this. He is also on a course of antibiotics for infectious diarrhea. I will call in a 1 time dose of fluconazole given his antibiotic course, though I'm more suspicious that he has mucositis when looking at his oral mucosa he doesn't have classic plaquing, and rather a few shallow ulcers along the right buccal mucosa. I will also call in [place] to an oncology specialty pharmacy in [facility] and ask them to mail the prescription to the patient.

## 2016-12-13 NOTE — Telephone Encounter (Signed)
No LOS per 12/12/16 visit.

## 2016-12-14 ENCOUNTER — Ambulatory Visit: Payer: BLUE CROSS/BLUE SHIELD

## 2016-12-14 ENCOUNTER — Ambulatory Visit
Admission: RE | Admit: 2016-12-14 | Discharge: 2016-12-14 | Disposition: A | Discharge status: Home / Self Care | Payer: BLUE CROSS/BLUE SHIELD | Source: Ambulatory Visit | Attending: Radiation Oncology | Admitting: Radiation Oncology

## 2016-12-14 DIAGNOSIS — Z510 Encounter for antineoplastic radiation therapy: Secondary | ICD-10-CM | POA: Diagnosis not present

## 2016-12-14 NOTE — Progress Notes (Signed)
[place]  [phone] [phone]  Clinic Follow Up Note   Patient Care Team: Robyn Haber, MD as PCP - General (Family Medicine) Michael Boston, MD as Consulting Physician (General Surgery) Jerene Bears, MD as Consulting Physician (Gastroenterology) Truitt Merle, MD as Consulting Physician (Hematology) Kyung Rudd, MD as Consulting Physician (Radiation Oncology) 12/18/2016  CHIEF COMPLAINTS:  Rectal adenocarcinoma  Oncology History   Cancer Staging Rectal adenocarcinoma  Staging form: Colon and Rectum, AJCC 8th Edition - Clinical stage from 10/09/2016: Stage IIIB (cT3, cN1b, cM0) - Signed by Truitt Merle, MD on 10/25/2016       Rectal adenocarcinoma    10/03/2016 - 10/03/2016 Hospital Admission    Admit date: 10/03/16 The patient presented to the [facility] ED with rectal bleeding and difficulty with hygiene.      10/09/2016 Initial Diagnosis    Rectal adenocarcinoma       10/09/2016 Procedure    Colonoscopy showed a fungating, infiltrative and ulcerated partially obstructing large mass in the distal rectum, measuring 9 cm, prolapsing at anal canal. Scope not advanced proximal to the rectosigmoid colon.      10/09/2016 Initial Biopsy    Rectal mass biopsy showed invasive adenocarcinoma.      10/13/2016 Imaging    CT Chest Abdomen Pelvis w contrast IMPRESSION: Large irregular enhancing rectal mass consistent with known rectal cancer. There are small perirectal and small pelvic sidewall lymph nodes which are suspicious. Prominent perirectal vessels are also noted. No evidence of metastatic retroperitoneal or sigmoid mesocolon adenopathy or hepatic or pulmonary metastasis.      10/24/2016 Imaging    MRI Pelvis IMPRESSION: 1. Moderately motion degraded exam. 2. Large mass is centered at the anus with extension into the low rectum. No complicating obstruction. 3. Mild mesorectal adenopathy, suspicious. 4. Right inguinal hernia containing  nonobstructive small bowel.       11/08/2016 -  Chemotherapy    Xeloda 50 mg twice daily, with concurrent radiation, held since 12/03/2016 due to colitis and hospitalization       11/08/2016 -  Radiation Therapy    Neoadjuvant radiation to his rectal cancer      12/03/2016 - 12/09/2016 Hospital Admission    He presented with few days to one week history of diarrhea with 6-7 loose stools daily, along with a fever of 101.5 on the day of admission. Patient was also noted to be hypokalemic, hyponatremic. Hospitalized for further management. Stool studies were sent. C. difficile was negative. GI pathogen panel was positive for Yersinia.  XELODA was held at this time, until infection has cleared.       12/07/2016 Imaging    CT A/P W Contrast  IMPRESSION: 1. Abnormal dilatation of the proximal small bowel loops worrisome for either small bowel obstruction versus small bowel ileus. 2. 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 3. Large right inguinal hernia containing edematous loop of distal small bowel 4. No pneumatosis or bowel perforation.  No abscess. 5. Aortic atherosclerosis 6. Mild decrease in size of rectal mass. No change in right common iliac adenopathy.      HISTORY OF PRESENTING ILLNESS (10/25/16):  David Clarke 61 y.o. male is here because of his recently diagnosed rectal adenocarcinoma. He was referred by his gastroenterologist title parietal. Patient presents to my clinic, accompanied by his wife and his daughter.       He has noticed a anal mass  accompanied by intermittent bleeding and pain in the rectum for the past 5 months. The patient initially presented to the [facility] ED on 10/03/16 with rectal bleeding and difficulty with hygeine. He was without health insurance previously and so did not pursue evaluation or treatment until this time.  The patient was seen by Dr. Hilarie Fredrickson for  a colonoscopy and biopsy on 10/09/16, revealing a large partially obstructing low rectal mass, prolapsing to anal verge. Biopsy showed adenocarcinoma. CT C/A/P on 10/13/16 revealed a large, irregular, enhancing mass consistent with known rectal cancer. There were several suspicious small perirectal and small pelvic sidewall lymph nodes. Prominent perirectal vessels were also noted. MRI of the pelvis on 10/24/16 showed a large mass centered at the anus with extension into the low rectum without obstruction. There was suspicious mild mesorectal adenopathy, and a right inguinal hernia containing nonobstructive small bowel.   The patient comes to the clinic for consultation today accompanied by his partner and daughter. He denies abdominal cramps, nausea, bowel or bladder concerns. He reports taking OTC stool softener as needed. He reports his appetite is good, but he has lost 12-15 lbs in the past few weeks. His daughter notes she has hemochromatosis and questions if this could be playing a role in the patient's anemia.  CURRENT THERAPY: Current radiation and chemotherapy with Xeloda 1500 mg twice daily on the day of radiation, started on 11/08/2016, held from 12/03/2016 due to hospitalization and severe diarrhea   INTERIM HISTORY: David Clarke presents for follow up. He is accompanied by his daughter and a friend today. The patient reports he feels well, other than radiation side effects including skin effects and significant soreness to his scrotum. He finds it difficult to sit comfortably. He describes ongoing diarrhea which is manageable. He questions when he should restart Xeloda.    MEDICAL HISTORY:  Past Medical History:  Diagnosis Date  . Allergy   . Cancer ([place]) 10/09/2016   rectal adenocarcinoma    SURGICAL HISTORY: Past Surgical History:  Procedure Laterality Date  . COLONOSCOPY W/ BIOPSIES Bilateral 10/09/2016   rectal adenocarcinoma  . MOUTH SURGERY     age 58, to remove extra "eye" teeth    . [place] teeth extraction      SOCIAL HISTORY: Social History   Social History  . Marital status: Single    Spouse name: N/A  . Number of children: 1  . Years of education: N/A   Occupational History  . restaurant/caterer    Social History Main Topics  . Smoking status: Former Smoker    Years: 2.00    Types: Cigarettes    Quit date: 2002  . Smokeless tobacco: Never Used  . Alcohol use 6.0 oz/week    4 Glasses of wine, 6 Cans of beer per week     Comment: beer or a glass or wine most days of the week  . Drug use: No  . Sexual activity: Yes    Partners: Female   Other Topics Concern  . Not on file   Social History Narrative  . No narrative on file    FAMILY HISTORY: Family History  Problem Relation Age of Onset  . Other Mother     bile duct tumor  . Stroke Mother   . Cancer Mother 39    bile duct cancer   . Prostate cancer Father 17  . Emphysema Father   . Colon cancer Neg Hx   . Esophageal cancer Neg Hx   . Pancreatic cancer  Neg Hx   . Rectal cancer Neg Hx   . Stomach cancer Neg Hx     ALLERGIES:  is allergic to sulfa antibiotics.  MEDICATIONS:  Current Outpatient Prescriptions  Medication Sig Dispense Refill  . ciprofloxacin (CIPRO) 500 MG tablet Take 1 tablet (500 mg total) by mouth 2 (two) times daily. 20 tablet 0  . fluconazole (DIFLUCAN) 100 MG tablet Take 1.5 tablets (150 mg total) by mouth daily. 1 tablet 0  . Multiple Vitamins-Minerals (MULTIVITAMIN ADULT PO) Take 1 tablet by mouth daily.    . Oral Wound Care Products [facility]) LIQD Use as directed 5 mLs in the mouth or throat every 4 (four) hours as needed. 240 mL 1  . potassium chloride SA (K-DUR,KLOR-CON) 20 MEQ tablet Take 1 tablet (20 mEq total) by mouth 2 (two) times daily. 60 tablet 1  . capecitabine (XELODA) 500 MG tablet Take 3 tablets (1,500 mg total) by mouth 2 (two) times daily after a meal. Refill 60 tabs only. (Patient not taking: Reported on 12/11/2016) 60 tablet 0  . magic  mouthwash w/lidocaine SOLN Take 5 mLs by mouth 4 (four) times daily as needed for mouth pain. Swish and Swallow  Or   Swish and Spit. (Patient not taking: Reported on 12/18/2016) 60 mL 0  . nystatin (MYCOSTATIN) 100000 UNIT/ML suspension Take 5 mLs (500,000 Units total) by mouth 4 (four) times daily. (Patient not taking: Reported on 12/18/2016) 60 mL 0   Current Facility-Administered Medications  Medication Dose Route Frequency Provider Last Rate Last Dose  . 0.9 %  sodium chloride infusion  500 mL Intravenous Continuous Jerene Bears, MD        REVIEW OF SYSTEMS:   Constitutional: Denies fevers, chills or abnormal night sweats (+) fever (+) weight gain of 5 lbs in the last week Eyes: Denies blurriness of vision, double vision or watery eyes Ears, nose, mouth, throat, and face: Denies mucositis or sore throat  Respiratory: Denies cough, dyspnea or wheezes Cardiovascular: Denies palpitation, chest discomfort or lower extremity swelling Gastrointestinal:  Denies nausea, heartburn or change in bowel habits (+) ongoing, manageable diarrhea  Skin: (+) skin irritation and pain to scrotum related to radiation therapy Lymphatics: Denies new lymphadenopathy or easy bruising Neurological: Denies numbness, tingling or new weaknesses Behavioral/Psych: Mood is stable, no new changes  All other systems were reviewed with the patient and are negative.   PHYSICAL EXAMINATION: ECOG PERFORMANCE STATUS: 1 - Symptomatic but completely ambulatory  Vitals:   12/18/16 1140  BP: 100/68  Pulse: 77  Resp: 18  Temp: 97.9 F (36.6 C)   Filed Weights   12/18/16 1140  Weight: 131 lb 14.4 oz (59.8 kg)   GENERAL: alert, no distress and comfortable.  HEENT: thrush is improved  SKIN: skin color, texture, turgor are normal, no rashes or significant lesions NECK: supple, thyroid normal size, non-tender, without nodularity LYMPH: no palpable lymphadenopathy in the cervical, axillary or inguinal LUNGS: clear to  auscultation and percussion with normal breathing effort HEART: regular rate & rhythm and no murmurs and no lower extremity edema ABDOMEN: abdomen soft, non-tender and normal bowel sounds Musculoskeletal: no cyanosis of digits and no clubbing  PSYCH: alert & oriented x 3 with fluent speech NEURO: no focal motor/sensory deficits.  RECTAL: Rectal exam was not performed today.   LABORATORY DATA:  I have reviewed the data as listed CBC Latest Ref Rng & Units 12/18/2016 12/11/2016 12/08/2016  WBC 4.0 - 10.3 10e3/uL 8.6 7.9 4.5  Hemoglobin 13.0 -  17.1 g/dL 10.5(L) 9.9(L) 8.6(L)  Hematocrit 38.4 - 49.9 % 32.2(L) 30.7(L) 26.2(L)  Platelets 140 - 400 10e3/uL 420(H) 423(H) 356   CMP Latest Ref Rng & Units 12/18/2016 12/11/2016 12/09/2016  Glucose 70 - 140 mg/dl 110 134 122(H)  BUN 7.0 - 26.0 mg/dL 10.9 4.4(L) <5(L)  Creatinine 0.7 - 1.3 mg/dL 0.6(L) 0.7 0.59(L)  Sodium 136 - 145 mEq/L 138 133(L) 133(L)  Potassium 3.5 - 5.1 mEq/L 3.3(L) 2.8(LL) 3.4(L)  Chloride 101 - 111 mmol/L - - 107  CO2 22 - 29 mEq/L 28 21(L) 21(L)  Calcium 8.4 - 10.4 mg/dL 8.1(L) 8.0(L) 7.5(L)  Total Protein 6.4 - 8.3 g/dL 4.9(L) 5.1(L) -  Total Bilirubin 0.20 - 1.20 mg/dL 0.25 0.42 -  Alkaline Phos 40 - 150 U/L 99 91 -  AST 5 - 34 U/L 15 17 -  ALT 0 - 55 U/L 23 29 -   CEA ([facility]) 10/27/2016: 7.81 11/27/2016: 5.56  PATHOLOGY:  Diagnosis 10/09/2016 Rectum, biopsy, mass - ADENOCARCINOMA.  RADIOGRAPHIC STUDIES: I have personally reviewed the radiological images as listed and agreed with the findings in the report.  CT A/P W Contrast 12/07/16 IMPRESSION: 1. Abnormal dilatation of the proximal small bowel loops worrisome for either small bowel obstruction versus small bowel ileus. 2. 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 3. Large right inguinal hernia containing edematous loop of distal small bowel 4. No  pneumatosis or bowel perforation.  No abscess. 5. Aortic atherosclerosis 6. Mild decrease in size of rectal mass. No change in right common iliac adenopathy.  DG Chest 2 View 12/03/2016 IMPRESSION: 1. No active cardiopulmonary disease. No evidence of pneumonia or pulmonary edema. No evidence of intrathoracic metastasis. 2. Hyperexpanded lungs suggesting COPD.  MRI Pelvis 10/24/2016 IMPRESSION: 1. Moderately motion degraded exam. 2. Large mass is centered at the anus with extension into the low rectum. No complicating obstruction. 3. Mild mesorectal adenopathy, suspicious. 4. Right inguinal hernia containing nonobstructive small bowel.  CT CAP w Contrast 10/13/2016 IMPRESSION: Large irregular enhancing rectal mass consistent with known rectal cancer. There are small perirectal and small pelvic sidewall lymph nodes which are suspicious. Prominent perirectal vessels are also noted. No evidence of metastatic retroperitoneal or sigmoid mesocolon adenopathy or hepatic or pulmonary metastasis.  COLONOSOCPY 10/09/2016 Dr. Hilarie Fredrickson  A fungating, infiltrative and ulcerated partially obstructing large mass was found in the distal rectum. The mass was circumferential. The mass measured seven cm in length (prolapsing and from dentate line to approx 7 cm). In addition, its inner diameter measured nine mm. Oozing was present. This was biopsied with a cold forceps for histology. - Due to the distal rectal mass the bowel preparation was poor and unable to be cleared via the adult upper endoscope (adult upper endoscope used due to obstructing mass). Scope not advanced proximal to the rectosigmoid colon.  ASSESSMENT & PLAN: 61 y.o.  gentleman, previously healthy, presented with a anal mass and a mild intermittent rectal bleeding for 5-6 months. Fatigue, anorexia, fatigue and weight loss for a month. He is currently on radiation for his rectal cancer, chemotherapy has been held lately.  1) Rectal  Adenocarcinoma, low rectum, [id number], stage IIIB, MSI-pending  -I previously reviewed his CT and MRI scan, colonoscopy and biopsy findings in detail with pt and his family members -Based on his imaging findings, he has clinical stage III disease. We reviewed his image in our GI tumor Board this morning,  he also has a small liver lesion which is indeterminate, likely benign, and we will follow up with a repeated CT in about 3 months.  -We previously discussed the nature history of rectal cancer, high risk of cancer recurrence for locally advanced disease, and the standard therapy for stage III disease, which is neoadjuvant chemotherapy and irradiation, followed by definitive surgery (likely APR per Dr. Johney Maine), and adjuvant chemo -We previously discussed the chemo options with concurrent RT, including Xeloda 824m/m2, which he would take twice a day on days he receives radiation.   -The patient has started concurrent chemoradiation with Xeloda, tolerating well. Unfortunately he developed severe diarrhea from Yersinia, Xeloda has been held, he continued radiation. - He will complete radiation tomorrow Tuesday 4/24. - Discontinue Xeloda, due to his diarrhea, which is improving but not resolved yet  - We discussed the role of adjuvant chemotherapy. Given his stage III B disease, I likely will recommend adjuvant FOLFOX or CAPOX .Today we discussed potential need for port placement; the patient understands he may elect to hold the port for now. He is in agreement with this. -He will see Dr. GJohney Maineback soon to discuss surgery, and possible a restaging scan before surgery   2. Yersinia enterocolitia colitis  -he was hospitalized for a week, now recovering -he will finish cipro today -Continue Imodium as needed  3.  Weight loss and dietary concerns. -I previously referred the patient to speak BErnestene Kielin Nutrition.  -I have encouraged the patient to increase his calorie and protein intake. He will stay  well hydrated. -I previously encouraged the patient to maintain regular exercise as he is able to improve his energy levels. -he lost 10lbs from his recent episode of infectious colitis, though he has since gained 5 lbs back as of 12/18/16.  4. 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 -Hemoglobin was 8.3 initially with low MCV, likely anemia of iron deficiency from chronic blood loss. This was confirmed by his iron study. - Hemoglobin 10.5 today 12/18/16. -His daughter was diagnosed with hemochromatosis (homozygous), his genetic testing showed he is a carrier -He received IV iron once, I'll be cautious about IV iron replacement due to his hemachromatosis carrier status. -previously repeated iron study still showed low iron, his anemia has gotten worse lately, I previously recommend him to take oral iron 1-2 tab a day for a month.  5. Fatigue -Likely the combination of anemia, chemotherapy and radiation. -worse since his infectious colitis. I encouraged him to remain to be physically active.  6. Hypokalemia -K 3.3 today, likely secondary to diarrhea -I previously called in KCL 218m, he will continue to take this as directed until diarrhea resolves.  7. Oral thrush -resolved now   Plan -Continue holding Xeloda. He will finish radiation tomorrow. - Continue potassium supplement as directed until diarrhea subsides. Continue iron supplement. - Use Imodium as needed for diarrhea. - Return to clinic for follow up with labs in 3 weeks. - Refer back to Dr. GrJohney Maineor surgery.  All questions were answered. The patient knows to call the clinic with any problems, questions or concerns.  I spent 20 minutes counseling the patient face to face. The total time spent in the appointment was 25 minutes and more than 50% was  on counseling.  This document serves as a record of services personally performed  by Truitt Merle, MD. It was created on her behalf by Maryla Morrow, a trained medical scribe. The creation of this record is based on the scribe's personal observations and the provider's statements to them. This document has been checked and approved by the attending provider.   Maryla Morrow 12/18/2016

## 2016-12-14 NOTE — Progress Notes (Signed)
S/p morphine 4 mg IM patient reports pain relief. Rating mid to low back pain 5 down from 9 on a scale of 0-10.

## 2016-12-15 ENCOUNTER — Ambulatory Visit: Payer: BLUE CROSS/BLUE SHIELD

## 2016-12-15 ENCOUNTER — Ambulatory Visit
Admission: RE | Admit: 2016-12-15 | Discharge: 2016-12-15 | Disposition: A | Discharge status: Home / Self Care | Payer: BLUE CROSS/BLUE SHIELD | Source: Ambulatory Visit | Attending: Radiation Oncology | Admitting: Radiation Oncology

## 2016-12-15 DIAGNOSIS — Z510 Encounter for antineoplastic radiation therapy: Secondary | ICD-10-CM | POA: Diagnosis not present

## 2016-12-18 ENCOUNTER — Ambulatory Visit
Admission: RE | Admit: 2016-12-18 | Discharge: 2016-12-18 | Disposition: A | Discharge status: Home / Self Care | Payer: BLUE CROSS/BLUE SHIELD | Source: Ambulatory Visit | Attending: Radiation Oncology | Admitting: Radiation Oncology

## 2016-12-18 ENCOUNTER — Other Ambulatory Visit (HOSPITAL_BASED_OUTPATIENT_CLINIC_OR_DEPARTMENT_OTHER): Payer: BLUE CROSS/BLUE SHIELD

## 2016-12-18 ENCOUNTER — Ambulatory Visit: Payer: BLUE CROSS/BLUE SHIELD

## 2016-12-18 ENCOUNTER — Encounter: Payer: Self-pay | Admitting: Hematology

## 2016-12-18 ENCOUNTER — Ambulatory Visit (HOSPITAL_BASED_OUTPATIENT_CLINIC_OR_DEPARTMENT_OTHER): Payer: BLUE CROSS/BLUE SHIELD | Admitting: Hematology

## 2016-12-18 VITALS — BP 100/68 | HR 77 | Temp 97.9°F | Resp 18 | Ht 72.0 in | Wt 131.9 lb

## 2016-12-18 DIAGNOSIS — R5383 Other fatigue: Secondary | ICD-10-CM | POA: Diagnosis not present

## 2016-12-18 DIAGNOSIS — E876 Hypokalemia: Secondary | ICD-10-CM | POA: Diagnosis not present

## 2016-12-18 DIAGNOSIS — Z148 Genetic carrier of other disease: Secondary | ICD-10-CM | POA: Diagnosis not present

## 2016-12-18 DIAGNOSIS — Z510 Encounter for antineoplastic radiation therapy: Secondary | ICD-10-CM | POA: Diagnosis not present

## 2016-12-18 DIAGNOSIS — K922 Gastrointestinal hemorrhage, unspecified: Secondary | ICD-10-CM

## 2016-12-18 DIAGNOSIS — C20 Malignant neoplasm of rectum: Secondary | ICD-10-CM

## 2016-12-18 DIAGNOSIS — R634 Abnormal weight loss: Secondary | ICD-10-CM

## 2016-12-18 DIAGNOSIS — A046 Enteritis due to Yersinia enterocolitica: Secondary | ICD-10-CM | POA: Diagnosis not present

## 2016-12-18 DIAGNOSIS — D500 Iron deficiency anemia secondary to blood loss (chronic): Secondary | ICD-10-CM | POA: Diagnosis not present

## 2016-12-18 DIAGNOSIS — R197 Diarrhea, unspecified: Secondary | ICD-10-CM | POA: Diagnosis not present

## 2016-12-18 LAB — CBC WITH DIFFERENTIAL/PLATELET
BASO%: 0.4 % (ref 0.0–2.0)
Basophils Absolute: 0 10*3/uL (ref 0.0–0.1)
EOS ABS: 0 10*3/uL (ref 0.0–0.5)
EOS%: 0.3 % (ref 0.0–7.0)
HCT: 32.2 % — ABNORMAL LOW (ref 38.4–49.9)
HEMOGLOBIN: 10.5 g/dL — AB (ref 13.0–17.1)
LYMPH%: 2.5 % — AB (ref 14.0–49.0)
MCH: 27.6 pg (ref 27.2–33.4)
MCHC: 32.5 g/dL (ref 32.0–36.0)
MCV: 84.9 fL (ref 79.3–98.0)
MONO#: 0.6 10*3/uL (ref 0.1–0.9)
MONO%: 7 % (ref 0.0–14.0)
NEUT#: 7.7 10*3/uL — ABNORMAL HIGH (ref 1.5–6.5)
NEUT%: 89.8 % — ABNORMAL HIGH (ref 39.0–75.0)
Platelets: 420 10*3/uL — ABNORMAL HIGH (ref 140–400)
RBC: 3.79 10*6/uL — AB (ref 4.20–5.82)
RDW: 34.5 % — AB (ref 11.0–14.6)
WBC: 8.6 10*3/uL (ref 4.0–10.3)
lymph#: 0.2 10*3/uL — ABNORMAL LOW (ref 0.9–3.3)

## 2016-12-18 LAB — COMPREHENSIVE METABOLIC PANEL
ALT: 23 U/L (ref 0–55)
AST: 15 U/L (ref 5–34)
Albumin: 1.8 g/dL — ABNORMAL LOW (ref 3.5–5.0)
Alkaline Phosphatase: 99 U/L (ref 40–150)
Anion Gap: 10 mEq/L (ref 3–11)
BUN: 10.9 mg/dL (ref 7.0–26.0)
CO2: 28 meq/L (ref 22–29)
Calcium: 8.1 mg/dL — ABNORMAL LOW (ref 8.4–10.4)
Chloride: 100 mEq/L (ref 98–109)
Creatinine: 0.6 mg/dL — ABNORMAL LOW (ref 0.7–1.3)
GLUCOSE: 110 mg/dL (ref 70–140)
Potassium: 3.3 mEq/L — ABNORMAL LOW (ref 3.5–5.1)
SODIUM: 138 meq/L (ref 136–145)
TOTAL PROTEIN: 4.9 g/dL — AB (ref 6.4–8.3)
Total Bilirubin: 0.25 mg/dL (ref 0.20–1.20)

## 2016-12-19 ENCOUNTER — Ambulatory Visit
Admission: RE | Admit: 2016-12-19 | Discharge: 2016-12-19 | Disposition: A | Discharge status: Home / Self Care | Payer: BLUE CROSS/BLUE SHIELD | Source: Ambulatory Visit | Attending: Radiation Oncology | Admitting: Radiation Oncology

## 2016-12-19 ENCOUNTER — Ambulatory Visit: Payer: BLUE CROSS/BLUE SHIELD

## 2016-12-19 DIAGNOSIS — Z510 Encounter for antineoplastic radiation therapy: Secondary | ICD-10-CM | POA: Diagnosis not present

## 2016-12-19 NOTE — Progress Notes (Signed)
Nutrition  Patient was a no show for nutrition follow-up appointment today.  Will follow-up as able.  Rutha Melgoza B. Zenia Resides, [place], [place] Registered Dietitian [phone] (pager)

## 2016-12-20 ENCOUNTER — Ambulatory Visit: Payer: BLUE CROSS/BLUE SHIELD

## 2016-12-25 ENCOUNTER — Ambulatory Visit: Payer: Self-pay | Admitting: Surgery

## 2017-01-03 ENCOUNTER — Encounter: Payer: Self-pay | Admitting: Radiation Oncology

## 2017-01-03 NOTE — Progress Notes (Signed)
  Radiation Oncology         [phone]) [phone] ________________________________  Name: David Clarke MRN: [id number]  Date: 12/11/2016  DOB: 12/07/1955  COMPLEX SIMULATION  NOTE  Diagnosis: rectal cancer  Narrative The patient has initially been planned to receive a course of radiation treatment to a dose of 45 gray in 25 fractions at 1.8 gray per fraction using a IMRT technique. The patient will now receive a boost to the high risk target volume for an additional 9 gray. This will be delivered in 3 fractions at 1.8 gray per fraction. To accomplish this, an additional IMRT plan has been generated. Proceeding with another simulation is medically necessary due to the patient's increased irritation in the genital region. He requires repositioning to try to minimize toxicity going forward and therefore he has undergone another CT scan in our department for treatment planning purposes for his boost treatment. The patient's final total dose therefore will be 54 gray.   ________________________________ ------------------------------------------------  Jodelle Gross, MD, PhD

## 2017-01-03 NOTE — Progress Notes (Signed)
  Radiation Oncology         [phone]) [phone] ________________________________  Name: David Clarke MRN: [id number]  Date: 01/03/2017  DOB: Sep 03, 1955  End of Treatment Note  Diagnosis: 61 year old gentleman with adenocarcinoma of the rectum (cT3, cN1, cM0)     Indication for treatment: Curative       Radiation treatment dates:  11/08/16-12/19/16  Site/dose:  1) Pelvis/ 45 Gy in 25 fractions   2) Rectum boost/ 9 Gy in 5 fractions  Beams/energy:  1) IMRT/ 6xFFF    2) IMRT/ 6xFFF  Narrative: The patient tolerated radiation treatment relatively well. During treatment, the patient noted irritation to the posterior groin improved, and improvement of diarrhea. He noted skin irritation and fatigue throughout treatment.  Plan: The patient has completed radiation treatment. The patient will return to radiation oncology clinic for routine followup in one month. I advised them to call or return sooner if they have any questions or concerns related to their recovery or treatment.  ------------------------------------------------  Jodelle Gross, MD, PhD  This document serves as a record of services personally performed by Kyung Rudd, MD. It was created on his behalf by Bethann Humble, a trained medical scribe. The creation of this record is based on the scribe's personal observations and the provider's statements to them. This document has been checked and approved by the attending provider.

## 2017-01-08 ENCOUNTER — Ambulatory Visit: Payer: BLUE CROSS/BLUE SHIELD | Admitting: Hematology

## 2017-01-08 ENCOUNTER — Other Ambulatory Visit: Payer: BLUE CROSS/BLUE SHIELD

## 2017-01-08 NOTE — Progress Notes (Signed)
[place]  [phone] [phone]  Clinic Follow Up Note   Patient Care Team: Robyn Haber, MD as PCP - General (Family Medicine) Michael Boston, MD as Consulting Physician (General Surgery) Pyrtle, Lajuan Lines, MD as Consulting Physician (Gastroenterology) Truitt Merle, MD as Consulting Physician (Hematology) Kyung Rudd, MD as Consulting Physician (Radiation Oncology) 01/10/2017  CHIEF COMPLAINTS:  Rectal adenocarcinoma  Oncology History   Cancer Staging Rectal adenocarcinoma  Staging form: Colon and Rectum, AJCC 8th Edition - Clinical stage from 10/09/2016: Stage IIIB (cT3, cN1b, cM0) - Signed by Truitt Merle, MD on 10/25/2016       Rectal adenocarcinoma    10/03/2016 - 10/03/2016 Hospital Admission    Admit date: 10/03/16 The patient presented to the [facility] ED with rectal bleeding and difficulty with hygiene.      10/09/2016 Initial Diagnosis    Rectal adenocarcinoma       10/09/2016 Procedure    Colonoscopy showed a fungating, infiltrative and ulcerated partially obstructing large mass in the distal rectum, measuring 9 cm, prolapsing at anal canal. Scope not advanced proximal to the rectosigmoid colon.      10/09/2016 Initial Biopsy    Rectal mass biopsy showed invasive adenocarcinoma.      10/13/2016 Imaging    CT Chest Abdomen Pelvis w contrast IMPRESSION: Large irregular enhancing rectal mass consistent with known rectal cancer. There are small perirectal and small pelvic sidewall lymph nodes which are suspicious. Prominent perirectal vessels are also noted. No evidence of metastatic retroperitoneal or sigmoid mesocolon adenopathy or hepatic or pulmonary metastasis.      10/24/2016 Imaging    MRI Pelvis IMPRESSION: 1. Moderately motion degraded exam. 2. Large mass is centered at the anus with extension into the low rectum. No complicating obstruction. 3. Mild mesorectal adenopathy, suspicious. 4. Right inguinal hernia containing  nonobstructive small bowel.       11/08/2016 - 12/19/2016 Chemotherapy    Xeloda 50 mg twice daily, with concurrent radiation, held since 12/03/2016 due to colitis and hospitalization      11/08/2016 - 12/19/2016 Radiation Therapy    Neoadjuvant radiation to his rectal cancer  1) Pelvis/ 45 Gy in 25 fractions  2) Rectum boost/ 9 Gy in 5 fractions Under the care of Dr. Lisbeth Renshaw.      12/03/2016 - 12/09/2016 Hospital Admission    He presented with few days to one week history of diarrhea with 6-7 loose stools daily, along with a fever of 101.5 on the day of admission. Patient was also noted to be hypokalemic, hyponatremic. Hospitalized for further management. Stool studies were sent. C. difficile was negative. GI pathogen panel was positive for Yersinia.  XELODA was held at this time, until infection has cleared.       12/07/2016 Imaging    CT A/P W Contrast  IMPRESSION: 1. Abnormal dilatation of the proximal small bowel loops worrisome for either small bowel obstruction versus small bowel ileus. 2. 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 3. Large right inguinal hernia containing edematous loop of distal small bowel 4. No pneumatosis or bowel perforation.  No abscess. 5. Aortic atherosclerosis 6. Mild decrease in size of rectal mass. No change in right common iliac adenopathy.      HISTORY OF PRESENTING ILLNESS (10/25/16):  [name] 61 y.o. male is here because of his recently diagnosed rectal adenocarcinoma. He was referred by his gastroenterologist title parietal. Patient presents  to my clinic, accompanied by his wife and his daughter.       He has noticed a anal mass accompanied by intermittent bleeding and pain in the rectum for the past 5 months. The patient initially presented to the [facility] ED on 10/03/16 with rectal bleeding and difficulty with hygeine. He was without health  insurance previously and so did not pursue evaluation or treatment until this time.  The patient was seen by Dr. Hilarie Fredrickson for a colonoscopy and biopsy on 10/09/16, revealing a large partially obstructing low rectal mass, prolapsing to anal verge. Biopsy showed adenocarcinoma. CT C/A/P on 10/13/16 revealed a large, irregular, enhancing mass consistent with known rectal cancer. There were several suspicious small perirectal and small pelvic sidewall lymph nodes. Prominent perirectal vessels were also noted. MRI of the pelvis on 10/24/16 showed a large mass centered at the anus with extension into the low rectum without obstruction. There was suspicious mild mesorectal adenopathy, and a right inguinal hernia containing nonobstructive small bowel.   The patient comes to the clinic for consultation today accompanied by his partner and daughter. He denies abdominal cramps, nausea, bowel or bladder concerns. He reports taking OTC stool softener as needed. He reports his appetite is good, but he has lost 12-15 lbs in the past few weeks. His daughter notes she has hemochromatosis and questions if this could be playing a role in the patient's anemia.  CURRENT THERAPY: Current radiation and chemotherapy with Xeloda 1500 mg twice daily on the day of radiation, started on 11/08/2016, held from 12/03/2016 due to hospitalization and severe diarrhea  Radiation ended 12/19/16 -Surgery  INTERIM HISTORY: Kriss Ishler presents for follow up. He presents to the clinic today with his daughter. He reports he is gaining weight and he mentions cramping due to the food he is eating. The last time the cramping was really bad. His BM are regular. No longer have diarrhea. He is eating various of foods. He has healed from radiation completely but he still has skin discoloration. He cut back on the iron pill to every other day. Denies pain and bleeding. He works at [employer] and he was able to finish the shift. He wants to start keep walking a  couple of miles. His daughter reports she does not live with him, but his girlfriend does and says he is more fatigued still. He agrees and he enjoys the naps. He reported he would have a colostomy but not sure If it is long term or short term.   His daughter will be going to [employer] for graduate school. But still close to help if need be. He has a family reunion for labor day.    MEDICAL HISTORY:  Past Medical History:  Diagnosis Date  . Allergy   . Cancer ([place]) 10/09/2016   rectal adenocarcinoma    SURGICAL HISTORY: Past Surgical History:  Procedure Laterality Date  . COLONOSCOPY W/ BIOPSIES Bilateral 10/09/2016   rectal adenocarcinoma  . MOUTH SURGERY     age 78, to remove extra "eye" teeth  . [place] teeth extraction      SOCIAL HISTORY: Social History   Social History  . Marital status: Single    Spouse name: N/A  . Number of children: 1  . Years of education: N/A   Occupational History  . restaurant/caterer    Social History Main Topics  . Smoking status: Former Smoker    Years: 2.00    Types: Cigarettes    Quit date: 2002  . Smokeless  tobacco: Never Used  . Alcohol use 6.0 oz/week    4 Glasses of wine, 6 Cans of beer per week     Comment: beer or a glass or wine most days of the week  . Drug use: No  . Sexual activity: Yes    Partners: Female   Other Topics Concern  . Not on file   Social History Narrative  . No narrative on file    FAMILY HISTORY: Family History  Problem Relation Age of Onset  . Other Mother        bile duct tumor  . Stroke Mother   . Cancer Mother 27       bile duct cancer   . Prostate cancer Father 60  . Emphysema Father   . Colon cancer Neg Hx   . Esophageal cancer Neg Hx   . Pancreatic cancer Neg Hx   . Rectal cancer Neg Hx   . Stomach cancer Neg Hx     ALLERGIES:  is allergic to sulfa antibiotics.  MEDICATIONS:  Current Outpatient Prescriptions  Medication Sig Dispense Refill  . capecitabine (XELODA) 500 MG tablet  Take 3 tablets (1,500 mg total) by mouth 2 (two) times daily after a meal. Refill 60 tabs only. (Patient not taking: Reported on 12/11/2016) 60 tablet 0  . ciprofloxacin (CIPRO) 500 MG tablet Take 1 tablet (500 mg total) by mouth 2 (two) times daily. 20 tablet 0  . fluconazole (DIFLUCAN) 100 MG tablet Take 1.5 tablets (150 mg total) by mouth daily. 1 tablet 0  . magic mouthwash w/lidocaine SOLN Take 5 mLs by mouth 4 (four) times daily as needed for mouth pain. Swish and Swallow  Or   Swish and Spit. (Patient not taking: Reported on 12/18/2016) 60 mL 0  . Multiple Vitamins-Minerals (MULTIVITAMIN ADULT PO) Take 1 tablet by mouth daily.    Marland Kitchen nystatin (MYCOSTATIN) 100000 UNIT/ML suspension Take 5 mLs (500,000 Units total) by mouth 4 (four) times daily. (Patient not taking: Reported on 12/18/2016) 60 mL 0  . Oral Wound Care Products [facility]) LIQD Use as directed 5 mLs in the mouth or throat every 4 (four) hours as needed. 240 mL 1  . potassium chloride SA (K-DUR,KLOR-CON) 20 MEQ tablet Take 1 tablet (20 mEq total) by mouth 2 (two) times daily. 60 tablet 1   Current Facility-Administered Medications  Medication Dose Route Frequency Provider Last Rate Last Dose  . 0.9 %  sodium chloride infusion  500 mL Intravenous Continuous Pyrtle, Lajuan Lines, MD        REVIEW OF SYSTEMS:   Constitutional: Denies fevers, chills or abnormal night sweats (+) fever (+) purposeful weight gain of 5 lbs in the last week (+) fatigued Eyes: Denies blurriness of vision, double vision or watery eyes Ears, nose, mouth, throat, and face: Denies mucositis or sore throat  Respiratory: Denies cough, dyspnea or wheezes Cardiovascular: Denies palpitation, chest discomfort or lower extremity swelling Gastrointestinal:  Denies nausea, heartburn or change in bowel habits (+) ongoing, manageable diarrhea (+) stomach cramping Skin: (+) skin discoloration related to radiation therapy Lymphatics: Denies new lymphadenopathy or easy  bruising Neurological: Denies numbness, tingling or new weaknesses Behavioral/Psych: Mood is stable, no new changes  All other systems were reviewed with the patient and are negative.   PHYSICAL EXAMINATION: ECOG PERFORMANCE STATUS: 1 - Symptomatic but completely ambulatory  Vitals:   01/10/17 0819  BP: 107/70  Pulse: 70  Resp: 18  Temp: 98 F (36.7 C)   Filed Weights  01/10/17 0819  Weight: 131 lb (59.4 kg)     GENERAL: alert, no distress and comfortable.  HEENT: thrush is improved  SKIN: skin color, texture, turgor are normal, no rashes or significant lesions NECK: supple, thyroid normal size, non-tender, without nodularity LYMPH: no palpable lymphadenopathy in the cervical, axillary or inguinal LUNGS: clear to auscultation and percussion with normal breathing effort HEART: regular rate & rhythm and no murmurs and no lower extremity edema ABDOMEN: abdomen soft, non-tender and normal bowel sounds Musculoskeletal: no cyanosis of digits and no clubbing  PSYCH: alert & oriented x 3 with fluent speech NEURO: no focal motor/sensory deficits.  RECTAL: Rectal exam was not performed today. (+) skin redness and pealing all healed and small anal mass is now smaller than before   LABORATORY DATA:  I have reviewed the data as listed CBC Latest Ref Rng & Units 01/10/2017 12/18/2016 12/11/2016  WBC 4.0 - 10.3 10e3/uL 5.2 8.6 7.9  Hemoglobin 13.0 - 17.1 g/dL 10.5(L) 10.5(L) 9.9(L)  Hematocrit 38.4 - 49.9 % 33.6(L) 32.2(L) 30.7(L)  Platelets 140 - 400 10e3/uL 313 420(H) 423(H)   CMP Latest Ref Rng & Units 01/10/2017 12/18/2016 12/11/2016  Glucose 70 - 140 mg/dl 95 110 134  BUN 7.0 - 26.0 mg/dL 9.3 10.9 4.4(L)  Creatinine 0.7 - 1.3 mg/dL 0.7 0.6(L) 0.7  Sodium 136 - 145 mEq/L 140 138 133(L)  Potassium 3.5 - 5.1 mEq/L 4.0 3.3(L) 2.8(LL)  Chloride 101 - 111 mmol/L - - -  CO2 22 - 29 mEq/L 25 28 21(L)  Calcium 8.4 - 10.4 mg/dL 8.7 8.1(L) 8.0(L)  Total Protein 6.4 - 8.3 g/dL 5.9(L) 4.9(L)  5.1(L)  Total Bilirubin 0.20 - 1.20 mg/dL 0.26 0.25 0.42  Alkaline Phos 40 - 150 U/L 90 99 91  AST 5 - 34 U/L 12 15 17   ALT 0 - 55 U/L 13 23 29    CEA ([facility]) 10/27/2016: 7.81 11/27/2016: 5.56 01/10/2017: PENDING  PATHOLOGY:  Diagnosis 10/09/2016 Rectum, biopsy, mass - ADENOCARCINOMA.  RADIOGRAPHIC STUDIES: I have personally reviewed the radiological images as listed and agreed with the findings in the report.  CT A/P W Contrast 12/07/16 IMPRESSION: 1. Abnormal dilatation of the proximal small bowel loops worrisome for either small bowel obstruction versus small bowel ileus. 2. 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 3. Large right inguinal hernia containing edematous loop of distal small bowel 4. No pneumatosis or bowel perforation.  No abscess. 5. Aortic atherosclerosis 6. Mild decrease in size of rectal mass. No change in right common iliac adenopathy.  DG Chest 2 View 12/03/2016 IMPRESSION: 1. No active cardiopulmonary disease. No evidence of pneumonia or pulmonary edema. No evidence of intrathoracic metastasis. 2. Hyperexpanded lungs suggesting COPD.  MRI Pelvis 10/24/2016 IMPRESSION: 1. Moderately motion degraded exam. 2. Large mass is centered at the anus with extension into the low rectum. No complicating obstruction. 3. Mild mesorectal adenopathy, suspicious. 4. Right inguinal hernia containing nonobstructive small bowel.  CT CAP w Contrast 10/13/2016 IMPRESSION: Large irregular enhancing rectal mass consistent with known rectal cancer. There are small perirectal and small pelvic sidewall lymph nodes which are suspicious. Prominent perirectal vessels are also noted. No evidence of metastatic retroperitoneal or sigmoid mesocolon adenopathy or hepatic or pulmonary metastasis.  COLONOSOCPY 10/09/2016 Dr. Hilarie Fredrickson  A fungating, infiltrative and ulcerated partially  obstructing large mass was found in the distal rectum. The mass was circumferential. The mass measured seven cm in  length (prolapsing and from dentate line to approx 7 cm). In addition, its inner diameter measured nine mm. Oozing was present. This was biopsied with a cold forceps for histology. - Due to the distal rectal mass the bowel preparation was poor and unable to be cleared via the adult upper endoscope (adult upper endoscope used due to obstructing mass). Scope not advanced proximal to the rectosigmoid colon.  ASSESSMENT & PLAN: 62 y.o.  gentleman, previously healthy, presented with a anal mass and a mild intermittent rectal bleeding for 5-6 months. Fatigue, anorexia, fatigue and weight loss for a month. He is currently on radiation for his rectal cancer, chemotherapy has been held lately.  1) Rectal Adenocarcinoma, low rectum, [id number], stage IIIB, MSI-pending  -I previously reviewed his CT and MRI scan, colonoscopy and biopsy findings in detail with pt and his family members -Based on his imaging findings, he has clinical stage III disease. We reviewed his image in our GI tumor Board this morning, he also has a small liver lesion which is indeterminate, likely benign, and we will follow up with a repeated CT in about 3 months.  -We previously discussed the nature history of rectal cancer, high risk of cancer recurrence for locally advanced disease, and the standard therapy for stage III disease, which is neoadjuvant chemotherapy and irradiation, followed by definitive surgery (likely APR per Dr. Johney Maine), and adjuvant chemo -We previously discussed the chemo options with concurrent RT, including Xeloda 882m/m2, which he would take twice a day on days he receives radiation.   -The patient has started concurrent chemoradiation with Xeloda, tolerating well. Unfortunately he developed severe diarrhea from Yersinia, Xeloda has been held, he continued radiation. -He has completed radiation, is  recovering well. He has had good clinical response -He is tentatively scheduled her for his rectal surgery on 02/21/2017 - We discussed the role of adjuvant chemotherapy. Given his stage III B disease, I likely will recommend adjuvant FOLFOX or CAPOX .Today we discussed potential need for port placement; the patient understands he may elect to hold the port for now. He is in agreement with this. -He will see Dr. GJohney Maineback soon to discuss surgery, and possible a restaging scan before surgery -We discussed eating well and exercise to prepare for surgery -He is scheduled to see Dr. gross next week, who will decide if he needs a restaging scan before surgery. He will proceed with Surgery 6/27  2. Weight loss and dietary concerns. -I previously referred the patient to speak [facility]rn[employer] [employer]iel[employer].  -I have encouraged the patient to increase his calorie and protein intake. He will stay well hydrated. -I previously encouraged the patient to maintain regular exercise as he is able to improve his energy levels. -he lost 10lbs from his recent episode of infectious colitis, though he has since gained 5 lbs back as of 12/18/16.  3. 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 -Hemoglobin was 8.3 initially with low MCV, likely anemia of iron deficiency from chronic blood loss. This was confirmed by his iron study. - Hemoglobin 10.5 today 12/18/16. -His daughter was diagnosed with hemochromatosis (homozygous), his genetic testing showed he is a carrier -He received IV iron once, I'll be cautious about IV iron replacement due to his hemachromatosis carrier status. -previously repeated iron study still showed low iron, his anemia has gotten worse lately, I  previously recommend him to take oral iron 1-2 tab a day for a month. -Hbg is  lower today but   4. Fatigue -Likely the combination of anemia, chemotherapy and radiation. -worse since his infectious colitis. I previously encouraged him to remain to be physically active.  5. Hypokalemia -resolved    Plan -lab and f/u around 7/18, 3 weeks after surgery    All questions were answered. The patient knows to call the clinic with any problems, questions or concerns.  I spent 20 minutes counseling the patient face to face. The total time spent in the appointment was 25 minutes and more than 50% was on counseling.  This document serves as a record of services personally performed by Truitt Merle, MD. It was created on her behalf by Joslyn Devon, a trained medical scribe. The creation of this record is based on the scribe's personal observations and the provider's statements to them. This document has been checked and approved by the attending provider.    Truitt Merle, MD 01/10/2017

## 2017-01-10 ENCOUNTER — Encounter: Payer: Self-pay | Admitting: Hematology

## 2017-01-10 ENCOUNTER — Ambulatory Visit (HOSPITAL_BASED_OUTPATIENT_CLINIC_OR_DEPARTMENT_OTHER): Payer: BLUE CROSS/BLUE SHIELD | Admitting: Hematology

## 2017-01-10 ENCOUNTER — Other Ambulatory Visit (HOSPITAL_BASED_OUTPATIENT_CLINIC_OR_DEPARTMENT_OTHER): Payer: BLUE CROSS/BLUE SHIELD

## 2017-01-10 VITALS — BP 107/70 | HR 70 | Temp 98.0°F | Resp 18 | Ht 72.0 in | Wt 131.0 lb

## 2017-01-10 DIAGNOSIS — C20 Malignant neoplasm of rectum: Secondary | ICD-10-CM

## 2017-01-10 DIAGNOSIS — Z148 Genetic carrier of other disease: Secondary | ICD-10-CM | POA: Diagnosis not present

## 2017-01-10 DIAGNOSIS — D500 Iron deficiency anemia secondary to blood loss (chronic): Secondary | ICD-10-CM

## 2017-01-10 DIAGNOSIS — R5383 Other fatigue: Secondary | ICD-10-CM

## 2017-01-10 DIAGNOSIS — R634 Abnormal weight loss: Secondary | ICD-10-CM | POA: Diagnosis not present

## 2017-01-10 LAB — CBC WITH DIFFERENTIAL/PLATELET
BASO%: 0.4 % (ref 0.0–2.0)
Basophils Absolute: 0 10*3/uL (ref 0.0–0.1)
EOS ABS: 0 10*3/uL (ref 0.0–0.5)
EOS%: 0.8 % (ref 0.0–7.0)
HCT: 33.6 % — ABNORMAL LOW (ref 38.4–49.9)
HGB: 10.5 g/dL — ABNORMAL LOW (ref 13.0–17.1)
LYMPH%: 24.7 % (ref 14.0–49.0)
MCH: 28.8 pg (ref 27.2–33.4)
MCHC: 31.3 g/dL — AB (ref 32.0–36.0)
MCV: 92.1 fL (ref 79.3–98.0)
MONO#: 0.5 10*3/uL (ref 0.1–0.9)
MONO%: 9.8 % (ref 0.0–14.0)
NEUT%: 64.3 % (ref 39.0–75.0)
NEUTROS ABS: 3.4 10*3/uL (ref 1.5–6.5)
PLATELETS: 313 10*3/uL (ref 140–400)
RBC: 3.65 10*6/uL — AB (ref 4.20–5.82)
RDW: 25.4 % — ABNORMAL HIGH (ref 11.0–14.6)
WBC: 5.2 10*3/uL (ref 4.0–10.3)
lymph#: 1.3 10*3/uL (ref 0.9–3.3)
nRBC: 0 % (ref 0–0)

## 2017-01-10 LAB — IRON AND TIBC
%SAT: 10 % — ABNORMAL LOW (ref 20–55)
IRON: 29 ug/dL — AB (ref 42–163)
TIBC: 300 ug/dL (ref 202–409)
UIBC: 271 ug/dL (ref 117–376)

## 2017-01-10 LAB — COMPREHENSIVE METABOLIC PANEL
ALK PHOS: 90 U/L (ref 40–150)
ALT: 13 U/L (ref 0–55)
ANION GAP: 10 meq/L (ref 3–11)
AST: 12 U/L (ref 5–34)
Albumin: 2.6 g/dL — ABNORMAL LOW (ref 3.5–5.0)
BUN: 9.3 mg/dL (ref 7.0–26.0)
CHLORIDE: 105 meq/L (ref 98–109)
CO2: 25 meq/L (ref 22–29)
Calcium: 8.7 mg/dL (ref 8.4–10.4)
Creatinine: 0.7 mg/dL (ref 0.7–1.3)
Glucose: 95 mg/dl (ref 70–140)
Potassium: 4 mEq/L (ref 3.5–5.1)
Sodium: 140 mEq/L (ref 136–145)
Total Bilirubin: 0.26 mg/dL (ref 0.20–1.20)
Total Protein: 5.9 g/dL — ABNORMAL LOW (ref 6.4–8.3)

## 2017-01-10 LAB — CEA (IN HOUSE-CHCC): CEA (CHCC-IN HOUSE): 5.61 ng/mL — AB (ref 0.00–5.00)

## 2017-01-10 LAB — FERRITIN: Ferritin: 28 ng/ml (ref 22–316)

## 2017-01-15 ENCOUNTER — Ambulatory Visit: Payer: Self-pay | Admitting: Surgery

## 2017-01-15 ENCOUNTER — Telehealth: Payer: Self-pay | Admitting: *Deleted

## 2017-01-15 NOTE — Telephone Encounter (Signed)
Called pt per Dr Ernestina Penna instructions & informed of mild low iron level & recommended to take oral iron for another month. Pt reports that he has not been very compliant but will restart iron.

## 2017-01-15 NOTE — Telephone Encounter (Signed)
-----   Message from Truitt Merle, MD sent at 01/14/2017 10:30 PM EDT ----- Please let pt know the lab results, iron level still mildly low, I recommend him to take oral iron for one more month, Thanks.   Truitt Merle  01/14/2017

## 2017-01-16 ENCOUNTER — Other Ambulatory Visit: Payer: Self-pay | Admitting: Surgery

## 2017-01-16 DIAGNOSIS — C20 Malignant neoplasm of rectum: Secondary | ICD-10-CM

## 2017-01-17 ENCOUNTER — Telehealth: Payer: Self-pay | Admitting: Hematology

## 2017-01-17 NOTE — Telephone Encounter (Signed)
Patient bypassed scheduling on 01/10/17.  Left message on patient's voice mail. Appointment letter and schedule mailed to patient, per 01/10/17 los.

## 2017-01-29 ENCOUNTER — Ambulatory Visit
Admission: RE | Admit: 2017-01-29 | Discharge: 2017-01-29 | Disposition: A | Discharge status: Home / Self Care | Payer: BLUE CROSS/BLUE SHIELD | Source: Ambulatory Visit | Attending: Surgery | Admitting: Surgery

## 2017-01-29 DIAGNOSIS — C20 Malignant neoplasm of rectum: Secondary | ICD-10-CM

## 2017-01-29 MED ORDER — GADOBENATE DIMEGLUMINE 529 MG/ML IV SOLN
12.0000 mL | Freq: Once | INTRAVENOUS | Status: AC | PRN
  Administered 2017-01-29: 12 mL via INTRAVENOUS

## 2017-02-14 ENCOUNTER — Telehealth: Payer: Self-pay

## 2017-02-14 NOTE — Telephone Encounter (Signed)
Pt called requesting okay for massage approval. Dr. Burr Medico out of office, notified Dr. Benay Spice via desk nurse and obtained approval. Will send approval letter to massage therapist per pt request.  [facility] [place]: [phone] Spoke with [facility] Fax number [phone]

## 2017-02-16 NOTE — Patient Instructions (Signed)
David Clarke  02/16/2017   Your procedure is scheduled on: 02-21-17  Report to [facility] Main  Entrance Take [place]  elevators to 3rd floor to  [place] at 6:25 AM.   Call this number if you have problems the morning of surgery [phone]    Remember: ONLY 1 PERSON MAY GO WITH YOU TO SHORT STAY TO GET  READY MORNING OF [place].  Do not eat food or drink liquids :After Midnight.               Please have a Clear Liquid Diet the Day of Prep to prevent Dehydration.     CLEAR LIQUID DIET   Foods Allowed                                                                     Foods Excluded  Coffee and tea, regular and decaf                             liquids that you cannot  Plain Jell-O in any flavor                                             see through such as: Fruit ices (not with fruit pulp)                                     milk, soups, orange juice  Iced Popsicles                                    All solid food Carbonated beverages, regular and diet                                    Cranberry, grape and apple juices Sports drinks like Gatorade Lightly seasoned clear broth or consume(fat free) Sugar, honey syrup  Sample Menu Breakfast                                Lunch                                     Supper Cranberry juice                    Beef broth                            Chicken broth Jell-O  Grape juice                           Apple juice Coffee or tea                        Jell-O                                      Popsicle                                                Coffee or tea                        Coffee or tea  _____________________________________________________________________    Take these medicines the morning of surgery with A SIP OF WATER: None:                                 You may not have any metal on your body including hair pins and   piercings  Do not wear jewelry, make-up, lotions, powders or perfumes, deodorant             Men may shave face and neck.   Do not bring valuables to the hospital. [place].  Contacts, dentures or bridgework may not be worn into surgery.  Leave suitcase in the car. After surgery it may be brought to your room.                  Please read over the following fact sheets you were given: _____________________________________________________________________             [facility] - Preparing for Surgery Before surgery, you can play an important role.  Because skin is not sterile, your skin needs to be as free of germs as possible.  You can reduce the number of germs on your skin by washing with CHG (chlorahexidine gluconate) soap before surgery.  CHG is an antiseptic cleaner which kills germs and bonds with the skin to continue killing germs even after washing. Please DO NOT use if you have an allergy to CHG or antibacterial soaps.  If your skin becomes reddened/irritated stop using the CHG and inform your nurse when you arrive at Short Stay. Do not shave (including legs and underarms) for at least 48 hours prior to the first CHG shower.  You may shave your face/neck. Please follow these instructions carefully:  1.  Shower with CHG Soap the night before surgery and the  morning of Surgery.  2.  If you choose to wash your hair, wash your hair first as usual with your  normal  shampoo.  3.  After you shampoo, rinse your hair and body thoroughly to remove the  shampoo.                           4.  Use CHG as you would any other liquid soap.  You can apply chg directly  to the skin  and wash                       Gently with a scrungie or clean washcloth.  5.  Apply the CHG Soap to your body ONLY FROM THE NECK DOWN.   Do not use on face/ open                           Wound or open sores. Avoid contact with eyes, ears mouth and genitals (private  parts).                       Wash face,  Genitals (private parts) with your normal soap.             6.  Wash thoroughly, paying special attention to the area where your surgery  will be performed.  7.  Thoroughly rinse your body with warm water from the neck down.  8.  DO NOT shower/wash with your normal soap after using and rinsing off  the CHG Soap.                9.  Pat yourself dry with a clean towel.            10.  Wear clean pajamas.            11.  Place clean sheets on your bed the night of your first shower and do not  sleep with pets. Day of Surgery : Do not apply any lotions/deodorants the morning of surgery.  Please wear clean clothes to the hospital/surgery center.  FAILURE TO FOLLOW THESE INSTRUCTIONS MAY RESULT IN THE CANCELLATION OF YOUR SURGERY PATIENT SIGNATURE_________________________________  NURSE SIGNATURE__________________________________  ________________________________________________________________________

## 2017-02-16 NOTE — Progress Notes (Signed)
12-04-16 (EPIC) EKG 12-03-16 (EPIC) CXR

## 2017-02-19 ENCOUNTER — Encounter (HOSPITAL_COMMUNITY)
Admission: RE | Admit: 2017-02-19 | Discharge: 2017-02-19 | Disposition: A | Discharge status: Home / Self Care | Payer: BLUE CROSS/BLUE SHIELD | Source: Ambulatory Visit | Attending: Surgery | Admitting: Surgery

## 2017-02-19 ENCOUNTER — Encounter (HOSPITAL_COMMUNITY): Payer: Self-pay

## 2017-02-19 DIAGNOSIS — Z01818 Encounter for other preprocedural examination: Secondary | ICD-10-CM

## 2017-02-19 DIAGNOSIS — C20 Malignant neoplasm of rectum: Secondary | ICD-10-CM | POA: Insufficient documentation

## 2017-02-19 LAB — CBC
HCT: 33.1 % — ABNORMAL LOW (ref 39.0–52.0)
Hemoglobin: 10.3 g/dL — ABNORMAL LOW (ref 13.0–17.0)
MCH: 27.7 pg (ref 26.0–34.0)
MCHC: 31.1 g/dL (ref 30.0–36.0)
MCV: 89 fL (ref 78.0–100.0)
PLATELETS: 369 10*3/uL (ref 150–400)
RBC: 3.72 MIL/uL — AB (ref 4.22–5.81)
RDW: 14.8 % (ref 11.5–15.5)
WBC: 5.4 10*3/uL (ref 4.0–10.5)

## 2017-02-19 LAB — BASIC METABOLIC PANEL
Anion gap: 8 (ref 5–15)
BUN: 14 mg/dL (ref 6–20)
CALCIUM: 8.7 mg/dL — AB (ref 8.9–10.3)
CO2: 24 mmol/L (ref 22–32)
CREATININE: 0.59 mg/dL — AB (ref 0.61–1.24)
Chloride: 105 mmol/L (ref 101–111)
Glucose, Bld: 98 mg/dL (ref 65–99)
Potassium: 3.9 mmol/L (ref 3.5–5.1)
SODIUM: 137 mmol/L (ref 135–145)

## 2017-02-19 LAB — ABO/RH: ABO/RH(D): O POS

## 2017-02-19 NOTE — Consult Note (Signed)
[place] Nurse ostomy consult note  [place] Nurse requested for preoperative stoma site marking by Dr. Johney Maine. He is to have a robotic assisted APR with colostomy. He has had both radiation therapy and oral chemotherapy since his diagnosis in February of this year. He wishes to inform me that his 62 year old daughter (who has some serious chronic health conditions) and his girlfriend (who is under a lot of stress at the moment) will be involved peripherally in his care.  Discussed surgical procedure and stoma creation with patient.  Explained role of the [place] nurse team.  Answered patient questions.   Examined patient lying, sitting, and standing in order to place the marking in the patient's visual field, away from any creases or abdominal contour issues and within the rectus muscle.    Marked for colostomy in the LLQ  5cm to the left of the umbilicus and 3cm below the umbilicus.  Patient's abdomen cleansed with CHG wipes at site markings, allowed to air dry prior to marking.Covered mark with thin film transparent dressing to preserve mark until date of surgery (Wednesday, June 27th).   [place] nursing team will follow, and will remain available to this patient, the nursing, surgical and medical teams. Thanks, Maudie Flakes, MSN, RN, [place], Arther Abbott  Pager# [phone]

## 2017-02-19 NOTE — Anesthesia Preprocedure Evaluation (Addendum)
Anesthesia Evaluation  Patient identified by MRN, date of birth, ID band Patient awake    Reviewed: Allergy & Precautions, H&P , Patient's Chart, lab work & pertinent test results, reviewed documented beta blocker date and time   Airway Mallampati: II  TM Distance: >3 FB Neck ROM: full    Dental no notable dental hx.    Pulmonary former smoker,    Pulmonary exam normal breath sounds clear to auscultation       Cardiovascular  Rhythm:regular Rate:Normal     Neuro/Psych    GI/Hepatic hiatal hernia,   Endo/Other    Renal/GU      Musculoskeletal   Abdominal   Peds  Hematology  (+) anemia ,   Anesthesia Other Findings   Reproductive/Obstetrics                            Anesthesia Physical Anesthesia Plan  ASA: II  Anesthesia Plan: General   Post-op Pain Management:    Induction: Intravenous  PONV Risk Score and Plan:   Airway Management Planned: Oral ETT  Additional Equipment: Arterial line  Intra-op Plan: Utilization Of Total Body Hypothermia per surgeon request  Post-operative Plan: Extubation in OR  Informed Consent: I have reviewed the patients History and Physical, chart, labs and discussed the procedure including the risks, benefits and alternatives for the proposed anesthesia with the patient or authorized representative who has indicated his/her understanding and acceptance.   Dental Advisory Given  Plan Discussed with: CRNA and Surgeon  Anesthesia Plan Comments: (  )       Anesthesia Quick Evaluation

## 2017-02-20 LAB — HEMOGLOBIN A1C
HEMOGLOBIN A1C: 5 % (ref 4.8–5.6)
Mean Plasma Glucose: 97 mg/dL

## 2017-02-20 MED ORDER — SODIUM CHLORIDE 0.9 % IV SOLN
INTRAVENOUS | Status: DC
  Filled 2017-02-20: qty 6

## 2017-02-21 ENCOUNTER — Encounter (HOSPITAL_COMMUNITY)
Admission: RE | Disposition: A | Discharge status: Home Health | Payer: Self-pay | Source: Ambulatory Visit | Attending: Surgery

## 2017-02-21 ENCOUNTER — Encounter (HOSPITAL_COMMUNITY): Payer: Self-pay | Admitting: *Deleted

## 2017-02-21 ENCOUNTER — Inpatient Hospital Stay (HOSPITAL_COMMUNITY)
Admission: RE | Admit: 2017-02-21 | Discharge: 2017-02-25 | DRG: 331 | Disposition: A | Discharge status: Home Health | Payer: BLUE CROSS/BLUE SHIELD | Source: Ambulatory Visit | Attending: Surgery | Admitting: Surgery

## 2017-02-21 ENCOUNTER — Encounter: Payer: Self-pay | Admitting: Internal Medicine

## 2017-02-21 ENCOUNTER — Inpatient Hospital Stay (HOSPITAL_COMMUNITY): Payer: BLUE CROSS/BLUE SHIELD | Admitting: Anesthesiology

## 2017-02-21 DIAGNOSIS — Z9221 Personal history of antineoplastic chemotherapy: Secondary | ICD-10-CM

## 2017-02-21 DIAGNOSIS — R591 Generalized enlarged lymph nodes: Secondary | ICD-10-CM | POA: Diagnosis present

## 2017-02-21 DIAGNOSIS — K6389 Other specified diseases of intestine: Secondary | ICD-10-CM | POA: Diagnosis present

## 2017-02-21 DIAGNOSIS — Z87891 Personal history of nicotine dependence: Secondary | ICD-10-CM | POA: Diagnosis not present

## 2017-02-21 DIAGNOSIS — Z8349 Family history of other endocrine, nutritional and metabolic diseases: Secondary | ICD-10-CM

## 2017-02-21 DIAGNOSIS — Z882 Allergy status to sulfonamides status: Secondary | ICD-10-CM

## 2017-02-21 DIAGNOSIS — C20 Malignant neoplasm of rectum: Secondary | ICD-10-CM | POA: Diagnosis present

## 2017-02-21 DIAGNOSIS — R32 Unspecified urinary incontinence: Secondary | ICD-10-CM | POA: Diagnosis present

## 2017-02-21 DIAGNOSIS — Z923 Personal history of irradiation: Secondary | ICD-10-CM | POA: Diagnosis not present

## 2017-02-21 DIAGNOSIS — Z933 Colostomy status: Secondary | ICD-10-CM

## 2017-02-21 DIAGNOSIS — C19 Malignant neoplasm of rectosigmoid junction: Principal | ICD-10-CM | POA: Diagnosis present

## 2017-02-21 DIAGNOSIS — D500 Iron deficiency anemia secondary to blood loss (chronic): Secondary | ICD-10-CM | POA: Diagnosis present

## 2017-02-21 HISTORY — PX: XI ROBOT ABDOMINAL PERINEAL RESECTION: SHX6709

## 2017-02-21 LAB — TYPE AND SCREEN
ABO/RH(D): O POS
Antibody Screen: NEGATIVE

## 2017-02-21 SURGERY — RESECTION, ABDOMINOPERINEAL, ROBOT-ASSISTED
Anesthesia: General | Site: Abdomen

## 2017-02-21 MED ORDER — ROCURONIUM BROMIDE 50 MG/5ML IV SOSY
PREFILLED_SYRINGE | INTRAVENOUS | Status: AC
  Filled 2017-02-21: qty 5

## 2017-02-21 MED ORDER — CELECOXIB 200 MG PO CAPS
400.0000 mg | ORAL_CAPSULE | ORAL | Status: AC
  Administered 2017-02-21: 400 mg via ORAL
  Filled 2017-02-21: qty 2

## 2017-02-21 MED ORDER — ONDANSETRON HCL 40 MG/20ML IJ SOLN
8.0000 mg | Freq: Four times a day (QID) | INTRAMUSCULAR | Status: DC | PRN
  Filled 2017-02-21: qty 4

## 2017-02-21 MED ORDER — PROMETHAZINE HCL 25 MG/ML IJ SOLN
6.2500 mg | Freq: Four times a day (QID) | INTRAMUSCULAR | Status: DC | PRN
  Administered 2017-02-21: 6.25 mg via INTRAVENOUS
  Filled 2017-02-21: qty 1

## 2017-02-21 MED ORDER — HYDROCORTISONE 1 % EX CREA
1.0000 "application " | TOPICAL_CREAM | Freq: Three times a day (TID) | CUTANEOUS | Status: DC | PRN
  Filled 2017-02-21: qty 28

## 2017-02-21 MED ORDER — LIDOCAINE 2% (20 MG/ML) 5 ML SYRINGE
INTRAMUSCULAR | Status: AC
  Filled 2017-02-21: qty 5

## 2017-02-21 MED ORDER — ENOXAPARIN SODIUM 40 MG/0.4ML ~~LOC~~ SOLN
40.0000 mg | Freq: Once | SUBCUTANEOUS | Status: AC
  Administered 2017-02-21: 40 mg via SUBCUTANEOUS
  Filled 2017-02-21: qty 0.4

## 2017-02-21 MED ORDER — ALVIMOPAN 12 MG PO CAPS
12.0000 mg | ORAL_CAPSULE | Freq: Two times a day (BID) | ORAL | Status: DC
  Administered 2017-02-22 (×2): 12 mg via ORAL
  Filled 2017-02-21 (×2): qty 1

## 2017-02-21 MED ORDER — PHENOL 1.4 % MT LIQD: 1.0000 | OROMUCOSAL | Status: DC | PRN

## 2017-02-21 MED ORDER — ZOLPIDEM TARTRATE 5 MG PO TABS: 5.0000 mg | ORAL_TABLET | Freq: Every evening | ORAL | Status: DC | PRN

## 2017-02-21 MED ORDER — DEXTROSE 5 % IV SOLN
2.0000 g | Freq: Two times a day (BID) | INTRAVENOUS | Status: AC
  Administered 2017-02-21: 2 g via INTRAVENOUS
  Filled 2017-02-21: qty 2

## 2017-02-21 MED ORDER — 0.9 % SODIUM CHLORIDE (POUR BTL) OPTIME
TOPICAL | Status: DC | PRN
  Administered 2017-02-21: 2000 mL

## 2017-02-21 MED ORDER — LIDOCAINE 2% (20 MG/ML) 5 ML SYRINGE
INTRAMUSCULAR | Status: DC | PRN
  Administered 2017-02-21: 1.5 mg/kg/h via INTRAVENOUS

## 2017-02-21 MED ORDER — CHLORHEXIDINE GLUCONATE 4 % EX LIQD: 60.0000 mL | Freq: Once | CUTANEOUS | Status: DC

## 2017-02-21 MED ORDER — LACTATED RINGERS IV SOLN
INTRAVENOUS | Status: DC
  Administered 2017-02-21: 07:00:00 via INTRAVENOUS

## 2017-02-21 MED ORDER — HYDROMORPHONE HCL 1 MG/ML IJ SOLN
0.5000 mg | INTRAMUSCULAR | Status: DC | PRN
  Administered 2017-02-21: 2 mg via INTRAVENOUS
  Administered 2017-02-21 – 2017-02-22 (×2): 1 mg via INTRAVENOUS
  Administered 2017-02-22: 2 mg via INTRAVENOUS
  Administered 2017-02-23: 1 mg via INTRAVENOUS
  Filled 2017-02-21: qty 2
  Filled 2017-02-21 (×3): qty 1
  Filled 2017-02-21: qty 2

## 2017-02-21 MED ORDER — LACTATED RINGERS IV BOLUS (SEPSIS): 1000.0000 mL | Freq: Three times a day (TID) | INTRAVENOUS | Status: DC | PRN

## 2017-02-21 MED ORDER — METOPROLOL TARTRATE 5 MG/5ML IV SOLN: 5.0000 mg | Freq: Four times a day (QID) | INTRAVENOUS | Status: DC | PRN

## 2017-02-21 MED ORDER — ONDANSETRON HCL 4 MG/2ML IJ SOLN
INTRAMUSCULAR | Status: DC | PRN
  Administered 2017-02-21: 4 mg via INTRAVENOUS

## 2017-02-21 MED ORDER — MIDAZOLAM HCL 5 MG/5ML IJ SOLN
INTRAMUSCULAR | Status: DC | PRN
  Administered 2017-02-21: 2 mg via INTRAVENOUS

## 2017-02-21 MED ORDER — DIPHENHYDRAMINE HCL 12.5 MG/5ML PO ELIX: 12.5000 mg | ORAL_SOLUTION | Freq: Four times a day (QID) | ORAL | Status: DC | PRN

## 2017-02-21 MED ORDER — BUPIVACAINE-EPINEPHRINE (PF) 0.25% -1:200000 IJ SOLN
INTRAMUSCULAR | Status: AC
Start: 2017-02-21 — End: 2017-02-21
  Filled 2017-02-21: qty 30

## 2017-02-21 MED ORDER — SUGAMMADEX SODIUM 200 MG/2ML IV SOLN
INTRAVENOUS | Status: AC
  Filled 2017-02-21: qty 2

## 2017-02-21 MED ORDER — DEXAMETHASONE SODIUM PHOSPHATE 10 MG/ML IJ SOLN
INTRAMUSCULAR | Status: AC
  Filled 2017-02-21: qty 1

## 2017-02-21 MED ORDER — LIP MEDEX EX OINT
1.0000 "application " | TOPICAL_OINTMENT | Freq: Two times a day (BID) | CUTANEOUS | Status: DC
  Administered 2017-02-22 – 2017-02-24 (×4): 1 via TOPICAL
  Filled 2017-02-21 (×2): qty 7

## 2017-02-21 MED ORDER — BUPIVACAINE-EPINEPHRINE 0.25% -1:200000 IJ SOLN
INTRAMUSCULAR | Status: DC | PRN
  Administered 2017-02-21: 30 mL

## 2017-02-21 MED ORDER — PROPOFOL 10 MG/ML IV BOLUS
INTRAVENOUS | Status: AC
  Filled 2017-02-21: qty 20

## 2017-02-21 MED ORDER — DEXAMETHASONE SODIUM PHOSPHATE 10 MG/ML IJ SOLN
INTRAMUSCULAR | Status: DC | PRN
  Administered 2017-02-21: 10 mg via INTRAVENOUS

## 2017-02-21 MED ORDER — ALUM & MAG HYDROXIDE-SIMETH 200-200-20 MG/5ML PO SUSP: 30.0000 mL | Freq: Four times a day (QID) | ORAL | Status: DC | PRN

## 2017-02-21 MED ORDER — FENTANYL CITRATE (PF) 100 MCG/2ML IJ SOLN
INTRAMUSCULAR | Status: AC
  Filled 2017-02-21: qty 2

## 2017-02-21 MED ORDER — DIPHENHYDRAMINE HCL 50 MG/ML IJ SOLN: 12.5000 mg | Freq: Four times a day (QID) | INTRAMUSCULAR | Status: DC | PRN

## 2017-02-21 MED ORDER — KETAMINE HCL 10 MG/ML IJ SOLN
INTRAMUSCULAR | Status: DC | PRN
  Administered 2017-02-21 (×3): 10 mg via INTRAVENOUS

## 2017-02-21 MED ORDER — ONDANSETRON HCL 4 MG PO TABS: 4.0000 mg | ORAL_TABLET | Freq: Four times a day (QID) | ORAL | Status: DC | PRN

## 2017-02-21 MED ORDER — ROCURONIUM BROMIDE 100 MG/10ML IV SOLN
INTRAVENOUS | Status: DC | PRN
  Administered 2017-02-21: 50 mg via INTRAVENOUS
  Administered 2017-02-21 (×5): 20 mg via INTRAVENOUS

## 2017-02-21 MED ORDER — BUPIVACAINE LIPOSOME 1.3 % IJ SUSP
20.0000 mL | Freq: Once | INTRAMUSCULAR | Status: AC
  Administered 2017-02-21: 20 mL
  Filled 2017-02-21 (×2): qty 20

## 2017-02-21 MED ORDER — SODIUM CHLORIDE 0.9 % IV SOLN
INTRAVENOUS | Status: DC
  Administered 2017-02-21 – 2017-02-22 (×2): via INTRAVENOUS

## 2017-02-21 MED ORDER — PHENYLEPHRINE 40 MCG/ML (10ML) SYRINGE FOR IV PUSH (FOR BLOOD PRESSURE SUPPORT)
PREFILLED_SYRINGE | INTRAVENOUS | Status: AC
  Filled 2017-02-21: qty 10

## 2017-02-21 MED ORDER — KETAMINE HCL 10 MG/ML IJ SOLN
INTRAMUSCULAR | Status: AC
  Filled 2017-02-21: qty 1

## 2017-02-21 MED ORDER — SUGAMMADEX SODIUM 200 MG/2ML IV SOLN
INTRAVENOUS | Status: DC | PRN
  Administered 2017-02-21: 150 mg via INTRAVENOUS

## 2017-02-21 MED ORDER — PHENYLEPHRINE HCL 10 MG/ML IJ SOLN
INTRAMUSCULAR | Status: DC | PRN
  Administered 2017-02-21: 80 ug via INTRAVENOUS

## 2017-02-21 MED ORDER — ONDANSETRON HCL 4 MG/2ML IJ SOLN
4.0000 mg | Freq: Four times a day (QID) | INTRAMUSCULAR | Status: DC | PRN
  Administered 2017-02-21: 4 mg via INTRAVENOUS
  Filled 2017-02-21: qty 2

## 2017-02-21 MED ORDER — MAGIC MOUTHWASH
15.0000 mL | Freq: Four times a day (QID) | ORAL | Status: DC | PRN
  Filled 2017-02-21: qty 15

## 2017-02-21 MED ORDER — BUPIVACAINE LIPOSOME 1.3 % IJ SUSP
20.0000 mL | INTRAMUSCULAR | Status: DC
  Filled 2017-02-21: qty 20

## 2017-02-21 MED ORDER — HYDROCORTISONE 2.5 % RE CREA
1.0000 "application " | TOPICAL_CREAM | Freq: Four times a day (QID) | RECTAL | Status: DC | PRN
  Filled 2017-02-21: qty 28.35

## 2017-02-21 MED ORDER — FENTANYL CITRATE (PF) 250 MCG/5ML IJ SOLN
INTRAMUSCULAR | Status: AC
  Filled 2017-02-21: qty 5

## 2017-02-21 MED ORDER — ACETAMINOPHEN 500 MG PO TABS
1000.0000 mg | ORAL_TABLET | ORAL | Status: AC
  Administered 2017-02-21: 1000 mg via ORAL
  Filled 2017-02-21: qty 2

## 2017-02-21 MED ORDER — LIDOCAINE 2% (20 MG/ML) 5 ML SYRINGE
INTRAMUSCULAR | Status: AC
  Filled 2017-02-21: qty 10

## 2017-02-21 MED ORDER — GABAPENTIN 300 MG PO CAPS
300.0000 mg | ORAL_CAPSULE | ORAL | Status: AC
  Administered 2017-02-21: 300 mg via ORAL
  Filled 2017-02-21: qty 1

## 2017-02-21 MED ORDER — BUPIVACAINE LIPOSOME 1.3 % IJ SUSP
INTRAMUSCULAR | Status: DC | PRN
  Administered 2017-02-21: 20 mL

## 2017-02-21 MED ORDER — SACCHAROMYCES BOULARDII 250 MG PO CAPS
250.0000 mg | ORAL_CAPSULE | Freq: Two times a day (BID) | ORAL | Status: DC
  Administered 2017-02-21 – 2017-02-25 (×8): 250 mg via ORAL
  Filled 2017-02-21 (×8): qty 1

## 2017-02-21 MED ORDER — MIDAZOLAM HCL 2 MG/2ML IJ SOLN
INTRAMUSCULAR | Status: AC
  Filled 2017-02-21: qty 2

## 2017-02-21 MED ORDER — ALVIMOPAN 12 MG PO CAPS
12.0000 mg | ORAL_CAPSULE | Freq: Once | ORAL | Status: AC
  Administered 2017-02-21: 12 mg via ORAL
  Filled 2017-02-21: qty 1

## 2017-02-21 MED ORDER — BUPIVACAINE-EPINEPHRINE (PF) 0.25% -1:200000 IJ SOLN
INTRAMUSCULAR | Status: AC
  Filled 2017-02-21: qty 30

## 2017-02-21 MED ORDER — PROPOFOL 10 MG/ML IV BOLUS
INTRAVENOUS | Status: DC | PRN
  Administered 2017-02-21: 150 mg via INTRAVENOUS

## 2017-02-21 MED ORDER — MENTHOL 3 MG MT LOZG: 1.0000 | LOZENGE | OROMUCOSAL | Status: DC | PRN

## 2017-02-21 MED ORDER — BISACODYL 5 MG PO TBEC: 20.0000 mg | DELAYED_RELEASE_TABLET | Freq: Once | ORAL | Status: DC

## 2017-02-21 MED ORDER — ONDANSETRON HCL 4 MG/2ML IJ SOLN
INTRAMUSCULAR | Status: AC
  Filled 2017-02-21: qty 2

## 2017-02-21 MED ORDER — FENTANYL CITRATE (PF) 100 MCG/2ML IJ SOLN
INTRAMUSCULAR | Status: DC | PRN
  Administered 2017-02-21: 50 ug via INTRAVENOUS
  Administered 2017-02-21: 25 ug via INTRAVENOUS
  Administered 2017-02-21: 100 ug via INTRAVENOUS
  Administered 2017-02-21: 75 ug via INTRAVENOUS

## 2017-02-21 MED ORDER — ENOXAPARIN SODIUM 40 MG/0.4ML ~~LOC~~ SOLN
40.0000 mg | SUBCUTANEOUS | Status: DC
  Administered 2017-02-22 – 2017-02-24 (×3): 40 mg via SUBCUTANEOUS
  Filled 2017-02-21 (×4): qty 0.4

## 2017-02-21 MED ORDER — ACETAMINOPHEN 500 MG PO TABS
1000.0000 mg | ORAL_TABLET | Freq: Three times a day (TID) | ORAL | Status: DC
  Administered 2017-02-21 – 2017-02-25 (×11): 1000 mg via ORAL
  Filled 2017-02-21 (×11): qty 2

## 2017-02-21 MED ORDER — GUAIFENESIN-DM 100-10 MG/5ML PO SYRP: 10.0000 mL | ORAL_SOLUTION | ORAL | Status: DC | PRN

## 2017-02-21 MED ORDER — LACTATED RINGERS IV SOLN: 1000.0000 mL | Freq: Three times a day (TID) | INTRAVENOUS | Status: AC | PRN

## 2017-02-21 MED ORDER — DEXTROSE 5 % IV SOLN
2.0000 g | INTRAVENOUS | Status: AC
  Administered 2017-02-21: 2 g via INTRAVENOUS
  Filled 2017-02-21 (×2): qty 2

## 2017-02-21 MED ORDER — LACTATED RINGERS IR SOLN
Status: DC | PRN
  Administered 2017-02-21: 1000 mL

## 2017-02-21 MED ORDER — LACTATED RINGERS IV SOLN
INTRAVENOUS | Status: DC | PRN
  Administered 2017-02-21 (×2): via INTRAVENOUS

## 2017-02-21 MED ORDER — ONDANSETRON HCL 4 MG/2ML IJ SOLN: 4.0000 mg | Freq: Four times a day (QID) | INTRAMUSCULAR | Status: DC | PRN

## 2017-02-21 MED ORDER — GENTAMICIN SULFATE 40 MG/ML IJ SOLN
INTRAMUSCULAR | Status: DC | PRN
  Administered 2017-02-21: 1000 mL

## 2017-02-21 SURGICAL SUPPLY — 106 items
APPLIER CLIP 5 13 M/L LIGAMAX5 (MISCELLANEOUS)
APPLIER CLIP ROT 10 11.4 M/L (STAPLE)
BARRIER SKIN 2 3/4 (OSTOMY) ×2 IMPLANT
BLADE EXTENDED COATED 6.5IN (ELECTRODE) IMPLANT
CANNULA REDUC XI 12-8 STAPL (CANNULA) ×1
CANNULA REDUCER 12-8 DVNC XI (CANNULA) ×1 IMPLANT
CELLS DAT CNTRL 66122 CELL SVR (MISCELLANEOUS) ×1 IMPLANT
CHLORAPREP W/TINT 26ML (MISCELLANEOUS) ×2 IMPLANT
CLIP APPLIE 5 13 M/L LIGAMAX5 (MISCELLANEOUS) IMPLANT
CLIP APPLIE ROT 10 11.4 M/L (STAPLE) IMPLANT
CLIP LIGATING HEM O LOK PURPLE (MISCELLANEOUS) IMPLANT
CLIP LIGATING HEMO O LOK GREEN (MISCELLANEOUS) IMPLANT
COUNTER NEEDLE 20 DBL MAG RED (NEEDLE) ×2 IMPLANT
COVER SURGICAL LIGHT HANDLE (MISCELLANEOUS) ×2 IMPLANT
COVER TIP SHEARS 8 DVNC (MISCELLANEOUS) ×1 IMPLANT
COVER TIP SHEARS 8MM DA VINCI (MISCELLANEOUS) ×1
DECANTER SPIKE VIAL GLASS SM (MISCELLANEOUS) ×2 IMPLANT
DEVICE TROCAR PUNCTURE CLOSURE (ENDOMECHANICALS) IMPLANT
DRAIN CHANNEL 19F RND (DRAIN) ×2 IMPLANT
DRAPE ARM DVNC X/XI (DISPOSABLE) ×4 IMPLANT
DRAPE COLUMN DVNC XI (DISPOSABLE) ×1 IMPLANT
DRAPE DA VINCI XI ARM (DISPOSABLE) ×4
DRAPE DA VINCI XI COLUMN (DISPOSABLE) ×1
DRAPE SURG IRRIG POUCH 19X23 (DRAPES) ×2 IMPLANT
DRSG OPSITE POSTOP 4X10 (GAUZE/BANDAGES/DRESSINGS) ×2 IMPLANT
DRSG OPSITE POSTOP 4X6 (GAUZE/BANDAGES/DRESSINGS) IMPLANT
DRSG OPSITE POSTOP 4X8 (GAUZE/BANDAGES/DRESSINGS) IMPLANT
DRSG TEGADERM 2-3/8X2-3/4 SM (GAUZE/BANDAGES/DRESSINGS) ×2 IMPLANT
DRSG TEGADERM 4X4.75 (GAUZE/BANDAGES/DRESSINGS) ×2 IMPLANT
ELECT PENCIL ROCKER SW 15FT (MISCELLANEOUS) IMPLANT
ELECT REM PT RETURN 15FT ADLT (MISCELLANEOUS) ×2 IMPLANT
ENDOLOOP SUT PDS II  0 18 (SUTURE)
ENDOLOOP SUT PDS II 0 18 (SUTURE) IMPLANT
EVACUATOR SILICONE 100CC (DRAIN) ×2 IMPLANT
GAUZE SPONGE 2X2 8PLY STRL LF (GAUZE/BANDAGES/DRESSINGS) ×1 IMPLANT
GAUZE SPONGE 4X4 12PLY STRL (GAUZE/BANDAGES/DRESSINGS) IMPLANT
GLOVE ECLIPSE 8.0 STRL XLNG CF (GLOVE) ×8 IMPLANT
GLOVE INDICATOR 8.0 STRL GRN (GLOVE) ×8 IMPLANT
GOWN STRL REUS W/TWL XL LVL3 (GOWN DISPOSABLE) ×10 IMPLANT
GRASPER ENDOPATH ANVIL 10MM (MISCELLANEOUS) IMPLANT
HOLDER FOLEY CATH W/STRAP (MISCELLANEOUS) ×2 IMPLANT
IRRIG SUCT STRYKERFLOW 2 WTIP (MISCELLANEOUS) ×2
IRRIGATION SUCT STRKRFLW 2 WTP (MISCELLANEOUS) ×1 IMPLANT
KIT PROCEDURE DA VINCI SI (MISCELLANEOUS)
KIT PROCEDURE DVNC SI (MISCELLANEOUS) IMPLANT
LEGGING LITHOTOMY PAIR STRL (DRAPES) ×2 IMPLANT
LUBRICANT JELLY K Y 4OZ (MISCELLANEOUS) ×2 IMPLANT
NEEDLE INSUFFLATION 14GA 120MM (NEEDLE) ×2 IMPLANT
PACK CARDIOVASCULAR III (CUSTOM PROCEDURE TRAY) ×2 IMPLANT
PACK COLON (CUSTOM PROCEDURE TRAY) ×2 IMPLANT
PAD POSITIONING PINK XL (MISCELLANEOUS) ×2 IMPLANT
PORT LAP GEL ALEXIS MED 5-9CM (MISCELLANEOUS) IMPLANT
POUCH OSTOMY 1 PC DRNBL  2 1/2 (OSTOMY) ×1 IMPLANT
POUCH OSTOMY 2 1/2 (OSTOMY) ×1
RETRACTOR LONE STAR DISPOSABLE (INSTRUMENTS) ×4 IMPLANT
RETRACTOR STAY HOOK 5MM (MISCELLANEOUS) ×4 IMPLANT
RTRCTR WOUND ALEXIS 18CM MED (MISCELLANEOUS) ×2
SCISSORS LAP 5X35 DISP (ENDOMECHANICALS) ×2 IMPLANT
SEAL CANN UNIV 5-8 DVNC XI (MISCELLANEOUS) ×3 IMPLANT
SEAL XI 5MM-8MM UNIVERSAL (MISCELLANEOUS) ×3
SEALER VESSEL DA VINCI XI (MISCELLANEOUS) ×1
SEALER VESSEL EXT DVNC XI (MISCELLANEOUS) ×1 IMPLANT
SLEEVE ADV FIXATION 5X100MM (TROCAR) IMPLANT
SLEEVE SURGEON STRL (DRAPES) ×2 IMPLANT
SOLUTION ELECTROLUBE (MISCELLANEOUS) ×2 IMPLANT
SPONGE GAUZE 2X2 STER 10/PKG (GAUZE/BANDAGES/DRESSINGS) ×1
STAPLER 45 BLU RELOAD XI (STAPLE) ×1 IMPLANT
STAPLER 45 BLUE RELOAD XI (STAPLE) ×1
STAPLER 45 GREEN RELOAD XI (STAPLE)
STAPLER 45 GRN RELOAD XI (STAPLE) IMPLANT
STAPLER CANNULA SEAL DVNC XI (STAPLE) ×1 IMPLANT
STAPLER CANNULA SEAL XI (STAPLE) ×1
STAPLER SHEATH (SHEATH) ×1
STAPLER SHEATH ENDOWRIST DVNC (SHEATH) ×1 IMPLANT
SUT MNCRL AB 4-0 PS2 18 (SUTURE) ×8 IMPLANT
SUT PDS AB 1 CTX 36 (SUTURE) ×4 IMPLANT
SUT PDS AB 1 TP1 96 (SUTURE) ×2 IMPLANT
SUT PDS AB 2-0 CT2 27 (SUTURE) IMPLANT
SUT PROLENE 0 CT 2 (SUTURE) ×2 IMPLANT
SUT PROLENE 2 0 KS (SUTURE) ×2 IMPLANT
SUT PROLENE 2 0 SH DA (SUTURE) ×4 IMPLANT
SUT SILK 2 0 (SUTURE) ×1
SUT SILK 2 0 SH (SUTURE) ×4 IMPLANT
SUT SILK 2 0 SH CR/8 (SUTURE) ×2 IMPLANT
SUT SILK 2-0 18XBRD TIE 12 (SUTURE) ×1 IMPLANT
SUT SILK 3 0 (SUTURE) ×1
SUT SILK 3 0 SH CR/8 (SUTURE) ×2 IMPLANT
SUT SILK 3-0 18XBRD TIE 12 (SUTURE) ×1 IMPLANT
SUT V-LOC BARB 180 2/0GR6 GS22 (SUTURE) ×10
SUT VIC AB 2-0 SH 18 (SUTURE) ×6 IMPLANT
SUT VIC AB 3-0 SH 18 (SUTURE) IMPLANT
SUT VIC AB 3-0 SH 27 (SUTURE)
SUT VIC AB 3-0 SH 27XBRD (SUTURE) IMPLANT
SUT VICRYL 0 UR6 27IN ABS (SUTURE) ×4 IMPLANT
SUT VLOC 180 2-0 9IN GS21 (SUTURE) ×4 IMPLANT
SUTURE V-LC BRB 180 2/0GR6GS22 (SUTURE) ×5 IMPLANT
SYR 10ML LL (SYRINGE) ×2 IMPLANT
SYS LAPSCP GELPORT 120MM (MISCELLANEOUS)
SYSTEM LAPSCP GELPORT 120MM (MISCELLANEOUS) IMPLANT
TAPE UMBILICAL COTTON 1/8X30 (MISCELLANEOUS) ×2 IMPLANT
TOWEL OR 17X26 10 PK STRL BLUE (TOWEL DISPOSABLE) IMPLANT
TOWEL OR NON WOVEN STRL DISP B (DISPOSABLE) ×2 IMPLANT
TRAY FOLEY W/METER SILVER 16FR (SET/KITS/TRAYS/PACK) ×2 IMPLANT
TROCAR ADV FIXATION 5X100MM (TROCAR) ×2 IMPLANT
TUBING CONNECTING 10 (TUBING) IMPLANT
TUBING INSUFFLATION 10FT LAP (TUBING) ×2 IMPLANT

## 2017-02-21 NOTE — Anesthesia Procedure Notes (Signed)
Procedure Name: Intubation Date/Time: 02/21/2017 8:46 AM Performed by: Glory Buff Pre-anesthesia Checklist: Patient identified, Emergency Drugs available, Suction available and Patient being monitored Patient Re-evaluated:Patient Re-evaluated prior to inductionOxygen Delivery Method: Circle system utilized Preoxygenation: Pre-oxygenation with 100% oxygen Intubation Type: IV induction Ventilation: Mask ventilation without difficulty Laryngoscope Size: Miller and 3 Grade View: Grade I Tube type: Oral Tube size: 7.5 mm Number of attempts: 1 Airway Equipment and Method: Stylet and Oral airway Placement Confirmation: ETT inserted through vocal cords under direct vision,  positive ETCO2 and breath sounds checked- equal and bilateral Secured at: 21 cm Tube secured with: Tape Dental Injury: Teeth and Oropharynx as per pre-operative assessment

## 2017-02-21 NOTE — H&P (Signed)
David Clarke 01/15/2017 12:18 PM Location: [facility] Patient #: [id number] DOB: 1956-05-06 Unknown / Language: Cleophus Molt / Race: White Male  Patient Care Team: Robyn Haber, MD as PCP - General (Family Medicine) Michael Boston, MD as Consulting Physician (General Surgery) Pyrtle, Lajuan Lines, MD as Consulting Physician (Gastroenterology) Truitt Merle, MD as Consulting Physician (Hematology) Kyung Rudd, MD as Consulting Physician (Radiation Oncology)    History of Present Illness   The patient is a 61 year old male who presents with colorectal cancer. Note for "Colorectal cancer": ` ` ` Patient returns for diagnosis of large bulky rectal cancer. Please see prior note.  Patient has completed neoadjuvant chemoradiation therapy in late April. He is feeling better. He comes today with his wife and daughter. The tumor is no longer massively protruding at the anus but just barely coming out now. 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 He is no longer losing weight. Not exactly gaining though but he is eating much better. Moving his bowels every day. No severe leaking. Bleeding much less. He is walking a mile or 2 a day. In better spirits overall. Energy level coming back. Comes back for discussion of surgery.    (Review of systems as stated in this history (HPI) or in the review of systems. Otherwise all other 12 point ROS are negative)   Allergies Malachy Moan, RMA; 01/15/2017 12:18 PM) Sulfa Antibiotics  Nausea Allergies Reconciled   Medication History Malachy Moan, RMA; 01/15/2017 12:19 PM) Multiple Vitamins (Oral) Active. Medications Reconciled  Vitals Malachy Moan RMA; 01/15/2017 12:19 PM) 01/15/2017 12:19 PM Weight: 130.2 lb Height: 72in Body Surface Area: 1.78 m Body Mass Index: 17.66 kg/m  Temp.: 97.62F  Pulse: 85 (Regular)  BP: 112/80 (Sitting, Left Arm, Standard)   BP 122/83   Pulse 76   Temp 98.1 F (36.7 C)  (Oral)   Resp 18   Ht 6' (1.829 m)   Wt 64.9 kg (143 lb)   SpO2 99%   BMI 19.39 kg/m      Physical Exam [name] MD; 01/15/2017 5:52 PM) General Mental Status-Alert. General Appearance-Not in acute distress. Voice-Normal. Note: Thin and somewhat cachectic but very active and energetic.   Integumentary Global Assessment Upon inspection and palpation of skin surfaces of the - Distribution of scalp and body hair is normal. General Characteristics Overall examination of the patient's skin reveals - no rashes and no suspicious lesions.  Head and Neck Head-normocephalic, atraumatic with no lesions or palpable masses. Face Global Assessment - atraumatic, no absence of expression. Neck Global Assessment - no abnormal movements, no decreased range of motion. Trachea-midline. Thyroid Gland Characteristics - non-tender.  Eye Eyeball - Left-Extraocular movements intact, No Nystagmus. Eyeball - Right-Extraocular movements intact, No Nystagmus. Upper Eyelid - Left-No Cyanotic. Upper Eyelid - Right-No Cyanotic.  Chest and Lung Exam Inspection Accessory muscles - No use of accessory muscles in breathing.  Rectal Note: Bulky right anterior and lateral anal rectal cancer. A little bit protruding. Certainly improved from the massive anal protrusion. Decreased sphincter tone. Fixed anteriorly.   Peripheral Vascular Upper Extremity Inspection - Left - Not Gangrenous, No Petechiae. Right - Not Gangrenous, No Petechiae.  Neurologic Neurologic evaluation reveals -normal attention span and ability to concentrate, able to name objects and repeat phrases. Appropriate fund of knowledge and normal coordination.  Neuropsychiatric Mental status exam performed with findings of-able to articulate well with normal speech/language, rate, volume and coherence and no evidence of hallucinations, delusions, obsessions or  homicidal/suicidal  ideation. Orientation-oriented X3.  Musculoskeletal Global Assessment Gait and Station - normal gait and station.  Lymphatic General Lymphatics Description - No Generalized lymphadenopathy.    Assessment & Plan  RECTAL CANCER (C20)  Impression: Long low rectal cancer erupting out the anus. External component nearly resolved. Shrunk down to be primarily anterior and right lateral. Still somewhat fixed. CT scan was some common iliac lymphadenopathy. May be just at the base of the inferior mesenteric artery. We'll try to consider resecting an region at the time of surgery.  Repeat MRI of pelvis to make sure there is no prostatic involvement. Initial read seem negative but we'll discuss again with radiology. Should the very concerns of prostatic involvement, may need concurrent prostatectomy. Hopefully less likely now with a neoadjuvant chemoradiation therapy  Plan abdominoperineal resection with end colostomy. Would plan around 10 weeks after surgery. I believe 9 week point is to be late June.  Focus on nutritional labs. Albumin now in the mid twos. It had been in the ones for the longest time. I am trying to take supplemental shakes and get his protein and nutrition up the next 56 weeks so that he is in a better place for surgery.   Current Plans Pt Education - CCS Ostomy HCI (Manette Doto): discussed with patient and provided information. PREOP COLON - ENCOUNTER FOR PREOPERATIVE EXAMINATION FOR GENERAL SURGICAL PROCEDURE (Z01.818) Current Plans Written instructions provided Pt Education - Pamphlet Given - Laparoscopic Colorectal Surgery: discussed with patient and provided information. Pt Education - CCS Colon Bowel Prep 2015 Miralax/Antibiotics Restarted Neomycin Sulfate 500MG , 2 (two) Tablet SEE NOTE, #6, 01/15/2017, No Refill. Local Order: TAKE TWO TABLETS AT 2 PM, 3 PM, AND 10 PM THE DAY PRIOR TO SURGERY Restarted Flagyl 500MG , 2 (two) Tablet SEE NOTE, #6, 01/15/2017, No  Refill. Local Order: Take at 2pm, 3pm, and 10pm the day prior to your colon operation The anatomy & physiology of the digestive tract was discussed. The pathophysiology of the colon was discussed. Natural history risks without surgery was discussed. I feel the risks of no intervention will lead to serious problems that outweigh the operative risks; therefore, I recommended a partial colectomy to remove the pathology. Minimally invasive (Robotic/Laparoscopic) & open techniques were discussed.  Risks such as bleeding, infection, abscess, leak, reoperation, possible ostomy, hernia, heart attack, death, and other risks were discussed. I noted a good likelihood this will help address the problem. Goals of post-operative recovery were discussed as well. Need for adequate nutrition, daily bowel regimen and healthy physical activity, to optimize recovery was noted as well. We will work to minimize complications. Educational materials were available as well. Questions were answered. The patient expresses understanding & wishes to proceed with surgery.  Pt Education - CCS Colectomy post-op instructions: discussed with patient and provided information.   [name], M.D., F.A.C.S. Gastrointestinal and Minimally Invasive Surgery [facility] [address], [place], [place] [address] [phone] Main / Paging

## 2017-02-21 NOTE — Transfer of Care (Signed)
Immediate Anesthesia Transfer of Care Note  Patient: David Clarke  Procedure(s) Performed: Procedure(s): XI ROBOT ABDOMINOPERINEAL RESECTION WITH COLOSTOMY (N/A)  Patient Location: PACU  Anesthesia Type:General  Level of Consciousness: awake, alert  and oriented  Airway & Oxygen Therapy: Patient Spontanous Breathing and Patient connected to face mask oxygen  Post-op Assessment: Report given to RN and Post -op Vital signs reviewed and stable  Post vital signs: Reviewed and stable  Last Vitals:  Vitals:   02/21/17 0645  BP: 122/83  Pulse: 76  Resp: 18  Temp: 36.7 C    Last Pain:  Vitals:   02/21/17 0645  TempSrc: Oral      Patients Stated Pain Goal: 4 (54/86/28 2417)  Complications: No apparent anesthesia complications

## 2017-02-21 NOTE — Progress Notes (Signed)
Pt experiencing some nausea this evening.  Only advanced to clear liquids so far.  Movement seems to bring on the nausea. Only dangled so far this evening.

## 2017-02-21 NOTE — Op Note (Signed)
12:48 PM  PATIENT:  David Clarke  61 y.o. male  Patient Care Team: Robyn Haber, MD as PCP - General (Family Medicine) Michael Boston, MD as Consulting Physician (General Surgery) Pyrtle, Lajuan Lines, MD as Consulting Physician (Gastroenterology) Truitt Merle, MD as Consulting Physician (Hematology and Oncology) Kyung Rudd, MD as Consulting Physician (Radiation Oncology)  PRE-OPERATIVE DIAGNOSIS:  Very low rectal cancer  POST-OPERATIVE DIAGNOSIS:  Very low rectal cancer  PROCEDURE:   XI ROBOTIC ABDOMINOPERINEAL RESECTION WITH COLOSTOMY  SURGEON:  [name], MD  ASSISTANT: Leighton Ruff, MD, FACS.  ANESTHESIA:   General.  Local field block  EBL:  Total I/O In: 1000 [I.V.:1000] Out: 300 [Urine:100; Blood:200] "see anesthesia record"  Delay start of Pharmacological VTE agent (>24hrs) due to surgical blood loss or risk of bleeding:  no  DRAINS: 19 Fr Blake drain in the pelvis   SPECIMEN:   1.  Rectosigmoid colon including anus and sphincter and pelvic floor.  Silk stitch marks closest anterior margin at urethra 2.  Left lateral pelvic floor fat and muscle margin. 3.  Left lateral segment internal liver mass biopsy 4.  Proximal colon margin  DISPOSITION OF SPECIMEN:  PATHOLOGY  COUNTS:  YES  PLAN OF CARE: Admit to inpatient   PATIENT DISPOSITION:  PACU - hemodynamically stable.  INDICATION:    Patient with very low anterior rectal cancer causing bleeding and incontinence.  Undergone neoadjuvant chemoradiation therapy with some decent clinical response.  No strong evidence of distant metastatic disease.  I did not feel that he was a candidate for sphincter sparing surgery since this involved the pelvic floor and sphincter complex.    I recommended segmental resection:  The anatomy & physiology of the digestive tract was discussed.  The pathophysiology was discussed.  Natural history risks without surgery was discussed.   I worked to give an overview of the disease and  the frequent need to have multispecialty involvement.  I feel the risks of no intervention will lead to serious problems that outweigh the operative risks; therefore, I recommended a partial colectomy to remove the pathology.  Laparoscopic & open techniques were discussed.   Risks such as bleeding, infection, abscess, leak, reoperation, possible ostomy, hernia, heart attack, death, and other risks were discussed.  I noted a good likelihood this will help address the problem.   Goals of post-operative recovery were discussed as well.  We will work to minimize complications.  Educational materials on the pathology had been given in the office.  Questions were answered.    The patient expressed understanding & wished to proceed with surgery.  OR FINDINGS:   Patient had a bulky very distal rectal cancer involving the anal canal.  Scar densely adherent to the urethra just distal to the prostate.  Soft somewhat collapsible light pink 2 x 2 centimeter mass on the medial aspect of the left lateral sector just left lateral of falciform ligament.  Not classic for metastatic disease but wedge biopsy done.  No other obvious metastatic disease on visceral parietal peritoneum or liver.  PROCEDURE:  Informed consent was confirmed.  The patient underwent general anaesthesia without difficulty.  The patient was positioned appropriately.  VTE prevention in place.  The patient's abdomen was clipped, prepped, & draped in a sterile fashion.  Surgical timeout confirmed our plan.  The patient was positioned in reverse Trendelenburg.  Abdominal entry was gained using optical entry technique in the  right upper abdomen.  Entry was clean.  I induced carbon dioxide insufflation.  Camera inspection revealed no injury.  Extra ports were carefully placed under direct laparoscopic visualization.  I reflected the greater omentum and the upper abdomen the small bowel in the upper abdomen.  The patient was carefully positioned.  The  Intuitive daVinci robot was carefully docked with camera & instruments carefully placed.  The patient had a light pink somewhat soft mass in the inner aspect of the left lateral sector of the liver close to the falciform ligament.  Most likely benign but I did end up sharply excising part of it for biopsy.  Controlled liver edge with cautery.  There was no other evidence of visceral or parietal peritoneal metastatic disease.  Patient had a very stretched out redundant sigmoid colon with probably an extra foot in the pelvis.  I elevated that in placed and under tension.  I scored the base of peritoneum of the medial side of the mesentery of the left colon from the ligament of Treitz to the peritoneal reflection of the mid rectum.   I elevated the sigmoid mesentery and entered into the retro-mesenteric plane. We were able to identify the left ureter and gonadal vessels. We kept those posterior within the retroperitoneum and elevated the left colon mesentery off that. I did isolated IMA pedicle but did not ligate it yet.  I continued distally and got into the avascular plane posterior to the mesorectum. This allowed me to help mobilize the rectum as well by freeing the mesorectum off the sacrum.  I mobilized the peritoneal coverings towards the peritoneal reflection on both the right and left sides of the rectum.  I stayed away from the right and left ureters.  I kept the lateral vascular pedicles to the rectum intact.  Proceed with total mesorectal excision.  Gradually exposed the rectum and freed it off its attachments to the pelvis.  Continued posteriorly.  Gradually came around to skeletonize and transect the lateral stalks.  Was able to score around the peritoneal reflection which was rather low.   I freed it off the attachments to the coccyx and came across the tendon into the posterior pelvic floor musculature.  Came around laterally as well to come through the pelvic floor musculature as well to avoid any  coning from the abdominal peritoneal resection.  I came around anteriorly.  Carefully freed thickened anterior rectal wall off the seminal vesicles and prostate.  This was the most bulky aspect of the remaining tumor consistent with the primarily anterior scar.  Eventually came down distal to the prostate  Switched over to high ligation of the rectosigmoid segment.   I skeletonized the lymph nodes off the inferior mesenteric artery pedicle.  I went down to its takeoff from the aorta.   I isolated the inferior mesenteric vein off of the ligament of Treitz just cephalad to that as well.  After confirming the left ureter was out of the way, I went ahead and ligated the inferior mesenteric artery pedicle just near its takeoff from the aorta.  I did ligate the inferior mesenteric vein in a similar fashion.  We ensured hemostasis.  Freed the greater omentum off the mid transverse colon and followed up towards the splenic flexure.  The splenic flexure was densely adherent to the spleen.  We carefully freed that off.  I mobilized the sigmoid colon in a lateral to medial fashion to help mobilize the peritoneum towards the midline and protected.  Chose a region in the distal descending colon that could reach down transected the mesentery  radially, preserving the left colic and middle colic pedicles.  Stapled off at the proximal sigmoid colon to have some redundancy to pull up for a colostomy in th this thin patient.   We allowed the rectosigmoid colon the phone to the pelvis."  The peritoneal reflection from the anterior pelvis up towards the ligament Treitz using 20 serrated V lock suture.  Placed a Blake drain through the retroperitoneal peritoneal opening down towards the pelvis.  Secured it at the right lateral port site with Prolene suture.  Proximal sigmoid colon stump clamped.  I proceed with perineal resection.  Did biconcave incision in the base of the scrotum towards the coccyx.  Came to the subcutaneous  tissues.  I focus going posteriorly and easily came through with a little remaining pelvic floor to the posterior transection the coccyx.  I then transected pelvic floor laterally on both sides and came around anteriorly.  Still had very dense adhesions to the anterior pelvis.  Caryl Pina came through carefully and freed off the rectum and scar off the urethra and removed it in its entirety.  There was some redundant flaps of left lateral pelvic wall that I excised.  The urethra just distal to prostate was thinned out but intact.  No evidence of any leak.  I assured hemostasis.  I confirm the drain was down in the low pelvis.  I irrigated with anabolic Stokes solution.  Dr. Marcello Moores brought up the colostomy through the premarked left periumbilical flank incision and transect it off leaving a good 3 cm cuff.  This extra proximal colon sent with the specimen for potential lymph nodes.   Stapler port sites closed with 0 Vicryl.  Skin closed at remaining port sites with Monocryl suture.  Sterile dressings applied.   Colostomy matured with 2-0 Vicryl interrupted suture and a Brook type fashion.  Appliance was placed.  Meanwhile, I closed the pelvis using 2-0 V lock serrated suture at the pelvic floor, 2-0 Vicryl deep tissues and deep dermal interrupted sutures.  4-0 Monocryl to skin.  Umbilical tape antibiotic-soaked wicks were placed in the wound 2.  Dressing placed.  Patient  was extubated and is in the recovery room in stable condition.  I long discussion with the patient's family initially wife daughter and sister.  Went over postoperative expectations survival pathway etc.  Spent over 20 minutes discussing with them.  They expressed understanding and appreciation.     [name], M.D., F.A.C.S. Gastrointestinal and Minimally Invasive Surgery [facility] [address] [place], [place] [address] [phone] Main / Paging

## 2017-02-21 NOTE — Anesthesia Postprocedure Evaluation (Signed)
Anesthesia Post Note  Patient: David Clarke  Procedure(s) Performed: Procedure(s) (LRB): XI ROBOT ABDOMINOPERINEAL RESECTION WITH COLOSTOMY (N/A)     Patient location during evaluation: PACU Anesthesia Type: General Level of consciousness: awake and alert Pain management: pain level controlled Vital Signs Assessment: post-procedure vital signs reviewed and stable Respiratory status: spontaneous breathing, nonlabored ventilation, respiratory function stable and patient connected to nasal cannula oxygen Cardiovascular status: blood pressure returned to baseline and stable Postop Assessment: no signs of nausea or vomiting Anesthetic complications: no    Last Vitals:  Vitals:   02/21/17 1354 02/21/17 1412  BP: 130/88 140/86  Pulse:  66  Resp:  20  Temp: 36.7 C 36.6 C    Last Pain:  Vitals:   02/21/17 1354  TempSrc:   PainSc: 3                  Etsuko Dierolf EDWARD

## 2017-02-22 LAB — BASIC METABOLIC PANEL
Anion gap: 6 (ref 5–15)
BUN: 10 mg/dL (ref 6–20)
CHLORIDE: 105 mmol/L (ref 101–111)
CO2: 27 mmol/L (ref 22–32)
Calcium: 8.4 mg/dL — ABNORMAL LOW (ref 8.9–10.3)
Creatinine, Ser: 0.72 mg/dL (ref 0.61–1.24)
GFR calc Af Amer: >60 mL/min (ref >60)
GLUCOSE: 111 mg/dL — AB (ref 65–99)
POTASSIUM: 4.2 mmol/L (ref 3.5–5.1)
Sodium: 138 mmol/L (ref 135–145)

## 2017-02-22 LAB — CBC
HEMATOCRIT: 28.7 % — AB (ref 39.0–52.0)
HEMOGLOBIN: 8.9 g/dL — AB (ref 13.0–17.0)
MCH: 27.8 pg (ref 26.0–34.0)
MCHC: 31 g/dL (ref 30.0–36.0)
MCV: 89.7 fL (ref 78.0–100.0)
Platelets: 342 10*3/uL (ref 150–400)
RBC: 3.2 MIL/uL — ABNORMAL LOW (ref 4.22–5.81)
RDW: 14.7 % (ref 11.5–15.5)
WBC: 9.8 10*3/uL (ref 4.0–10.5)

## 2017-02-22 LAB — MAGNESIUM: Magnesium: 2 mg/dL (ref 1.7–2.4)

## 2017-02-22 MED ORDER — METOCLOPRAMIDE HCL 5 MG/ML IJ SOLN: 5.0000 mg | Freq: Four times a day (QID) | INTRAMUSCULAR | Status: DC | PRN

## 2017-02-22 NOTE — Progress Notes (Signed)
[facility]  [address] [place], [place] [address] Phone: [phone]  FAX: [phone]      David Clarke [id number] August 11, 1956  CARE TEAM:  PCP: Robyn Haber, MD  Outpatient Care Team: Patient Care Team: Robyn Haber, MD as PCP - General (Family Medicine) Michael Boston, MD as Consulting Physician (General Surgery) Pyrtle, Lajuan Lines, MD as Consulting Physician (Gastroenterology) Truitt Merle, MD as Consulting Physician (Hematology and Oncology) Kyung Rudd, MD as Consulting Physician (Radiation Oncology)  Inpatient Treatment Team: Treatment Team: Attending Provider: Michael Boston, MD; Registered Nurse: Dorita Sciara, RN; Technician: Etheleen Sia, NT   Problem List:   Principal Problem:   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  Recommendations were made.  Questions were answered.  They expressed understanding & appreciation.  25 minutes spent in review, evaluation, examination, counseling, and coordination of care.  More than 50% of that time was spent in counseling.  David Clarke, M.D., F.A.C.S. Gastrointestinal and Minimally Invasive Surgery [facility] [address], [place], [place] [address] [phone] Main / Paging   02/22/2017    Subjective: (Chief complaint)  Nausea last night, not now Not walking yet Family at bedside   Objective:  Vital  signs:  Vitals:   02/22/17 0232 02/22/17 0412 02/22/17 0518 02/22/17 0602  BP:   109/67   Pulse: 63 60 60 (!) 58  Resp: _0 Temp:   98 F (36.7 C)   TempSrc:   Oral   SpO2: 98% 98% 100% 99%  Weight:    67 kg (147 lb 11.3 oz)  Height:        Last BM Date: 02/20/17  Intake/Output   Yesterday:  06/27 0701 - 06/28 0700 In: 3757.5 [P.O.:450; I.V.:3207.5; IV Piggyback:100] Out: 2065 [Urine:1525; Drains:340; Blood:200] This shift:  No intake/output data recorded.  Bowel function:  Flatus: No  BM:  YES  Drain: Serosanguinous   Physical Exam:  General: Pt awake/alert/oriented x4 in no acute distress Eyes: PERRL, normal EOM.  Sclera clear.  No icterus Neuro: CN II-XII intact w/o focal sensory/motor deficits. Lymph: No head/neck/groin lymphadenopathy Psych:  No delerium/psychosis/paranoia HENT: Normocephalic, Mucus membranes moist.  No thrush Neck: Supple, No tracheal deviation Chest: No chest wall pain w good excursion CV:  Pulses intact.  Regular rhythm MS: Normal AROM mjr joints.  No obvious deformity  Abdomen: Soft.  Nondistended.  Mildly tender at incisions only.  No evidence of peritonitis.  No incarcerated hernias.  GU:  NEMG.  R>L BIH but reducible.  Foley clear yellow urine Rectal:  Perineal closure with minimal old blood Ext:  No deformity.  No mjr edema.  No cyanosis Skin: No petechiae / purpura  Results:   Labs: Results for orders placed or performed during the hospital encounter of 02/21/17 (from the past 48 hour(s))  Basic metabolic panel     Status: Abnormal   Collection Time: 02/22/17  4:09 AM  Result Value Ref Range  Sodium 138 135 - 145 mmol/L   Potassium 4.2 3.5 - 5.1 mmol/L   Chloride 105 101 - 111 mmol/L   CO2 27 22 - 32 mmol/L   Glucose, Bld 111 (H) 65 - 99 mg/dL   BUN 10 6 - 20 mg/dL   Creatinine, Ser 0.72 0.61 - 1.24 mg/dL   Calcium 8.4 (L) 8.9 - 10.3 mg/dL   GFR calc non Af Amer >60 >60 mL/min   GFR calc Af Amer >60 >60  mL/min    Comment: (NOTE) The eGFR has been calculated using the CKD EPI equation. This calculation has not been validated in all clinical situations. eGFR's persistently <60 mL/min signify possible Chronic Kidney Disease.    Anion gap 6 5 - 15  CBC     Status: Abnormal   Collection Time: 02/22/17  4:09 AM  Result Value Ref Range   WBC 9.8 4.0 - 10.5 K/uL   RBC 3.20 (L) 4.22 - 5.81 MIL/uL   Hemoglobin 8.9 (L) 13.0 - 17.0 g/dL   HCT 28.7 (L) 39.0 - 52.0 %   MCV 89.7 78.0 - 100.0 fL   MCH 27.8 26.0 - 34.0 pg   MCHC 31.0 30.0 - 36.0 g/dL   RDW 14.7 11.5 - 15.5 %   Platelets 342 150 - 400 K/uL  Magnesium     Status: None   Collection Time: 02/22/17  4:09 AM  Result Value Ref Range   Magnesium 2.0 1.7 - 2.4 mg/dL    Imaging / Studies: No results found.  Medications / Allergies: per chart  Antibiotics: Anti-infectives    Start     Dose/Rate Route Frequency Ordered Stop   02/21/17 2000  cefoTEtan (CEFOTAN) 2 g in dextrose 5 % 50 mL IVPB     2 g 100 mL/hr over 30 Minutes Intravenous Every 12 hours 02/21/17 1429 02/21/17 2046   02/21/17 1224  clindamycin (CLEOCIN) 900 mg, gentamicin (GARAMYCIN) 240 mg in sodium chloride 0.9 % 1,000 mL for intraperitoneal lavage  Status:  Discontinued       As needed 02/21/17 1224 02/21/17 1248   02/21/17 0745  clindamycin (CLEOCIN) 900 mg, gentamicin (GARAMYCIN) 240 mg in sodium chloride 0.9 % 1,000 mL for intraperitoneal lavage  Status:  Discontinued      Intraperitoneal To Surgery 02/20/17 1717 02/21/17 1419   02/21/17 0647  cefoTEtan (CEFOTAN) 2 g in dextrose 5 % 50 mL IVPB     2 g 100 mL/hr over 30 Minutes Intravenous On call to O.R. 02/21/17 3220 02/21/17 2542        Note: Portions of this report may have been transcribed using voice recognition software. Every effort was made to ensure accuracy; however, inadvertent computerized transcription errors may be present.   Any transcriptional errors that result from this process are  unintentional.     David Clarke, M.D., F.A.C.S. Gastrointestinal and Minimally Invasive Surgery [facility] [address] [place], [place] [address] [phone] Main / Paging   02/22/2017

## 2017-02-22 NOTE — Consult Note (Signed)
[facility] Nurse ostomy consult note: Patient visit made in conjunction with [facility] Fara Olden, RN-C Stoma type/location: LLQ Colostomy (end). POD 1 (Dr. Johney Maine) Stomal assessment/size: 1 and 5/8 inch round, red, moist, edematous with bruising at 10 and 3 o'clock Peristomal assessment: Intact, clear Treatment options for stomal/peristomal skin: Skin barrier ring Output: Thin, brown liquid effluent  Ostomy pouching: 2pc. 2 and 3/4 inch pouching system with skin barrier ring Education provided: Extended session with significant partner [name]) present.  GI A&P, stoma characteristics, pouch characteristics taught. Diet and activity discussed. Patient and partner have had some preoperative education and have sought out others on the internet, most of which is helpful, but some is not. Pouch emptying demonstrated, patient and partner are able to open the end of the pouching system and both open and close the [place] and [place] closure feature of the [place] pouch. They observe the pouch removal process, also the sizing and pouching system preparation. They photograph and film (record) this activity for future referral. They are also provided with a one page, step-by-step instruction sheet on the two piece pouching system application and removal. They are instructed regarding stomal swelling post operatively and the need to periodically resized the ostomy to ensure a good fit and reduce the likelihood of peristomal skin complications. They ask appropriate questions and understand explanations. They are taught about a variety of pouching options, including opaque pouches, closed end pouches, irrigation (6-8 weeks postoperatively and with his surgeon's permission), also those with integrated gas filters.  He is taught about flatus. Patient is taught about the [employer] post discharge support and sampling program and he wishes to enroll in this.  He signs a consent form for samples to be shipped to his home address. 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 Patient is in agreement with the need for postoperative reinforcement of education and the provision of assistance as he learns to be independent in his ostomy care from a home health RN.  IF you agree, please order/arrange. Enrolled patient in [place] program: No   WOC nursing team will follow, will remain available to this patient, the nursing, surgical and medical teams.   Thanks, Maudie Flakes, MSN, RN, [place], Arther Abbott  Pager# [phone]

## 2017-02-22 NOTE — Progress Notes (Signed)
Spoke with pt and wife at bedside concerning [facility] and DME needs. Pt and wife would like to see WOC Margarita Grizzle, RN before making any decisions. Thank You.

## 2017-02-23 MED ORDER — TRAMADOL HCL 50 MG PO TABS: 50.0000 mg | ORAL_TABLET | Freq: Four times a day (QID) | ORAL | 0 refills | Status: DC | PRN

## 2017-02-23 MED ORDER — METHOCARBAMOL 500 MG PO TABS: 1000.0000 mg | ORAL_TABLET | Freq: Four times a day (QID) | ORAL | Status: DC | PRN

## 2017-02-23 MED ORDER — TRAMADOL HCL 50 MG PO TABS
50.0000 mg | ORAL_TABLET | Freq: Four times a day (QID) | ORAL | Status: DC | PRN
  Administered 2017-02-23 – 2017-02-25 (×3): 50 mg via ORAL
  Filled 2017-02-23 (×2): qty 1
  Filled 2017-02-23: qty 2

## 2017-02-23 MED ORDER — GABAPENTIN 300 MG PO CAPS: 300.0000 mg | ORAL_CAPSULE | Freq: Every day | ORAL | 1 refills | Status: DC

## 2017-02-23 MED ORDER — GABAPENTIN 300 MG PO CAPS: 300.0000 mg | ORAL_CAPSULE | Freq: Two times a day (BID) | ORAL | Status: DC

## 2017-02-23 MED ORDER — GABAPENTIN 300 MG PO CAPS
300.0000 mg | ORAL_CAPSULE | Freq: Every day | ORAL | Status: DC
  Administered 2017-02-23 – 2017-02-24 (×2): 300 mg via ORAL
  Filled 2017-02-23 (×2): qty 1

## 2017-02-23 MED ORDER — METHOCARBAMOL 500 MG PO TABS: 500.0000 mg | ORAL_TABLET | Freq: Three times a day (TID) | ORAL | 1 refills | Status: DC | PRN

## 2017-02-23 NOTE — Progress Notes (Addendum)
Will need HHRN orders from MD.

## 2017-02-23 NOTE — Consult Note (Addendum)
[facility] Nurse ostomy consult note Reason for Consult: Follow up discharge teaching ostomy LLQ Colostomy (end) POD 2 (Dr. Johney Maine) Stomal size: size not assessed but stoma is unchanged from yesterday. Patient was able to empty pouch into graduated cylinder and clean pouch without difficulty. He, his girlfriend and daughter asked appropriate questions. Very interested in learning everything possible.  New number to education channel given to patient at his request. Web site with helpful information given pt. Cookie with Case Management working with home health company to get visits scheduled. Answered questions about intimacy issues. Supplies given, 5 pouches, 5 rings, 5 appliances. Patient states Dr. Johney Maine said he will probably be discharged tomorrow, girlfriend anxious, states she has so much to get done. Answered all their questions and relayed discharge information to [place], [place], who came to see patient. Enrolled patient in [place] program: Yes  Fara Olden, RN-C, [facility] Wound Treatment Associate

## 2017-02-23 NOTE — Discharge Instructions (Signed)
Ostomy Support Information ° °You’ve heard that people get along just fine with only one of their eyes, or one of their lungs, or one of their kidneys. But you also know that you have only one intestine and only one bladder, and that leaves you feeling awfully empty, both physically and emotionally: You think no other people go around without part of their intestine with the ends of their intestines sticking out through their abdominal walls.  ° °YOU ARE NOT ALONE.  There are nearly three quarters of a million people in the [place] who have an ostomy; people who have had surgery to remove all or part of their colons or bladders.  ° °There is even a national association, the [employer] with over 350 local affiliated support groups that are organized by volunteers who provide peer support and counseling. [employer] has a toll free telephone num-ber, 800-826-0826 and an educational, interactive website, www.ostomy.org  ° °An ostomy is an opening in the belly (abdominal wall) made by surgery. Ostomates are people who have had this procedure. The opening (stoma) allows the kidney or bowel to discharge waste. An external pouch covers the stoma to collect waste. Pouches are are a simple bag and are odor free. Different companies have disposable or reusable pouches to fit one's lifestyle. An ostomy can either be temporary or permanent.  ° °THERE ARE THREE MAIN TYPES OF OSTOMIES °· Colostomy. A colostomy is a surgically created opening in the large intestine (colon). °· Ileostomy. An ileostomy is a surgically created opening in the small intestine. °· Urostomy. A urostomy is a surgically created opening to divert urine away from the bladder. ° °OSTOMY Care ° °The following guidelines will make care of your colostomy easier. Keep this information close by for quick reference. ° °Helpful DIET hints °Eat a well-balanced diet including vegetables and fresh fruits. Eat on a regular schedule. Drink at least 6 to 8  glasses of fluids daily. °Eat slowly in a relaxed atmosphere. Chew your food thoroughly. Avoid chewing gum, smoking, and drinking from a straw. This will help decrease the amount of air you swallow, which may help reduce gas. °Eating yogurt or drinking buttermilk may help reduce gas. ° °To control gas at night, do not eat after 8 p.m. This will give your bowel time to quiet down before you go to bed. ° °If gas is a problem, you can purchase Beano. Sprinkle Beano on the first bite of food before eating to reduce gas. It has no flavor and should not change the taste of your food. You can buy Beano over the counter at your local drugstore. ° °Foods like fish, onions, garlic, broccoli, asparagus, and cabbage produce odor. Although your pouch is odor-proof, if you eat these foods you may notice a stronger odor when emptying your pouch. If this is a concern, you may want to limit these foods in your diet. ° °If you have an ileostomy, you will have chronic diarrhea & need to drink more liquids to avoid getting dehydrated.  Consider antidiarrheal medicine like imodium (loperamide) or Lomotil to help slow down bowel movements / diarrhea into your ileostomy bag. ° °GETTING TO GOOD BOWEL HEALTH WITH AN ILEOSTOMY °. °Irregular bowel habits such as constipation and diarrhea can lead to many problems over time.  The goal: 3-6 small BOWEL MOVEMENTS A DAY!  To have soft, regular bowel movements:  °• Drink plenty of fluids, consider 4-6 tall glasses of water a day.   ° °Controlling   diarrheA ° °o Switch to liquids and simpler foods for a few days to avoid stressing your intestines further. °o Avoid dairy products (especially milk & ice cream) for a short time.  The intestines often can lose the ability to digest lactose when stressed. °o Avoid foods that cause gassiness or bloating.  Typical foods include beans and other legumes, cabbage, broccoli, and dairy foods.  Every person has some sensitivity to other foods, so listen to our  body and avoid those foods that trigger problems for you. °o Adding fiber (Citrucel, Metamucil, psyllium, Miralax) gradually can help thicken stools by absorbing excess fluid and retrain the intestines to act more normally.  Slowly increase the dose over a few weeks.  Too much fiber too soon can backfire and cause cramping & bloating. °o Probiotics (such as active yogurt, Align, etc) may help repopulate the intestines and colon with normal bacteria and calm down a sensitive digestive tract.  Most studies show it to be of mild help, though, and such products can be costly. °o Medicines: °- Bismuth subsalicylate (ex. Kayopectate, Pepto Bismol) every 30 minutes for up to 6 doses can help control diarrhea.  Avoid if pregnant. °- Loperamide (Immodium) can slow down diarrhea.  Start with two tablets (4mg total) first and then try one tablet every 6 hours.  Avoid if you are having fevers or severe pain.  If you are not better or start feeling worse, stop all medicines and call your doctor for advice °o Call your doctor if you are getting worse or not better.  Sometimes further testing (cultures, endoscopy, X-ray studies, bloodwork, etc) may be needed to help diagnose and treat the cause of the diarrhea. ° °TROUBLESHOOTING IRREGULAR BOWELS °1) Avoid extremes of bowel movements (no bad constipation/diarrhea) °2) Miralax 17gm mixed in 8oz. water or juice-daily. May use twice a day as needed  °3) Gas-x,Phazyme, etc. as needed for gas & bloating.  °4) Soft,bland diet. No spicy,greasy,fried foods.  °5) Prilosec (omeprazole) over-the-counter as needed  °6) May hold gluten/wheat products from diet to see if symptoms improve.  °7)  May try probiotics (Align, Activa, etc) to help calm the bowels down °7) If symptoms become worse call back immediately. ° ° °Applying the pouching system °To apply your pouch, follow these steps: ° °Place all your equipment close at hand before removing your pouch. ° °Wash your hands. ° °Stand or sit in  front of a mirror. Use the position that works best for you. Remember that you must keep the skin around the stoma wrinkle-free for a good seal. ° °Gently remove the used pouch (1-piece system) or the pouch and old wafer (2-piece system). Empty the pouch into the toilet. Save the closure clip to use again. ° °Wash the stoma itself and the skin around the stoma. Your stoma may bleed a little when being washed. This is normal. Rinse and pat dry. You may use a wash cloth or soft paper towels (like Bounty), mild soap (like Dial, Safeguard, or Ivory), and water. Avoid soaps that contain perfumes or lotions. ° °For a new pouch (1-piece system) or a new wafer (2-piece system), measure your stoma using the stoma guide in each box of supplies. ° °Trace the shape of your stoma onto the back of the new pouch or the back of the new wafer. Cut out the opening. Remove the paper backing and set it aside. ° °Optional: Apply a skin barrier powder to surrounding skin if it is irritated (bare or weeping),   and dust off the excess. °Optional: Apply a skin-prep wipe (such as Skin Prep or All-Kare) to the skin around the stoma, and let it dry. Do not apply this solution if the skin is irritated (red, tender, or broken) or if you have shaved around the stoma. °Optional: Apply a skin barrier paste (such as Stomahesive, Coloplast, or Premium) around the opening cut in the back of the pouch or wafer. Allow it to dry for 30 to 60 seconds. ° °Hold the pouch (1-piece system) or wafer (2-piece system) with the sticky side toward your body. Make sure the skin around the stoma is wrinkle-free. Center the opening on the stoma, then press firmly to your abdomen (Fig. 4). Look in the mirror to check if you are placing the pouch, or wafer, in the right position. For a 2-piece system, snap the pouch onto the wafer. Make sure it snaps into place securely. ° °Place your hand over the stoma and the pouch or wafer for about 30 seconds. The heat from your  hand can help the pouch or wafer stick to your skin. ° °Add deodorant (such as Super Banish or Nullo) to your pouch. Other options include food extracts such as vanilla oil and peppermint extract. Add about 10 drops of the deodorant to the pouch. Then apply the closure clamp. Note: Do not use toxic ° chemicals or commercial cleaning agents in your pouch. These substances may harm the stoma. ° °Optional: For extra seal, apply tape to all 4 sides around the pouch or wafer, as if you were framing a picture. You may use any brand of medical adhesive tape. °Change your pouch every 5 to 7 days. Change it immediately if a leak occurs.  Wash your hands afterwards. ° °If you are wearing a 2-piece system, you may use 2 new pouches per week and alternate them. Rinse the pouch with mild soap and warm water and hang it to dry for the next day. Apply the fresh pouch. Alternate the 2 pouches like this for a week. After a week, change the wafer and begin with 2 new pouches. Place the old pouches in a plastic bag, and put them in the trash. ° ° ° °Tips for colostomy care ° °Applying Your Pouch °You may stand or sit to apply your pouch. ° °Keep the skin where you apply the pouch wrinkle-free. If the skin around the pouch is wrinkled, the seal may break when your skin stretches. ° °If hair grows close to your stoma, you may trim off the hair with scissors, an electric razor, or a safety razor. ° °Always have a mirror nearby so you can get a better view of your stoma. ° °When you apply a new pouch, write the date on the adhesive tape. This will remind you of when you last changed your pouch. ° °Changing Your Pouch °The best time to change your pouch is in the morning, before eating or drinking anything. Your stoma can function at any time, but it will function more after eating or drinking. ° °Emptying Your Pouch °Empty your pouch when it is one-third full (of urine, stool, and/or gas). If you wait until your pouch is fuller than this,  it will be more difficult to empty and more noticeable. °When you empty your pouch, either put toilet paper in the toilet bowl first, or flush the toilet while you empty the pouch. This will reduce splashing. You can empty the pouch between your legs or to one side while sitting,   or while standing or stooping. If you have a 2-piece system, you can snap off the pouch to empty it. Remember that your stoma may function during this time. °If you wish to rinse your pouch after you empty it, a turkey baster can be helpful. When using a baster, squirt water up into the pouch through the opening at the °bottom. With a 2-piece system, you can snap off the pouch to rinse it. After rinsing  your pouch, empty it into the toilet. °When rinsing your pouch at home, put a few granules of Dreft soap in the rinse water. This helps lubricate and freshen your pouch. °The inside of your pouch can be sprayed with non-stick cooking oil (Pam spray). This may help reduce stool sticking to the inside of the pouch. ° °Bathing °You may shower or bathe with your pouch on or off. Remember that your stoma may function during this time. ° °The materials you use to wash your stoma and the skin around it should be clean, but they do not need to be sterile. ° °Wearing Your Pouch °During hot weather, or if you perspire a lot in general, wear a cover over your pouch. This may prevent a rash on your skin under the pouch. Pouch covers are sold at ostomy supply stores. °Wear the pouch inside your underwear for better support. °Watch your weight. Any gain or loss of 10 to 15 pounds or more can change the way your pouch fits. ° °Going Away From Home °A collapsible cup (like those that come in travel kits) or a soft plastic squirt bottle with a pull-up top (like a travel bottle for shampoo) can be used for rinsing your pouch when you are away from home. Tilt the opening of the pouch at an upward angle when using a cup to rinse. ° °Carry wet wipes or extra  tissues to use in public bathrooms. ° °Carry an extra pouching system with you at all times. ° °Never keep ostomy supplies in the glove compartment of your car. Extreme heat or cold can damage the skin barriers and adhesive wafers on the pouch. ° °When you travel, carry your ostomy supplies with you at all times. Keep them within easy reach. Do not pack ostomy supplies in baggage that will be checked or otherwise separated from you, because your baggage might be lost. If you’re traveling out of the country, it is helpful to have a letter stating that you are carrying ostomy supplies as a medical necessity. ° °If you need ostomy supplies while traveling, look in the yellow pages of the telephone book under “Surgical Supplies.” Or call the local ostomy organization to find out where supplies are available. ° °Do not let your ostomy supplies get low. Always order new pouches before you use the last one. ° °Reducing Odor °Limit foods such as broccoli, cabbage, onions, fish, and garlic in your diet to help reduce odor. °Each time you empty your pouch, carefully clean the opening of the pouch, both inside and outside, with toilet paper. °Rinse your pouch 1 or 2 times daily after you empty it (see directions for emptying your pouch and going away from home). °Add deodorant (such as Super Banish or Nullo) to your pouch. °Use air deodorizers in your bathroom. °Do not add aspirin to your pouch. Even though aspirin can help prevent odor, it could cause ulcers on your stoma. ° °When to call the doctor °Call the doctor if you have any of the following symptoms: °Purple, black,   or white stoma Severe cramps lasting more than 6 hours Severe watery discharge from the stoma lasting more than 6 hours No output from the colostomy for 3 days Excessive bleeding from your stoma Swelling of your stoma to more than 1/2-inch larger than usual Pulling inward of your stoma below skin level Severe skin irritation or deep ulcers Bulging  or other changes in your abdomen  When to call your ostomy nurse Call your ostomy/enterostomal therapy (ET) nurse if any of the following occurs: Frequent leaking of your pouching system Change in size or appearance of your stoma, causing discomfort or problems with your pouch Skin rash or rawness Weight gain or loss that causes problems with your pouch      FREQUENTLY ASKED QUESTIONS   Why havent you met any of these folks who have an ostomy?  Well, maybe you have! You just did not recognize them because an ostomy doesn't show. It can be kept secret if you wish. Why, maybe some of your best friends, office associates or neighbors have an ostomy ... you never can tell.   People facing ostomy surgery have many quality-of-life questions like:  Will you bulge? Smell? Make noises? Will you feel waste leaving your body? Will you be a captive of the toilet? Will you starve? Be a social outcast? Get/stay married? Have babies? Easily bathe, go swimming, bend over?  OK, lets look at what you can expect:   Will you bulge?  Remember, without part of the intestine or bladder, and its contents, you should have a flatter tummy than before. You can expect to wear, with little exception, what you wore before surgery ... and this in-cludes tight clothing and bathing suits.   Will you smell?  Today, thanks to modern odor proof pouching systems, you can walk into an ostomy support group meeting and not smell anything that is foul or offensive. And, for those with an ileostomy or colostomy who are concerned about odor when emptying their pouch, there are in-pouch deodorants that can be used to eliminate any waste odors that may exist.   Will you make noises?  Everyone produces gas, especially if they are an air-swallower. But intestinal sounds that occur from time to time are no differ-ent than a gurgling tummy, and quite often your clothing will muffle any sounds.   Will you feel the waste discharges?   For those with a colostomy or ileostomy there might be a slight pressure when waste leaves your body, but understand that the intestines have no nerve endings, so there will be no unpleasant sensations. Those with a urostomy will probably be unaware of any kidney drainage.   Will you be a captive of the toilet?  Immediately post-op you will spend more time in the bathroom than you will after your body recovers from surgery. Every person is different, but on average those with an ileostomy or urostomy may empty their pouches 4 to 6 times a day; a little  less if you have a colostomy. The average wear time between pouch system changes is 3 to 5 days and the changing process should take less than 30 minutes.   Will I need to be on a special diet? Most people return to their normal diet when they have recovered from surgery. Be sure to chew your food well, eat a well-balanced diet and drink plenty of fluids. If you experience problems with a certain food, wait a couple of weeks and try it again.  Will there be  odor and noises? °Pouching systems are designed to be odor-proof or odor-resistant. There are deodorants that can be used in the pouch. Medications are also available to help reduce odor. Limit gas-producing foods and carbonated beverages. You will experience less gas and fewer noises as you heal from surgery. ° °How much time will it take to care for my ostomy? °At first, you may spend a lot of time learning about your ostomy and how to take care of it. As you become more comfortable and skilled at changing the pouching system, it will take very little time to care for it.  ° °Will I be able to return to work? °People with ostomies can perform most jobs. As soon as you have healed from surgery, you should be able to return to work. Heavy lifting (more than 10 pounds) may be discouraged.  ° °What about intimacy? °Sexual relationships and intimacy are important and fulfilling aspects of your life. They  should continue after ostomy surgery. Intimacy-related concerns should be discussed openly between you and your partner.  ° °Can I wear regular clothing? °You do not need to wear special clothing. Ostomy pouches are fairly flat and barely noticeable. Elastic undergarments will not hurt the stoma or prevent the ostomy from functioning.  ° °Can I participate in sports? °An ostomy should not limit your involvement in sports. Many people with ostomies are runners, skiers, swimmers or participate in other active lifestyles. Talk with your caregiver first before doing heavy physical activity. ° °Will you starve?  °Not if you follow doctor’s orders at each stage of your post-op adjustment. There is no such thing as an “ostomy diet”. Some people with an ostomy will be able to eat and tolerate anything; others may find diffi-culty with some foods. Each person is an individual and must determine, by trial, what is best for them. A good practice for all is to drink plenty of water.  ° °Will you be a social outcast?  °Have you met anyone who has an ostomy and is a social outcast? Why should you be the first? Only your attitude and self image will effect how you are treated. No confi-dent person is an outcast.  ° ° ° °PROFESSIONAL HELP  °Resources are available if you need help or have questions about your ostomy.  ° °· Specially trained nurses called Wound, Ostomy Continence Nurses (WOCN) are available for consultation in most major medical centers. ° °· Consider getting an ostomy consult with Kathy Probst at [facility] to help troubleshoot stoma pouch fittings and other issues with your ostomy: [phone] ° °· The [employer] Ostomy Association (UOA) is a group made up of many local chapters throughout the [place]. These local groups hold meetings and provide support to prospective and existing ostomates. They sponsor educational events and have qualified visitors to make personal or telephone visits. Contact  the UOA for the chapter nearest you and for other educational publications. ° °· More detailed information can be found in Colostomy Guide, a publication of the United Ostomy Association (UOA). Contact UOA at 1-800-826-0826 or visit their web site at www.uoaa.org. The website contains links to other sites, suppliers and resources. ° °· [facility] Secure Start Services: °· Start at the website to enlist for support.  Your Wound Ostomy (WOCN) nurse may have started this process. [email or web address]://www.[facility].[email or web address]/securestart °· Secure Start services are designed to support people as they live their lives with an ostomy or neurogenic bladder. Enrolling is easy and at no cost to the   patient. We realize that each person's needs and life journey are different. Through Secure Start services, we want to help people live their life, their way. ° °SURGERY: POST OP INSTRUCTIONS °(Surgery for small bowel obstruction, colon resection, etc) ° ° °###################################################################### ° °EAT °Gradually transition to a high fiber diet with a fiber supplement over the next few days after discharge ° °WALK °Walk an hour a day.  Control your pain to do that.   ° °CONTROL PAIN °Control pain so that you can walk, sleep, tolerate sneezing/coughing, go up/down stairs. ° °HAVE A BOWEL MOVEMENT DAILY °Keep your bowels regular to avoid problems.  OK to try a laxative to override constipation.  OK to use an antidairrheal to slow down diarrhea.  Call if not better after 2 tries ° °CALL IF YOU HAVE PROBLEMS/CONCERNS °Call if you are still struggling despite following these instructions. °Call if you have concerns not answered by these instructions ° °###################################################################### ° ° °DIET °Follow a light diet the first few days at home.  Start with a bland diet such as soups, liquids, starchy foods, low fat foods, etc.  If you feel full, bloated, or constipated, stay on a ful  liquid or pureed/blenderized diet for a few days until you feel better and no longer constipated. °Be sure to drink plenty of fluids every day to avoid getting dehydrated (feeling dizzy, not urinating, etc.). °Gradually add a fiber supplement to your diet over the next week.  Gradually get back to a regular solid diet.  Avoid fast food or heavy meals the first week as you are more likely to get nauseated. °It is expected for your digestive tract to need a few months to get back to normal.  It is common for your bowel movements and stools to be irregular.  You will have occasional bloating and cramping that should eventually fade away.  Until you are eating solid food normally, off all pain medications, and back to regular activities; your bowels will not be normal. °Focus on eating a low-fat, high fiber diet the rest of your life (See Getting to Good Bowel Health, below). ° °CARE of your INCISION or WOUND °It is good for closed incision and even open wounds to be washed every day.  Shower every day.  Short baths are fine.  Wash the incisions and wounds clean with soap & water.    °If you have a closed incision(s), wash the incision with soap & water every day.  You may leave closed incisions open to air if it is dry.   You may cover the incision with clean gauze & replace it after your daily shower for comfort. °If you have skin tapes (Steristrips) or skin glue (Dermabond) on your incision, leave them in place.  They will fall off on their own like a scab.  You may trim any edges that curl up with clean scissors.  If you have staples, set up an appointment for them to be removed in the office in 10 days after surgery.  °If you have a drain, wash around the skin exit site with soap & water and place a new dressing of gauze or band aid around the skin every day.  Keep the drain site clean & dry.    °If you have an open wound with packing, see wound care instructions.  In general, it is encouraged that you remove your  dressing and packing, shower with soap & water, and replace your dressing once a day.  Pack the   wound with clean gauze moistened with normal (0.9%) saline to keep the wound moist & uninfected.  Pressure on the dressing for 30 minutes will stop most wound bleeding.  Eventually your body will heal & pull the open wound closed over the next few months.  °Raw open wounds will occasionally bleed or secrete yellow drainage until it heals closed.  Drain sites will drain a little until the drain is removed.  Even closed incisions can have mild bleeding or drainage the first few days until the skin edges scab over & seal.   °If you have an open wound with a wound vac, see wound vac care instructions. ° ° ° ° °ACTIVITIES as tolerated °Start light daily activities --- self-care, walking, climbing stairs-- beginning the day after surgery.  Gradually increase activities as tolerated.  Control your pain to be active.  Stop when you are tired.  Ideally, walk several times a day, eventually an hour a day.   °Most people are back to most day-to-day activities in a few weeks.  It takes 4-8 weeks to get back to unrestricted, intense activity. °If you can walk 30 minutes without difficulty, it is safe to try more intense activity such as jogging, treadmill, bicycling, low-impact aerobics, swimming, etc. °Save the most intensive and strenuous activity for last (Usually 4-8 weeks after surgery) such as sit-ups, heavy lifting, contact sports, etc.  Refrain from any intense heavy lifting or straining until you are off narcotics for pain control.  You will have off days, but things should improve week-by-week. °DO NOT PUSH THROUGH PAIN.  Let pain be your guide: If it hurts to do something, don't do it.  Pain is your body warning you to avoid that activity for another week until the pain goes down. °You may drive when you are no longer taking narcotic prescription pain medication, you can comfortably wear a seatbelt, and you can safely make  sudden turns/stops to protect yourself without hesitating due to pain. °You may have sexual intercourse when it is comfortable. If it hurts to do something, stop. ° °MEDICATIONS °Take your usually prescribed home medications unless otherwise directed.   °Blood thinners:  °Usually you can restart any strong blood thinners after the second postoperative day.  It is OK to take aspirin right away.    ° If you are on strong blood thinners (warfarin/Coumadin, Plavix, Xerelto, Eliquis, Pradaxa, etc), discuss with your surgeon, medicine PCP, and/or cardiologist for instructions on when to restart the blood thinner & if blood monitoring is needed (PT/INR blood check, etc).   ° ° °PAIN CONTROL °Pain after surgery or related to activity is often due to strain/injury to muscle, tendon, nerves and/or incisions.  This pain is usually short-term and will improve in a few months.  °To help speed the process of healing and to get back to regular activity more quickly, DO THE FOLLOWING THINGS TOGETHER: °1. Increase activity gradually.  DO NOT PUSH THROUGH PAIN °2. Use Ice and/or Heat °3. Try Gentle Massage and/or Stretching °4. Take over the counter pain medication °5. Take Narcotic prescription pain medication for more severe pain ° °Good pain control = faster recovery.  It is better to take more medicine to be more active than to stay in bed all day to avoid medications. °1.  Increase activity gradually °Avoid heavy lifting at first, then increase to lifting as tolerated over the next 6 weeks. °Do not “push through” the pain.  Listen to your body and avoid positions and maneuvers   than reproduce the pain.  Wait a few days before trying something more intense °Walking an hour a day is encouraged to help your body recover faster and more safely.  Start slowly and stop when getting sore.  If you can walk 30 minutes without stopping or pain, you can try more intense activity (running, jogging, aerobics, cycling, swimming, treadmill,  sex, sports, weightlifting, etc.) °Remember: If it hurts to do it, then don’t do it! °2. Use Ice and/or Heat °You will have swelling and bruising around the incisions.  This will take several weeks to resolve. °Ice packs or heating pads (6-8 times a day, 30-60 minutes at a time) will help sooth soreness & bruising. °Some people prefer to use ice alone, heat alone, or alternate between ice & heat.  Experiment and see what works best for you.  Consider trying ice for the first few days to help decrease swelling and bruising; then, switch to heat to help relax sore spots and speed recovery. °Shower every day.  Short baths are fine.  It feels good!  Keep the incisions and wounds clean with soap & water.   °3. Try Gentle Massage and/or Stretching °Massage at the area of pain many times a day °Stop if you feel pain - do not overdo it °4. Take over the counter pain medication °This helps the muscle and nerve tissues become less irritable and calm down faster °Choose ONE of the following over-the-counter anti-inflammatory medications: °Acetaminophen 500mg tabs (Tylenol) 1-2 pills with every meal and just before bedtime (avoid if you have liver problems or if you have acetaminophen in you narcotic prescription) °Naproxen 220mg tabs (ex. Aleve, Naprosyn) 1-2 pills twice a day (avoid if you have kidney, stomach, IBD, or bleeding problems) °Ibuprofen 200mg tabs (ex. Advil, Motrin) 3-4 pills with every meal and just before bedtime (avoid if you have kidney, stomach, IBD, or bleeding problems) °Take with food/snack several times a day as directed for at least 2 weeks to help keep pain / soreness down & more manageable. °5. Take Narcotic prescription pain medication for more severe pain °A prescription for strong pain control is often given to you upon discharge (for example: oxycodone/Percocet, hydrocodone/Norco/Vicodin, or tramadol/Ultram) °Take your pain medication as prescribed. °Be mindful that most narcotic prescriptions  contain Tylenol (acetaminophen) as well - avoid taking too much Tylenol. °If you are having problems/concerns with the prescription medicine (does not control pain, nausea, vomiting, rash, itching, etc.), please call us [phone] to see if we need to switch you to a different pain medicine that will work better for you and/or control your side effects better. °If you need a refill on your pain medication, you must call the office before 4 pm and on weekdays only.  By federal law, prescriptions for narcotics cannot be called into a pharmacy.  They must be filled out on paper & picked up from our office by the patient or authorized caretaker.  Prescriptions cannot be filled after 4 pm nor on weekends.   ° °WHEN TO CALL US [phone] °Severe uncontrolled or worsening pain  °Fever over 101 F (38.5 C) °Concerns with the incision: Worsening pain, redness, rash/hives, swelling, bleeding, or drainage °Reactions / problems with new medications (itching, rash, hives, nausea, etc.) °Nausea and/or vomiting °Difficulty urinating °Difficulty breathing °Worsening fatigue, dizziness, lightheadedness, blurred vision °Other concerns °If you are not getting better after two weeks or are noticing you are getting worse, contact our office [phone] for further advice.  We may   need to adjust your medications, re-evaluate you in the office, send you to the emergency room, or see what other things we can do to help. °The clinic staff is available to answer your questions during regular business hours (8:30am-5pm).  Please don’t hesitate to call and ask to speak to one of our nurses for clinical concerns.    °A surgeon from [facility] is always on call at the hospitals 24 hours/day °If you have a medical emergency, go to the nearest emergency room or call 911. ° °FOLLOW UP in our office °One the day of your discharge from the hospital (or the next business weekday), please call [facility] to set up  or confirm an appointment to see your surgeon in the office for a follow-up appointment.  Usually it is 2-3 weeks after your surgery.   °If you have skin staples at your incision(s), let the office know so we can set up a time in the office for the nurse to remove them (usually around 10 days after surgery). °Make sure that you call for appointments the day of discharge (or the next business weekday) from the hospital to ensure a convenient appointment time. °IF YOU HAVE DISABILITY OR FAMILY LEAVE FORMS, BRING THEM TO THE OFFICE FOR PROCESSING.  DO NOT GIVE THEM TO YOUR DOCTOR. ° °[facility] °[address], [address], [place], [place]  [address] ? °[phone] - Main °[phone] - Toll Free,  [phone] - Fax °[email or web address] ° °GETTING TO GOOD BOWEL HEALTH. °It is expected for your digestive tract to need a few months to get back to normal.  It is common for your bowel movements and stools to be irregular.  You will have occasional bloating and cramping that should eventually fade away.  Until you are eating solid food normally, off all pain medications, and back to regular activities; your bowels will not be normal.   °Avoiding constipation °The goal: ONE SOFT BOWEL MOVEMENT A DAY!    °Drink plenty of fluids.  Choose water first. °TAKE A FIBER SUPPLEMENT EVERY DAY THE REST OF YOUR LIFE °During your first week back home, gradually add back a fiber supplement every day °Experiment which form you can tolerate.   There are many forms such as powders, tablets, wafers, gummies, etc °Psyllium bran (Metamucil), methylcellulose (Citrucel), Miralax or Glycolax, Benefiber, Flax Seed.  °Adjust the dose week-by-week (1/2 dose/day to 6 doses a day) until you are moving your bowels 1-2 times a day.  Cut back the dose or try a different fiber product if it is giving you problems such as diarrhea or bloating. °Sometimes a laxative is needed to help jump-start bowels if constipated  until the fiber supplement can help regulate your bowels.  If you are tolerating eating & you are farting, it is okay to try a gentle laxative such as double dose MiraLax, prune juice, or Milk of Magnesia.  Avoid using laxatives too often. °Stool softeners can sometimes help counteract the constipating effects of narcotic pain medicines.  It can also cause diarrhea, so avoid using for too long. °If you are still constipated despite taking fiber daily, eating solids, and a few doses of laxatives, call our office. °Controlling diarrhea °Try drinking liquids and eating bland foods for a few days to avoid stressing your intestines further. °Avoid dairy products (especially milk & ice cream) for a short time.  The intestines often can lose the ability to digest lactose when stressed. °Avoid foods that   cause gassiness or bloating.  Typical foods include beans and other legumes, cabbage, broccoli, and dairy foods.  Avoid greasy, spicy, fast foods.  Every person has some sensitivity to other foods, so listen to your body and avoid those foods that trigger problems for you. °Probiotics (such as active yogurt, Align, etc) may help repopulate the intestines and colon with normal bacteria and calm down a sensitive digestive tract °Adding a fiber supplement gradually can help thicken stools by absorbing excess fluid and retrain the intestines to act more normally.  Slowly increase the dose over a few weeks.  Too much fiber too soon can backfire and cause cramping & bloating. °It is okay to try and slow down diarrhea with a few doses of antidiarrheal medicines.   °Bismuth subsalicylate (ex. Kayopectate, Pepto Bismol) for a few doses can help control diarrhea.  Avoid if pregnant.   °Loperamide (Imodium) can slow down diarrhea.  Start with one tablet (2mg) first.  Avoid if you are having fevers or severe pain.  °ILEOSTOMY PATIENTS WILL HAVE CHRONIC DIARRHEA since their colon is not in use.    °Drink plenty of liquids.  You will  need to drink even more glasses of water/liquid a day to avoid getting dehydrated. °Record output from your ileostomy.  Expect to empty the bag every 3-4 hours at first.  Most people with a permanent ileostomy empty their bag 4-6 times at the least.   °Use antidiarrheal medicine (especially Imodium) several times a day to avoid getting dehydrated.  Start with a dose at bedtime & breakfast.  Adjust up or down as needed.  Increase antidiarrheal medications as directed to avoid emptying the bag more than 8 times a day (every 3 hours). °Work with your wound ostomy nurse to learn care for your ostomy.  See ostomy care instructions. °TROUBLESHOOTING IRREGULAR BOWELS °1) Start with a soft & bland diet. No spicy, greasy, or fried foods.  °2) Avoid gluten/wheat or dairy products from diet to see if symptoms improve. °3) Miralax 17gm or flax seed mixed in 8oz. water or juice-daily. May use 2-4 times a day as needed. °4) Gas-X, Phazyme, etc. as needed for gas & bloating.  °5) Prilosec (omeprazole) over-the-counter as needed °6)  Consider probiotics (Align, Activa, etc) to help calm the bowels down ° °Call your doctor if you are getting worse or not getting better.  Sometimes further testing (cultures, endoscopy, X-ray studies, CT scans, bloodwork, etc.) may be needed to help diagnose and treat the cause of the diarrhea. °[facility] °[address], [address], [place], [place]  [address] °[phone] - Main.    °[phone]  - Toll Free.   [phone] - Fax °[email or web address] ° °

## 2017-02-23 NOTE — Care Management Note (Signed)
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 States his BCBS is through the Affordable Care. Pt states he will have 10 bags to dc home. States he can pay for two visit for [facility] RN out of pocket. Made [employer] aware that pt wanted to pay for 2 visits for [facility]. Waiting for call back to confirm acceptance. Contacted RN WOC, Margarita Grizzle to follow up on [employer] to see if they have a patient assistance program.   PCP Robyn Haber MD  Expected Discharge Date:  02/23/17               Expected Discharge Plan:  [place]  In-House Referral:  NA  Discharge planning Services  CM Consult  Post Acute Care Choice:  Home Health Choice offered to:  Patient  DME Arranged:  N/A DME Agency:  NA  HH Arranged:  RN [place] Agency:  [facility]  Status of Service:  Completed, signed off  If discussed at [place] of Stay Meetings, dates discussed:    Additional Comments:  Erenest Rasher, RN 02/23/2017, 3:33 PM

## 2017-02-23 NOTE — Progress Notes (Signed)
Pt took the doctor's offer to stay another day because he did not feel he was ready for d/c just yet as he had to get his home health needs worked out and wanted more teaching for his colostomy.  Pt is tolerating regular diet and has ambulated twice in the hallway today.

## 2017-02-23 NOTE — Progress Notes (Signed)
[facility]  [address] [place], [place] [address] Phone: [phone]  FAX: [phone]      David Clarke [id number] 19-Sep-1955  CARE TEAM:  PCP: Robyn Haber, MD  Outpatient Care Team: Patient Care Team: Robyn Haber, MD as PCP - General (Family Medicine) Michael Boston, MD as Consulting Physician (General Surgery) Pyrtle, Lajuan Lines, MD as Consulting Physician (Gastroenterology) Truitt Merle, MD as Consulting Physician (Hematology and Oncology) Kyung Rudd, MD as Consulting Physician (Radiation Oncology)  Inpatient Treatment Team: Treatment Team: Attending Provider: Michael Boston, MD; Technician: Etheleen Sia, NT; Registered Nurse: Arminda Resides, RN   Problem List:   Principal Problem:   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 Active Problems:   Family history of hemochromatosis   Iron deficiency anemia due to chronic blood loss   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)   2 Days Post-Op  02/21/2017  Procedure(s): XI ROBOT ABDOMINOPERINEAL RESECTION WITH COLOSTOMY   Assessment  Recovering  Plan:  -adv diet to solids -colostomy care - WOCN seeing -nausea control options - less of an issue now -f/u pathology -follow Hgb -VTE prophylaxis- SCDs, etc -mobilize as tolerated to help recovery  D/C patient from hospital when patient meets criteria (anticipate in 0-2 day(s)):  Tolerating oral intake well Ambulating well Adequate pain control without IV medications Urinating  Having flatus Ostomy care/training OK Disposition planning in place   I updated the patient's status to the patient and nurse.  Recommendations were made.  Questions were answered.  They expressed understanding & appreciation.  20 minutes spent in review, evaluation, examination, counseling, and coordination of care.  More than 50% of that time was spent in counseling.  [name], M.D., F.A.C.S. Gastrointestinal and  Minimally Invasive Surgery [facility] [address] [place], [place] [address] [phone] Main / Paging   02/23/2017    Subjective: (Chief complaint)  Tol puered - wants solids Walking Foley just out Good visit with WOCN   Objective:  Vital signs:  Vitals:   02/22/17 1724 02/22/17 2111 02/23/17 0223 02/23/17 0619  BP: (!) 103/59 102/63  125/68  Pulse: 67 64  71  Resp: 18 18  18   Temp: 98.2 F (36.8 C) 98.3 F (36.8 C)  98.4 F (36.9 C)  TempSrc: Oral Oral  Oral  SpO2: 99% 97%  98%  Weight:   67.1 kg (147 lb 14.9 oz)   Height:        Last BM Date: 02/22/17  Intake/Output   Yesterday:  06/28 0701 - 06/29 0700 In: 87 [P.O.:660] Out: 2710 [Urine:2200; Drains:210; Stool:300] This shift:  Total I/O In: 600 [P.O.:600] Out: 1370 [Urine:1200; Drains:70; Stool:100]  Bowel function:  Flatus: YES  BM:  YES  Drain: Serosanguinous   Physical Exam:  General: Pt awake/alert/oriented x4 in no acute distress Eyes: PERRL, normal EOM.  Sclera clear.  No icterus Neuro: CN II-XII intact w/o focal sensory/motor deficits. Lymph: No head/neck/groin lymphadenopathy Psych:  No delerium/psychosis/paranoia HENT: Normocephalic, Mucus membranes moist.  No thrush Neck: Supple, No tracheal deviation Chest: No chest wall pain w good excursion CV:  Pulses intact.  Regular rhythm MS: Normal AROM mjr joints.  No obvious deformity  Abdomen: Soft.  Nondistended.  Mildly tender at incisions only.  L colostomy pink with gas & stool.  No evidence of peritonitis.  No incarcerated hernias.  GU:  NEMG.  R>L BIH but reducible.   Rectal:  Perineal closure with mild moisture Ext:  No  deformity.  No mjr edema.  No cyanosis Skin: No petechiae / purpura  Results:   Labs: Results for orders placed or performed during the hospital encounter of 02/21/17 (from the past 48 hour(s))  Basic metabolic panel     Status: Abnormal   Collection Time:  02/22/17  4:09 AM  Result Value Ref Range   Sodium 138 135 - 145 mmol/L   Potassium 4.2 3.5 - 5.1 mmol/L   Chloride 105 101 - 111 mmol/L   CO2 27 22 - 32 mmol/L   Glucose, Bld 111 (H) 65 - 99 mg/dL   BUN 10 6 - 20 mg/dL   Creatinine, Ser 0.72 0.61 - 1.24 mg/dL   Calcium 8.4 (L) 8.9 - 10.3 mg/dL   GFR calc non Af Amer >60 >60 mL/min   GFR calc Af Amer >60 >60 mL/min    Comment: (NOTE) The eGFR has been calculated using the CKD EPI equation. This calculation has not been validated in all clinical situations. eGFR's persistently <60 mL/min signify possible Chronic Kidney Disease.    Anion gap 6 5 - 15  CBC     Status: Abnormal   Collection Time: 02/22/17  4:09 AM  Result Value Ref Range   WBC 9.8 4.0 - 10.5 K/uL   RBC 3.20 (L) 4.22 - 5.81 MIL/uL   Hemoglobin 8.9 (L) 13.0 - 17.0 g/dL   HCT 28.7 (L) 39.0 - 52.0 %   MCV 89.7 78.0 - 100.0 fL   MCH 27.8 26.0 - 34.0 pg   MCHC 31.0 30.0 - 36.0 g/dL   RDW 14.7 11.5 - 15.5 %   Platelets 342 150 - 400 K/uL  Magnesium     Status: None   Collection Time: 02/22/17  4:09 AM  Result Value Ref Range   Magnesium 2.0 1.7 - 2.4 mg/dL    Imaging / Studies: No results found.  Medications / Allergies: per chart  Antibiotics: Anti-infectives    Start     Dose/Rate Route Frequency Ordered Stop   02/21/17 2000  cefoTEtan (CEFOTAN) 2 g in dextrose 5 % 50 mL IVPB     2 g 100 mL/hr over 30 Minutes Intravenous Every 12 hours 02/21/17 1429 02/21/17 2046   02/21/17 1224  clindamycin (CLEOCIN) 900 mg, gentamicin (GARAMYCIN) 240 mg in sodium chloride 0.9 % 1,000 mL for intraperitoneal lavage  Status:  Discontinued       As needed 02/21/17 1224 02/21/17 1248   02/21/17 0745  clindamycin (CLEOCIN) 900 mg, gentamicin (GARAMYCIN) 240 mg in sodium chloride 0.9 % 1,000 mL for intraperitoneal lavage  Status:  Discontinued      Intraperitoneal To Surgery 02/20/17 1717 02/21/17 1419   02/21/17 0647  cefoTEtan (CEFOTAN) 2 g in dextrose 5 % 50 mL IVPB     2  g 100 mL/hr over 30 Minutes Intravenous On call to O.R. 02/21/17 7106 02/21/17 2694        Note: Portions of this report may have been transcribed using voice recognition software. Every effort was made to ensure accuracy; however, inadvertent computerized transcription errors may be present.   Any transcriptional errors that result from this process are unintentional.     [name], M.D., F.A.C.S. Gastrointestinal and Minimally Invasive Surgery [facility] [address] [place], [place] [address] [phone] Main / Paging   02/23/2017

## 2017-02-24 NOTE — Progress Notes (Signed)
Patient ID: David Clarke, male   DOB: [birth year], 61 y.o.   MRN: [id number] [facility] Progress Note:   3 Days Post-Op  Subjective: Mental status is clear.  Taking some outside foods Objective: Vital signs in last 24 hours: Temp:  [97.7 F (36.5 C)-98.5 F (36.9 C)] 97.7 F (36.5 C) (06/30 0610) Pulse Rate:  [64-65] 64 (06/30 0610) Resp:  [16-18] 16 (06/30 0610) BP: (104-120)/(64-69) 115/67 (06/30 0610) SpO2:  [98 %-100 %] 98 % (06/30 0610)  Intake/Output from previous day: 06/29 0701 - 06/30 0700 In: 1100 [P.O.:1050] Out: 908 [Urine:650; Drains:108; Stool:150] Intake/Output this shift: No intake/output data recorded.  Physical Exam: Work of breathing is normal.  Pad removed from bottom and wics removed from midline perineal closure.  JP with bloody drainage.    Lab Results:  No results found for this or any previous visit (from the past 48 hour(s)).  Radiology/Results: No results found.  Anti-infectives: Anti-infectives    Start     Dose/Rate Route Frequency Ordered Stop   02/21/17 2000  cefoTEtan (CEFOTAN) 2 g in dextrose 5 % 50 mL IVPB     2 g 100 mL/hr over 30 Minutes Intravenous Every 12 hours 02/21/17 1429 02/21/17 2046   02/21/17 1224  clindamycin (CLEOCIN) 900 mg, gentamicin (GARAMYCIN) 240 mg in sodium chloride 0.9 % 1,000 mL for intraperitoneal lavage  Status:  Discontinued       As needed 02/21/17 1224 02/21/17 1248   02/21/17 0745  clindamycin (CLEOCIN) 900 mg, gentamicin (GARAMYCIN) 240 mg in sodium chloride 0.9 % 1,000 mL for intraperitoneal lavage  Status:  Discontinued      Intraperitoneal To Surgery 02/20/17 1717 02/21/17 1419   02/21/17 0647  cefoTEtan (CEFOTAN) 2 g in dextrose 5 % 50 mL IVPB     2 g 100 mL/hr over 30 Minutes Intravenous On call to O.R. 02/21/17 0647 02/21/17 0918      Assessment/Plan: Problem List: Patient Active Problem List   Diagnosis Date Noted  . 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 Fever   . Dehydration    . Diarrhea   . 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 Hypokalemia 12/03/2016  . Leukopenia 12/03/2016  . Prolonged Q-T interval on ECG 12/03/2016  . Oral thrush 12/03/2016  . Hemochromatosis 11/19/2016  . Family history of hemochromatosis 10/25/2016  . Iron deficiency anemia due to chronic blood loss 10/25/2016  . 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  Will get him up more and hopeful discharge tomorrow.   3 Days Post-Op    LOS: 3 days   Matt B. Hassell Done, MD, [facility] [phone] beeper [phone]  02/24/2017 11:41 AM

## 2017-02-24 NOTE — Progress Notes (Signed)
Contacted [facility] HH rep and she will arrange for [facility] to visit and pt to pay out of pocket. Jonnie Finner RN CCM Case Mgmt phone [phone]

## 2017-02-24 NOTE — Progress Notes (Signed)
Pharmacy Brief Note - Alvimopan (Entereg)  The standing order set for alvimopan (Entereg) now includes an automatic order to discontinue the drug after the patient has had a bowel movement.  The change was approved by the [place] and the Medical Executive Committee.    This patient has had a bowel movement documented by nursing.  Therefore, alvimopan has been discontinued.  If there are questions, please contact the pharmacy at [phone].  Thank you- Biagio Borg, [facility] 02/24/2017 10:52 AM

## 2017-02-25 NOTE — Discharge Summary (Signed)
Physician Discharge Summary  Patient ID: David Clarke MRN: [id number] DOB/AGE: [id number] 62 y.o.  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 Johney Maine.  Did well postop with regain of bowel function; ostomy instruction by Ms. McNichol, and care for his perineum.  JP was removed on Sunday, July 1 prior to discharge.    Consults: none  Significant Diagnostic Studies: path pending    Discharge Exam: Blood pressure 125/77, pulse 65, temperature 98.4 F (36.9 C), temperature source Oral, resp. rate 16, height 6' (1.829 m), weight 63.3 kg (139 lb 9.6 oz), SpO2 98 %. Incisions OK,  Wicks removed from bottom  Disposition: 01-Home or Self Care  Discharge Instructions    Call MD for:    Complete by:  As directed    FEVER > 101.5 F  (temperatures < 101.5 F are not significant)   Call MD for:    Complete by:  As directed    FEVER > 101.5 F  (temperatures < 101.5 F are not significant)   Call MD for:  extreme fatigue    Complete by:  As directed    Call MD for:  extreme fatigue    Complete by:  As directed    Call MD for:  persistant dizziness or light-headedness    Complete by:  As directed    Call MD for:  persistant dizziness or light-headedness    Complete by:  As directed    Call MD for:  persistant nausea and vomiting    Complete by:  As directed    Call MD for:  persistant nausea and vomiting    Complete by:  As directed    Call MD for:  persistant nausea and vomiting    Complete by:  As directed    Call MD for:  redness, tenderness, or signs of infection (pain,  swelling, redness, odor or green/yellow discharge around incision site)    Complete by:  As directed    Call MD for:  redness, tenderness, or signs of infection (pain, swelling, redness, odor or green/yellow discharge around incision site)    Complete by:  As directed    Call MD for:  redness, tenderness, or signs of infection (pain, swelling, redness, odor or green/yellow discharge around incision site)    Complete by:  As directed    Call MD for:  severe uncontrolled pain    Complete by:  As directed    Call MD for:  severe uncontrolled pain    Complete by:  As directed    Call MD for:  severe uncontrolled pain    Complete by:  As directed    Call MD for:  temperature >100.4    Complete by:  As directed    Diet - low sodium heart healthy    Complete by:  As directed    Follow a light diet the first few days at home.   Start with a bland diet such as soups, liquids, starchy foods, low fat foods, etc.   If you feel full, bloated, or constipated, stay on a full liquid or pureed/blenderized diet for a few days  until you feel better and no longer constipated. Be sure to drink plenty of fluids every day to avoid getting dehydrated (feeling dizzy, not urinating, etc.). Gradually add a fiber supplement to your diet   Diet - low sodium heart healthy    Complete by:  As directed    Follow a light diet the first few days at home.   Start with a bland diet such as soups, liquids, starchy foods, low fat foods, etc.   If you feel full, bloated, or constipated, stay on a full liquid or pureed/blenderized diet for a few days until you feel better and no longer constipated. Be sure to drink plenty of fluids every day to avoid getting dehydrated (feeling dizzy, not urinating, etc.). Gradually add a fiber supplement to your diet   Diet - low sodium heart healthy    Complete by:  As directed    Discharge instructions    Complete by:  As directed    See Discharge Instructions If you are not getting  better after two weeks or are noticing you are getting worse, contact our office [phone] for further advice.  We may need to adjust your medications, re-evaluate you in the office, send you to the emergency room, or see what other things we can do to help. The clinic staff is available to answer your questions during regular business hours (8:30am-5pm).  Please don't hesitate to call and ask to speak to one of our nurses for clinical concerns.    A surgeon from [facility] is always on call at the hospitals 24 hours/day If you have a medical emergency, go to the nearest emergency room or call 911.   Discharge instructions    Complete by:  As directed    See Discharge Instructions If you are not getting better after two weeks or are noticing you are getting worse, contact our office [phone] for further advice.  We may need to adjust your medications, re-evaluate you in the office, send you to the emergency room, or see what other things we can do to help. The clinic staff is available to answer your questions during regular business hours (8:30am-5pm).  Please don't hesitate to call and ask to speak to one of our nurses for clinical concerns.    A surgeon from [facility] is always on call at the hospitals 24 hours/day If you have a medical emergency, go to the nearest emergency room or call 911.   Driving Restrictions    Complete by:  As directed    You may drive when you are no longer taking narcotic prescription pain medication, you can comfortably wear a seatbelt, and you can safely make sudden turns/stops to protect yourself without hesitating due to pain.   Driving Restrictions    Complete by:  As directed    You may drive when you are no longer taking narcotic prescription pain medication, you can comfortably wear a seatbelt, and you can safely make sudden turns/stops to protect yourself without hesitating due to pain.   Increase activity slowly     Complete by:  As directed    Start light daily activities --- self-care, walking, climbing stairs- beginning the day after surgery.  Gradually increase activities as tolerated.  Control your pain to be active.  Stop when you are tired.  Ideally, walk several times a day, eventually an hour a day.   Most people are back to most day-to-day activities in a few weeks.  It takes 4-8 weeks to get back to unrestricted, intense activity. If you can walk 30 minutes without difficulty, it is safe to try more intense activity such as jogging, treadmill, bicycling, low-impact aerobics, swimming, etc. Save the most intensive and strenuous activity for last (Usually 4-8 weeks after surgery) such as sit-ups, heavy lifting, contact sports, etc.  Refrain from any intense heavy lifting or straining until you are off narcotics for pain control.  You will have off days, but things should improve week-by-week. DO NOT PUSH THROUGH PAIN.  Let pain be your guide: If it hurts to do something, don't do it.  Pain is your body warning you to avoid that activity for another week until the pain goes down.   Increase activity slowly    Complete by:  As directed    Start light daily activities --- self-care, walking, climbing stairs- beginning the day after surgery.  Gradually increase activities as tolerated.  Control your pain to be active.  Stop when you are tired.  Ideally, walk several times a day, eventually an hour a day.   Most people are back to most day-to-day activities in a few weeks.  It takes 4-8 weeks to get back to unrestricted, intense activity. If you can walk 30 minutes without difficulty, it is safe to try more intense activity such as jogging, treadmill, bicycling, low-impact aerobics, swimming, etc. Save the most intensive and strenuous activity for last (Usually 4-8 weeks after surgery) such as sit-ups, heavy lifting, contact sports, etc.  Refrain from any intense heavy lifting or straining until you are off  narcotics for pain control.  You will have off days, but things should improve week-by-week. DO NOT PUSH THROUGH PAIN.  Let pain be your guide: If it hurts to do something, don't do it.  Pain is your body warning you to avoid that activity for another week until the pain goes down.   Increase activity slowly    Complete by:  As directed    Lifting restrictions    Complete by:  As directed    If you can walk 30 minutes without difficulty, it is safe to try more intense activity such as jogging, treadmill, bicycling, low-impact aerobics, swimming, etc. Save the most intensive and strenuous activity for last (Usually 4-8 weeks after surgery) such as sit-ups, heavy lifting, contact sports, etc.  Refrain from any intense heavy lifting or straining until you are off narcotics for pain control.  You will have off days, but things should improve week-by-week. DO NOT PUSH THROUGH PAIN.  Let pain be your guide: If it hurts to do something, don't do it.  Pain is your body warning you to avoid that activity for another week until the pain goes down.   Lifting restrictions    Complete by:  As directed    If you can walk 30 minutes without difficulty, it is safe to try more intense activity such as jogging, treadmill, bicycling, low-impact aerobics, swimming, etc. Save the most intensive and strenuous activity for last (Usually 4-8 weeks after surgery) such as sit-ups, heavy lifting, contact sports, etc.  Refrain from any intense heavy lifting or straining until you are off narcotics for pain control.  You will have off days, but things should improve week-by-week. DO NOT PUSH THROUGH PAIN.  Let pain be your guide: If it hurts to do something, don't do it.  Pain is your body warning you to avoid that activity for another week until the pain goes down.  May walk up steps    Complete by:  As directed    May walk up steps    Complete by:  As directed    No wound care    Complete by:  As directed    It is good  for closed incision and even open wounds to be washed every day.  Shower every day.  Short baths are fine.  Wash the incisions and wounds clean with soap & water.    If you have a closed incision(s), wash the incision with soap & water every day.  You may leave closed incisions open to air if it is dry.   You may cover the incision with clean gauze & replace it after your daily shower for comfort. If you have skin tapes (Steristrips) or skin glue (Dermabond) on your incision, leave them in place.  They will fall off on their own like a scab.  You may trim any edges that curl up with clean scissors.  If you have staples, set up an appointment for them to be removed in the office in 10 days after surgery.  If you have a drain, wash around the skin exit site with soap & water and place a new dressing of gauze or band aid around the skin every day.  Keep the drain site clean & dry.   No wound care    Complete by:  As directed    It is good for closed incision and even open wounds to be washed every day.  Shower every day.  Short baths are fine.  Wash the incisions and wounds clean with soap & water.    If you have a closed incision(s), wash the incision with soap & water every day.  You may leave closed incisions open to air if it is dry.   You may cover the incision with clean gauze & replace it after your daily shower for comfort.   Sexual Activity Restrictions    Complete by:  As directed    You may have sexual intercourse when it is comfortable. If it hurts to do something, stop.   Sexual Activity Restrictions    Complete by:  As directed    You may have sexual intercourse when it is comfortable. If it hurts to do something, stop.     Allergies as of 02/25/2017      Reactions   Sulfa Antibiotics Nausea Only   Reaction may years ago, pt thinks nausea only.       Medication List    STOP taking these medications   fluconazole 100 MG tablet Commonly known as:  DIFLUCAN   metroNIDAZOLE 500 MG  tablet Commonly known as:  FLAGYL   neomycin 500 MG tablet Commonly known as:  MYCIFRADIN   potassium chloride SA 20 MEQ tablet Commonly known as:  K-DUR,KLOR-CON     TAKE these medications   gabapentin 300 MG capsule Commonly known as:  NEURONTIN Take 1 capsule (300 mg total) by mouth at bedtime.   magic mouthwash w/lidocaine Soln Take 5 mLs by mouth 4 (four) times daily as needed for mouth pain. Swish and Swallow  Or   Swish and Spit.   methocarbamol 500 MG tablet Commonly known as:  ROBAXIN Take 1-2 tablets (500-1,000 mg total) by mouth every 8 (eight) hours as needed for muscle spasms.   MUGARD Liqd Use as directed 5 mLs in the mouth or throat every 4 (four) hours as needed.   multivitamin with minerals Tabs  tablet Take 1 tablet by mouth 3 (three) times a week.   traMADol 50 MG tablet Commonly known as:  ULTRAM Take 1-2 tablets (50-100 mg total) by mouth every 6 (six) hours as needed for moderate pain or severe pain.      Follow-up Information    Schedule an appointment as soon as possible for a visit in 2 week(s) to follow up.   Why:  To follow up after your operation, To follow up after your hospital stay       Michael Boston, MD. Schedule an appointment as soon as possible for a visit in 2 week(s).   Specialty:  General Surgery Contact information: [address] [place] [address] [place], [place] Follow up.   Specialty:  Home Health Services Why:  Home Health RN Contact information: [address] [place] [address] [phone]           Signed: Pedro Earls 02/25/2017, 10:03 AM

## 2017-02-25 NOTE — Progress Notes (Signed)
Pt is discharged to home. DC instructions given with male family member at bedside. No concerns voiced. Prescription x 1 given for pain med. Pt and family reminded to stop by [facility] and pick up med that was e-prescribed by MD. Both acknowledged same. JP drain pulled. Tubing  Intact. No bleeding or bruising noted to site.  Gauze and tape applied to site. Left unit in wheelchair pushed by nurse tech accompanied by two male family members. No concerns voiced. Hale Bogus.

## 2017-03-02 NOTE — Anesthesia Procedure Notes (Signed)
Arterial Line Insertion Performed by: Lyndle Herrlich, anesthesiologist  Patient location: OR. Left, radial was placed Catheter size: 20 G  Attempts: 1 Procedure performed without using ultrasound guided technique. Following insertion, dressing applied and Biopatch. Post procedure assessment: unchanged  Patient tolerated the procedure well with no immediate complications.

## 2017-03-02 NOTE — Addendum Note (Signed)
Addendum  created 03/02/17 1308 by Lyndle Herrlich, MD   Anesthesia Intra Blocks edited, Child order released for a procedure order, LDA created via procedure documentation, Sign clinical note

## 2017-03-05 ENCOUNTER — Encounter: Payer: Self-pay | Admitting: *Deleted

## 2017-03-09 NOTE — Progress Notes (Signed)
David Clarke  [phone] [phone]  Clinic Follow Up Note   Patient Care Team: Robyn Haber, MD as PCP - General (Family Medicine) Michael Boston, MD as Consulting Physician (General Surgery) Pyrtle, Lajuan Lines, MD as Consulting Physician (Gastroenterology) Truitt Merle, MD as Consulting Physician (Hematology and Oncology) Kyung Rudd, MD as Consulting Physician (Radiation Oncology) 03/14/2017  CHIEF COMPLAINTS:  Rectal adenocarcinoma  Oncology History   Cancer Staging Rectal adenocarcinoma  Staging form: Colon and Rectum, AJCC 8th Edition - Clinical stage from 10/09/2016: Stage IIIB (cT3, cN1b, cM0) - Signed by Truitt Merle, MD on 10/25/2016       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      10/09/2016 Initial Diagnosis    Rectal adenocarcinoma       10/09/2016 Procedure    Colonoscopy showed a fungating, infiltrative and ulcerated partially obstructing large mass in the distal rectum, measuring 9 cm, prolapsing at anal canal. Scope not advanced proximal to the rectosigmoid colon.      10/09/2016 Initial Biopsy    Rectal mass biopsy showed invasive adenocarcinoma.      10/13/2016 Imaging    CT Chest Abdomen Pelvis w contrast IMPRESSION: Large irregular enhancing rectal mass consistent with known rectal cancer. There are small perirectal and small pelvic sidewall lymph nodes which are suspicious. Prominent perirectal vessels are also noted. No evidence of metastatic retroperitoneal or sigmoid mesocolon adenopathy or hepatic or pulmonary metastasis.      10/24/2016 Imaging    MRI Pelvis IMPRESSION: 1. Moderately motion degraded exam. 2. Large mass is centered at the anus with extension into the low rectum. No complicating obstruction. 3. Mild mesorectal adenopathy,  suspicious. 4. Right inguinal hernia containing nonobstructive small bowel.       11/08/2016 - 12/19/2016 Chemotherapy    Xeloda 50 mg twice daily, with concurrent radiation, held since 12/03/2016 due to colitis and hospitalization      11/08/2016 - 12/19/2016 Radiation Therapy    Neoadjuvant radiation to his rectal cancer  1) Pelvis/ 45 Gy in 25 fractions  2) Rectum boost/ 9 Gy in 5 fractions Under the care of Dr. Lisbeth Renshaw.      12/03/2016 - 12/09/2016 Hospital Admission    He presented with few days to one week history of diarrhea with 6-7 loose stools daily, along with a fever of 101.5 on the day of admission. Patient was also noted to be hypokalemic, hyponatremic. Hospitalized for further management. Stool studies were sent. C. difficile was negative. GI pathogen panel was positive for Yersinia.  XELODA was held at this time, until infection has cleared.       12/07/2016 Imaging    CT A/P W Contrast  IMPRESSION: 1. Abnormal dilatation of the proximal small bowel loops worrisome for either small bowel obstruction versus small bowel ileus. 2. 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 3. Large right inguinal hernia containing edematous loop of distal small bowel 4. No pneumatosis or bowel perforation.  No abscess. 5. Aortic atherosclerosis 6. Mild decrease in size of rectal mass. No change in right common iliac adenopathy.      02/21/2017 Surgery    XI ROBOT ABDOMINOPERINEAL RESECTION WITH COLOSTOMY by Dr. Johney Maine      02/21/2017 Pathology Results    Diagnosis  02/21/17 1. Colon, segmental resection for tumor, rectosigmoid - INVASIVE ADENOCARCINOMA, MODERATELY DIFFERENTIATED, SPANNING 3.9 CM. - TUMOR INVADES THROUGH MUSCULARIS PROPRIA. - RESECTION MARGINS ARE NEGATIVE. - EIGHT OF EIGHT LYMPH NODES NEGATIVE FOR CARCINOMA (0/8), SEE COMMENT. - SEE ONCOLOGY TABLE. 2. Soft tissue, biopsy,  left lateral pelvic floor - BENIGN SKELETAL MUSCLE AND SOFT TISSUE. - NO MALIGNANCY IDENTIFIED. 3. Colon, segmental resection, proximal sigmoid - BENIGN COLONIC MUCOSA. - NO DYSPLASIA OR MALIGNANCY. 4. Liver, biopsy, liver mass - BENIGN VASCULAR PROLIFERATION CONSISTENT WITH HEMANGIOMA. - NO MALIGNANCY IDENTIFIED.      HISTORY OF PRESENTING ILLNESS (10/25/16):  David Clarke 61 y.o. male is here because of his recently diagnosed rectal adenocarcinoma. He was referred by his gastroenterologist title parietal. Patient presents to my clinic, accompanied by his wife and his daughter.       He has noticed a anal mass accompanied by intermittent bleeding and pain in the rectum for the past 5 months. The patient initially presented to the [facility] ED on 10/03/16 with rectal bleeding and difficulty with hygeine. He was without health insurance previously and so did not pursue evaluation or treatment until this time.  The patient was seen by Dr. Hilarie Fredrickson for a colonoscopy and biopsy on 10/09/16, revealing a large partially obstructing low rectal mass, prolapsing to anal verge. Biopsy showed adenocarcinoma. CT C/A/P on 10/13/16 revealed a large, irregular, enhancing mass consistent with known rectal cancer. There were several suspicious small perirectal and small pelvic sidewall lymph nodes. Prominent perirectal vessels were also noted. MRI of the pelvis on 10/24/16 showed a large mass centered at the anus with extension into the low rectum without obstruction. There was suspicious mild mesorectal adenopathy, and a right inguinal hernia containing nonobstructive small bowel.   The patient comes to the clinic for consultation today accompanied by his partner and daughter. He denies abdominal cramps, nausea, bowel or bladder concerns. He reports taking OTC stool softener as needed. He reports his appetite is good, but he has lost 12-15 lbs in the past few weeks. His daughter notes she has hemochromatosis and questions  if this could be playing a role in the patient's anemia.  CURRENT THERAPY:  Adjuvant APOX to start in 3 weeks    INTERIM HISTORY:  David Clarke presents for follow up. He presents to the clinic today with daughter and wife. He reports to still recovering from surgery with Dr. Johney Maine. He went to see him last week and will see him again next week. He says the incision has healed well.  He had good energy but now he has fatigue. His surgical pain was minimal and he was on gabapentin and tramadol which he stopped. He is taking tylenol 1000 mg twice a day.  He feels like he has mild version of vertigo/lightheadedness. It annoying but tolerable. He gained and lost weight due to surgery. His wife says his appetite is OK. He says he snacks a lot. He is trying to get in the habit of walking everyday. He is also working on a project and takes naps when needed. He mostly lays down to alleviate pain in his buttocks.   His stool output would have daily movement then wouldn't have much output. It would be a purplish color. He contacted Dr. Johney Maine office about it and they gave no concern. He had not had a large bowel movement but they are soft stools. He has been taking a fiber supplement.  He has tried Kegel exercises but it does not work.  MEDICAL HISTORY:  Past Medical History:  Diagnosis Date  . Allergy   . Cancer ([place]) 10/09/2016   rectal adenocarcinoma  . History of hiatal hernia     SURGICAL HISTORY: Past Surgical History:  Procedure Laterality Date  . COLONOSCOPY W/ BIOPSIES Bilateral 10/09/2016   rectal adenocarcinoma  . MOUTH SURGERY     age 61, to remove extra "eye" teeth  . [place] teeth extraction    . XI ROBOT ABDOMINAL PERINEAL RESECTION N/A 02/21/2017   Procedure: XI ROBOT ABDOMINOPERINEAL RESECTION WITH COLOSTOMY;  Surgeon: Michael Boston, MD;  Location: [facility];  Service: General;  Laterality: N/A;    SOCIAL HISTORY: Social History   Social History  . Marital status: Single     Spouse name: N/A  . Number of children: 1  . Years of education: N/A   Occupational History  . restaurant/caterer    Social History Main Topics  . Smoking status: Former Smoker    Years: 2.00    Types: Cigarettes    Quit date: 2002  . Smokeless tobacco: Never Used  . Alcohol use 6.0 oz/week    4 Glasses of wine, 6 Cans of beer per week     Comment: beer or a glass or wine most days of the week  . Drug use: No  . Sexual activity: Yes    Partners: Female   Other Topics Concern  . Not on file   Social History Narrative  . No narrative on file    FAMILY HISTORY: Family History  Problem Relation Age of Onset  . Other Mother        bile duct tumor  . Stroke Mother   . Cancer Mother 21       bile duct cancer   . Prostate cancer Father 47  . Emphysema Father   . Colon cancer Neg Hx   . Esophageal cancer Neg Hx   . Pancreatic cancer Neg Hx   . Rectal cancer Neg Hx   . Stomach cancer Neg Hx     ALLERGIES:  is allergic to sulfa antibiotics.  MEDICATIONS:  Current Outpatient Prescriptions  Medication Sig Dispense Refill  . gabapentin (NEURONTIN) 300 MG capsule Take 1 capsule (300 mg total) by mouth at bedtime. 5 capsule 1  . magic mouthwash w/lidocaine SOLN Take 5 mLs by mouth 4 (four) times daily as needed for mouth pain. Swish and Swallow  Or   Swish and Spit. (Patient not taking: Reported on 12/18/2016) 60 mL 0  . methocarbamol (ROBAXIN) 500 MG tablet Take 1-2 tablets (500-1,000 mg total) by mouth every 8 (eight) hours as needed for muscle spasms. 20 tablet 1  . Multiple Vitamin (MULTIVITAMIN WITH MINERALS) TABS tablet Take 1 tablet by mouth 3 (three) times a week.    . Oral Wound Care Products [facility]) LIQD Use as directed 5 mLs in the mouth or throat every 4 (four) hours as needed. (Patient not taking: Reported on 02/15/2017) 240 mL 1  . traMADol (ULTRAM) 50 MG tablet Take 1-2 tablets (50-100 mg total) by mouth every 6 (six) hours as needed for moderate pain or severe  pain. 30 tablet 0   Current Facility-Administered Medications  Medication Dose Route Frequency Provider Last Rate Last Dose  . 0.9 %  sodium chloride infusion  500 mL Intravenous Continuous Pyrtle, Lajuan Lines, MD        REVIEW OF SYSTEMS:   Constitutional: Denies fevers, chills or abnormal night sweats (+) purposeful weight gain  (+) fatigued (+)  subtle lightheadedness  Eyes: Denies blurriness of vision, double vision or watery eyes Ears, nose, mouth, throat, and face: Denies mucositis or sore throat  Respiratory: Denies cough, dyspnea or wheezes Cardiovascular: Denies palpitation, chest discomfort or lower extremity swelling Gastrointestinal:  Denies nausea, heartburn or change in bowel habits (+) colostomy bag Skin: (+) skin discoloration related to radiation therapy Lymphatics: Denies new lymphadenopathy or easy bruising MSK: (+) buttock pain from surgery Neurological: Denies numbness, tingling or new weaknesses Behavioral/Psych: Mood is stable, no new changes  All other systems were reviewed with the patient and are negative.   PHYSICAL EXAMINATION: ECOG PERFORMANCE STATUS: 1 - Symptomatic but completely ambulatory  Vitals:   03/14/17 1102  BP: 120/70  Pulse: 74  Resp: 20  Temp: (!) 97.4 F (36.3 C)   Filed Weights   03/14/17 1102  Weight: 138 lb 6.4 oz (62.8 kg)     GENERAL: alert, no distress and comfortable.  HEENT: thrush is improved  SKIN: skin color, texture, turgor are normal, no rashes or significant lesions NECK: supple, thyroid normal size, non-tender, without nodularity LYMPH: no palpable lymphadenopathy in the cervical, axillary or inguinal LUNGS: clear to auscultation and percussion with normal breathing effort HEART: regular rate & rhythm and no murmurs and no lower extremity edema ABDOMEN: abdomen soft, non-tender and normal bowel sounds (+) colostomy bag in left Upper Quadrant with pink liquid and no stool (+) laparoscopic incisions all  healed Musculoskeletal: no cyanosis of digits and no clubbing  PSYCH: alert & oriented x 3 with fluent speech NEURO: no focal motor/sensory deficits.  RECTAL:   (+) reconstruction is healing well no discharge or redness.    LABORATORY DATA:  I have reviewed the data as listed CBC Latest Ref Rng & Units 03/14/2017 02/22/2017 02/19/2017  WBC 4.0 - 10.3 10e3/uL 6.1 9.8 5.4  Hemoglobin 13.0 - 17.1 g/dL 9.2(L) 8.9(L) 10.3(L)  Hematocrit 38.4 - 49.9 % 28.1(L) 28.7(L) 33.1(L)  Platelets 140 - 400 10e3/uL 665(H) 342 369   CMP Latest Ref Rng & Units 03/14/2017 02/22/2017 02/19/2017  Glucose 70 - 140 mg/dl 118 111(H) 98  BUN 7.0 - 26.0 mg/dL 10.3 10 14   Creatinine 0.7 - 1.3 mg/dL 0.8 0.72 0.59(L)  Sodium 136 - 145 mEq/L 138 138 137  Potassium 3.5 - 5.1 mEq/L 3.9 4.2 3.9  Chloride 101 - 111 mmol/L - 105 105  CO2 22 - 29 mEq/L 25 27 24   Calcium 8.4 - 10.4 mg/dL 9.6 8.4(L) 8.7(L)  Total Protein 6.4 - 8.3 g/dL 7.3 - -  Total Bilirubin 0.20 - 1.20 mg/dL 0.29 - -  Alkaline Phos 40 - 150 U/L 148 - -  AST 5 - 34 U/L 10 - -  ALT 0 - 55 U/L 12 - -     CEA ([facility]) 10/27/2016: 7.81 11/27/2016: 5.56 01/10/2017: 5.61 03/14/17: PENDING  PATHOLOGY:   Diagnosis 02/21/17 1. Colon, segmental resection for tumor, rectosigmoid - INVASIVE ADENOCARCINOMA, MODERATELY DIFFERENTIATED, SPANNING 3.9 CM. - TUMOR INVADES THROUGH MUSCULARIS PROPRIA. - RESECTION MARGINS ARE NEGATIVE. - EIGHT OF EIGHT LYMPH NODES NEGATIVE FOR CARCINOMA (0/8), SEE COMMENT. - SEE ONCOLOGY TABLE. 2. Soft tissue, biopsy, left lateral pelvic floor - BENIGN SKELETAL MUSCLE AND SOFT TISSUE. - NO MALIGNANCY IDENTIFIED. 3. Colon, segmental resection, proximal sigmoid - BENIGN COLONIC MUCOSA. - NO DYSPLASIA OR MALIGNANCY. 4. Liver, biopsy, liver mass - BENIGN VASCULAR PROLIFERATION CONSISTENT WITH HEMANGIOMA. - NO MALIGNANCY IDENTIFIED. Microscopic Comment 1. COLON AND RECTUM (INCLUDING TRANS-ANAL RESECTION): Specimen: Rectosigmoid  colon with anus.  Procedure: Abdominoperineal resection. Tumor site: Distal 1/3 rectum to anus. Specimen integrity: Intact. Macroscopic intactness of mesorectum: Incomplete. Macroscopic tumor perforation: Not identified. Invasive tumor: Maximum size: 3.9 cm Histologic type(s): Invasive adenocarcinoma. Histologic grade and differentiation: G2: moderately differentiated/low grade Type of polyp in which invasive carcinoma arose: N/A. Microscopic extension of invasive tumor: Tumor invades through muscularis propria. Lymph-Vascular invasion: Not identified. 1 of 3 FINAL for David Clarke, David Clarke [id number]) Microscopic Comment(continued) Peri-neural invasion: Present. Tumor deposit(s) (discontinuous extramural extension): Not identified. Resection margins: Proximal margin: Negative. Distal margin: Negative. Circumferential (radial) (posterior ascending, posterior descending; lateral and posterior mid-rectum; and entire lower 1/3 rectum): Negative. Mesenteric margin (sigmoid and transverse): Negative. Distance closest margin (if all above margins negative): <0.1 cm radial margin. >1 cm distal margin. Treatment effect (neo-adjuvant therapy): Present Additional polyp(s): N/A. Non-neoplastic findings: None. Lymph nodes: number examined 8; number positive: 0 (treatment effect present). Pathologic Staging: ypT3, ypN0, ypMX Ancillary studies: Can be performed upon request. Comment: The fat was cleared and additional lymph nodes were not identified.  Diagnosis 10/09/2016 Rectum, biopsy, mass - ADENOCARCINOMA.  RADIOGRAPHIC STUDIES: I have personally reviewed the radiological images as listed and agreed with the findings in the report.  MRI Pelvis W WO Contrast 01/29/17 IMPRESSION: Significant decrease in size of soft tissue mass at the anorectal junction, with post radiation changes. No residual perirectal lymphadenopathy or other signs of pelvic metastatic disease. Stable small right inguinal  hernia containing small bowel.  CT A/P W Contrast 12/07/16 IMPRESSION: 1. Abnormal dilatation of the proximal small bowel loops worrisome for either small bowel obstruction versus small bowel ileus. 2. 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 3. Large right inguinal hernia containing edematous loop of distal small bowel 4. No pneumatosis or bowel perforation.  No abscess. 5. Aortic atherosclerosis 6. Mild decrease in size of rectal mass. No change in right common iliac adenopathy.  DG Chest 2 View 12/03/2016 IMPRESSION: 1. No active cardiopulmonary disease. No evidence of pneumonia or pulmonary edema. No evidence of intrathoracic metastasis. 2. Hyperexpanded lungs suggesting COPD.  MRI Pelvis 10/24/2016 IMPRESSION: 1. Moderately motion degraded exam. 2. Large mass is centered at the anus with extension into the low rectum. No complicating obstruction. 3. Mild mesorectal adenopathy, suspicious. 4. Right inguinal hernia containing nonobstructive small bowel.  CT CAP w Contrast 10/13/2016 IMPRESSION: Large irregular enhancing rectal mass consistent with known rectal cancer. There are small perirectal and small pelvic sidewall lymph nodes which are suspicious. Prominent perirectal vessels are also noted. No evidence of metastatic retroperitoneal or sigmoid mesocolon adenopathy or hepatic or pulmonary metastasis.  COLONOSOCPY 10/09/2016 Dr. Hilarie Fredrickson  A fungating, infiltrative and ulcerated partially obstructing large mass was found in the distal rectum. The mass was circumferential. The mass measured seven cm in length (prolapsing and from dentate line to approx 7 cm). In addition, its inner diameter measured nine mm. Oozing was present. This was biopsied with a cold forceps for histology. - Due to the distal rectal mass the bowel preparation was poor and unable to be cleared via the  adult upper endoscope (adult upper endoscope used due to obstructing mass). Scope not advanced proximal to the rectosigmoid colon.  ASSESSMENT & PLAN: 61 y.o.  gentleman, previously healthy, presented with a anal mass and a mild intermittent rectal bleeding for 5-6 months. Fatigue, anorexia, fatigue and weight loss for a month. He is currently on radiation for his rectal cancer, chemotherapy has been held  lately.  1. Rectal Adenocarcinoma, low rectum, cT3N1bM0, ypT3N0, stage IIIB, MSI-pending  -I previously reviewed his CT and MRI scan, colonoscopy and biopsy findings in detail with pt and his family members -Based on his imaging findings, he has clinical stage III disease. We reviewed his image in our GI tumor Board, he also has a small liver lesion which is indeterminate, likely benign, and we will follow up with a repeated CT in about 3 months.  -patient received neoadjuvant chemotherapy and radiation with Xeloda, which was held for the last few weeks due to infectious colitis. -He underwent APR on 02/21/2017, surgical margins were negative, all lymph nodes were negative. We've reviewed his surgical pathology findings he did have significant treatment effect on his surgical sample.  - We discussed the role of adjuvant chemotherapy. Given his stage III B disease, I recommend adjuvant FOLFOX or CAPOX. The patient opted CAPOX, which is every 3 weeks, for total 6 cycles.  ---Chemotherapy consent: Side effects including but does not not limited to, fatigue, nausea, vomiting, diarrhea, hair loss, neuropathy, fluid retention, renal and kidney dysfunction, neutropenic fever, needed for blood transfusion, bleeding, were discussed with patient in great detail. He agrees to proceed  -The goal of therapy is curative -I will decrease his first cycle of oxaliplatin platinum by 15%, if he tolerates well, we'll increase to full dose from cycle 2. -He will do another Chemo class.  -He will start chemo in 3 weeks   - I encouraged him to eat well, exercise, in order to recover well before chemo   2. 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 -Hemoglobin was 8.3 initially with low MCV, likely anemia of iron deficiency from chronic blood loss. This was confirmed by his iron study. 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 -previously repeated iron study still showed low iron, his anemia has gotten worse lately, I previously recommend him to take oral iron 1-2 tab a day for a month.  -He will restart oral iron.  -I suggest he watches his BM while on oral iron.  -his repeat iron level today is 15 with saturation 5%, ferritin is normal, I suggest getting another IV iron infusion before starting chemo therapy. He agrees.   3.  Fatigue -Likely the combination of anemia, chemotherapy and radiation and surgery. -I encouraged him to be well, and exercise  4. Physical therapy -Post surgery he would like help with pelvic floor -I will refer him to PT.   Plan -Refer to PT -Lab, f/u and oxaliplatin in 3 weeks  -Chemo class in 2 weeks  -Iv feraheme once next week  -Refill Xeloda   All questions were answered. The patient knows to call the clinic with any problems, questions or concerns.  I spent 25 minutes counseling the patient face to face. The total time spent in the appointment was 40 minutes and more than 50% was on counseling.  This document serves as a record of services personally performed by Truitt Merle, MD. It was created on her behalf by Joslyn Devon, a trained medical scribe. The creation of this record is based on  the scribe's personal observations and the provider's statements  to them. This document has been checked and approved by the attending provider.    Truitt Merle, MD 03/14/2017

## 2017-03-14 ENCOUNTER — Ambulatory Visit (HOSPITAL_BASED_OUTPATIENT_CLINIC_OR_DEPARTMENT_OTHER): Payer: BLUE CROSS/BLUE SHIELD | Admitting: Hematology

## 2017-03-14 ENCOUNTER — Encounter: Payer: Self-pay | Admitting: Hematology

## 2017-03-14 ENCOUNTER — Other Ambulatory Visit (HOSPITAL_BASED_OUTPATIENT_CLINIC_OR_DEPARTMENT_OTHER): Payer: BLUE CROSS/BLUE SHIELD

## 2017-03-14 VITALS — BP 120/70 | HR 74 | Temp 97.4°F | Resp 20 | Ht 72.0 in | Wt 138.4 lb

## 2017-03-14 DIAGNOSIS — K922 Gastrointestinal hemorrhage, unspecified: Secondary | ICD-10-CM

## 2017-03-14 DIAGNOSIS — C20 Malignant neoplasm of rectum: Secondary | ICD-10-CM

## 2017-03-14 DIAGNOSIS — R5383 Other fatigue: Secondary | ICD-10-CM | POA: Diagnosis not present

## 2017-03-14 DIAGNOSIS — D500 Iron deficiency anemia secondary to blood loss (chronic): Secondary | ICD-10-CM | POA: Diagnosis not present

## 2017-03-14 DIAGNOSIS — Z148 Genetic carrier of other disease: Secondary | ICD-10-CM | POA: Diagnosis not present

## 2017-03-14 LAB — CBC WITH DIFFERENTIAL/PLATELET
BASO%: 0.7 % (ref 0.0–2.0)
BASOS ABS: 0 10*3/uL (ref 0.0–0.1)
EOS ABS: 0 10*3/uL (ref 0.0–0.5)
EOS%: 0.8 % (ref 0.0–7.0)
HEMATOCRIT: 28.1 % — AB (ref 38.4–49.9)
HGB: 9.2 g/dL — ABNORMAL LOW (ref 13.0–17.1)
LYMPH#: 0.6 10*3/uL — AB (ref 0.9–3.3)
LYMPH%: 10.4 % — AB (ref 14.0–49.0)
MCH: 28 pg (ref 27.2–33.4)
MCHC: 32.7 g/dL (ref 32.0–36.0)
MCV: 85.5 fL (ref 79.3–98.0)
MONO#: 0.5 10*3/uL (ref 0.1–0.9)
MONO%: 8 % (ref 0.0–14.0)
NEUT#: 4.9 10*3/uL (ref 1.5–6.5)
NEUT%: 80.1 % — AB (ref 39.0–75.0)
PLATELETS: 665 10*3/uL — AB (ref 140–400)
RBC: 3.29 10*6/uL — AB (ref 4.20–5.82)
RDW: 15.7 % — ABNORMAL HIGH (ref 11.0–14.6)
WBC: 6.1 10*3/uL (ref 4.0–10.3)

## 2017-03-14 LAB — COMPREHENSIVE METABOLIC PANEL
ALT: 12 U/L (ref 0–55)
ANION GAP: 12 meq/L — AB (ref 3–11)
AST: 10 U/L (ref 5–34)
Albumin: 3 g/dL — ABNORMAL LOW (ref 3.5–5.0)
Alkaline Phosphatase: 148 U/L (ref 40–150)
BUN: 10.3 mg/dL (ref 7.0–26.0)
CALCIUM: 9.6 mg/dL (ref 8.4–10.4)
CHLORIDE: 101 meq/L (ref 98–109)
CO2: 25 meq/L (ref 22–29)
CREATININE: 0.8 mg/dL (ref 0.7–1.3)
Glucose: 118 mg/dl (ref 70–140)
POTASSIUM: 3.9 meq/L (ref 3.5–5.1)
Sodium: 138 mEq/L (ref 136–145)
Total Bilirubin: 0.29 mg/dL (ref 0.20–1.20)
Total Protein: 7.3 g/dL (ref 6.4–8.3)

## 2017-03-14 LAB — IRON AND TIBC
%SAT: 5 % — AB (ref 20–55)
IRON: 15 ug/dL — AB (ref 42–163)
TIBC: 295 ug/dL (ref 202–409)
UIBC: 280 ug/dL (ref 117–376)

## 2017-03-14 LAB — FERRITIN: FERRITIN: 51 ng/mL (ref 22–316)

## 2017-03-14 LAB — CEA (IN HOUSE-CHCC): CEA (CHCC-In House): 5.11 ng/mL — ABNORMAL HIGH (ref 0.00–5.00)

## 2017-03-14 NOTE — Progress Notes (Signed)
START ON PATHWAY REGIMEN - Colorectal     A cycle is every 21 days:     Capecitabine      Oxaliplatin   **Always confirm dose/schedule in your pharmacy ordering system**    Patient Characteristics: Rectal Neoadjuvant/Adjuvant, T3 - T4, N0 or Any T, N+**, Postoperative - Had Neoadjuvant Therapy - Preoperative Node Positive Current evidence of distant metastases? No AJCC T Category: T3 AJCC N Category: N1 AJCC M Category: M0 AJCC 8 Stage Grouping: IIIB Intent of Therapy: Curative Intent, Discussed with Patient

## 2017-03-15 ENCOUNTER — Other Ambulatory Visit (HOSPITAL_COMMUNITY)
Admission: RE | Admit: 2017-03-15 | Discharge: 2017-03-15 | Disposition: A | Discharge status: Home / Self Care | Payer: BLUE CROSS/BLUE SHIELD | Source: Ambulatory Visit | Attending: Hematology | Admitting: Hematology

## 2017-03-15 ENCOUNTER — Telehealth: Payer: Self-pay | Admitting: *Deleted

## 2017-03-15 DIAGNOSIS — C187 Malignant neoplasm of sigmoid colon: Secondary | ICD-10-CM | POA: Diagnosis not present

## 2017-03-15 DIAGNOSIS — C801 Malignant (primary) neoplasm, unspecified: Secondary | ICD-10-CM | POA: Diagnosis present

## 2017-03-15 MED ORDER — PROCHLORPERAZINE MALEATE 10 MG PO TABS: 10.0000 mg | ORAL_TABLET | Freq: Four times a day (QID) | ORAL | 1 refills | Status: DC | PRN

## 2017-03-15 MED ORDER — CAPECITABINE 500 MG PO TABS: ORAL_TABLET | ORAL | 4 refills | Status: DC

## 2017-03-15 MED ORDER — ONDANSETRON HCL 8 MG PO TABS: 8.0000 mg | ORAL_TABLET | Freq: Two times a day (BID) | ORAL | 1 refills | Status: DC | PRN

## 2017-03-15 NOTE — Telephone Encounter (Signed)
Faxed xeloda script to [place] @ [phone].

## 2017-03-15 NOTE — Addendum Note (Signed)
Addended by: Truitt Merle on: 03/15/2017 08:32 AM   Modules accepted: Orders

## 2017-03-19 ENCOUNTER — Telehealth: Payer: Self-pay | Admitting: Hematology

## 2017-03-19 NOTE — Telephone Encounter (Signed)
Spoke with patient re next appointment for 7/26. Per patient appointment moved from AM to PM as he has had iv iron before. Patient will get schedule of remaining appointments 7/26.

## 2017-03-22 ENCOUNTER — Ambulatory Visit (HOSPITAL_BASED_OUTPATIENT_CLINIC_OR_DEPARTMENT_OTHER): Payer: BLUE CROSS/BLUE SHIELD

## 2017-03-22 VITALS — BP 102/72 | HR 72 | Temp 98.0°F | Resp 18

## 2017-03-22 DIAGNOSIS — K922 Gastrointestinal hemorrhage, unspecified: Secondary | ICD-10-CM

## 2017-03-22 DIAGNOSIS — D500 Iron deficiency anemia secondary to blood loss (chronic): Secondary | ICD-10-CM

## 2017-03-22 MED ORDER — FERUMOXYTOL INJECTION 510 MG/17 ML
510.0000 mg | Freq: Once | INTRAVENOUS | Status: AC
  Administered 2017-03-22: 510 mg via INTRAVENOUS
  Filled 2017-03-22: qty 17

## 2017-03-22 MED ORDER — SODIUM CHLORIDE 0.9 % IV SOLN
Freq: Once | INTRAVENOUS | Status: AC
  Administered 2017-03-22: 15:00:00 via INTRAVENOUS

## 2017-03-22 NOTE — Patient Instructions (Signed)

## 2017-03-27 ENCOUNTER — Encounter: Payer: Self-pay | Admitting: *Deleted

## 2017-03-28 ENCOUNTER — Encounter: Payer: Self-pay | Admitting: *Deleted

## 2017-03-28 ENCOUNTER — Other Ambulatory Visit: Payer: BLUE CROSS/BLUE SHIELD

## 2017-04-03 ENCOUNTER — Ambulatory Visit: Payer: BLUE CROSS/BLUE SHIELD | Attending: Hematology | Admitting: Physical Therapy

## 2017-04-03 DIAGNOSIS — R252 Cramp and spasm: Secondary | ICD-10-CM | POA: Insufficient documentation

## 2017-04-03 DIAGNOSIS — M6281 Muscle weakness (generalized): Secondary | ICD-10-CM | POA: Diagnosis present

## 2017-04-03 NOTE — Patient Instructions (Addendum)
Slow Contraction: Gravity Eliminated (Side-Lying)    Lie on left side, hips and knees slightly bent. Slowly squeeze pelvic floor by feeling the penis tuck in and lower abdominals tighten for _5__ seconds. Rest for _5__ seconds. Repeat _10 times 3 times per day. When gets easy can do in sitting.   Copyright  VHI. All rights reserved.  [place] [address], [place], [place] [address] Phone # [phone] Fax [phone]

## 2017-04-03 NOTE — Therapy (Signed)
[facility] [address], [place], [place], [address] Phone: [phone]   Fax:  [phone]  Physical Therapy Evaluation  Patient Details  Name: David Clarke MRN: [id number] Date of Birth: Sep 13, 1955 Referring Provider: Dr. Michael Boston  Encounter Date: 04/03/2017      PT End of Session - 04/03/17 1631    Visit Number 1   Date for PT Re-Evaluation 06/26/17   Authorization Type [employer]   Authorization - Visit Number 1   Authorization - Number of Visits 30   PT Start Time 6389   PT Stop Time 3734   PT Time Calculation (min) 44 min   Activity Tolerance Patient tolerated treatment well   Behavior During Therapy [facility] for tasks assessed/performed      Past Medical History:  Diagnosis Date  . Allergy   . Cancer ([place]) 10/09/2016   rectal adenocarcinoma  . History of hiatal hernia     Past Surgical History:  Procedure Laterality Date  . COLONOSCOPY W/ BIOPSIES Bilateral 10/09/2016   rectal adenocarcinoma  . MOUTH SURGERY     age 61, to remove extra "eye" teeth  . [place] teeth extraction    . XI ROBOT ABDOMINAL PERINEAL RESECTION N/A 02/21/2017   Procedure: XI ROBOT ABDOMINOPERINEAL RESECTION WITH COLOSTOMY;  Surgeon: Michael Boston, MD;  Location: [facility];  Service: General;  Laterality: N/A;    There were no vitals filed for this visit.       Subjective Assessment - 04/03/17 1537    Subjective Patient has a cholostomy bag that is pernament.  Patient is having pain with sitting.  No urinary leakage. Unable to get an erection.    Patient Stated Goals reduce pain; normal sexual function; increase strength   Currently in Pain? Yes   Pain Score 5    Pain Location Rectum   Pain Orientation Mid   Pain Descriptors / Indicators Discomfort   Pain Type Acute pain   Pain Onset More than a month ago   Pain Frequency Intermittent   Aggravating Factors  sitting;    Pain Relieving Factors stretches   Multiple Pain Sites No             [facility] PT Assessment - 04/03/17 0001      Assessment   Medical Diagnosis C20 Rectal Adenocarcinoma   Referring Provider Dr. Michael Boston   Onset Date/Surgical Date 10/09/16   Prior Therapy None     Precautions   Precautions Other (comment)   Precaution Comments cancer precautions; lifting no more than 25#     Restrictions   Weight Bearing Restrictions No     Balance Screen   Has the patient fallen in the past 6 months No   Has the patient had a decrease in activity level because of a fear of falling?  No   Is the patient reluctant to leave their home because of a fear of falling?  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 residence     Prior Function   Level of Independence Independent   Vocation Part time employment   Leisure walk 1.5 miles 4-5 days per week     Cognition   Overall Cognitive Status Within Functional Limits for tasks assessed     Observation/Other Assessments   Skin Integrity surgical site on abdomen have decreased mobility   Focus on Therapeutic Outcomes (FOTO)  38% limitation     Posture/Postural Control   Posture/Postural Control No significant limitations  ROM / Strength   AROM / PROM / Strength AROM;PROM;Strength     AROM   Overall AROM Comments lumbar ROM decreased by 50%     PROM   Left Hip Flexion 105   Left Hip ABduction 25     Strength   Right Hip Flexion 4/5   Right Hip External Rotation  4/5   Right Hip ABduction 4/5   Right Hip ADduction 3/5   Left Hip Flexion 3+/5   Left Hip External Rotation 4/5   Left Hip ABduction 3/5   Left Hip ADduction 3+/5   Right/Left Knee Right;Left   Right Knee Flexion 5/5   Right Knee Extension 4/5   Left Knee Flexion 4+/5   Left Knee Extension 4/5     Right Hip   Right Hip Flexion 115   Right Hip ABduction 20     Flexibility   Soft Tissue Assessment /Muscle Length yes   Hamstrings popliteal angle bil. 50 degrees   Quadriceps tight     Palpation   Spinal  mobility decreased mobility of T10-L5   Palpation comment tightness located in lower abdominal area            Objective measurements completed on examination: See above findings.        Pelvic Floor Special Questions - 04/03/17 0001    Urinary Leakage No   Urinary frequency --  urine is slow and slow to start   Skin Integrity Intact   External Palpation tenderness located on coccyx and posterior anal opening   Exam Type Deferred  due to recent surgery; and very little anal opening          [facility] Adult PT Treatment/Exercise - 04/03/17 0001      Transfers   Transfers Not assessed     Ambulation/Gait   Gait Pattern Within Functional Limits                PT Education - 04/03/17 1624    Education provided Yes   Education Details pelvic floor contraction    Person(s) Educated Patient   Methods Explanation;Demonstration;Verbal cues;Handout   Comprehension Returned demonstration;Verbalized understanding          PT Short Term Goals - 04/03/17 1703      PT SHORT TERM GOAL #1   Title independent with flexibility exercises to improve elongation of the hip and thigh muscles   Time 4   Period Weeks   Status New   Target Date 05/01/17     PT SHORT TERM GOAL #2   Title abilty to sit on bilateral buttocks for 30 minutes with pain decreased >/= 25%   Time 4   Period Weeks   Status New   Target Date 05/01/17     PT SHORT TERM GOAL #3   Title ability for the penis to slightly become erect due to increased pelvic floor strength and improved tissue mobility   Time 4   Period Weeks   Status New   Target Date 05/01/17           PT Long Term Goals - 04/03/17 1641      PT LONG TERM GOAL #1   Title independent with HEP and understand how to progress himself   Time 12   Period Weeks   Status New   Target Date 06/26/17     PT LONG TERM GOAL #2   Title pain with sitting on bilateral buttocks for 30 minutes with decreased >/= 75% due to  tissue  healing and improved tissue mobility   Time 12   Period Weeks   Status New   Target Date 06/26/17     PT LONG TERM GOAL #3   Title ability to have an erection due to improved pelvic floor strength and improved pelvic floor tissue mobility   Time 12   Period Weeks   Status New   Target Date 06/26/17     PT LONG TERM GOAL #4   Title ability to lift 25# with correct bodymechanics due to LE strength >/= 4+/5   Time 12   Period Weeks   Status New   Target Date 06/26/17                Plan - 04/03/17 1633    Clinical Impression Statement Patient was diagnosis with rectal adenocarimona on 10/09/2016. Patient has had laproscopic surgery to remove the mass and perform a colostomy bag on 02/21/2017. Patient reports pain in the rectal area at level 5/10 and is worse with sitting.  Patient has to sit on one side to reduce the pain.  Unable to assess pelvic floor strength but able to palpate externally the muscle to contract with lower abdominal contraction with verbal cues.  Patient is not able to feel the contraction at this time due to recent surgery. Patient has decreased mobility of bilateral hips, hamstrings and quadriceps.  Bilateral lower extremity strength is average 3-4/5. Patient has lost weight and has decreased endurance.  Patient will benefit from skilled therapy to improve strength and endurance to improve function and sexual function.    History and Personal Factors relevant to plan of care: s/p recta adenocarinoma on 10/09/2016; colonscopy bag; Chemotherapy with radiation 11/08/2016-12/19/2016; starting another round of chemotherapy next week   Clinical Presentation Evolving   Clinical Presentation due to: evolving due to starting second round of chemotherapy which may make him weak   Clinical Decision Making Low   Rehab Potential Excellent   Clinical Impairments Affecting Rehab Potential s/p recta adenocarinoma on 10/09/2016; colonscopy bag; Chemotherapy with radiation  11/08/2016-12/19/2016; starting another round of chemotherapy next week   PT Frequency 1x / week   PT Duration 12 weeks   PT Treatment/Interventions Biofeedback;Therapeutic activities;Therapeutic exercise;Neuromuscular re-education;Patient/family education;Passive range of motion;Scar mobilization;Manual techniques;Dry needling   PT Next Visit Plan hip stretches; pelvic EMG; rolling of muscles; Dry needling to hamstring; lumbar mobilization   PT Home Exercise Plan hip stretches; quick flicks   Consulted and Agree with Plan of Care Patient      Patient will benefit from skilled therapeutic intervention in order to improve the following deficits and impairments:  Pain, Decreased strength, Decreased mobility, Decreased activity tolerance, Decreased endurance, Decreased range of motion, Increased fascial restricitons, Increased muscle spasms  Visit Diagnosis: Muscle weakness (generalized) - Plan: PT plan of care cert/re-cert  Cramp and spasm - Plan: PT plan of care cert/re-cert     Problem List Patient Active Problem List   Diagnosis Date Noted  . 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 Fever   . Dehydration   . Diarrhea   . 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 Hypokalemia 12/03/2016  . Leukopenia 12/03/2016  . Prolonged Q-T interval on ECG 12/03/2016  . Oral thrush 12/03/2016  . Hemochromatosis 11/19/2016  . Family history of hemochromatosis 10/25/2016  . Iron deficiency anemia due to chronic blood loss 10/25/2016  . 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 10/05/2016    Earlie Counts, PT 04/03/17 5:15 PM  [facility] [address], [place], [place], [address] Phone: [phone]   Fax:  [phone]  Name: David Clarke MRN: [id number] Date of Birth: 12-20-1955

## 2017-04-04 ENCOUNTER — Ambulatory Visit (HOSPITAL_BASED_OUTPATIENT_CLINIC_OR_DEPARTMENT_OTHER): Payer: BLUE CROSS/BLUE SHIELD | Admitting: Hematology

## 2017-04-04 ENCOUNTER — Telehealth: Payer: Self-pay | Admitting: Hematology

## 2017-04-04 ENCOUNTER — Ambulatory Visit (HOSPITAL_BASED_OUTPATIENT_CLINIC_OR_DEPARTMENT_OTHER): Payer: BLUE CROSS/BLUE SHIELD

## 2017-04-04 ENCOUNTER — Encounter: Payer: Self-pay | Admitting: Hematology

## 2017-04-04 ENCOUNTER — Other Ambulatory Visit (HOSPITAL_BASED_OUTPATIENT_CLINIC_OR_DEPARTMENT_OTHER): Payer: BLUE CROSS/BLUE SHIELD

## 2017-04-04 VITALS — BP 121/83 | HR 68 | Temp 97.6°F | Resp 18 | Ht 72.0 in | Wt 139.1 lb

## 2017-04-04 DIAGNOSIS — R5383 Other fatigue: Secondary | ICD-10-CM

## 2017-04-04 DIAGNOSIS — Z8349 Family history of other endocrine, nutritional and metabolic diseases: Secondary | ICD-10-CM

## 2017-04-04 DIAGNOSIS — C20 Malignant neoplasm of rectum: Secondary | ICD-10-CM

## 2017-04-04 DIAGNOSIS — Z5111 Encounter for antineoplastic chemotherapy: Secondary | ICD-10-CM | POA: Diagnosis not present

## 2017-04-04 DIAGNOSIS — Z148 Genetic carrier of other disease: Secondary | ICD-10-CM | POA: Diagnosis not present

## 2017-04-04 DIAGNOSIS — D500 Iron deficiency anemia secondary to blood loss (chronic): Secondary | ICD-10-CM | POA: Diagnosis not present

## 2017-04-04 LAB — CBC WITH DIFFERENTIAL/PLATELET
BASO%: 0.6 % (ref 0.0–2.0)
BASOS ABS: 0 10*3/uL (ref 0.0–0.1)
EOS%: 0.6 % (ref 0.0–7.0)
Eosinophils Absolute: 0 10*3/uL (ref 0.0–0.5)
HEMATOCRIT: 31.8 % — AB (ref 38.4–49.9)
HGB: 10.1 g/dL — ABNORMAL LOW (ref 13.0–17.1)
LYMPH#: 0.6 10*3/uL — AB (ref 0.9–3.3)
LYMPH%: 8.3 % — ABNORMAL LOW (ref 14.0–49.0)
MCH: 26.4 pg — AB (ref 27.2–33.4)
MCHC: 31.6 g/dL — AB (ref 32.0–36.0)
MCV: 83.7 fL (ref 79.3–98.0)
MONO#: 0.7 10*3/uL (ref 0.1–0.9)
MONO%: 9 % (ref 0.0–14.0)
NEUT#: 6 10*3/uL (ref 1.5–6.5)
NEUT%: 81.5 % — AB (ref 39.0–75.0)
Platelets: 529 10*3/uL — ABNORMAL HIGH (ref 140–400)
RBC: 3.8 10*6/uL — ABNORMAL LOW (ref 4.20–5.82)
RDW: 17.9 % — ABNORMAL HIGH (ref 11.0–14.6)
WBC: 7.3 10*3/uL (ref 4.0–10.3)

## 2017-04-04 LAB — COMPREHENSIVE METABOLIC PANEL
ALT: 10 U/L (ref 0–55)
AST: 13 U/L (ref 5–34)
Albumin: 2.9 g/dL — ABNORMAL LOW (ref 3.5–5.0)
Alkaline Phosphatase: 123 U/L (ref 40–150)
Anion Gap: 12 mEq/L — ABNORMAL HIGH (ref 3–11)
BUN: 15.6 mg/dL (ref 7.0–26.0)
CALCIUM: 9.5 mg/dL (ref 8.4–10.4)
CHLORIDE: 100 meq/L (ref 98–109)
CO2: 25 mEq/L (ref 22–29)
Creatinine: 0.7 mg/dL (ref 0.7–1.3)
Glucose: 109 mg/dl (ref 70–140)
POTASSIUM: 4 meq/L (ref 3.5–5.1)
Sodium: 136 mEq/L (ref 136–145)
Total Bilirubin: 0.31 mg/dL (ref 0.20–1.20)
Total Protein: 7.7 g/dL (ref 6.4–8.3)

## 2017-04-04 MED ORDER — OXALIPLATIN CHEMO INJECTION 100 MG/20ML
110.0000 mg/m2 | Freq: Once | INTRAVENOUS | Status: AC
  Administered 2017-04-04: 195 mg via INTRAVENOUS
  Filled 2017-04-04: qty 39

## 2017-04-04 MED ORDER — SODIUM CHLORIDE 0.9 % IV SOLN
10.0000 mg | Freq: Once | INTRAVENOUS | Status: AC
  Administered 2017-04-04: 10 mg via INTRAVENOUS
  Filled 2017-04-04: qty 1

## 2017-04-04 MED ORDER — DEXTROSE 5 % IV SOLN
Freq: Once | INTRAVENOUS | Status: AC
  Administered 2017-04-04: 15:00:00 via INTRAVENOUS

## 2017-04-04 MED ORDER — PALONOSETRON HCL INJECTION 0.25 MG/5ML
INTRAVENOUS | Status: AC
  Filled 2017-04-04: qty 5

## 2017-04-04 MED ORDER — DEXAMETHASONE SODIUM PHOSPHATE 10 MG/ML IJ SOLN
10.0000 mg | Freq: Once | INTRAMUSCULAR | Status: DC
  Administered 2017-04-04: 10 mg via INTRAVENOUS

## 2017-04-04 MED ORDER — PALONOSETRON HCL INJECTION 0.25 MG/5ML
0.2500 mg | Freq: Once | INTRAVENOUS | Status: AC
  Administered 2017-04-04: 0.25 mg via INTRAVENOUS

## 2017-04-04 NOTE — Patient Instructions (Signed)
[place] Discharge Instructions for Patients Receiving Chemotherapy  Today you received the following chemotherapy agent oxaliplatin. To help prevent nausea and vomiting after your treatment, we encourage you to take your nausea medication compazine.   You may use zofran starting on Saturday.   If you develop nausea and vomiting that is not controlled by your nausea medication, call the clinic.   BELOW ARE SYMPTOMS THAT SHOULD BE REPORTED IMMEDIATELY:  *FEVER GREATER THAN 100.5 F  *CHILLS WITH OR WITHOUT FEVER  NAUSEA AND VOMITING THAT IS NOT CONTROLLED WITH YOUR NAUSEA MEDICATION  *UNUSUAL SHORTNESS OF BREATH  *UNUSUAL BRUISING OR BLEEDING  TENDERNESS IN MOUTH AND THROAT WITH OR WITHOUT PRESENCE OF ULCERS  *URINARY PROBLEMS  *BOWEL PROBLEMS  UNUSUAL RASH Items with * indicate a potential emergency and should be followed up as soon as possible.  Feel free to call the clinic you have any questions or concerns. The clinic phone number is [phone].  Please show the [place] at check-in to the Emergency Department and triage nurse.  Oxaliplatin Injection What is this medicine? OXALIPLATIN (ox AL i PLA tin) is a chemotherapy drug. It targets fast dividing cells, like cancer cells, and causes these cells to die. This medicine is used to treat cancers of the colon and rectum, and many other cancers. This medicine may be used for other purposes; ask your health care provider or pharmacist if you have questions. COMMON BRAND NAME(S): Eloxatin What should I tell my health care provider before I take this medicine? They need to know if you have any of these conditions: -kidney disease -an unusual or allergic reaction to oxaliplatin, other chemotherapy, other medicines, foods, dyes, or preservatives -pregnant or trying to get pregnant -breast-feeding How should I use this medicine? This drug is given as an infusion into a vein. It is administered in a  hospital or clinic by a specially trained health care professional. Talk to your pediatrician regarding the use of this medicine in children. Special care may be needed. Overdosage: If you think you have taken too much of this medicine contact a poison control center or emergency room at once. NOTE: This medicine is only for you. Do not share this medicine with others. What if I miss a dose? It is important not to miss a dose. Call your doctor or health care professional if you are unable to keep an appointment. What may interact with this medicine? -medicines to increase blood counts like filgrastim, pegfilgrastim, sargramostim -probenecid -some antibiotics like amikacin, gentamicin, neomycin, polymyxin B, streptomycin, tobramycin -zalcitabine Talk to your doctor or health care professional before taking any of these medicines: -acetaminophen -aspirin -ibuprofen -ketoprofen -naproxen This list may not describe all possible interactions. Give your health care provider a list of all the medicines, herbs, non-prescription drugs, or dietary supplements you use. Also tell them if you smoke, drink alcohol, or use illegal drugs. Some items may interact with your medicine. What should I watch for while using this medicine? Your condition will be monitored carefully while you are receiving this medicine. You will need important blood work done while you are taking this medicine. This medicine can make you more sensitive to cold. Do not drink cold drinks or use ice. Cover exposed skin before coming in contact with cold temperatures or cold objects. When out in cold weather wear warm clothing and cover your mouth and nose to warm the air that goes into your lungs. Tell your doctor if you get sensitive  to the cold. This drug may make you feel generally unwell. This is not uncommon, as chemotherapy can affect healthy cells as well as cancer cells. Report any side effects. Continue your course of treatment  even though you feel ill unless your doctor tells you to stop. In some cases, you may be given additional medicines to help with side effects. Follow all directions for their use. Call your doctor or health care professional for advice if you get a fever, chills or sore throat, or other symptoms of a cold or flu. Do not treat yourself. This drug decreases your body's ability to fight infections. Try to avoid being around people who are sick. This medicine may increase your risk to bruise or bleed. Call your doctor or health care professional if you notice any unusual bleeding. Be careful brushing and flossing your teeth or using a toothpick because you may get an infection or bleed more easily. If you have any dental work done, tell your dentist you are receiving this medicine. Avoid taking products that contain aspirin, acetaminophen, ibuprofen, naproxen, or ketoprofen unless instructed by your doctor. These medicines may hide a fever. Do not become pregnant while taking this medicine. Women should inform their doctor if they wish to become pregnant or think they might be pregnant. There is a potential for serious side effects to an unborn child. Talk to your health care professional or pharmacist for more information. Do not breast-feed an infant while taking this medicine. Call your doctor or health care professional if you get diarrhea. Do not treat yourself. What side effects may I notice from receiving this medicine? Side effects that you should report to your doctor or health care professional as soon as possible: -allergic reactions like skin rash, itching or hives, swelling of the face, lips, or tongue -low blood counts - This drug may decrease the number of white blood cells, red blood cells and platelets. You may be at increased risk for infections and bleeding. -signs of infection - fever or chills, cough, sore throat, pain or difficulty passing urine -signs of decreased platelets or  bleeding - bruising, pinpoint red spots on the skin, black, tarry stools, nosebleeds -signs of decreased red blood cells - unusually weak or tired, fainting spells, lightheadedness -breathing problems -chest pain, pressure -cough -diarrhea -jaw tightness -mouth sores -nausea and vomiting -pain, swelling, redness or irritation at the injection site -pain, tingling, numbness in the hands or feet -problems with balance, talking, walking -redness, blistering, peeling or loosening of the skin, including inside the mouth -trouble passing urine or change in the amount of urine Side effects that usually do not require medical attention (report to your doctor or health care professional if they continue or are bothersome): -changes in vision -constipation -hair loss -loss of appetite -metallic taste in the mouth or changes in taste -stomach pain This list may not describe all possible side effects. Call your doctor for medical advice about side effects. You may report side effects to FDA at 1-800-FDA-1088. Where should I keep my medicine? This drug is given in a hospital or clinic and will not be stored at home. NOTE: This sheet is a summary. It may not cover all possible information. If you have questions about this medicine, talk to your doctor, pharmacist, or health care provider.  2018 [employer]vier/Gold Standard (2008-03-10 17:22:47)

## 2017-04-04 NOTE — Progress Notes (Signed)
[facility]  [phone] [phone]  Clinic Follow Up Note   Patient Care Team: Robyn Haber, MD as PCP - General (Family Medicine) Michael Boston, MD as Consulting Physician (General Surgery) Pyrtle, Lajuan Lines, MD as Consulting Physician (Gastroenterology) Truitt Merle, MD as Consulting Physician (Hematology and Oncology) Kyung Rudd, MD as Consulting Physician (Radiation Oncology) 04/04/2017  CHIEF COMPLAINTS:  Follow up rectal adenocarcinoma  Oncology History   Cancer Staging Rectal adenocarcinoma  Staging form: Colon and Rectum, AJCC 8th Edition - Clinical stage from 10/09/2016: Stage IIIB (cT3, cN1b, cM0) - Signed by Truitt Merle, MD on 10/25/2016       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      10/09/2016 Initial Diagnosis    Rectal adenocarcinoma       10/09/2016 Procedure    Colonoscopy showed a fungating, infiltrative and ulcerated partially obstructing large mass in the distal rectum, measuring 9 cm, prolapsing at anal canal. Scope not advanced proximal to the rectosigmoid colon.      10/09/2016 Initial Biopsy    Rectal mass biopsy showed invasive adenocarcinoma.      10/13/2016 Imaging    CT Chest Abdomen Pelvis w contrast IMPRESSION: Large irregular enhancing rectal mass consistent with known rectal cancer. There are small perirectal and small pelvic sidewall lymph nodes which are suspicious. Prominent perirectal vessels are also noted. No evidence of metastatic retroperitoneal or sigmoid mesocolon adenopathy or hepatic or pulmonary metastasis.      10/24/2016 Imaging    MRI Pelvis IMPRESSION: 1. Moderately motion degraded exam. 2. Large mass is centered at the anus with extension into the low rectum. No complicating obstruction. 3. Mild mesorectal adenopathy,  suspicious. 4. Right inguinal hernia containing nonobstructive small bowel.       11/08/2016 - 12/19/2016 Chemotherapy    Xeloda 50 mg twice daily, with concurrent radiation, held since 12/03/2016 due to colitis and hospitalization      11/08/2016 - 12/19/2016 Radiation Therapy    Neoadjuvant radiation to his rectal cancer  1) Pelvis/ 45 Gy in 25 fractions  2) Rectum boost/ 9 Gy in 5 fractions Under the care of Dr. Lisbeth Renshaw.      12/03/2016 - 12/09/2016 Hospital Admission    He presented with few days to one week history of diarrhea with 6-7 loose stools daily, along with a fever of 101.5 on the day of admission. Patient was also noted to be hypokalemic, hyponatremic. Hospitalized for further management. Stool studies were sent. C. difficile was negative. GI pathogen panel was positive for Yersinia.  XELODA was held at this time, until infection has cleared.       12/07/2016 Imaging    CT A/P W Contrast  IMPRESSION: 1. Abnormal dilatation of the proximal small bowel loops worrisome for either small bowel obstruction versus small bowel ileus. 2. 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 3. Large right inguinal hernia containing edematous loop of distal small bowel 4. No pneumatosis or bowel perforation.  No abscess. 5. Aortic atherosclerosis 6. Mild decrease in size of rectal mass. No change in right common iliac adenopathy.      02/21/2017 Surgery    XI ROBOT ABDOMINOPERINEAL RESECTION WITH COLOSTOMY by Dr. Johney Maine      02/21/2017 Pathology Results  Diagnosis 02/21/17 1. Colon, segmental resection for tumor, rectosigmoid - INVASIVE ADENOCARCINOMA, MODERATELY DIFFERENTIATED, SPANNING 3.9 CM. - TUMOR INVADES THROUGH MUSCULARIS PROPRIA. - RESECTION MARGINS ARE NEGATIVE. - EIGHT OF EIGHT LYMPH NODES NEGATIVE FOR CARCINOMA (0/8), SEE COMMENT. - SEE ONCOLOGY TABLE. 2. Soft tissue, biopsy,  left lateral pelvic floor - BENIGN SKELETAL MUSCLE AND SOFT TISSUE. - NO MALIGNANCY IDENTIFIED. 3. Colon, segmental resection, proximal sigmoid - BENIGN COLONIC MUCOSA. - NO DYSPLASIA OR MALIGNANCY. 4. Liver, biopsy, liver mass - BENIGN VASCULAR PROLIFERATION CONSISTENT WITH HEMANGIOMA. - NO MALIGNANCY IDENTIFIED.      04/04/2017 -  Chemotherapy    CAPOX every 3 weeks for 6 cycles      HISTORY OF PRESENTING ILLNESS (10/25/16):  David Clarke 61 y.o. male is here because of his recently diagnosed rectal adenocarcinoma. He was referred by his gastroenterologist title parietal. Patient presents to my clinic, accompanied by his wife and his daughter.       He has noticed a anal mass accompanied by intermittent bleeding and pain in the rectum for the past 5 months. The patient initially presented to the [facility] ED on 10/03/16 with rectal bleeding and difficulty with hygeine. He was without health insurance previously and so did not pursue evaluation or treatment until this time.  The patient was seen by Dr. Hilarie Fredrickson for a colonoscopy and biopsy on 10/09/16, revealing a large partially obstructing low rectal mass, prolapsing to anal verge. Biopsy showed adenocarcinoma. CT C/A/P on 10/13/16 revealed a large, irregular, enhancing mass consistent with known rectal cancer. There were several suspicious small perirectal and small pelvic sidewall lymph nodes. Prominent perirectal vessels were also noted. MRI of the pelvis on 10/24/16 showed a large mass centered at the anus with extension into the low rectum without obstruction. There was suspicious mild mesorectal adenopathy, and a right inguinal hernia containing nonobstructive small bowel.   The patient comes to the clinic for consultation today accompanied by his partner and daughter. He denies abdominal cramps, nausea, bowel or bladder concerns. He reports taking OTC stool softener as needed. He reports his appetite is good, but he has lost 12-15 lbs in the  past few weeks. His daughter notes she has hemochromatosis and questions if this could be playing a role in the patient's anemia.  CURRENT THERAPY:  Adjuvant CAPOX every 3 weeks for 6 cycles, starting 04/04/17   INTERIM HISTORY:  David Clarke presents for follow up and first cycle CAPOX. He presents to the clinic today with his wife. He reports to having pain post surgery still when he sits for so long. He is doing well with his pelvic floor PT. He reports having numbness for his groin and penal function. He is doing some Kegel exercises. He will continue to go there once a week. He has gained 1-2 pounds.  His BM is consistent of every 2-3 days pasty form. He is to take more fiber. He is taking silium and propylene glycol. He feels strong enough to start chemo.  He takes 3 in the morning and 4 at night for Xeloda.  He spoke about his feeling regarding treatment for cancer, but accepts it.  He has zofran and compazine at home.  Dr. Johney Maine gave him a refill of tramadol for post surgery pain, but it is gradually getting better. He has been trying to ween off it. The pain is improving an now a 3-4/10.  So far he has not had diarrhea since his colostomy bag.  He reports he stopped taking  oral iron.    MEDICAL HISTORY:  Past Medical History:  Diagnosis Date  . Allergy   . Cancer ([place]) 10/09/2016   rectal adenocarcinoma  . History of hiatal hernia     SURGICAL HISTORY: Past Surgical History:  Procedure Laterality Date  . COLONOSCOPY W/ BIOPSIES Bilateral 10/09/2016   rectal adenocarcinoma  . MOUTH SURGERY     age 56, to remove extra "eye" teeth  . [place] teeth extraction    . XI ROBOT ABDOMINAL PERINEAL RESECTION N/A 02/21/2017   Procedure: XI ROBOT ABDOMINOPERINEAL RESECTION WITH COLOSTOMY;  Surgeon: Michael Boston, MD;  Location: [facility];  Service: General;  Laterality: N/A;    SOCIAL HISTORY: Social History   Social History  . Marital status: Single    Spouse name: N/A  . Number of  children: 1  . Years of education: N/A   Occupational History  . restaurant/caterer    Social History Main Topics  . Smoking status: Former Smoker    Years: 2.00    Types: Cigarettes    Quit date: 2002  . Smokeless tobacco: Never Used  . Alcohol use 6.0 oz/week    4 Glasses of wine, 6 Cans of beer per week     Comment: beer or a glass or wine most days of the week  . Drug use: No  . Sexual activity: Yes    Partners: Female   Other Topics Concern  . Not on file   Social History Narrative  . No narrative on file    FAMILY HISTORY: Family History  Problem Relation Age of Onset  . Other Mother        bile duct tumor  . Stroke Mother   . Cancer Mother 70       bile duct cancer   . Prostate cancer Father 19  . Emphysema Father   . Colon cancer Neg Hx   . Esophageal cancer Neg Hx   . Pancreatic cancer Neg Hx   . Rectal cancer Neg Hx   . Stomach cancer Neg Hx     ALLERGIES:  is allergic to sulfa antibiotics.  MEDICATIONS:  Current Outpatient Prescriptions  Medication Sig Dispense Refill  . Multiple Vitamin (MULTIVITAMIN WITH MINERALS) TABS tablet Take 1 tablet by mouth 3 (three) times a week.    . traMADol (ULTRAM) 50 MG tablet Take 1-2 tablets (50-100 mg total) by mouth every 6 (six) hours as needed for moderate pain or severe pain. 30 tablet 0  . capecitabine (XELODA) 500 MG tablet Take 4 tab in the morning and 3 tab in the evening, on days 1-14 of chemotherapy every 21 days. (Patient not taking: Reported on 04/04/2017) 98 tablet 4  . gabapentin (NEURONTIN) 300 MG capsule Take 1 capsule (300 mg total) by mouth at bedtime. (Patient not taking: Reported on 04/04/2017) 5 capsule 1  . magic mouthwash w/lidocaine SOLN Take 5 mLs by mouth 4 (four) times daily as needed for mouth pain. Swish and Swallow  Or   Swish and Spit. (Patient not taking: Reported on 12/18/2016) 60 mL 0  . ondansetron (ZOFRAN) 8 MG tablet Take 1 tablet (8 mg total) by mouth 2 (two) times daily as needed for  refractory nausea / vomiting. Start on day 3 after chemotherapy. (Patient not taking: Reported on 04/04/2017) 30 tablet 1  . Oral Wound Care Products [facility]) LIQD Use as directed 5 mLs in the mouth or throat every 4 (four) hours as needed. (Patient not taking: Reported on 02/15/2017) 240 mL  1  . prochlorperazine (COMPAZINE) 10 MG tablet Take 1 tablet (10 mg total) by mouth every 6 (six) hours as needed (Nausea or vomiting). (Patient not taking: Reported on 04/04/2017) 30 tablet 1   Current Facility-Administered Medications  Medication Dose Route Frequency Provider Last Rate Last Dose  . 0.9 %  sodium chloride infusion  500 mL Intravenous Continuous Pyrtle, Lajuan Lines, MD        REVIEW OF SYSTEMS:   Constitutional: Denies fevers, chills or abnormal night sweats (+) purposeful weight gain  (+) fatigued (+) subtle lightheadedness  Eyes: Denies blurriness of vision, double vision or watery eyes Ears, nose, mouth, throat, and face: Denies mucositis or sore throat  Respiratory: Denies cough, dyspnea or wheezes Cardiovascular: Denies palpitation, chest discomfort or lower extremity swelling Gastrointestinal:  Denies nausea, heartburn or change in bowel habits (+) colostomy bag, BM every 2-3 days Skin: (+) skin discoloration related to radiation therapy Lymphatics: Denies new lymphadenopathy or easy bruising MSK: (+) low throbbing buttock pain from surgery 3-4/10, improving  Neurological: Denies numbness, tingling or new weaknesses Behavioral/Psych: Mood is stable, no new changes  All other systems were reviewed with the patient and are negative.   PHYSICAL EXAMINATION: ECOG PERFORMANCE STATUS: 1 - Symptomatic but completely ambulatory  Vitals:   04/04/17 1304  BP: 121/83  Pulse: 68  Resp: 18  Temp: 97.6 F (36.4 C)   Filed Weights   04/04/17 1304  Weight: 139 lb 1.6 oz (63.1 kg)     GENERAL: alert, no distress and comfortable.  HEENT: thrush is improved  SKIN: skin color, texture, turgor are  normal, no rashes or significant lesions NECK: supple, thyroid normal size, non-tender, without nodularity LYMPH: no palpable lymphadenopathy in the cervical, axillary or inguinal LUNGS: clear to auscultation and percussion with normal breathing effort HEART: regular rate & rhythm and no murmurs and no lower extremity edema ABDOMEN: abdomen soft, non-tender and normal bowel sounds (+) colostomy bag in left Upper Quadrant with pink liquid and no stool (+) laparoscopic incisions all healed Musculoskeletal: no cyanosis of digits and no clubbing  PSYCH: alert & oriented x 3 with fluent speech NEURO: no focal motor/sensory deficits.  RECTAL:   (+) reconstruction has healed without discharge or skin redness.    LABORATORY DATA:  I have reviewed the data as listed CBC Latest Ref Rng & Units 04/04/2017 03/14/2017 02/22/2017  WBC 4.0 - 10.3 10e3/uL 7.3 6.1 9.8  Hemoglobin 13.0 - 17.1 g/dL 10.1(L) 9.2(L) 8.9(L)  Hematocrit 38.4 - 49.9 % 31.8(L) 28.1(L) 28.7(L)  Platelets 140 - 400 10e3/uL 529(H) 665(H) 342   CMP Latest Ref Rng & Units 04/04/2017 03/14/2017 02/22/2017  Glucose 70 - 140 mg/dl 109 118 111(H)  BUN 7.0 - 26.0 mg/dL 15.6 10.3 10  Creatinine 0.7 - 1.3 mg/dL 0.7 0.8 0.72  Sodium 136 - 145 mEq/L 136 138 138  Potassium 3.5 - 5.1 mEq/L 4.0 3.9 4.2  Chloride 101 - 111 mmol/L - - 105  CO2 22 - 29 mEq/L 25 25 27   Calcium 8.4 - 10.4 mg/dL 9.5 9.6 8.4(L)  Total Protein 6.4 - 8.3 g/dL 7.7 7.3 -  Total Bilirubin 0.20 - 1.20 mg/dL 0.31 0.29 -  Alkaline Phos 40 - 150 U/L 123 148 -  AST 5 - 34 U/L 13 10 -  ALT 0 - 55 U/L 10 12 -     CEA ([facility]) 10/27/2016: 7.81 11/27/2016: 5.56 01/10/2017: 5.61 03/14/17: 5.11   PATHOLOGY:   Diagnosis 02/21/17 1. Colon, segmental resection  for tumor, rectosigmoid - INVASIVE ADENOCARCINOMA, MODERATELY DIFFERENTIATED, SPANNING 3.9 CM. - TUMOR INVADES THROUGH MUSCULARIS PROPRIA. - RESECTION MARGINS ARE NEGATIVE. - EIGHT OF EIGHT LYMPH NODES NEGATIVE FOR  CARCINOMA (0/8), SEE COMMENT. - SEE ONCOLOGY TABLE. 2. Soft tissue, biopsy, left lateral pelvic floor - BENIGN SKELETAL MUSCLE AND SOFT TISSUE. - NO MALIGNANCY IDENTIFIED. 3. Colon, segmental resection, proximal sigmoid - BENIGN COLONIC MUCOSA. - NO DYSPLASIA OR MALIGNANCY. 4. Liver, biopsy, liver mass - BENIGN VASCULAR PROLIFERATION CONSISTENT WITH HEMANGIOMA. - NO MALIGNANCY IDENTIFIED. Microscopic Comment 1. COLON AND RECTUM (INCLUDING TRANS-ANAL RESECTION): Specimen: Rectosigmoid colon with anus. Procedure: Abdominoperineal resection. Tumor site: Distal 1/3 rectum to anus. Specimen integrity: Intact. Macroscopic intactness of mesorectum: Incomplete. Macroscopic tumor perforation: Not identified. Invasive tumor: Maximum size: 3.9 cm Histologic type(s): Invasive adenocarcinoma. Histologic grade and differentiation: G2: moderately differentiated/low grade Type of polyp in which invasive carcinoma arose: N/A. Microscopic extension of invasive tumor: Tumor invades through muscularis propria. Lymph-Vascular invasion: Not identified. 1 of 3 FINAL for SAMUELE, STOREY [phone]) Microscopic Comment(continued) Peri-neural invasion: Present. Tumor deposit(s) (discontinuous extramural extension): Not identified. Resection margins: Proximal margin: Negative. Distal margin: Negative. Circumferential (radial) (posterior ascending, posterior descending; lateral and posterior mid-rectum; and entire lower 1/3 rectum): Negative. Mesenteric margin (sigmoid and transverse): Negative. Distance closest margin (if all above margins negative): <0.1 cm radial margin. >1 cm distal margin. Treatment effect (neo-adjuvant therapy): Present Additional polyp(s): N/A. Non-neoplastic findings: None. Lymph nodes: number examined 8; number positive: 0 (treatment effect present). Pathologic Staging: ypT3, ypN0, ypMX Ancillary studies: Can be performed upon request. Comment: The fat was cleared and  additional lymph nodes were not identified.  Diagnosis 10/09/2016 Rectum, biopsy, mass - ADENOCARCINOMA.  RADIOGRAPHIC STUDIES: I have personally reviewed the radiological images as listed and agreed with the findings in the report.  MRI Pelvis W WO Contrast 01/29/17 IMPRESSION: Significant decrease in size of soft tissue mass at the anorectal junction, with post radiation changes. No residual perirectal lymphadenopathy or other signs of pelvic metastatic disease. Stable small right inguinal hernia containing small bowel.  CT A/P W Contrast 12/07/16 IMPRESSION: 1. Abnormal dilatation of the proximal small bowel loops worrisome for either small bowel obstruction versus small bowel ileus. 2. 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 3. Large right inguinal hernia containing edematous loop of distal small bowel 4. No pneumatosis or bowel perforation.  No abscess. 5. Aortic atherosclerosis 6. Mild decrease in size of rectal mass. No change in right common iliac adenopathy.  DG Chest 2 View 12/03/2016 IMPRESSION: 1. No active cardiopulmonary disease. No evidence of pneumonia or pulmonary edema. No evidence of intrathoracic metastasis. 2. Hyperexpanded lungs suggesting COPD.  MRI Pelvis 10/24/2016 IMPRESSION: 1. Moderately motion degraded exam. 2. Large mass is centered at the anus with extension into the low rectum. No complicating obstruction. 3. Mild mesorectal adenopathy, suspicious. 4. Right inguinal hernia containing nonobstructive small bowel.  CT CAP w Contrast 10/13/2016 IMPRESSION: Large irregular enhancing rectal mass consistent with known rectal cancer. There are small perirectal and small pelvic sidewall lymph nodes which are suspicious. Prominent perirectal vessels are also noted. No evidence of metastatic retroperitoneal or sigmoid mesocolon adenopathy or hepatic or  pulmonary metastasis.  COLONOSOCPY 10/09/2016 Dr. Hilarie Fredrickson  A fungating, infiltrative and ulcerated partially obstructing large mass was found in the distal rectum. The mass was circumferential. The mass measured seven cm in length (prolapsing and from dentate line to approx 7 cm). In  addition, its inner diameter measured nine mm. Oozing was present. This was biopsied with a cold forceps for histology. - Due to the distal rectal mass the bowel preparation was poor and unable to be cleared via the adult upper endoscope (adult upper endoscope used due to obstructing mass). Scope not advanced proximal to the rectosigmoid colon.  ASSESSMENT & PLAN: 61 y.o.  gentleman, previously healthy, presented with a anal mass and a mild intermittent rectal bleeding for 5-6 months. Fatigue, anorexia, fatigue and weight loss for a month. He is currently on radiation for his rectal cancer, chemotherapy has been held lately.  1. Rectal Adenocarcinoma, low rectum, cT3N1bM0, ypT3N0, stage IIIB, MSI-pending  -I previously reviewed his CT and MRI scan, colonoscopy and biopsy findings in detail with pt and his family members -Based on his imaging findings, he has clinical stage III disease. We reviewed his image in our GI tumor Board, he also has a small liver lesion which is indeterminate, likely benign, and we will follow up with a repeated CT in about 3 months.  -patient received neoadjuvant chemotherapy and radiation with Xeloda, which was held for the last few weeks due to infectious colitis. -He underwent APR on 02/21/2017, surgical margins were negative, all lymph nodes were negative. We've reviewed his surgical pathology findings he did have significant treatment effect on his surgical sample.  - We discussed the role of adjuvant chemotherapy. Given his stage III B disease, I recommend adjuvant FOLFOX or CAPOX. The patient opted CAPOX, which is every 3 weeks, for total 6 cycles.  -He has recovered well, will start  adjuvant CAPOX today. I discussed again the benefit of this chemo therapy and the side effects such as the cold sensitivity and aches.  -I discussed the compazine is good for use for the next three days since we give anti-nausea medication during infusion.  -For mild pain I suggest tylenol, not more than twice a day.  -Labs reviewed and his portion is slightly low but overall he is adequate to start chemo today.  -I will decrease his first cycle of oxaliplatin platinum by 15%, if he tolerates well, we'll increase to full dose from cycle 2. -He knows to call us if he gets any unsuspecting symptoms   2. 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 -Hemoglobin was 8.3 initially with low MCV, likely anemia of iron deficiency from chronic blood loss. This was confirmed by his iron study. 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 -previously repeated iron study still showed low iron, his anemia has gotten worse lately, I previously recommend him to take oral iron 1-2 tab a day for a month.  -He will restart oral iron.  -I suggest he watches his BM while on oral iron.  -his repeat iron level on 03/14/17 is 15 with saturation 5%, ferritin is normal, I gave one dose IV iron infusion, anemia has improved - his Hg is 10.1 today (04/04/17)   3.  Fatigue -Likely the combination of anemia, chemotherapy and radiation and surgery. -I encouraged him to be well, and exercise  4. Physical therapy -Post surgery PT would like help with pelvic floor -continue PT  Plan -will proceed with first dose oxaliplaitn today with slight  dose reduction, he will start Xeloda tonight, continue for 14 days then off 7 days  - Lab, f/u in Lab, f/u and chemo oxaliplatin in 3 and 6 weeks  -Lab, flush and f/u with APP next Thursday or Friday for toxicity checkup    All questions were answered. The patient knows to call the clinic with any problems, questions or concerns.  I spent 25 minutes counseling the patient face to face. The total time spent in the appointment was 30 minutes and more than 50% was on counseling.  This document serves as a record of services personally performed by Truitt Merle, MD. It was created on her behalf by Joslyn Devon, a trained medical scribe. The creation of this record is based on the scribe's personal observations and the provider's statements to them. This document has been checked and approved by the attending provider.    Truitt Merle, MD 04/04/2017

## 2017-04-04 NOTE — Telephone Encounter (Signed)
Gave patient avs report and appointments for August and September  °

## 2017-04-06 ENCOUNTER — Telehealth: Payer: Self-pay | Admitting: *Deleted

## 2017-04-06 NOTE — Telephone Encounter (Signed)
-----   Message from Thomasene Lot, RN sent at 04/04/2017  6:48 PM EDT ----- Regarding: Dr Burr Medico 1st tx f/u call 1st tx f/u call/ pt does not have a port

## 2017-04-06 NOTE — Telephone Encounter (Signed)
Called pt to f/u on oxaliplatin treatment.  Pt reports being tired & decreased appetite but is eating & drinking.  He did challenge the cold sensitivity by drinking something cold & noted a strange feeling so he states he won't drink anything cold.  He denies any other symptoms & knows to call if any questions or concerns.

## 2017-04-11 ENCOUNTER — Ambulatory Visit (HOSPITAL_BASED_OUTPATIENT_CLINIC_OR_DEPARTMENT_OTHER): Payer: BLUE CROSS/BLUE SHIELD | Admitting: Oncology

## 2017-04-11 ENCOUNTER — Encounter: Payer: Self-pay | Admitting: Oncology

## 2017-04-11 ENCOUNTER — Other Ambulatory Visit (HOSPITAL_BASED_OUTPATIENT_CLINIC_OR_DEPARTMENT_OTHER): Payer: BLUE CROSS/BLUE SHIELD

## 2017-04-11 VITALS — BP 122/77 | HR 78 | Temp 98.5°F | Resp 20 | Ht 72.0 in | Wt 136.8 lb

## 2017-04-11 DIAGNOSIS — C20 Malignant neoplasm of rectum: Secondary | ICD-10-CM

## 2017-04-11 DIAGNOSIS — Z933 Colostomy status: Secondary | ICD-10-CM

## 2017-04-11 DIAGNOSIS — D500 Iron deficiency anemia secondary to blood loss (chronic): Secondary | ICD-10-CM

## 2017-04-11 DIAGNOSIS — Z148 Genetic carrier of other disease: Secondary | ICD-10-CM

## 2017-04-11 LAB — COMPREHENSIVE METABOLIC PANEL
ALK PHOS: 115 U/L (ref 40–150)
ALT: 7 U/L (ref 0–55)
ANION GAP: 11 meq/L (ref 3–11)
AST: 13 U/L (ref 5–34)
Albumin: 3.1 g/dL — ABNORMAL LOW (ref 3.5–5.0)
BUN: 15 mg/dL (ref 7.0–26.0)
CALCIUM: 9.6 mg/dL (ref 8.4–10.4)
CHLORIDE: 102 meq/L (ref 98–109)
CO2: 25 mEq/L (ref 22–29)
Creatinine: 0.8 mg/dL (ref 0.7–1.3)
Glucose: 105 mg/dl (ref 70–140)
POTASSIUM: 4.1 meq/L (ref 3.5–5.1)
Sodium: 138 mEq/L (ref 136–145)
Total Bilirubin: 0.29 mg/dL (ref 0.20–1.20)
Total Protein: 7.4 g/dL (ref 6.4–8.3)

## 2017-04-11 LAB — CBC WITH DIFFERENTIAL/PLATELET
BASO%: 0.7 % (ref 0.0–2.0)
BASOS ABS: 0 10*3/uL (ref 0.0–0.1)
EOS ABS: 0 10*3/uL (ref 0.0–0.5)
EOS%: 0.7 % (ref 0.0–7.0)
HEMATOCRIT: 30.7 % — AB (ref 38.4–49.9)
HGB: 10 g/dL — ABNORMAL LOW (ref 13.0–17.1)
LYMPH#: 1 10*3/uL (ref 0.9–3.3)
LYMPH%: 15.1 % (ref 14.0–49.0)
MCH: 27.1 pg — AB (ref 27.2–33.4)
MCHC: 32.7 g/dL (ref 32.0–36.0)
MCV: 82.9 fL (ref 79.3–98.0)
MONO#: 0.6 10*3/uL (ref 0.1–0.9)
MONO%: 8.6 % (ref 0.0–14.0)
NEUT#: 4.9 10*3/uL (ref 1.5–6.5)
NEUT%: 74.9 % (ref 39.0–75.0)
PLATELETS: 577 10*3/uL — AB (ref 140–400)
RBC: 3.7 10*6/uL — ABNORMAL LOW (ref 4.20–5.82)
RDW: 18.1 % — ABNORMAL HIGH (ref 11.0–14.6)
WBC: 6.5 10*3/uL (ref 4.0–10.3)

## 2017-04-11 LAB — IRON AND TIBC
%SAT: 14 % — AB (ref 20–55)
IRON: 36 ug/dL — AB (ref 42–163)
TIBC: 255 ug/dL (ref 202–409)
UIBC: 219 ug/dL (ref 117–376)

## 2017-04-11 LAB — FERRITIN: FERRITIN: 266 ng/mL (ref 22–316)

## 2017-04-11 LAB — CEA (IN HOUSE-CHCC): CEA (CHCC-In House): 5.87 ng/mL — ABNORMAL HIGH (ref 0.00–5.00)

## 2017-04-11 NOTE — Progress Notes (Signed)
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      10/09/2016 Initial Diagnosis    Rectal adenocarcinoma       10/09/2016 Procedure    Colonoscopy showed a fungating, infiltrative and ulcerated partially obstructing large mass in the distal rectum, measuring 9 cm, prolapsing at anal canal. Scope not advanced proximal to the rectosigmoid colon.      10/09/2016 Initial Biopsy    Rectal mass biopsy showed invasive adenocarcinoma.      10/13/2016 Imaging    CT Chest Abdomen Pelvis w contrast IMPRESSION: Large irregular enhancing rectal mass consistent with known rectal cancer. There are small perirectal and small pelvic sidewall lymph nodes which are suspicious. Prominent perirectal vessels are also noted. No evidence of metastatic retroperitoneal or sigmoid mesocolon adenopathy or hepatic or pulmonary metastasis.      10/24/2016 Imaging    MRI Pelvis IMPRESSION: 1. Moderately motion degraded exam. 2. Large mass is centered at the anus with extension into the low rectum. No complicating obstruction. 3. Mild mesorectal adenopathy, suspicious. 4. Right inguinal hernia containing nonobstructive small bowel.       11/08/2016 - 12/19/2016  Chemotherapy    Xeloda 50 mg twice daily, with concurrent radiation, held since 12/03/2016 due to colitis and hospitalization      11/08/2016 - 12/19/2016 Radiation Therapy    Neoadjuvant radiation to his rectal cancer  1) Pelvis/ 45 Gy in 25 fractions  2) Rectum boost/ 9 Gy in 5 fractions Under the care of Dr. Lisbeth Renshaw.      12/03/2016 - 12/09/2016 Hospital Admission    He presented with few days to one week history of diarrhea with 6-7 loose stools daily, along with a fever of 101.5 on the day of admission. Patient was also noted to be hypokalemic, hyponatremic. Hospitalized for further management. Stool studies were sent. C. difficile was negative. GI pathogen panel was positive for Yersinia.  XELODA was held at this time, until infection has cleared.       12/07/2016 Imaging    CT A/P W Contrast  IMPRESSION: 1. Abnormal dilatation of the proximal small bowel loops worrisome for either small bowel obstruction versus small bowel ileus. 2. 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 3. Large right inguinal hernia containing edematous loop of distal small bowel 4. No pneumatosis or bowel perforation.  No abscess. 5. Aortic atherosclerosis 6. Mild decrease in size of rectal mass. No change in right common iliac adenopathy.      02/21/2017 Surgery    XI ROBOT ABDOMINOPERINEAL RESECTION WITH COLOSTOMY by Dr. Johney Maine      02/21/2017 Pathology Results    Diagnosis 02/21/17 1. Colon, segmental resection for tumor,  rectosigmoid - INVASIVE ADENOCARCINOMA, MODERATELY DIFFERENTIATED, SPANNING 3.9 CM. - TUMOR INVADES THROUGH MUSCULARIS PROPRIA. - RESECTION MARGINS ARE NEGATIVE. - EIGHT OF EIGHT LYMPH NODES NEGATIVE FOR CARCINOMA (0/8), SEE COMMENT. - SEE ONCOLOGY TABLE. 2. Soft tissue, biopsy, left lateral pelvic floor - BENIGN SKELETAL MUSCLE AND SOFT TISSUE. - NO MALIGNANCY IDENTIFIED. 3. Colon,  segmental resection, proximal sigmoid - BENIGN COLONIC MUCOSA. - NO DYSPLASIA OR MALIGNANCY. 4. Liver, biopsy, liver mass - BENIGN VASCULAR PROLIFERATION CONSISTENT WITH HEMANGIOMA. - NO MALIGNANCY IDENTIFIED.      04/04/2017 -  Chemotherapy    CAPOX every 3 weeks for 6 cycles       CURRENT THERAPY: adjuvant CAPOX every 3 weeks for 6 cycles starting 04/04/2017  INTERVAL HISTORY: [name] 61 y.o. male returns for routine follow-up. The patient has been tolerating his chemotherapy well. He has ongoing numbness to his groin and penile function. He continues on pelvic floor physical therapy and is doing kilo exercises. Patient reports that his appetite is stable. He denies nausea and vomiting. He has weaned himself off tramadol reports that his pain is a 2-3 out of 10. This is tolerable for him. He denies fevers and chills. Denies chest pain, shortness of breath. Denies abdominal pain Colostomy is functioning well. Denies blood in his stool. He had mild cold sensitivity following his chemotherapy but this has now resolved. This happened on just one occasion. Denies rashes and mucositis.   Patient Active Problem List   Diagnosis Date Noted  . 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 Fever   . Dehydration   . Diarrhea   . 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 Hypokalemia 12/03/2016  . Leukopenia 12/03/2016  . Prolonged Q-T interval on ECG 12/03/2016  . Oral thrush 12/03/2016  . Hemochromatosis 11/19/2016  . Family history of hemochromatosis 10/25/2016  . Iron deficiency anemia due to chronic blood loss 10/25/2016  . 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  MEDICAL HISTORY: Past Medical History:  Diagnosis Date  . Allergy   . Cancer ([place]) 10/09/2016   rectal adenocarcinoma  . History of hiatal hernia     SURGICAL HISTORY: Past Surgical History:  Procedure Laterality Date  .  COLONOSCOPY W/ BIOPSIES Bilateral 10/09/2016   rectal adenocarcinoma  . MOUTH SURGERY     age 30, to remove extra "eye" teeth  . [place] teeth extraction    . XI ROBOT ABDOMINAL PERINEAL RESECTION N/A 02/21/2017   Procedure: XI ROBOT ABDOMINOPERINEAL RESECTION WITH COLOSTOMY;  Surgeon: Michael Boston, MD;  Location: [facility];  Service: General;  Laterality: N/A;    SOCIAL HISTORY: Social History   Social History  . Marital status: Single    Spouse name: N/A  . Number of children: 1  . Years of education: N/A   Occupational History  . restaurant/caterer    Social History Main Topics  . Smoking status: Former Smoker    Years: 2.00    Types: Cigarettes    Quit date: 2002  . Smokeless tobacco: Never Used  . Alcohol use 6.0 oz/week    4 Glasses of wine, 6 Cans of beer per week     Comment: beer or a glass or wine most days of the week  . Drug use: No  . Sexual activity: Yes    Partners: Female   Other Topics Concern  . Not on file   Social History Narrative  . No narrative on file  FAMILY HISTORY: Family History  Problem Relation Age of Onset  . Other Mother        bile duct tumor  . Stroke Mother   . Cancer Mother 23       bile duct cancer   . Prostate cancer Father 72  . Emphysema Father   . Colon cancer Neg Hx   . Esophageal cancer Neg Hx   . Pancreatic cancer Neg Hx   . Rectal cancer Neg Hx   . Stomach cancer Neg Hx     Review of Systems  Constitutional: Negative.   HENT:  Negative.   Eyes: Negative.   Respiratory: Negative.   Cardiovascular: Negative.   Gastrointestinal: Negative.   Endocrine: Negative.   Genitourinary: Negative.    Musculoskeletal: Negative.   Skin: Negative.   Neurological:       Numbness to his groin.  Hematological: Negative.   Psychiatric/Behavioral: Negative.      PHYSICAL EXAMINATION  ECOG PERFORMANCE STATUS: 1 - Symptomatic but completely ambulatory  Vitals:   04/11/17 1259  BP: 122/77  Pulse: 78  Resp: 20   Temp: 98.5 F (36.9 C)  SpO2: 100%    Physical Exam  Constitutional: He is oriented to person, place, and time and well-developed, well-nourished, and in no distress. No distress.  HENT:  Head: Normocephalic and atraumatic.  Mouth/Throat: Oropharynx is clear and moist. No oropharyngeal exudate.  Eyes: Conjunctivae are normal. No scleral icterus.  Neck: Neck supple.  Cardiovascular: Normal rate, regular rhythm, normal heart sounds and intact distal pulses.   Pulmonary/Chest: Effort normal and breath sounds normal. No respiratory distress. He has no wheezes. He has no rales.  Abdominal: Soft. Bowel sounds are normal. He exhibits no distension and no mass. There is no tenderness.  Colostomy bag in the left upper quadrant.  Musculoskeletal: Normal range of motion. He exhibits no edema or tenderness.  Lymphadenopathy:    He has no cervical adenopathy.  Neurological: He is alert and oriented to person, place, and time. He exhibits normal muscle tone.  Skin: Skin is warm and dry. No rash noted. He is not diaphoretic. No erythema. No pallor.  Psychiatric: Mood, memory, affect and judgment normal.  Vitals reviewed.   LABORATORY DATA:  CBC    Component Value Date/Time   WBC 6.5 04/11/2017 1240   WBC 9.8 02/22/2017 0409   RBC 3.70 (L) 04/11/2017 1240   RBC 3.20 (L) 02/22/2017 0409   HGB 10.0 (L) 04/11/2017 1240   HCT 30.7 (L) 04/11/2017 1240   PLT 577 (H) 04/11/2017 1240   MCV 82.9 04/11/2017 1240   MCH 27.1 (L) 04/11/2017 1240   MCH 27.8 02/22/2017 0409   MCHC 32.7 04/11/2017 1240   MCHC 31.0 02/22/2017 0409   RDW 18.1 (H) 04/11/2017 1240   LYMPHSABS 1.0 04/11/2017 1240   MONOABS 0.6 04/11/2017 1240   EOSABS 0.0 04/11/2017 1240   BASOSABS 0.0 04/11/2017 1240    CMP     Component Value Date/Time   NA 138 04/11/2017 1240   K 4.1 04/11/2017 1240   CL 105 02/22/2017 0409   CO2 25 04/11/2017 1240   GLUCOSE 105 04/11/2017 1240   BUN 15.0 04/11/2017 1240   CREATININE 0.8  04/11/2017 1240   CALCIUM 9.6 04/11/2017 1240   PROT 7.4 04/11/2017 1240   ALBUMIN 3.1 (L) 04/11/2017 1240   AST 13 04/11/2017 1240   ALT 7 04/11/2017 1240   ALKPHOS 115 04/11/2017 1240   BILITOT 0.29 04/11/2017 1240   GFRNONAA >  60 02/22/2017 0409   GFRAA >60 02/22/2017 0409  CEA is pending  RADIOGRAPHIC STUDIES:  No results found.  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 We reviewed his image in our GI tumor Board, he also has a small liver lesion which is indeterminate, likely benign, and we will follow up with a repeated CT in about 3 months.  -patient received neoadjuvant chemotherapy and radiation with Xeloda, which was held for the last few weeks due to infectious colitis. -He underwent APR on 02/21/2017, surgical margins were negative, all lymph nodes were negative. We've reviewed his surgical pathology findings he did have significant treatment effect on his surgical sample.  - We discussed the role of adjuvant chemotherapy. Given his stage III B disease, I recommend adjuvant FOLFOX or CAPOX. The patient opted CAPOX, which is every 3 weeks, for total 6 cycles.  -He has recovered well. Startedadjuvant CAPOX on 04/04/2017.  -First cycle of oxaliplatin platinum by 15%, if he tolerates well, we'll increase to full dose from cycle 2. -tolerating his treatment well  Iron deficiency anemia due to chronic blood loss -We previously discussed that the patient's anemia could be playing a role in his fatigue. -Hemoglobin was 8.3 initially with low MCV, likely anemia of iron deficiency from chronic blood loss. This was confirmed by his iron study. -Anemia improved with IV iron  -His daughter was diagnosed with hemochromatosis (homozygous), his genetic testing showed he  is a carrier -He received IV iron once, I'll be cautious about IV iron replacement due to his hemachromatosis carrier status. -previously repeated iron study still showed low iron, his anemia has gotten worse lately, I previously recommend him to take oral iron 1-2 tab a day for a month.  -He will restart oral iron.  -I suggest he watches his BM while on oral iron.  -his repeat iron level on 03/14/17 is 15 with saturation 5%, ferritin is normal, I gave one dose IV iron infusion, anemia has improved - his Hg is 10.0 today (04/11/17)  Patient is tolerating treatment well. He requested a no to be sent to his local massage therapist that he may undergo deep tissue massage. This was faxed per his request to [phone]. Keep return appointments as previously scheduled. No orders of the defined types were placed in this encounter.   All questions were answered. The patient knows to call the clinic with any problems, questions or concerns. We can certainly see the patient much sooner if necessary. Mikey Bussing, NP 04/13/2017

## 2017-04-13 ENCOUNTER — Encounter: Payer: Self-pay | Admitting: Oncology

## 2017-04-13 NOTE — Assessment & Plan Note (Signed)
-  We previously discussed that the patient's anemia could be playing a role in his fatigue. -Hemoglobin was 8.3 initially with low MCV, likely anemia of iron deficiency from chronic blood loss. This was confirmed by his iron study. 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 -previously repeated iron study still showed low iron, his anemia has gotten worse lately, I previously recommend him to take oral iron 1-2 tab a day for a month.  -He will restart oral iron.  -I suggest he watches his BM while on oral iron.  -his repeat iron level on 03/14/17 is 15 with saturation 5%, ferritin is normal, I gave one dose IV iron infusion, anemia has improved - his Hg is 10.0 today (04/11/17)

## 2017-04-13 NOTE — Assessment & Plan Note (Signed)
-  I previously reviewed his CT and MRI scan, colonoscopy and biopsy findings in detail with pt and his family members -Based on his imaging findings, he has clinical stage III disease. We reviewed his image in our GI tumor Board, he also has a small liver lesion which is indeterminate, likely benign, and we will follow up with a repeated CT in about 3 months.  -patient received neoadjuvant chemotherapy and radiation with Xeloda, which was held for the last few weeks due to infectious colitis. -He underwent APR on 02/21/2017, surgical margins were negative, all lymph nodes were negative. We've reviewed his surgical pathology findings he did have significant treatment effect on his surgical sample.  - We discussed the role of adjuvant chemotherapy. Given his stage III B disease, I recommend adjuvant FOLFOX or CAPOX. The patient opted CAPOX, which is every 3 weeks, for total 6 cycles.  -He has recovered well. Startedadjuvant CAPOX on 04/04/2017.  -First cycle of oxaliplatin platinum by 15%, if he tolerates well, we'll increase to full dose from cycle 2. -tolerating his treatment well

## 2017-04-17 ENCOUNTER — Telehealth: Payer: Self-pay

## 2017-04-17 NOTE — Telephone Encounter (Signed)
Spoke with pt and informed him of CEA level and instructions from Dr. Burr Medico below.  Pt voiced understanding.

## 2017-04-17 NOTE — Telephone Encounter (Signed)
My nurse, please call him back, CEA was  5.87, stable, slightly above normal limits, continue monitoring. Thanks   Truitt Merle MD

## 2017-04-17 NOTE — Telephone Encounter (Signed)
Pt called with questions about his CEA results

## 2017-04-18 ENCOUNTER — Ambulatory Visit: Payer: BLUE CROSS/BLUE SHIELD | Admitting: Physical Therapy

## 2017-04-18 ENCOUNTER — Encounter: Payer: Self-pay | Admitting: Physical Therapy

## 2017-04-18 DIAGNOSIS — M6281 Muscle weakness (generalized): Secondary | ICD-10-CM

## 2017-04-18 DIAGNOSIS — R252 Cramp and spasm: Secondary | ICD-10-CM

## 2017-04-18 NOTE — Patient Instructions (Addendum)
Quick Contraction: Gravity Resisted (Sitting)    Sitting, quickly squeeze then fully relax pelvic floor. Perform _1__ sets of _5__. Rest for _1__ seconds between sets. Do _3__ times a day.  Copyright  VHI. All rights reserved.  Slow Contraction: Gravity Resisted (Sitting)    Sitting, slowly squeeze pelvic floor for _5__ seconds. Rest for _5__ seconds. Repeat _10__ times. Do __3_ times a day.  Copyright  VHI. All rights reserved.   Piriformis Stretch, Sitting    Sit, one ankle on opposite knee, same-side hand on crossed knee. Push down on knee, keeping spine straight. Lean torso forward, with flat back, until tension is felt in hamstrings and gluteals of crossed-leg side. Hold _30__ seconds.  Repeat _2__ times per session. Do _1__ sessions per day.  Copyright  VHI. All rights reserved.  Chair Sitting    Sit at edge of seat, spine straight, one leg extended. Put a hand on each thigh and bend forward from the hip, keeping spine straight. Allow hand on extended leg to reach toward toes. Support upper body with other arm. Hold _30__ seconds. Repeat _2__ times per session. Do _1__ sessions per day.  Copyright  VHI. All rights reserved.  Quads / HF, Standing    Stand, holding onto chair and grasping one foot with same-side hand. Pull heel toward buttock.squeeze buttocks Hold _30__ seconds.  Repeat _2__ times per session. Do _2__ sessions per day.  Copyright  VHI. All rights reserved.  Extensors / Rotators, Supine    Lie supine, one leg straight, other leg bent, knee held by opposite hand. Pull leg across body toward floor. Hold _30_ seconds.  Repeat _2__ times per session. Do 1___ sessions per day.  Copyright  VHI. All rights reserved.    1. Position yourself as shown, grabbing onto the feet or behind the knees; you should feel a gentle stretch.  2. Breathe in and allow the pelvic floor muscles to relax.  3. Hold this position for 2-3 minutes.    Lying face  down, slowly press up and prop yourself up on your elbows. Hold 1 min.   Mid-Back Rotation Stretch    Reach to each side as far as possible, keeping chest low to floor. Hold __30__ seconds. Repeat __1__ times per set. Do ___1_ sets per session. Do __1__ sessions per day.  http://orth.exer.us/132   Copyright  VHI. All rights reserved.  Mid-Back Stretch    Push chest toward floor, reaching forward as far as possible. Hold __30__ seconds. Repeat _2___ times per set. Do 1____ sets per session. Do __1__ sessions per day.  http://orth.exer.us/130   Copyright  VHI. All rights reserved.  Down Dog    From lowered push-up position, exhale and press body back and hips up to inverted V position. Keep back straight, shoulders down, palms flat. Press heels toward floor. Hold for __30__ breaths.  http://[email or web address].exer.[email or web address]   Copyright  VHI. All rights reserved.  Angry Cat, All Fours    Kneel on hands and knees. Tuck chin and tighten stomach. Exhale and round back upward. Inhale and arch back downward. Hold each position _1__ seconds. Repeat _15__ times per session. Do _1__ sessions per day.  Copyright  VHI. All rights reserved.  [facility] [address] [place], [place] [address] Phone # [phone] Fax [phone]

## 2017-04-18 NOTE — Therapy (Addendum)
[facility] [address] [place], [place], [address] Phone: [phone]   Fax:  [phone]  Physical Therapy Treatment  Patient Details  Name: David Clarke MRN: [id number] Date of Birth: July 02, 1956 Referring Provider: Dr. Michael Boston  Encounter Date: 04/18/2017      PT End of Session - 04/18/17 1108    Visit Number 2   Date for PT Re-Evaluation 06/26/17   Authorization Type [employer]   Authorization - Visit Number 2   Authorization - Number of Visits 30   PT Start Time 3235   PT Stop Time 1145   PT Time Calculation (min) 40 min   Activity Tolerance Patient tolerated treatment well   Behavior During Therapy [facility] for tasks assessed/performed      Past Medical History:  Diagnosis Date  . Allergy   . Cancer ([place]) 10/09/2016   rectal adenocarcinoma  . History of hiatal hernia     Past Surgical History:  Procedure Laterality Date  . COLONOSCOPY W/ BIOPSIES Bilateral 10/09/2016   rectal adenocarcinoma  . MOUTH SURGERY     age 50, to remove extra "eye" teeth  . [place] teeth extraction    . XI ROBOT ABDOMINAL PERINEAL RESECTION N/A 02/21/2017   Procedure: XI ROBOT ABDOMINOPERINEAL RESECTION WITH COLOSTOMY;  Surgeon: Michael Boston, MD;  Location: [facility];  Service: General;  Laterality: N/A;    There were no vitals filed for this visit.      Subjective Assessment - 04/18/17 1108    Subjective I am having chemotherapy and it has decreased my energy level.  I have been active every day.  I may not be walking daily.  Pain with sitting has decreased with not using inflatable donut anymore.    Patient Stated Goals reduce pain; normal sexual function; increase strength   Currently in Pain? Yes   Pain Score 1   worse 3/10   Pain Location Rectum   Pain Orientation Mid   Pain Descriptors / Indicators Discomfort   Pain Type Acute pain   Pain Onset More than a month ago   Pain Frequency Intermittent   Aggravating  Factors  sitting   Pain Relieving Factors position change, pain med   Multiple Pain Sites No                                 PT Education - 04/18/17 1138    Education provided Yes   Education Details stretches and pelvic floor contraction   Person(s) Educated Patient   Methods Explanation;Demonstration;Verbal cues;Handout   Comprehension Returned demonstration;Verbalized understanding          PT Short Term Goals - 04/18/17 1143      PT SHORT TERM GOAL #1   Title independent with flexibility exercises to improve elongation of the hip and thigh muscles   Time 4   Period Weeks   Status On-going     PT SHORT TERM GOAL #2   Title abilty to sit on bilateral buttocks for 30 minutes with pain decreased >/= 25%   Time 4   Period Weeks   Status Achieved     PT SHORT TERM GOAL #3   Title ability for the penis to slightly become erect due to increased pelvic floor strength and improved tissue mobility   Time 4   Period Weeks   Status On-going           PT Long  Term Goals - 04/03/17 1641      PT LONG TERM GOAL #1   Title independent with HEP and understand how to progress himself   Time 12   Period Weeks   Status New   Target Date 06/26/17     PT LONG TERM GOAL #2   Title pain with sitting on bilateral buttocks for 30 minutes with decreased >/= 75% due to tissue healing and improved tissue mobility   Time 12   Period Weeks   Status New   Target Date 06/26/17     PT LONG TERM GOAL #3   Title ability to have an erection due to improved pelvic floor strength and improved pelvic floor tissue mobility   Time 12   Period Weeks   Status New   Target Date 06/26/17     PT LONG TERM GOAL #4   Title ability to lift 25# with correct bodymechanics due to LE strength >/= 4+/5   Time 12   Period Weeks   Status New   Target Date 06/26/17               Plan - 04/18/17 1112    Clinical Impression Statement Patient is having less pain in  buttocks and not using the donut to sit on now.  Patient has learned stretches to improve his flexibility.  Patient has tightness in legs and back.  Patient is now having  chemotherapy and has decreased energy.  Patient will benefit from skilled therapy to improve strength and endurance to improve function and sexual function.    Rehab Potential Excellent   Clinical Impairments Affecting Rehab Potential s/p recta adenocarinoma on 10/09/2016; colonscopy bag; Chemotherapy with radiation 11/08/2016-12/19/2016; starting another round of chemotherapy next week   PT Frequency 1x / week   PT Duration 12 weeks   PT Treatment/Interventions Biofeedback;Therapeutic activities;Therapeutic exercise;Neuromuscular re-education;Patient/family education;Passive range of motion;Scar mobilization;Manual techniques;Dry needling   PT Next Visit Plan  pelvic EMG; rolling of muscles; Dry needling to hamstring; lumbar mobilization   PT Home Exercise Plan hip stretches; quick flicks   Recommended Other Services MD signed initial eval   Consulted and Agree with Plan of Care Patient      Patient will benefit from skilled therapeutic intervention in order to improve the following deficits and impairments:  Pain, Decreased strength, Decreased mobility, Decreased activity tolerance, Decreased endurance, Decreased range of motion, Increased fascial restricitons, Increased muscle spasms  Visit Diagnosis: Muscle weakness (generalized)  Cramp and spasm     Problem List Patient Active Problem List   Diagnosis Date Noted  . 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 Fever   . Dehydration   . Diarrhea   . 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 Hypokalemia 12/03/2016  . Leukopenia 12/03/2016  . Prolonged Q-T interval on ECG 12/03/2016  . Oral thrush 12/03/2016  . Hemochromatosis 11/19/2016  . Family history of hemochromatosis 10/25/2016  . Iron deficiency anemia due to chronic blood loss  10/25/2016  . 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 10/05/2016    Earlie Counts, PT 04/18/17 11:46 AM   [facility] [address] [place], [place], [address] Phone: [phone]   Fax:  [phone]  Name: David Clarke MRN: [id number] Date of Birth: Nov 09, 1955 PHYSICAL THERAPY DISCHARGE SUMMARY  Visits from Start of Care: 2  Current functional level related to goals / functional outcomes: See above. Patient has not attended therapy since his last visit on 04/18/2017.  Remaining deficits: See above.   Education / Equipment: HEP  Plan:                                                    Patient goals were not met. Patient is being discharged due to not returning since the last visit.  Thank you for the referral. Earlie Counts, PT 06/28/17 2:43 PM  ?????

## 2017-04-19 ENCOUNTER — Encounter: Payer: BLUE CROSS/BLUE SHIELD | Admitting: Physical Therapy

## 2017-04-25 ENCOUNTER — Encounter: Payer: Self-pay | Admitting: Hematology

## 2017-04-25 ENCOUNTER — Telehealth: Payer: Self-pay | Admitting: Hematology

## 2017-04-25 ENCOUNTER — Ambulatory Visit (HOSPITAL_BASED_OUTPATIENT_CLINIC_OR_DEPARTMENT_OTHER): Payer: BLUE CROSS/BLUE SHIELD

## 2017-04-25 ENCOUNTER — Ambulatory Visit (HOSPITAL_BASED_OUTPATIENT_CLINIC_OR_DEPARTMENT_OTHER): Payer: BLUE CROSS/BLUE SHIELD | Admitting: Hematology

## 2017-04-25 ENCOUNTER — Encounter: Payer: BLUE CROSS/BLUE SHIELD | Admitting: Physical Therapy

## 2017-04-25 ENCOUNTER — Other Ambulatory Visit (HOSPITAL_BASED_OUTPATIENT_CLINIC_OR_DEPARTMENT_OTHER): Payer: BLUE CROSS/BLUE SHIELD

## 2017-04-25 VITALS — BP 122/78 | HR 82 | Temp 98.4°F | Resp 18 | Ht 72.0 in | Wt 142.3 lb

## 2017-04-25 DIAGNOSIS — C20 Malignant neoplasm of rectum: Secondary | ICD-10-CM | POA: Diagnosis not present

## 2017-04-25 DIAGNOSIS — Z5111 Encounter for antineoplastic chemotherapy: Secondary | ICD-10-CM

## 2017-04-25 DIAGNOSIS — R5383 Other fatigue: Secondary | ICD-10-CM

## 2017-04-25 DIAGNOSIS — D500 Iron deficiency anemia secondary to blood loss (chronic): Secondary | ICD-10-CM

## 2017-04-25 LAB — COMPREHENSIVE METABOLIC PANEL
ALBUMIN: 3.1 g/dL — AB (ref 3.5–5.0)
ALK PHOS: 126 U/L (ref 40–150)
ALT: 17 U/L (ref 0–55)
AST: 19 U/L (ref 5–34)
Anion Gap: 10 mEq/L (ref 3–11)
BILIRUBIN TOTAL: 0.3 mg/dL (ref 0.20–1.20)
BUN: 12.8 mg/dL (ref 7.0–26.0)
CALCIUM: 9.6 mg/dL (ref 8.4–10.4)
CO2: 24 mEq/L (ref 22–29)
Chloride: 103 mEq/L (ref 98–109)
Creatinine: 0.8 mg/dL (ref 0.7–1.3)
GLUCOSE: 122 mg/dL (ref 70–140)
POTASSIUM: 3.7 meq/L (ref 3.5–5.1)
Sodium: 138 mEq/L (ref 136–145)
TOTAL PROTEIN: 7.5 g/dL (ref 6.4–8.3)

## 2017-04-25 LAB — CBC WITH DIFFERENTIAL/PLATELET
BASO%: 0.2 % (ref 0.0–2.0)
BASOS ABS: 0 10*3/uL (ref 0.0–0.1)
EOS ABS: 0.1 10*3/uL (ref 0.0–0.5)
EOS%: 1 % (ref 0.0–7.0)
HEMATOCRIT: 32.4 % — AB (ref 38.4–49.9)
HEMOGLOBIN: 10.1 g/dL — AB (ref 13.0–17.1)
LYMPH#: 0.8 10*3/uL — AB (ref 0.9–3.3)
LYMPH%: 15.6 % (ref 14.0–49.0)
MCH: 27.2 pg (ref 27.2–33.4)
MCHC: 31.2 g/dL — ABNORMAL LOW (ref 32.0–36.0)
MCV: 87.3 fL (ref 79.3–98.0)
MONO#: 0.6 10*3/uL (ref 0.1–0.9)
MONO%: 11.7 % (ref 0.0–14.0)
NEUT%: 71.5 % (ref 39.0–75.0)
NEUTROS ABS: 3.5 10*3/uL (ref 1.5–6.5)
PLATELETS: 306 10*3/uL (ref 140–400)
RBC: 3.71 10*6/uL — ABNORMAL LOW (ref 4.20–5.82)
RDW: 20.5 % — AB (ref 11.0–14.6)
WBC: 4.9 10*3/uL (ref 4.0–10.3)

## 2017-04-25 MED ORDER — DEXAMETHASONE SODIUM PHOSPHATE 10 MG/ML IJ SOLN
10.0000 mg | Freq: Once | INTRAMUSCULAR | Status: AC
  Administered 2017-04-25: 10 mg via INTRAVENOUS

## 2017-04-25 MED ORDER — PALONOSETRON HCL INJECTION 0.25 MG/5ML
0.2500 mg | Freq: Once | INTRAVENOUS | Status: AC
  Administered 2017-04-25: 0.25 mg via INTRAVENOUS

## 2017-04-25 MED ORDER — PALONOSETRON HCL INJECTION 0.25 MG/5ML
INTRAVENOUS | Status: AC
  Filled 2017-04-25: qty 5

## 2017-04-25 MED ORDER — DEXAMETHASONE SODIUM PHOSPHATE 10 MG/ML IJ SOLN
INTRAMUSCULAR | Status: AC
  Filled 2017-04-25: qty 1

## 2017-04-25 MED ORDER — DEXTROSE 5 % IV SOLN
Freq: Once | INTRAVENOUS | Status: AC
  Administered 2017-04-25: 11:00:00 via INTRAVENOUS

## 2017-04-25 MED ORDER — OXALIPLATIN CHEMO INJECTION 100 MG/20ML
130.0000 mg/m2 | Freq: Once | INTRAVENOUS | Status: AC
  Administered 2017-04-25: 235 mg via INTRAVENOUS
  Filled 2017-04-25: qty 40

## 2017-04-25 NOTE — Progress Notes (Addendum)
[place]  Telephone:([phone] [phone]  Clinic Follow Up Note   Patient Care Team: Robyn Haber, MD as PCP - General (Family Medicine) Michael Boston, MD as Consulting Physician (General Surgery) Pyrtle, Lajuan Lines, MD as Consulting Physician (Gastroenterology) Truitt Merle, MD as Consulting Physician (Hematology and Oncology) Kyung Rudd, MD as Consulting Physician (Radiation Oncology) 04/25/2017  CHIEF COMPLAINTS:  Follow up rectal adenocarcinoma  Oncology History   Cancer Staging Rectal adenocarcinoma  Staging form: Colon and Rectum, AJCC 8th Edition - Clinical stage from 10/09/2016: Stage IIIB (cT3, cN1b, cM0) - Signed by Truitt Merle, MD on 10/25/2016       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   10/03/2016 - 10/03/2016 Hospital Admission    Admit date: 10/03/16 The patient presented to the [facility] ED with rectal bleeding and difficulty with hygiene.      10/09/2016 Initial Diagnosis    Rectal adenocarcinoma       10/09/2016 Procedure    Colonoscopy showed a fungating, infiltrative and ulcerated partially obstructing large mass in the distal rectum, measuring 9 cm, prolapsing at anal canal. Scope not advanced proximal to the rectosigmoid colon.      10/09/2016 Initial Biopsy    Rectal mass biopsy showed invasive adenocarcinoma.      10/13/2016 Imaging    CT Chest Abdomen Pelvis w contrast IMPRESSION: Large irregular enhancing rectal mass consistent with known rectal cancer. There are small perirectal and small pelvic sidewall lymph nodes which are suspicious. Prominent perirectal vessels are also noted. No evidence of metastatic retroperitoneal or sigmoid mesocolon adenopathy or hepatic or pulmonary metastasis.      10/24/2016 Imaging    MRI Pelvis IMPRESSION: 1. Moderately motion degraded exam. 2. Large mass is centered at the anus with extension into the low rectum. No complicating obstruction. 3. Mild mesorectal adenopathy,  suspicious. 4. Right inguinal hernia containing nonobstructive small bowel.       11/08/2016 - 12/19/2016 Chemotherapy    Xeloda 50 mg twice daily, with concurrent radiation, held since 12/03/2016 due to colitis and hospitalization      11/08/2016 - 12/19/2016 Radiation Therapy    Neoadjuvant radiation to his rectal cancer  1) Pelvis/ 45 Gy in 25 fractions  2) Rectum boost/ 9 Gy in 5 fractions Under the care of Dr. Lisbeth Renshaw.      12/03/2016 - 12/09/2016 Hospital Admission    He presented with few days to one week history of diarrhea with 6-7 loose stools daily, along with a fever of 101.5 on the day of admission. Patient was also noted to be hypokalemic, hyponatremic. Hospitalized for further management. Stool studies were sent. C. difficile was negative. GI pathogen panel was positive for Yersinia.  XELODA was held at this time, until infection has cleared.       12/07/2016 Imaging    CT A/P W Contrast  IMPRESSION: 1. Abnormal dilatation of the proximal small bowel loops worrisome for either small bowel obstruction versus small bowel ileus. 2. 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 3. Large right inguinal hernia containing edematous loop of distal small bowel 4. No pneumatosis or bowel perforation.  No abscess. 5. Aortic atherosclerosis 6. Mild decrease in size of rectal mass. No change in right common iliac adenopathy.      02/21/2017 Surgery    XI ROBOT ABDOMINOPERINEAL RESECTION WITH COLOSTOMY by Dr. Johney Maine      02/21/2017 Pathology Results  Diagnosis 02/21/17 1. Colon, segmental resection for tumor, rectosigmoid - INVASIVE ADENOCARCINOMA, MODERATELY DIFFERENTIATED, SPANNING 3.9 CM. - TUMOR INVADES THROUGH MUSCULARIS PROPRIA. - RESECTION MARGINS ARE NEGATIVE. - EIGHT OF EIGHT LYMPH NODES NEGATIVE FOR CARCINOMA (0/8), SEE COMMENT. - SEE ONCOLOGY TABLE. 2. Soft tissue, biopsy,  left lateral pelvic floor - BENIGN SKELETAL MUSCLE AND SOFT TISSUE. - NO MALIGNANCY IDENTIFIED. 3. Colon, segmental resection, proximal sigmoid - BENIGN COLONIC MUCOSA. - NO DYSPLASIA OR MALIGNANCY. 4. Liver, biopsy, liver mass - BENIGN VASCULAR PROLIFERATION CONSISTENT WITH HEMANGIOMA. - NO MALIGNANCY IDENTIFIED.      04/04/2017 -  Chemotherapy    CAPOX every 3 weeks for 6 cycles      HISTORY OF PRESENTING ILLNESS (10/25/16):  David Clarke 61 y.o. male is here because of his recently diagnosed rectal adenocarcinoma. He was referred by his gastroenterologist title parietal. Patient presents to my clinic, accompanied by his wife and his daughter.  He has noticed a anal mass accompanied by intermittent bleeding and pain in the rectum for the past 5 months. The patient initially presented to the [facility] ED on 10/03/16 with rectal bleeding and difficulty with hygeine. He was without health insurance previously and so did not pursue evaluation or treatment until this time.  The patient was seen by Dr. Hilarie Fredrickson for a colonoscopy and biopsy on 10/09/16, revealing a large partially obstructing low rectal mass, prolapsing to anal verge. Biopsy showed adenocarcinoma. CT C/A/P on 10/13/16 revealed a large, irregular, enhancing mass consistent with known rectal cancer. There were several suspicious small perirectal and small pelvic sidewall lymph nodes. Prominent perirectal vessels were also noted. MRI of the pelvis on 10/24/16 showed a large mass centered at the anus with extension into the low rectum without obstruction. There was suspicious mild mesorectal adenopathy, and a right inguinal hernia containing nonobstructive small bowel.   The patient comes to the clinic for new consultation accompanied by his partner and daughter. He denies abdominal cramps, nausea, bowel or bladder concerns. He reports taking OTC stool softener as needed. He reports his appetite is good, but he has lost 12-15 lbs in the past few  weeks. His daughter notes she has hemochromatosis and questions if this could be playing a role in the patient's anemia.  CURRENT THERAPY:  Adjuvant CAPOX every 3 weeks for 6 cycles, starting 04/04/17   INTERIM HISTORY: Murriel Eidem presents for follow up and cycle 2 CAPOX. He presents to the clinic today with his wife. He received cycle 1 CAPOX on 04/04/2017 with a slight dose reduction in oxaliplatin. He is taking 4 tablets in the morning and 3 in the evening of Xeloda. He tolerated the first cycle fairly well, with about 5 days of "fogginess," decreased appetite and fatigue, and 3 days of cold sensitivity related to the oxaliplatin. He did have some throat tightness with cold liquids on day 2. He reports no nausea today and his appetite and energy level has mostly returned. He has been able to tolerate working full days recently. He has intermittent mild tingling in his feet, no worse today, and does not interfere with balance or ambulation. He has chronic clear rhinorrhea, he denies fever or chills. He has low output from his colostomy bag, producing pasty stool approximately every 2-3 days. He increase his fiber and uses a laxative regularly.  Mr. Mcgrail continues to have rectal pain with prolonged sitting, rating his pain at a 3-4 out of 10. Since weaning off tramadol 3 weeks ago, he has been taking approximately  3 g of Tylenol daily. He reports the tramadol made him foggy and constipated. He continues to go to physical therapy and performs Kegel exercises and notes slow but continued improvement in numbness.    MEDICAL HISTORY:  Past Medical History:  Diagnosis Date   Allergy    Cancer ([place]) 10/09/2016   rectal adenocarcinoma   History of hiatal hernia     SURGICAL HISTORY: Past Surgical History:  Procedure Laterality Date   COLONOSCOPY W/ BIOPSIES Bilateral 10/09/2016   rectal adenocarcinoma   MOUTH SURGERY     age 78, to remove extra "eye" teeth   [place] teeth extraction     XI  ROBOT ABDOMINAL PERINEAL RESECTION N/A 02/21/2017   Procedure: XI ROBOT ABDOMINOPERINEAL RESECTION WITH COLOSTOMY;  Surgeon: Michael Boston, MD;  Location: [facility];  Service: General;  Laterality: N/A;    SOCIAL HISTORY: Social History   Social History   Marital status: Single    Spouse name: N/A   Number of children: 1   Years of education: N/A   Occupational History   Architectural technologist    Social History Main Topics   Smoking status: Former Smoker    Years: 2.00    Types: Cigarettes    Quit date: 2002   Smokeless tobacco: Never Used   Alcohol use 6.0 oz/week    4 Glasses of wine, 6 Cans of beer per week     Comment: beer or a glass or wine most days of the week   Drug use: No   Sexual activity: Yes    Partners: Female   Other Topics Concern   Not on file   Social History Narrative   No narrative on file    FAMILY HISTORY: Family History  Problem Relation Age of Onset   Other Mother        bile duct tumor   Stroke Mother    Cancer Mother 45       bile duct cancer    Prostate cancer Father 39   Emphysema Father    Colon cancer Neg Hx    Esophageal cancer Neg Hx    Pancreatic cancer Neg Hx    Rectal cancer Neg Hx    Stomach cancer Neg Hx     ALLERGIES:  is allergic to sulfa antibiotics.  MEDICATIONS:  Current Outpatient Prescriptions  Medication Sig Dispense Refill   acetaminophen (TYLENOL) 500 MG tablet Take 1,000 mg by mouth 3 (three) times daily.     capecitabine (XELODA) 500 MG tablet Take 4 tab in the morning and 3 tab in the evening, on days 1-14 of chemotherapy every 21 days. 98 tablet 4   Multiple Vitamin (MULTIVITAMIN WITH MINERALS) TABS tablet Take 1 tablet by mouth 3 (three) times a week.     selenium 50 MCG TABS tablet Take 50 mcg by mouth 3 (three) times a week.     ondansetron (ZOFRAN) 8 MG tablet Take 1 tablet (8 mg total) by mouth 2 (two) times daily as needed for refractory nausea / vomiting. Start on day 3 after  chemotherapy. (Patient not taking: Reported on 04/04/2017) 30 tablet 1   Oral Wound Care Products (MUGARD) LIQD Use as directed 5 mLs in the mouth or throat every 4 (four) hours as needed. (Patient not taking: Reported on 02/15/2017) 240 mL 1   prochlorperazine (COMPAZINE) 10 MG tablet Take 1 tablet (10 mg total) by mouth every 6 (six) hours as needed (Nausea or vomiting). (Patient not taking: Reported on 04/04/2017) 30 tablet  1   Current Facility-Administered Medications  Medication Dose Route Frequency Provider Last Rate Last Dose   0.9 %  sodium chloride infusion  500 mL Intravenous Continuous Pyrtle, Lajuan Lines, MD        REVIEW OF SYSTEMS:  Constitutional: Denies increased fatigue, fevers, chills or abnormal night sweats (+) purposeful weight gain  Eyes: Denies blurriness of vision, double vision or watery eyes Ears, nose, mouth, throat, and face: Denies mucositis or sore throat  Respiratory: Denies cough, dyspnea or wheezes Cardiovascular: Denies palpitation, chest discomfort or lower extremity swelling Gastrointestinal:  Denies nausea, cramping, abdominal pain, heartburn or change in bowel habits (+) colostomy bag, BM every 2-3 days Skin: (+) skin discoloration related to radiation therapy Lymphatics: Denies new lymphadenopathy or easy bruising MSK: (+) post-op throbbing rectal pain 3-4/10, improving  Neurological: (+) intermittent tingling bilaterally to feet, no numbness. Denies numbness, tingling in hands or new weaknesses Behavioral/Psych: Mood is stable, no new changes  All other systems were reviewed with the patient and are negative.   PHYSICAL EXAMINATION: ECOG PERFORMANCE STATUS: 1 - Symptomatic but completely ambulatory  BP 122/78 (BP Location: Left Arm, Patient Position: Sitting)    Pulse 82    Temp 98.4 F (36.9 C) (Oral)    Resp 18    Ht 6' (1.829 m)    Wt 142 lb 4.8 oz (64.5 kg)    SpO2 99%    BMI 19.30 kg/m   GENERAL: alert, no distress and comfortable.  HEENT: oral  mucosa is pink and moist, no oral lesions, ulcers, or thrush. No scleral icterus.  SKIN: skin color, texture, turgor are normal, no rashes or significant lesions NECK: supple, thyroid normal size, non-tender, without nodularity LYMPH: no palpable cervical or supraclavicular lymphadenopathy LUNGS: clear to auscultation bilaterally, normal breathing effort HEART: regular rate & rhythm, S1 and S2 present, no murmurs. No lower extremity edema ABDOMEN: abdomen soft, non-tender and normal bowel sounds (+) LUQ colostomy, (+) laparoscopic abdominal incisions all healed Musculoskeletal: no cyanosis of digits and no clubbing  PSYCH: alert & oriented x 3 with fluent speech NEURO: no focal motor/sensory deficits.  RECTAL:  Not visualized during today's exam.    LABORATORY DATA:  I have reviewed the data as listed  CBC Latest Ref Rng & Units 04/25/2017 04/11/2017 04/04/2017  WBC 4.0 - 10.3 10e3/uL 4.9 6.5 7.3  Hemoglobin 13.0 - 17.1 g/dL 10.1(L) 10.0(L) 10.1(L)  Hematocrit 38.4 - 49.9 % 32.4(L) 30.7(L) 31.8(L)  Platelets 140 - 400 10e3/uL 306 577(H) 529(H)   CMP Latest Ref Rng & Units 04/25/2017 04/11/2017 04/04/2017  Glucose 70 - 140 mg/dl 122 105 109  BUN 7.0 - 26.0 mg/dL 12.8 15.0 15.6  Creatinine 0.7 - 1.3 mg/dL 0.8 0.8 0.7  Sodium 136 - 145 mEq/L 138 138 136  Potassium 3.5 - 5.1 mEq/L 3.7 4.1 4.0  Chloride 101 - 111 mmol/L - - -  CO2 22 - 29 mEq/L _0 Calcium 8.4 - 10.4 mg/dL 9.6 9.6 9.5  Total Protein 6.4 - 8.3 g/dL 7.5 7.4 7.7  Total Bilirubin 0.20 - 1.20 mg/dL 0.30 0.29 0.31  Alkaline Phos 40 - 150 U/L 126 115 123  AST 5 - 34 U/L _1 ALT 0 - 55 U/L _2 CEA ([facility]) 10/27/2016: 7.81 11/27/2016: 5.56 01/10/2017: 5.61 03/14/2017: 5.11 04/11/2017: 5.87   PATHOLOGY:   Diagnosis 02/21/17 1. Colon, segmental resection for tumor, rectosigmoid - INVASIVE ADENOCARCINOMA, MODERATELY DIFFERENTIATED, SPANNING 3.9 CM. -  TUMOR INVADES THROUGH MUSCULARIS PROPRIA. - RESECTION  MARGINS ARE NEGATIVE. - EIGHT OF EIGHT LYMPH NODES NEGATIVE FOR CARCINOMA (0/8), SEE COMMENT. - SEE ONCOLOGY TABLE. 2. Soft tissue, biopsy, left lateral pelvic floor - BENIGN SKELETAL MUSCLE AND SOFT TISSUE. - NO MALIGNANCY IDENTIFIED. 3. Colon, segmental resection, proximal sigmoid - BENIGN COLONIC MUCOSA. - NO DYSPLASIA OR MALIGNANCY. 4. Liver, biopsy, liver mass - BENIGN VASCULAR PROLIFERATION CONSISTENT WITH HEMANGIOMA. - NO MALIGNANCY IDENTIFIED. Microscopic Comment 1. COLON AND RECTUM (INCLUDING TRANS-ANAL RESECTION): Specimen: Rectosigmoid colon with anus. Procedure: Abdominoperineal resection. Tumor site: Distal 1/3 rectum to anus. Specimen integrity: Intact. Macroscopic intactness of mesorectum: Incomplete. Macroscopic tumor perforation: Not identified. Invasive tumor: Maximum size: 3.9 cm Histologic type(s): Invasive adenocarcinoma. Histologic grade and differentiation: G2: moderately differentiated/low grade Type of polyp in which invasive carcinoma arose: N/A. Microscopic extension of invasive tumor: Tumor invades through muscularis propria. Lymph-Vascular invasion: Not identified. 1 of 3 FINAL for JASDEEP, KEPNER [phone]) Microscopic Comment(continued) Peri-neural invasion: Present. Tumor deposit(s) (discontinuous extramural extension): Not identified. Resection margins: Proximal margin: Negative. Distal margin: Negative. Circumferential (radial) (posterior ascending, posterior descending; lateral and posterior mid-rectum; and entire lower 1/3 rectum): Negative. Mesenteric margin (sigmoid and transverse): Negative. Distance closest margin (if all above margins negative): <0.1 cm radial margin. >1 cm distal margin. Treatment effect (neo-adjuvant therapy): Present Additional polyp(s): N/A. Non-neoplastic findings: None. Lymph nodes: number examined 8; number positive: 0 (treatment effect present). Pathologic Staging: ypT3, ypN0, ypMX Ancillary studies: Can be  performed upon request. Comment: The fat was cleared and additional lymph nodes were not identified.  Diagnosis 10/09/2016 Rectum, biopsy, mass - ADENOCARCINOMA.  RADIOGRAPHIC STUDIES: I have personally reviewed the radiological images as listed and agreed with the findings in the report.  MRI Pelvis W WO Contrast 01/29/17 IMPRESSION: Significant decrease in size of soft tissue mass at the anorectal junction, with post radiation changes. No residual perirectal lymphadenopathy or other signs of pelvic metastatic disease. Stable small right inguinal hernia containing small bowel.  CT A/P W Contrast 12/07/16 IMPRESSION: 1. Abnormal dilatation of the proximal small bowel loops worrisome for either small bowel obstruction versus small bowel ileus. 2. 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 3. Large right inguinal hernia containing edematous loop of distal small bowel 4. No pneumatosis or bowel perforation.  No abscess. 5. Aortic atherosclerosis 6. Mild decrease in size of rectal mass. No change in right common iliac adenopathy.  DG Chest 2 View 12/03/2016 IMPRESSION: 1. No active cardiopulmonary disease. No evidence of pneumonia or pulmonary edema. No evidence of intrathoracic metastasis. 2. Hyperexpanded lungs suggesting COPD.  MRI Pelvis 10/24/2016 IMPRESSION: 1. Moderately motion degraded exam. 2. Large mass is centered at the anus with extension into the low rectum. No complicating obstruction. 3. Mild mesorectal adenopathy, suspicious. 4. Right inguinal hernia containing nonobstructive small bowel.  CT CAP w Contrast 10/13/2016 IMPRESSION: Large irregular enhancing rectal mass consistent with known rectal cancer. There are small perirectal and small pelvic sidewall lymph nodes which are suspicious. Prominent perirectal vessels are also noted. No evidence of metastatic  retroperitoneal or sigmoid mesocolon adenopathy or hepatic or pulmonary metastasis.  COLONOSOCPY 10/09/2016 Dr. Hilarie Fredrickson  A fungating, infiltrative and ulcerated partially obstructing large mass was found in the distal rectum. The mass was circumferential. The mass measured seven cm in length (prolapsing and from dentate line to approx 7 cm). In addition, its inner diameter measured nine mm. Oozing was present. This was  biopsied with a cold forceps for histology. - Due to the distal rectal mass the bowel preparation was poor and unable to be cleared via the adult upper endoscope (adult upper endoscope used due to obstructing mass). Scope not advanced proximal to the rectosigmoid colon.  ASSESSMENT & PLAN: 62 y.o.  gentleman, previously healthy, presented with a anal mass and a mild intermittent rectal bleeding for 5-6 months. Fatigue, anorexia, fatigue and weight loss for a month. He is s/p radiation therapy. Currently on CAPOX; Oxaliplatin evert 3 weeks. His dose was reduced with cycle one due to prolonged recovery phase from surgery. Xeloda 14 days on, 7 days off; patient is taking 4 tablets (2063m) in the morning, 3 tablets (15084m in the evening. He appears stable today and has gained weight.   1. Rectal Adenocarcinoma, low rectum, cT3N1bM0, ypT3N0, stage IIIB, MSI-pending  -MD previously reviewed his CT and MRI scan, colonoscopy and biopsy findings in detail with pt and his family members -Based on his imaging findings, he has clinical stage III disease. MD reviewed his image in our GI tumor Board, he also has a small liver lesion which is indeterminate, likely benign, and we will follow up with a repeated CT in about 3 months.  -patient received neoadjuvant chemotherapy and radiation with Xeloda, which was held for the last few weeks due to infectious colitis. -He underwent APR on 02/21/2017, surgical margins were negative, all lymph nodes were negative. We've reviewed his surgical pathology  findings he did have significant treatment effect on his surgical sample.  - MD discussed the role of adjuvant chemotherapy. Given his stage III B disease, MD recommend adjuvant FOLFOX or CAPOX. The patient opted CAPOX, which is every 3 weeks, for total 6 cycles.  -He has recovered well from surgery and began adjuvant CAPOX on 04/04/17. MD discussed again the benefit of this chemo therapy and the side effects such as the cold sensitivity and aches.  -For mild pain I suggest tylenol, recommended 1.5 grams per day and no more than 2 grams per day. He is a carrier for hemochromatosis, so he should avoid excessive use of tylenol. He may alternate with Ibuprofen as well.  -He tolerated cycle 1 of oxaliplatin at 15% dose-reduction. He will receive the full dose today going forward.  -He will begin another cycle of Xeloda today. -We reviewed his lab results and discussed very slight fluctuation in CEA result, but overall stable We will continue to monitor.   2. 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 -Hemoglobin was 8.3 initially with low MCV, likely anemia of iron deficiency from chronic blood loss. This was confirmed by his iron study. 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 -previously repeated iron study still showed low iron, his anemia has gotten worse lately, MD previously recommend him to take oral iron 1-2 tab a day for a month.  -He will restart oral iron.  -MD suggest he watches his BM while on oral iron.  -his repeat iron level on 03/14/17 is 15 with saturation 5%, ferritin is normal, MD gave one dose IV iron  infusion, anemia has improved -his Hg is 10.1 today, stable.  3.  Fatigue -Likely the combination of anemia, chemotherapy and radiation and surgery. -Encouraged him to exercise -Improving, he continues to walk and is tolerating occasional full days of work.  4. Physical therapy -Post surgery PT  -Numbness is improving -Continue PT with pelvic floor exercises  Plan -Proceed with cycle 2 Oxaliplatin today at full dose. -Restart Xeloda daily for the next 14 days then 7 days off; 4 tablets AM, 3 tablets PM -Continue PT -Reduce Tylenol intake to 1.5-2 grams maximum daily and alternate with ibuprofen for pain control  -Return in 3 weeks for lab follow-up and chemotherapy infusion -Call clinic with new or worsening concerns or questions  All questions were answered. The patient knows to call the clinic with any problems, questions or concerns.  I spent 25 minutes counseling the patient face to face. The total time spent in the appointment was 30 minutes and more than 50% was on counseling.  Cira Rue, AGNP-C 04/25/2017  This document serves as a record of services personally performed by Truitt Merle, MD. It was created on her behalf by Joslyn Devon, a trained medical scribe. The creation of this record is based on the scribe's personal observations and the provider's statements to them. This document has been checked and approved by the attending provider.   Addendum I have seen the patient, examined him. I agree with the assessment and and plan and have edited the notes.    Truitt Merle, MD 04/25/2017

## 2017-04-25 NOTE — Telephone Encounter (Signed)
Scheduled appt per 8/29 los - patient to get new schedule next treatment day 9/19

## 2017-04-25 NOTE — Patient Instructions (Signed)
[facility] Discharge Instructions for Patients Receiving Chemotherapy  Today you received the following chemotherapy agents oxaliplatin.  To help prevent nausea and vomiting after your treatment, we encourage you to take your nausea medication as directed.   If you develop nausea and vomiting that is not controlled by your nausea medication, call the clinic.   BELOW ARE SYMPTOMS THAT SHOULD BE REPORTED IMMEDIATELY:  *FEVER GREATER THAN 100.5 F  *CHILLS WITH OR WITHOUT FEVER  NAUSEA AND VOMITING THAT IS NOT CONTROLLED WITH YOUR NAUSEA MEDICATION  *UNUSUAL SHORTNESS OF BREATH  *UNUSUAL BRUISING OR BLEEDING  TENDERNESS IN MOUTH AND THROAT WITH OR WITHOUT PRESENCE OF ULCERS  *URINARY PROBLEMS  *BOWEL PROBLEMS  UNUSUAL RASH Items with * indicate a potential emergency and should be followed up as soon as possible.  Feel free to call the clinic you have any questions or concerns. The clinic phone number is [phone].  Please show the [facility] at check-in to the Emergency Department and triage nurse.

## 2017-04-26 ENCOUNTER — Ambulatory Visit: Payer: BLUE CROSS/BLUE SHIELD | Admitting: Physical Therapy

## 2017-05-02 ENCOUNTER — Encounter: Payer: BLUE CROSS/BLUE SHIELD | Admitting: Physical Therapy

## 2017-05-04 ENCOUNTER — Telehealth: Payer: Self-pay | Admitting: *Deleted

## 2017-05-04 NOTE — Telephone Encounter (Signed)
Pt would like to talk to Dr Burr Medico concerning his chemo tx plan and possible side effects to expect.  He states that he only needs about 15 min and would be available to speak on the phone if Dr prefers.  Pt's # [id number].

## 2017-05-07 ENCOUNTER — Telehealth: Payer: Self-pay | Admitting: *Deleted

## 2017-05-07 NOTE — Telephone Encounter (Signed)
Pt stated that he missed your call on Saturday.  Wanted to know if you could possible call him back at 5pm today, if so he would stick around the phone to receive the call.  He stated that he would only use about 15 minutes of your time.[place] Master # [phone]

## 2017-05-07 NOTE — Telephone Encounter (Signed)
I called back, reviewed his side effects from chemo. Pt may need to reschedule the next few treatments, and will send [place] message through my chart on that.   Truitt Merle MD

## 2017-05-09 ENCOUNTER — Encounter: Payer: BLUE CROSS/BLUE SHIELD | Admitting: Physical Therapy

## 2017-05-09 ENCOUNTER — Encounter: Payer: Self-pay | Admitting: Hematology

## 2017-05-10 ENCOUNTER — Encounter: Payer: Self-pay | Admitting: Hematology

## 2017-05-15 ENCOUNTER — Ambulatory Visit: Payer: BLUE CROSS/BLUE SHIELD | Admitting: Internal Medicine

## 2017-05-16 ENCOUNTER — Ambulatory Visit: Payer: BLUE CROSS/BLUE SHIELD

## 2017-05-16 ENCOUNTER — Other Ambulatory Visit: Payer: BLUE CROSS/BLUE SHIELD

## 2017-05-16 ENCOUNTER — Ambulatory Visit: Payer: BLUE CROSS/BLUE SHIELD | Admitting: Hematology

## 2017-05-17 ENCOUNTER — Encounter: Payer: BLUE CROSS/BLUE SHIELD | Admitting: Physical Therapy

## 2017-05-21 NOTE — Progress Notes (Signed)
[place]  Telephone:(336) [phone] Fax:(336) [phone]  Clinic Follow Up Note   Patient Care Team: Robyn Haber, MD as PCP - General (Family Medicine) Michael Boston, MD as Consulting Physician (General Surgery) Pyrtle, Lajuan Lines, MD as Consulting Physician (Gastroenterology) Truitt Merle, MD as Consulting Physician (Hematology and Oncology) Kyung Rudd, MD as Consulting Physician (Radiation Oncology) 05/23/2017  CHIEF COMPLAINTS:  Follow up rectal adenocarcinoma  Oncology History   Cancer Staging Rectal adenocarcinoma  Staging form: Colon and Rectum, AJCC 8th Edition - Clinical stage from 10/09/2016: Stage IIIB (cT3, cN1b, cM0) - Signed by Truitt Merle, MD on 10/25/2016       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      10/09/2016 Initial Diagnosis    Rectal adenocarcinoma       10/09/2016 Procedure    Colonoscopy showed a fungating, infiltrative and ulcerated partially obstructing large mass in the distal rectum, measuring 9 cm, prolapsing at anal canal. Scope not advanced proximal to the rectosigmoid colon.      10/09/2016 Initial Biopsy    Rectal mass biopsy showed invasive adenocarcinoma.      10/13/2016 Imaging    CT Chest Abdomen Pelvis w contrast IMPRESSION: Large irregular enhancing rectal mass consistent with known rectal cancer. There are small perirectal and small pelvic sidewall lymph nodes which are suspicious. Prominent perirectal vessels are also noted. No evidence of metastatic retroperitoneal or sigmoid mesocolon adenopathy or hepatic or pulmonary metastasis.      10/24/2016 Imaging    MRI Pelvis IMPRESSION: 1. Moderately motion degraded exam. 2. Large mass is centered at the anus with extension into the low rectum. No complicating obstruction. 3. Mild mesorectal adenopathy,  suspicious. 4. Right inguinal hernia containing nonobstructive small bowel.       11/08/2016 - 12/19/2016 Chemotherapy    Xeloda 50 mg twice daily, with concurrent radiation, held since 12/03/2016 due to colitis and hospitalization      11/08/2016 - 12/19/2016 Radiation Therapy    Neoadjuvant radiation to his rectal cancer  1) Pelvis/ 45 Gy in 25 fractions  2) Rectum boost/ 9 Gy in 5 fractions Under the care of Dr. Lisbeth Renshaw.      12/03/2016 - 12/09/2016 Hospital Admission    He presented with few days to one week history of diarrhea with 6-7 loose stools daily, along with a fever of 101.5 on the day of admission. Patient was also noted to be hypokalemic, hyponatremic. Hospitalized for further management. Stool studies were sent. C. difficile was negative. GI pathogen panel was positive for Yersinia.  XELODA was held at this time, until infection has cleared.       12/07/2016 Imaging    CT A/P W Contrast  IMPRESSION: 1. Abnormal dilatation of the proximal small bowel loops worrisome for either small bowel obstruction versus small bowel ileus. 2. 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 3. Large right inguinal hernia containing edematous loop of distal small bowel 4. No pneumatosis or bowel perforation.  No abscess. 5. Aortic atherosclerosis 6. Mild decrease in size of rectal mass. No change in right common iliac adenopathy.      02/21/2017 Surgery    XI ROBOT ABDOMINOPERINEAL RESECTION WITH COLOSTOMY by Dr. Johney Maine      02/21/2017 Pathology Results  Diagnosis 02/21/17 1. Colon, segmental resection for tumor, rectosigmoid - INVASIVE ADENOCARCINOMA, MODERATELY DIFFERENTIATED, SPANNING 3.9 CM. - TUMOR INVADES THROUGH MUSCULARIS PROPRIA. - RESECTION MARGINS ARE NEGATIVE. - EIGHT OF EIGHT LYMPH NODES NEGATIVE FOR CARCINOMA (0/8), SEE COMMENT. - SEE ONCOLOGY TABLE. 2. Soft tissue, biopsy,  left lateral pelvic floor - BENIGN SKELETAL MUSCLE AND SOFT TISSUE. - NO MALIGNANCY IDENTIFIED. 3. Colon, segmental resection, proximal sigmoid - BENIGN COLONIC MUCOSA. - NO DYSPLASIA OR MALIGNANCY. 4. Liver, biopsy, liver mass - BENIGN VASCULAR PROLIFERATION CONSISTENT WITH HEMANGIOMA. - NO MALIGNANCY IDENTIFIED.      04/04/2017 -  Chemotherapy    CAPOX every 3 weeks for 6 cycles      HISTORY OF PRESENTING ILLNESS (10/25/16):  [name] 61 y.o. male is here because of his recently diagnosed rectal adenocarcinoma. He was referred by his gastroenterologist title parietal. Patient presents to my clinic, accompanied by his wife and his daughter.       He has noticed a anal mass accompanied by intermittent bleeding and pain in the rectum for the past 5 months. The patient initially presented to the [facility] ED on 10/03/16 with rectal bleeding and difficulty with hygeine. He was without health insurance previously and so did not pursue evaluation or treatment until this time.  The patient was seen by Dr. Hilarie Fredrickson for a colonoscopy and biopsy on 10/09/16, revealing a large partially obstructing low rectal mass, prolapsing to anal verge. Biopsy showed adenocarcinoma. CT C/A/P on 10/13/16 revealed a large, irregular, enhancing mass consistent with known rectal cancer. There were several suspicious small perirectal and small pelvic sidewall lymph nodes. Prominent perirectal vessels were also noted. MRI of the pelvis on 10/24/16 showed a large mass centered at the anus with extension into the low rectum without obstruction. There was suspicious mild mesorectal adenopathy, and a right inguinal hernia containing nonobstructive small bowel.   The patient comes to the clinic for consultation today accompanied by his partner and daughter. He denies abdominal cramps, nausea, bowel or bladder concerns. He reports taking OTC stool softener as needed. He reports his appetite is good, but he has lost 12-15 lbs in the  past few weeks. His daughter notes she has hemochromatosis and questions if this could be playing a role in the patient's anemia.  CURRENT THERAPY:  Adjuvant CAPOX every 3 weeks for 6 cycles, started 04/04/17   INTERIM HISTORY: Dorion Petillo presents for follow up. He presents to the clinic today with his wife.  He reports to not having much pain anymore. He takes acetaminophen twice a week. He still has surgical discomfort when standing for a while. He works part time now on his project.  Xeloda side effects he says are discoloration on his hands, no skin peeling or cracks. His neuropathy is from the infusion like sensitivity that lasted 6-10 days.  He asked if his pill cycle stays the same if his infusion was pushed back. Today is his Day 8 of this cycle.  After infusion last time he had bad cramps in his calves for 5 days. And post infusion he had a flat appetite for 3 days.  Reports a URI that has now resolved other than drainage. He felt bad for 3 days with no fever. He is now overall asymptomatic.   Around the 17th of October and the following week he has work events. He reports it is harder to work post infusion. We will schedule around this for him.    MEDICAL HISTORY:  Past Medical History:  Diagnosis Date  . Allergy   . Cancer ([place]) 10/09/2016   rectal adenocarcinoma  . History of hiatal hernia     SURGICAL HISTORY: Past Surgical History:  Procedure Laterality Date  . COLONOSCOPY W/ BIOPSIES Bilateral 10/09/2016   rectal adenocarcinoma  . MOUTH SURGERY     age 30, to remove extra "eye" teeth  . [place] teeth extraction    . XI ROBOT ABDOMINAL PERINEAL RESECTION N/A 02/21/2017   Procedure: XI ROBOT ABDOMINOPERINEAL RESECTION WITH COLOSTOMY;  Surgeon: Michael Boston, MD;  Location: [facility];  Service: General;  Laterality: N/A;    SOCIAL HISTORY: Social History   Social History  . Marital status: Single    Spouse name: N/A  . Number of children: 1  . Years of education: N/A     Occupational History  . restaurant/caterer    Social History Main Topics  . Smoking status: Former Smoker    Years: 2.00    Types: Cigarettes    Quit date: 2002  . Smokeless tobacco: Never Used  . Alcohol use 6.0 oz/week    4 Glasses of wine, 6 Cans of beer per week     Comment: beer or a glass or wine most days of the week  . Drug use: No  . Sexual activity: Yes    Partners: Female   Other Topics Concern  . Not on file   Social History Narrative  . No narrative on file    FAMILY HISTORY: Family History  Problem Relation Age of Onset  . Other Mother        bile duct tumor  . Stroke Mother   . Cancer Mother 63       bile duct cancer   . Prostate cancer Father 30  . Emphysema Father   . Colon cancer Neg Hx   . Esophageal cancer Neg Hx   . Pancreatic cancer Neg Hx   . Rectal cancer Neg Hx   . Stomach cancer Neg Hx     ALLERGIES:  is allergic to sulfa antibiotics.  MEDICATIONS:  Current Outpatient Prescriptions  Medication Sig Dispense Refill  . acetaminophen (TYLENOL) 500 MG tablet Take 1,000 mg by mouth 3 (three) times daily.    . capecitabine (XELODA) 500 MG tablet Take 4 tab in the morning and 3 tab in the evening, on days 1-14 of chemotherapy every 21 days. 98 tablet 4  . Multiple Vitamin (MULTIVITAMIN WITH MINERALS) TABS tablet Take 1 tablet by mouth 3 (three) times a week.    . prochlorperazine (COMPAZINE) 10 MG tablet Take 1 tablet (10 mg total) by mouth every 6 (six) hours as needed (Nausea or vomiting). 30 tablet 1  . ondansetron (ZOFRAN) 8 MG tablet Take 1 tablet (8 mg total) by mouth 2 (two) times daily as needed for refractory nausea / vomiting. Start on day 3 after chemotherapy. (Patient not taking: Reported on 04/04/2017) 30 tablet 1  . Oral Wound Care Products [facility]) LIQD Use as directed 5 mLs in the mouth or throat every 4 (four) hours as needed. (Patient not taking: Reported on 02/15/2017) 240 mL 1   Current Facility-Administered Medications   Medication Dose Route Frequency Provider Last Rate Last Dose  . 0.9 %  sodium chloride infusion  500 mL Intravenous Continuous Pyrtle, Lajuan Lines, MD       Facility-Administered Medications Ordered in Other Visits  Medication Dose Route Frequency Provider Last Rate Last Dose  . dexamethasone (DECADRON) injection 10 mg  10 mg Intravenous Once  Ladell Pier, MD   10 mg at 05/23/17 1018  . oxaliplatin (ELOXATIN) 235 mg in dextrose 5 % 500 mL chemo infusion  130 mg/m2 (Treatment Plan Recorded) Intravenous Once Truitt Merle, MD        REVIEW OF SYSTEMS:   Constitutional: Denies fevers, chills or abnormal night sweats (+) purposeful weight gain  (+) fatigued (+) change in appetite  Eyes: Denies blurriness of vision, double vision or watery eyes Ears, nose, mouth, throat, and face: Denies mucositis or sore throat  Respiratory: Denies cough, dyspnea or wheezes Cardiovascular: Denies palpitation, chest discomfort or lower extremity swelling Gastrointestinal:  Denies nausea, heartburn or change in bowel habits (+) colostomy bag, BM every 2-3 days Skin: (+) skin discoloration on hands from Xeloda Lymphatics: Denies new lymphadenopathy or easy bruising MSK: (+) low throbbing buttock discomfort from surgery Neurological: Denies numbness, tingling or new weaknesses Behavioral/Psych: Mood is stable, no new changes  All other systems were reviewed with the patient and are negative.   PHYSICAL EXAMINATION: ECOG PERFORMANCE STATUS: 1 - Symptomatic but completely ambulatory  Vitals:   05/23/17 0837  BP: 127/88  Pulse: 69  Resp: 17  Temp: 98.8 F (37.1 C)  SpO2: 100%   Filed Weights   05/23/17 0837  Weight: 145 lb 11.2 oz (66.1 kg)     GENERAL: alert, no distress and comfortable.  HEENT: thrush is improved  SKIN: skin color, texture, turgor are normal, no rashes or significant lesions NECK: supple, thyroid normal size, non-tender, without nodularity LYMPH: no palpable lymphadenopathy in the  cervical, axillary or inguinal LUNGS: clear to auscultation and percussion with normal breathing effort HEART: regular rate & rhythm and no murmurs and no lower extremity edema ABDOMEN: abdomen soft, non-tender and normal bowel sounds (+) colostomy bag in left Upper Quadrant with pink liquid and no stool (+) laparoscopic incisions all healed Musculoskeletal: no cyanosis of digits and no clubbing  PSYCH: alert & oriented x 3 with fluent speech NEURO: no focal motor/sensory deficits.  RECTAL:   (+) reconstruction has healed without discharge or skin redness.    LABORATORY DATA:  I have reviewed the data as listed CBC Latest Ref Rng & Units 05/23/2017 04/25/2017 04/11/2017  WBC 4.0 - 10.3 10e3/uL 4.3 4.9 6.5  Hemoglobin 13.0 - 17.1 g/dL 11.5(L) 10.1(L) 10.0(L)  Hematocrit 38.4 - 49.9 % 36.2(L) 32.4(L) 30.7(L)  Platelets 140 - 400 10e3/uL 191 306 577(H)   CMP Latest Ref Rng & Units 05/23/2017 04/25/2017 04/11/2017  Glucose 70 - 140 mg/dl 131 122 105  BUN 7.0 - 26.0 mg/dL 10.8 12.8 15.0  Creatinine 0.7 - 1.3 mg/dL 0.8 0.8 0.8  Sodium 136 - 145 mEq/L 139 138 138  Potassium 3.5 - 5.1 mEq/L 3.8 3.7 4.1  Chloride 101 - 111 mmol/L - - -  CO2 22 - 29 mEq/L 23 24 25   Calcium 8.4 - 10.4 mg/dL 9.2 9.6 9.6  Total Protein 6.4 - 8.3 g/dL 7.5 7.5 7.4  Total Bilirubin 0.20 - 1.20 mg/dL 0.61 0.30 0.29  Alkaline Phos 40 - 150 U/L 146 126 115  AST 5 - 34 U/L 39(H) 19 13  ALT 0 - 55 U/L 51 17 7     CEA ([facility]) 10/27/2016: 7.81 11/27/2016: 5.56 01/10/2017: 5.61 03/14/17: 5.11 04/11/17: 5.87 05/23/17: PENDING    PATHOLOGY:   Diagnosis 02/21/17 1. Colon, segmental resection for tumor, rectosigmoid - INVASIVE ADENOCARCINOMA, MODERATELY DIFFERENTIATED, SPANNING 3.9 CM. - TUMOR INVADES THROUGH MUSCULARIS PROPRIA. - RESECTION MARGINS ARE NEGATIVE. - EIGHT  OF EIGHT LYMPH NODES NEGATIVE FOR CARCINOMA (0/8), SEE COMMENT. - SEE ONCOLOGY TABLE. 2. Soft tissue, biopsy, left lateral pelvic floor - BENIGN  SKELETAL MUSCLE AND SOFT TISSUE. - NO MALIGNANCY IDENTIFIED. 3. Colon, segmental resection, proximal sigmoid - BENIGN COLONIC MUCOSA. - NO DYSPLASIA OR MALIGNANCY. 4. Liver, biopsy, liver mass - BENIGN VASCULAR PROLIFERATION CONSISTENT WITH HEMANGIOMA. - NO MALIGNANCY IDENTIFIED. Microscopic Comment 1. COLON AND RECTUM (INCLUDING TRANS-ANAL RESECTION): Specimen: Rectosigmoid colon with anus. Procedure: Abdominoperineal resection. Tumor site: Distal 1/3 rectum to anus. Specimen integrity: Intact. Macroscopic intactness of mesorectum: Incomplete. Macroscopic tumor perforation: Not identified. Invasive tumor: Maximum size: 3.9 cm Histologic type(s): Invasive adenocarcinoma. Histologic grade and differentiation: G2: moderately differentiated/low grade Type of polyp in which invasive carcinoma arose: N/A. Microscopic extension of invasive tumor: Tumor invades through muscularis propria. Lymph-Vascular invasion: Not identified. 1 of 3 FINAL for AUSTAN, NICHOLL [phone]) Microscopic Comment(continued) Peri-neural invasion: Present. Tumor deposit(s) (discontinuous extramural extension): Not identified. Resection margins: Proximal margin: Negative. Distal margin: Negative. Circumferential (radial) (posterior ascending, posterior descending; lateral and posterior mid-rectum; and entire lower 1/3 rectum): Negative. Mesenteric margin (sigmoid and transverse): Negative. Distance closest margin (if all above margins negative): <0.1 cm radial margin. >1 cm distal margin. Treatment effect (neo-adjuvant therapy): Present Additional polyp(s): N/A. Non-neoplastic findings: None. Lymph nodes: number examined 8; number positive: 0 (treatment effect present). Pathologic Staging: ypT3, ypN0, ypMX Ancillary studies: Can be performed upon request. Comment: The fat was cleared and additional lymph nodes were not identified.  Diagnosis 10/09/2016 Rectum, biopsy, mass - ADENOCARCINOMA.  RADIOGRAPHIC  STUDIES: I have personally reviewed the radiological images as listed and agreed with the findings in the report.  MRI Pelvis W WO Contrast 01/29/17 IMPRESSION: Significant decrease in size of soft tissue mass at the anorectal junction, with post radiation changes. No residual perirectal lymphadenopathy or other signs of pelvic metastatic disease. Stable small right inguinal hernia containing small bowel.  CT A/P W Contrast 12/07/16 IMPRESSION: 1. Abnormal dilatation of the proximal small bowel loops worrisome for either small bowel obstruction versus small bowel ileus. 2. 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 3. Large right inguinal hernia containing edematous loop of distal small bowel 4. No pneumatosis or bowel perforation.  No abscess. 5. Aortic atherosclerosis 6. Mild decrease in size of rectal mass. No change in right common iliac adenopathy.  DG Chest 2 View 12/03/2016 IMPRESSION: 1. No active cardiopulmonary disease. No evidence of pneumonia or pulmonary edema. No evidence of intrathoracic metastasis. 2. Hyperexpanded lungs suggesting COPD.  MRI Pelvis 10/24/2016 IMPRESSION: 1. Moderately motion degraded exam. 2. Large mass is centered at the anus with extension into the low rectum. No complicating obstruction. 3. Mild mesorectal adenopathy, suspicious. 4. Right inguinal hernia containing nonobstructive small bowel.  CT CAP w Contrast 10/13/2016 IMPRESSION: Large irregular enhancing rectal mass consistent with known rectal cancer. There are small perirectal and small pelvic sidewall lymph nodes which are suspicious. Prominent perirectal vessels are also noted. No evidence of metastatic retroperitoneal or sigmoid mesocolon adenopathy or hepatic or pulmonary metastasis.  COLONOSOCPY 10/09/2016 Dr. Hilarie Fredrickson  A fungating, infiltrative and ulcerated partially obstructing  large mass was found in the distal rectum. The mass was circumferential. The mass measured seven cm in length (prolapsing and from dentate line to approx 7 cm). In addition, its inner diameter measured nine mm. Oozing was present. This was biopsied with a cold forceps for histology. - Due to the distal  rectal mass the bowel preparation was poor and unable to be cleared via the adult upper endoscope (adult upper endoscope used due to obstructing mass). Scope not advanced proximal to the rectosigmoid colon.  ASSESSMENT & PLAN: 61 y.o.  gentleman, previously healthy, presented with a anal mass and a mild intermittent rectal bleeding for 5-6 months. Fatigue, anorexia, fatigue and weight loss for a month. He is currently on radiation for his rectal cancer, chemotherapy has been held lately.  1. Rectal Adenocarcinoma, low rectum, cT3N1bM0, ypT3N0, stage IIIB, MSI-pending  -I previously reviewed his CT and MRI scan, colonoscopy and biopsy findings in detail with pt and his family members -Based on his imaging findings, he has clinical stage III disease. We reviewed his image in our GI tumor Board, he also has a small liver lesion which is indeterminate, likely benign, and we will follow up with a repeated CT in about 3 months.  -patient received neoadjuvant chemotherapy and radiation with Xeloda, which was held for the last few weeks due to infectious colitis. -He underwent APR on 02/21/2017, surgical margins were negative, all lymph nodes were negative. We've reviewed his surgical pathology findings he did have significant treatment effect on his surgical sample.  - We discussed the role of adjuvant chemotherapy. Given his stage III B disease, I recommend adjuvant FOLFOX or CAPOX. The patient opted CAPOX, which is every 3 weeks, for total 6 cycles.  -He recovered well from surgery, will start adjuvant CAPOX 04/04/17. I discussed again the benefit of this chemo therapy and the side effects such as the cold  sensitivity and aches.  -I decreased his first cycle of oxaliplatin platinum by 15%, he tolerated well, so we increased to full dose from cycle 2. -due to his working schedule, he wanted to postpone his cycle 3 and 4 oxaliplatin for week, he still on Xeloda every 3 weeks, -Started 3rd cycle Xeloda on 9/26. He will come in 10/8 for labs before cycle 4 Xeloda.  -Will proceed with 4th Oxaliplatin infusion on 10/23 -His CEA has been mildly elevated, but stable over the last few months.  -Labs reviewed and and overall he is adequate to continue treatment chemo today -He knows to call us if he gets any unsuspecting symptoms or significantly worsens.  -f/u in 10/23, he has a mild neuropathy, I may consider hold or dose reduction for future oxaliplatin   2. 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 -Hemoglobin was 8.3 initially with low MCV, likely anemia of iron deficiency from chronic blood loss. This was confirmed by his iron study. 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 -previously repeated iron study still showed low iron, his anemia has gotten worse lately, I previously recommend him to take oral iron 1-2 tab a day for a month.  -He restarted oral iron.  -I suggested he watches his BM while on oral iron.  -his repeat iron level on 03/14/17 is 15 with saturation 5%, ferritin is normal, I gave one dose IV iron infusion, anemia has improved - his Hg is 10.1 (04/04/17) and improved to 11.5 on 05/23/17   3.  Fatigue -Likely the combination of anemia, chemotherapy and radiation  and surgery. -I encouraged him to be  well, and exercise  4. Physical therapy -Post surgery PT would like help with pelvic floor -continue PT   Plan -he has started cycle 3 Xeloda one week ago. Lab and flu shot on 10/8, if lab adequate, he will start cycle 4 Xeloda on October 9 -Lab reviewed, adequate for treatment, he will proceed to cycle 3 of oxaliplatin today -Lab, f/u and cycle 4 Oxaliplatin on 10/23   All questions were answered. The patient knows to call the clinic with any problems, questions or concerns.  I spent 25 minutes counseling the patient face to face. The total time spent in the appointment was 30 minutes and more than 50% was on counseling.  This document serves as a record of services personally performed by Truitt Merle, MD. It was created on her behalf by Joslyn Devon, a trained medical scribe. The creation of this record is based on the scribe's personal observations and the provider's statements to them. This document has been checked and approved by the attending provider.    Truitt Merle, MD 05/23/2017

## 2017-05-23 ENCOUNTER — Ambulatory Visit (HOSPITAL_BASED_OUTPATIENT_CLINIC_OR_DEPARTMENT_OTHER): Payer: BLUE CROSS/BLUE SHIELD

## 2017-05-23 ENCOUNTER — Other Ambulatory Visit (HOSPITAL_BASED_OUTPATIENT_CLINIC_OR_DEPARTMENT_OTHER): Payer: BLUE CROSS/BLUE SHIELD

## 2017-05-23 ENCOUNTER — Encounter: Payer: Self-pay | Admitting: Hematology

## 2017-05-23 ENCOUNTER — Ambulatory Visit (HOSPITAL_BASED_OUTPATIENT_CLINIC_OR_DEPARTMENT_OTHER): Payer: BLUE CROSS/BLUE SHIELD | Admitting: Hematology

## 2017-05-23 ENCOUNTER — Encounter: Payer: BLUE CROSS/BLUE SHIELD | Admitting: Physical Therapy

## 2017-05-23 VITALS — BP 127/88 | HR 69 | Temp 98.8°F | Resp 17 | Ht 72.0 in | Wt 145.7 lb

## 2017-05-23 DIAGNOSIS — C20 Malignant neoplasm of rectum: Secondary | ICD-10-CM

## 2017-05-23 DIAGNOSIS — Z5111 Encounter for antineoplastic chemotherapy: Secondary | ICD-10-CM

## 2017-05-23 DIAGNOSIS — R5383 Other fatigue: Secondary | ICD-10-CM | POA: Diagnosis not present

## 2017-05-23 DIAGNOSIS — D500 Iron deficiency anemia secondary to blood loss (chronic): Secondary | ICD-10-CM

## 2017-05-23 DIAGNOSIS — Z148 Genetic carrier of other disease: Secondary | ICD-10-CM

## 2017-05-23 DIAGNOSIS — A09 Infectious gastroenteritis and colitis, unspecified: Secondary | ICD-10-CM

## 2017-05-23 LAB — CBC WITH DIFFERENTIAL/PLATELET
BASO%: 1.2 % (ref 0.0–2.0)
Basophils Absolute: 0.1 10*3/uL (ref 0.0–0.1)
EOS%: 1.2 % (ref 0.0–7.0)
Eosinophils Absolute: 0.1 10*3/uL (ref 0.0–0.5)
HEMATOCRIT: 36.2 % — AB (ref 38.4–49.9)
HEMOGLOBIN: 11.5 g/dL — AB (ref 13.0–17.1)
LYMPH#: 0.8 10*3/uL — AB (ref 0.9–3.3)
LYMPH%: 18.5 % (ref 14.0–49.0)
MCH: 28.8 pg (ref 27.2–33.4)
MCHC: 31.8 g/dL — ABNORMAL LOW (ref 32.0–36.0)
MCV: 90.5 fL (ref 79.3–98.0)
MONO#: 0.4 10*3/uL (ref 0.1–0.9)
MONO%: 9.3 % (ref 0.0–14.0)
NEUT%: 69.8 % (ref 39.0–75.0)
NEUTROS ABS: 3 10*3/uL (ref 1.5–6.5)
Platelets: 191 10*3/uL (ref 140–400)
RBC: 4 10*6/uL — ABNORMAL LOW (ref 4.20–5.82)
RDW: 25.9 % — AB (ref 11.0–14.6)
WBC: 4.3 10*3/uL (ref 4.0–10.3)

## 2017-05-23 LAB — CEA (IN HOUSE-CHCC): CEA (CHCC-In House): 8.79 ng/mL — ABNORMAL HIGH (ref 0.00–5.00)

## 2017-05-23 LAB — COMPREHENSIVE METABOLIC PANEL
ALBUMIN: 3.5 g/dL (ref 3.5–5.0)
ALT: 51 U/L (ref 0–55)
AST: 39 U/L — AB (ref 5–34)
Alkaline Phosphatase: 146 U/L (ref 40–150)
Anion Gap: 11 mEq/L (ref 3–11)
BUN: 10.8 mg/dL (ref 7.0–26.0)
CALCIUM: 9.2 mg/dL (ref 8.4–10.4)
CHLORIDE: 104 meq/L (ref 98–109)
CO2: 23 mEq/L (ref 22–29)
CREATININE: 0.8 mg/dL (ref 0.7–1.3)
EGFR: >90 mL/min/{1.73_m2} (ref >90)
Glucose: 131 mg/dl (ref 70–140)
Potassium: 3.8 mEq/L (ref 3.5–5.1)
Sodium: 139 mEq/L (ref 136–145)
TOTAL PROTEIN: 7.5 g/dL (ref 6.4–8.3)
Total Bilirubin: 0.61 mg/dL (ref 0.20–1.20)

## 2017-05-23 LAB — IRON AND TIBC
%SAT: 20 % (ref 20–55)
Iron: 79 ug/dL (ref 42–163)
TIBC: 405 ug/dL (ref 202–409)
UIBC: 326 ug/dL (ref 117–376)

## 2017-05-23 LAB — FERRITIN: FERRITIN: 82 ng/mL (ref 22–316)

## 2017-05-23 MED ORDER — DEXAMETHASONE SODIUM PHOSPHATE 10 MG/ML IJ SOLN
INTRAMUSCULAR | Status: AC
  Filled 2017-05-23: qty 1

## 2017-05-23 MED ORDER — OXALIPLATIN CHEMO INJECTION 100 MG/20ML
130.0000 mg/m2 | Freq: Once | INTRAVENOUS | Status: AC
  Administered 2017-05-23: 235 mg via INTRAVENOUS
  Filled 2017-05-23: qty 7

## 2017-05-23 MED ORDER — DEXAMETHASONE SODIUM PHOSPHATE 10 MG/ML IJ SOLN
10.0000 mg | Freq: Once | INTRAMUSCULAR | Status: AC
  Administered 2017-05-23: 10 mg via INTRAVENOUS

## 2017-05-23 MED ORDER — PALONOSETRON HCL INJECTION 0.25 MG/5ML
INTRAVENOUS | Status: AC
  Filled 2017-05-23: qty 5

## 2017-05-23 MED ORDER — DEXTROSE 5 % IV SOLN
Freq: Once | INTRAVENOUS | Status: AC
  Administered 2017-05-23: 10:00:00 via INTRAVENOUS

## 2017-05-23 MED ORDER — PALONOSETRON HCL INJECTION 0.25 MG/5ML
0.2500 mg | Freq: Once | INTRAVENOUS | Status: AC
  Administered 2017-05-23: 0.25 mg via INTRAVENOUS

## 2017-05-23 NOTE — Patient Instructions (Signed)
[facility] Discharge Instructions for Patients Receiving Chemotherapy  Today you received the following chemotherapy agents: Oxaliplatin  To help prevent nausea and vomiting after your treatment, we encourage you to take your nausea medication as directed.   If you develop nausea and vomiting that is not controlled by your nausea medication, call the clinic.   BELOW ARE SYMPTOMS THAT SHOULD BE REPORTED IMMEDIATELY:  *FEVER GREATER THAN 100.5 F  *CHILLS WITH OR WITHOUT FEVER  NAUSEA AND VOMITING THAT IS NOT CONTROLLED WITH YOUR NAUSEA MEDICATION  *UNUSUAL SHORTNESS OF BREATH  *UNUSUAL BRUISING OR BLEEDING  TENDERNESS IN MOUTH AND THROAT WITH OR WITHOUT PRESENCE OF ULCERS  *URINARY PROBLEMS  *BOWEL PROBLEMS  UNUSUAL RASH Items with * indicate a potential emergency and should be followed up as soon as possible.  Feel free to call the clinic should you have any questions or concerns. The clinic phone number is [phone].  Please show the CHEMO ALERT CARD at check-in to the Emergency Department and triage nurse.   

## 2017-05-28 ENCOUNTER — Telehealth: Payer: Self-pay | Admitting: Hematology

## 2017-05-28 NOTE — Telephone Encounter (Signed)
Scheduled appt per 9/26  los - left message with appt date and time and sent reminder letter in the mail.

## 2017-05-29 ENCOUNTER — Encounter: Payer: Self-pay | Admitting: Hematology

## 2017-05-30 NOTE — Telephone Encounter (Signed)
Called pt today & discussed elevated CEA.  He wants to know if Dr Burr Medico is concerned about this.  He has been doing some reading on the computer.  Informed that Dr Burr Medico has not expressed concern over this with me but we should just wait to see what the next CEA is & if there is a trend of increasing elevation then she would be concerned.  Discussed with Dr Burr Medico & she agreed.  Informed pt of this & reported that if CEA continues to elevate, Dr Burr Medico will move scans to earlier. Pt expressed understanding.

## 2017-06-04 ENCOUNTER — Other Ambulatory Visit (HOSPITAL_BASED_OUTPATIENT_CLINIC_OR_DEPARTMENT_OTHER): Payer: BLUE CROSS/BLUE SHIELD

## 2017-06-04 ENCOUNTER — Ambulatory Visit (HOSPITAL_BASED_OUTPATIENT_CLINIC_OR_DEPARTMENT_OTHER): Payer: BLUE CROSS/BLUE SHIELD

## 2017-06-04 ENCOUNTER — Other Ambulatory Visit: Payer: BLUE CROSS/BLUE SHIELD

## 2017-06-04 ENCOUNTER — Telehealth: Payer: Self-pay | Admitting: *Deleted

## 2017-06-04 VITALS — BP 119/72 | HR 70 | Temp 97.6°F | Resp 18

## 2017-06-04 DIAGNOSIS — C20 Malignant neoplasm of rectum: Secondary | ICD-10-CM | POA: Diagnosis not present

## 2017-06-04 DIAGNOSIS — Z23 Encounter for immunization: Secondary | ICD-10-CM

## 2017-06-04 LAB — CBC WITH DIFFERENTIAL/PLATELET
BASO%: 0.4 % (ref 0.0–2.0)
Basophils Absolute: 0 10*3/uL (ref 0.0–0.1)
EOS ABS: 0 10*3/uL (ref 0.0–0.5)
EOS%: 0.9 % (ref 0.0–7.0)
HCT: 33.6 % — ABNORMAL LOW (ref 38.4–49.9)
HEMOGLOBIN: 11.1 g/dL — AB (ref 13.0–17.1)
LYMPH%: 12.2 % — ABNORMAL LOW (ref 14.0–49.0)
MCH: 30.2 pg (ref 27.2–33.4)
MCHC: 33.2 g/dL (ref 32.0–36.0)
MCV: 91 fL (ref 79.3–98.0)
MONO#: 0.5 10*3/uL (ref 0.1–0.9)
MONO%: 10.8 % (ref 0.0–14.0)
NEUT%: 75.7 % — ABNORMAL HIGH (ref 39.0–75.0)
NEUTROS ABS: 3.7 10*3/uL (ref 1.5–6.5)
PLATELETS: 260 10*3/uL (ref 140–400)
RBC: 3.69 10*6/uL — ABNORMAL LOW (ref 4.20–5.82)
RDW: 32 % — AB (ref 11.0–14.6)
WBC: 4.9 10*3/uL (ref 4.0–10.3)
lymph#: 0.6 10*3/uL — ABNORMAL LOW (ref 0.9–3.3)

## 2017-06-04 LAB — COMPREHENSIVE METABOLIC PANEL
ALBUMIN: 3.4 g/dL — AB (ref 3.5–5.0)
ALK PHOS: 135 U/L (ref 40–150)
ALT: 43 U/L (ref 0–55)
AST: 50 U/L — AB (ref 5–34)
Anion Gap: 9 mEq/L (ref 3–11)
BILIRUBIN TOTAL: 0.59 mg/dL (ref 0.20–1.20)
BUN: 14.6 mg/dL (ref 7.0–26.0)
CO2: 22 mEq/L (ref 22–29)
CREATININE: 0.8 mg/dL (ref 0.7–1.3)
Calcium: 8.9 mg/dL (ref 8.4–10.4)
Chloride: 108 mEq/L (ref 98–109)
EGFR: >90 mL/min/{1.73_m2} (ref >90)
GLUCOSE: 118 mg/dL (ref 70–140)
POTASSIUM: 3.5 meq/L (ref 3.5–5.1)
SODIUM: 139 meq/L (ref 136–145)
TOTAL PROTEIN: 7.1 g/dL (ref 6.4–8.3)

## 2017-06-04 MED ORDER — INFLUENZA VAC SPLIT QUAD 0.5 ML IM SUSY
0.5000 mL | PREFILLED_SYRINGE | Freq: Once | INTRAMUSCULAR | Status: AC
  Administered 2017-06-04: 0.5 mL via INTRAMUSCULAR
  Filled 2017-06-04: qty 0.5

## 2017-06-04 NOTE — Telephone Encounter (Signed)
Spoke with pt and was informed that pt is due to restart Xeloda this Wed 06/06/17 for 14 days on, 7 days off.  However, Dr. Burr Medico has agreed to postpone Oxaliplatin infusion until  06/19/17.  Pt would like to know when pt should resume Xeloda. Pt's    Phone    [phone].

## 2017-06-05 NOTE — Telephone Encounter (Signed)
Called pt and left message on voice mail requesting a call back to nurse for Dr. Ernestina Penna instructions.

## 2017-06-05 NOTE — Telephone Encounter (Signed)
S/w pt per Dr Ernestina Penna instructions. He stated they make sense.

## 2017-06-05 NOTE — Telephone Encounter (Signed)
OK to start next cycle Xeloda on 10/10, but only take for one week, then off for one week. Plan to restart Xeloda and oxaliplatin together on 10/23. Thanks   Truitt Merle MD

## 2017-06-06 ENCOUNTER — Ambulatory Visit: Payer: BLUE CROSS/BLUE SHIELD | Admitting: Hematology

## 2017-06-06 ENCOUNTER — Other Ambulatory Visit: Payer: BLUE CROSS/BLUE SHIELD

## 2017-06-06 ENCOUNTER — Ambulatory Visit: Payer: BLUE CROSS/BLUE SHIELD

## 2017-06-13 ENCOUNTER — Ambulatory Visit: Payer: BLUE CROSS/BLUE SHIELD | Admitting: Nurse Practitioner

## 2017-06-13 ENCOUNTER — Ambulatory Visit: Payer: BLUE CROSS/BLUE SHIELD

## 2017-06-13 ENCOUNTER — Other Ambulatory Visit: Payer: BLUE CROSS/BLUE SHIELD

## 2017-06-18 NOTE — Progress Notes (Addendum)
[place]  Telephone:([phone]) [phone] Fax:([phone]) [phone]  Clinic Follow Up Note   Patient Care Team: Robyn Haber, MD as PCP - General (Family Medicine) Michael Boston, MD as Consulting Physician (General Surgery) Pyrtle, Lajuan Lines, MD as Consulting Physician (Gastroenterology) Truitt Merle, MD as Consulting Physician (Hematology and Oncology) Kyung Rudd, MD as Consulting Physician (Radiation Oncology) 06/19/2017  CHIEF COMPLAINTS:  Follow up rectal adenocarcinoma  Oncology History   Cancer Staging Rectal adenocarcinoma  Staging form: Colon and Rectum, AJCC 8th Edition - Clinical stage from 10/09/2016: Stage IIIB (cT3, cN1b, cM0) - Signed by Truitt Merle, MD on 10/25/2016       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   10/03/2016 - 10/03/2016 Hospital Admission    Admit date: 10/03/16 The patient presented to the [facility] ED with rectal bleeding and difficulty with hygiene.      10/09/2016 Initial Diagnosis    Rectal adenocarcinoma       10/09/2016 Procedure    Colonoscopy showed a fungating, infiltrative and ulcerated partially obstructing large mass in the distal rectum, measuring 9 cm, prolapsing at anal canal. Scope not advanced proximal to the rectosigmoid colon.      10/09/2016 Initial Biopsy    Rectal mass biopsy showed invasive adenocarcinoma.      10/13/2016 Imaging    CT Chest Abdomen Pelvis w contrast IMPRESSION: Large irregular enhancing rectal mass consistent with known rectal cancer. There are small perirectal and small pelvic sidewall lymph nodes which are suspicious. Prominent perirectal vessels are also noted. No evidence of metastatic retroperitoneal or sigmoid mesocolon adenopathy or hepatic or pulmonary metastasis.      10/24/2016 Imaging    MRI Pelvis IMPRESSION: 1. Moderately motion degraded exam. 2. Large mass is centered at the anus with extension into the low rectum. No complicating obstruction. 3. Mild mesorectal  adenopathy, suspicious. 4. Right inguinal hernia containing nonobstructive small bowel.       11/08/2016 - 12/19/2016 Chemotherapy    Xeloda 50 mg twice daily, with concurrent radiation, held since 12/03/2016 due to colitis and hospitalization      11/08/2016 - 12/19/2016 Radiation Therapy    Neoadjuvant radiation to his rectal cancer  1) Pelvis/ 45 Gy in 25 fractions  2) Rectum boost/ 9 Gy in 5 fractions Under the care of Dr. Lisbeth Renshaw.      12/03/2016 - 12/09/2016 Hospital Admission    He presented with few days to one week history of diarrhea with 6-7 loose stools daily, along with a fever of 101.5 on the day of admission. Patient was also noted to be hypokalemic, hyponatremic. Hospitalized for further management. Stool studies were sent. C. difficile was negative. GI pathogen panel was positive for Yersinia.  XELODA was held at this time, until infection has cleared.       12/07/2016 Imaging    CT A/P W Contrast  IMPRESSION: 1. Abnormal dilatation of the proximal small bowel loops worrisome for either small bowel obstruction versus small bowel ileus. 2. 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 3. Large right inguinal hernia containing edematous loop of distal small bowel 4. No pneumatosis or bowel perforation.  No abscess. 5. Aortic atherosclerosis 6. Mild decrease in size of rectal mass. No change in right common iliac adenopathy.      02/21/2017 Surgery    XI ROBOT ABDOMINOPERINEAL RESECTION WITH COLOSTOMY by Dr. Johney Maine      02/21/2017 Pathology Results  Diagnosis 02/21/17 1. Colon, segmental resection for tumor, rectosigmoid - INVASIVE ADENOCARCINOMA, MODERATELY DIFFERENTIATED, SPANNING 3.9 CM. - TUMOR INVADES THROUGH MUSCULARIS PROPRIA. - RESECTION MARGINS ARE NEGATIVE. - EIGHT OF EIGHT LYMPH NODES NEGATIVE FOR CARCINOMA (0/8), SEE COMMENT. - SEE ONCOLOGY TABLE. 2. Soft  tissue, biopsy, left lateral pelvic floor - BENIGN SKELETAL MUSCLE AND SOFT TISSUE. - NO MALIGNANCY IDENTIFIED. 3. Colon, segmental resection, proximal sigmoid - BENIGN COLONIC MUCOSA. - NO DYSPLASIA OR MALIGNANCY. 4. Liver, biopsy, liver mass - BENIGN VASCULAR PROLIFERATION CONSISTENT WITH HEMANGIOMA. - NO MALIGNANCY IDENTIFIED.      04/04/2017 -  Chemotherapy    CAPOX every 3 weeks for 6 cycles      HISTORY OF PRESENTING ILLNESS (10/25/16):  David Clarke 61 y.o. male is here because of his recently diagnosed rectal adenocarcinoma. He was referred by his gastroenterologist title parietal. Patient presents to my clinic, accompanied by his wife and his daughter.       He has noticed a anal mass accompanied by intermittent bleeding and pain in the rectum for the past 5 months. The patient initially presented to the [facility] ED on 10/03/16 with rectal bleeding and difficulty with hygeine. He was without health insurance previously and so did not pursue evaluation or treatment until this time.  The patient was seen by Dr. Hilarie Fredrickson for a colonoscopy and biopsy on 10/09/16, revealing a large partially obstructing low rectal mass, prolapsing to anal verge. Biopsy showed adenocarcinoma. CT C/A/P on 10/13/16 revealed a large, irregular, enhancing mass consistent with known rectal cancer. There were several suspicious small perirectal and small pelvic sidewall lymph nodes. Prominent perirectal vessels were also noted. MRI of the pelvis on 10/24/16 showed a large mass centered at the anus with extension into the low rectum without obstruction. There was suspicious mild mesorectal adenopathy, and a right inguinal hernia containing nonobstructive small bowel.   The patient comes to the clinic for consultation today accompanied by his partner and daughter. He denies abdominal cramps, nausea, bowel or bladder concerns. He reports taking OTC stool softener as needed. He reports his appetite is good, but he has lost  12-15 lbs in the past few weeks. His daughter notes she has hemochromatosis and questions if this could be playing a role in the patient's anemia.  CURRENT THERAPY:  Adjuvant CAPOX every 3 weeks for 6 cycles, started 04/04/17   INTERIM HISTORY: David Clarke presents for follow up and cycle 4 of Oxaliplatin. Last oxaliplatin on 05/23/17; he took xeloda 1 week on from 06/06/17 - 06/12/17 with 1 week off per Dr. Ernestina Penna instruction. He tolerated last cycle well overall. Cold sensitivity after oxali lasts longer, about 3 weeks total but very minimal towards the end. Peripheral neuropathy is stable overall, numbness and tingling increases with oxaliplatin cold sensitivity then decreases in severity. He remains able to function and work normally. Hands and feet slightly red, not painful, no skin breakdown. Mild bilateral calf cramping few days after oxaliplatin. Clear rhinorrhea, increased after eating spicy foods. Denies nasal congestion, sore throat, fever, or chills. BM per colostomy every 2-3 days, denies diarrhea or nausea.    MEDICAL HISTORY:  Past Medical History:  Diagnosis Date  . Allergy   . Cancer ([place]) 10/09/2016   rectal adenocarcinoma  . History of hiatal hernia     SURGICAL HISTORY: Past Surgical History:  Procedure Laterality Date  . COLONOSCOPY W/ BIOPSIES Bilateral 10/09/2016   rectal adenocarcinoma  . MOUTH SURGERY     age 89, to remove extra "  eye" teeth  . [place] teeth extraction    . XI ROBOT ABDOMINAL PERINEAL RESECTION N/A 02/21/2017   Procedure: XI ROBOT ABDOMINOPERINEAL RESECTION WITH COLOSTOMY;  Surgeon: Michael Boston, MD;  Location: [facility];  Service: General;  Laterality: N/A;    SOCIAL HISTORY: Social History   Social History  . Marital status: Single    Spouse name: N/A  . Number of children: 1  . Years of education: N/A   Occupational History  . restaurant/caterer    Social History Main Topics  . Smoking status: Former Smoker    Years: 2.00    Types:  Cigarettes    Quit date: 2002  . Smokeless tobacco: Never Used  . Alcohol use 6.0 oz/week    4 Glasses of wine, 6 Cans of beer per week     Comment: beer or a glass or wine most days of the week  . Drug use: No  . Sexual activity: Yes    Partners: Female   Other Topics Concern  . Not on file   Social History Narrative  . No narrative on file    FAMILY HISTORY: Family History  Problem Relation Age of Onset  . Other Mother        bile duct tumor  . Stroke Mother   . Cancer Mother 28       bile duct cancer   . Prostate cancer Father 75  . Emphysema Father   . Colon cancer Neg Hx   . Esophageal cancer Neg Hx   . Pancreatic cancer Neg Hx   . Rectal cancer Neg Hx   . Stomach cancer Neg Hx     ALLERGIES:  is allergic to sulfa antibiotics.  MEDICATIONS:  Current Outpatient Prescriptions  Medication Sig Dispense Refill  . capecitabine (XELODA) 500 MG tablet Take 4 tab in the morning and 3 tab in the evening, on days 1-14 of chemotherapy every 21 days. 98 tablet 1  . Multiple Vitamin (MULTIVITAMIN WITH MINERALS) TABS tablet Take 1 tablet by mouth 3 (three) times a week.    Marland Kitchen acetaminophen (TYLENOL) 500 MG tablet Take 1,000 mg by mouth 3 (three) times daily.    . ondansetron (ZOFRAN) 8 MG tablet Take 1 tablet (8 mg total) by mouth 2 (two) times daily as needed for refractory nausea / vomiting. Start on day 3 after chemotherapy. (Patient not taking: Reported on 04/04/2017) 30 tablet 1  . Oral Wound Care Products [facility]) LIQD Use as directed 5 mLs in the mouth or throat every 4 (four) hours as needed. (Patient not taking: Reported on 02/15/2017) 240 mL 1  . prochlorperazine (COMPAZINE) 10 MG tablet Take 1 tablet (10 mg total) by mouth every 6 (six) hours as needed (Nausea or vomiting). 30 tablet 1   Current Facility-Administered Medications  Medication Dose Route Frequency Provider Last Rate Last Dose  . 0.9 %  sodium chloride infusion  500 mL Intravenous Continuous Pyrtle, Lajuan Lines, MD         REVIEW OF SYSTEMS:   Constitutional: Denies fatigue, fevers, chills or abnormal night sweats (+)  weight gain  (+) decreased appetite  Eyes: Denies blurriness of vision, double vision or watery eyes Ears, nose, mouth, throat, and face: Denies mucositis or sore throat (+) clear rhinorrhea and post nasal drip Respiratory: Denies cough, dyspnea or wheezes Cardiovascular: Denies palpitation, chest discomfort or lower extremity swelling Gastrointestinal:  Denies nausea, heartburn or change in bowel habits (+) colostomy bag, BM every 2-3 days Skin: (+) palms  and feet slightly pink and darker from Xeloda Lymphatics: Denies new lymphadenopathy or easy bruising MSK: denies joint pain or swelling  Neurological: Denies new weaknesses (+)  slightly worse neuropathy to hands after oxaliplatin infusions but stable overall; sensitivity with xeloda  Behavioral/Psych: Mood is stable, no new changes  All other systems were reviewed with the patient and are negative.   PHYSICAL EXAMINATION: ECOG PERFORMANCE STATUS: 1 - Symptomatic but completely ambulatory  Vitals:   06/19/17 0842  BP: 126/77  Pulse: 61  Resp: 20  Temp: 97.9 F (36.6 C)  SpO2: 100%   Filed Weights   06/19/17 0842  Weight: 149 lb 12.8 oz (67.9 kg)     GENERAL: alert, no distress and comfortable.  HEENT: oropharynx without ulcers or exudate, slight erythema; no thrush.   SKIN: skin color, texture, turgor are normal, no rashes or significant lesions (+) mild palmar -plantar erythema and hyperpigmentation; no blisters, cracks, peeling, or breakdown NECK: supple, thyroid normal size, non-tender, without nodularity LYMPH: no palpable cervical or supraclavicular lymphadenopathy  LUNGS: clear to auscultation bilaterally with normal breathing effort HEART: regular rate & rhythm, no murmurs and no lower extremity edema ABDOMEN: abdomen soft, non-tender and normal bowel sounds (+) colostomy LUQ (+) laparoscopic incisions all well  healed Musculoskeletal: no cyanosis of digits and no clubbing  PSYCH: alert & oriented x 3 with fluent speech NEURO: no focal motor/sensory deficits. No decreased vibratory sense to hands per tuning fork exam RECTAL:   (+) reconstruction has healed without discharge or skin redness.    LABORATORY DATA:  I have reviewed the data as listed CBC Latest Ref Rng & Units 06/19/2017 06/04/2017 05/23/2017  WBC 4.0 - 10.3 10e3/uL 4.3 4.9 4.3  Hemoglobin 13.0 - 17.1 g/dL 12.1(L) 11.1(L) 11.5(L)  Hematocrit 38.4 - 49.9 % 38.1(L) 33.6(L) 36.2(L)  Platelets 140 - 400 10e3/uL 215 260 191   CMP Latest Ref Rng & Units 06/19/2017 06/04/2017 05/23/2017  Glucose 70 - 140 mg/dl 101 118 131  BUN 7.0 - 26.0 mg/dL 12.1 14.6 10.8  Creatinine 0.7 - 1.3 mg/dL 0.8 0.8 0.8  Sodium 136 - 145 mEq/L 138 139 139  Potassium 3.5 - 5.1 mEq/L 4.0 3.5 3.8  Chloride 101 - 111 mmol/L - - -  CO2 22 - 29 mEq/L _0 Calcium 8.4 - 10.4 mg/dL 9.3 8.9 9.2  Total Protein 6.4 - 8.3 g/dL 7.4 7.1 7.5  Total Bilirubin 0.20 - 1.20 mg/dL 0.69 0.59 0.61  Alkaline Phos 40 - 150 U/L 117 135 146  AST 5 - 34 U/L 31 50(H) 39(H)  ALT 0 - 55 U/L 24 43 51     CEA ([facility]) 10/27/2016: 7.81 11/27/2016: 5.56 01/10/2017: 5.61 03/14/17: 5.11 04/11/17: 5.87 05/23/17: 8.79 06/19/17: 9.15   PATHOLOGY:   Diagnosis 02/21/17 1. Colon, segmental resection for tumor, rectosigmoid - INVASIVE ADENOCARCINOMA, MODERATELY DIFFERENTIATED, SPANNING 3.9 CM. - TUMOR INVADES THROUGH MUSCULARIS PROPRIA. - RESECTION MARGINS ARE NEGATIVE. - EIGHT OF EIGHT LYMPH NODES NEGATIVE FOR CARCINOMA (0/8), SEE COMMENT. - SEE ONCOLOGY TABLE. 2. Soft tissue, biopsy, left lateral pelvic floor - BENIGN SKELETAL MUSCLE AND SOFT TISSUE. - NO MALIGNANCY IDENTIFIED. 3. Colon, segmental resection, proximal sigmoid - BENIGN COLONIC MUCOSA. - NO DYSPLASIA OR MALIGNANCY. 4. Liver, biopsy, liver mass - BENIGN VASCULAR PROLIFERATION CONSISTENT WITH HEMANGIOMA. - NO  MALIGNANCY IDENTIFIED. Microscopic Comment 1. COLON AND RECTUM (INCLUDING TRANS-ANAL RESECTION): Specimen: Rectosigmoid colon with anus. Procedure: Abdominoperineal resection. Tumor site: Distal 1/3 rectum to anus. Specimen integrity: Intact.  Macroscopic intactness of mesorectum: Incomplete. Macroscopic tumor perforation: Not identified. Invasive tumor: Maximum size: 3.9 cm Histologic type(s): Invasive adenocarcinoma. Histologic grade and differentiation: G2: moderately differentiated/low grade Type of polyp in which invasive carcinoma arose: N/A. Microscopic extension of invasive tumor: Tumor invades through muscularis propria. Lymph-Vascular invasion: Not identified. 1 of 3 FINAL for CASH, DUCE [phone]) Microscopic Comment(continued) Peri-neural invasion: Present. Tumor deposit(s) (discontinuous extramural extension): Not identified. Resection margins: Proximal margin: Negative. Distal margin: Negative. Circumferential (radial) (posterior ascending, posterior descending; lateral and posterior mid-rectum; and entire lower 1/3 rectum): Negative. Mesenteric margin (sigmoid and transverse): Negative. Distance closest margin (if all above margins negative): <0.1 cm radial margin. >1 cm distal margin. Treatment effect (neo-adjuvant therapy): Present Additional polyp(s): N/A. Non-neoplastic findings: None. Lymph nodes: number examined 8; number positive: 0 (treatment effect present). Pathologic Staging: ypT3, ypN0, ypMX Ancillary studies: Can be performed upon request. Comment: The fat was cleared and additional lymph nodes were not identified.  Diagnosis 10/09/2016 Rectum, biopsy, mass - ADENOCARCINOMA.  RADIOGRAPHIC STUDIES: I have personally reviewed the radiological images as listed and agreed with the findings in the report.  MRI Pelvis W WO Contrast 01/29/17 IMPRESSION: Significant decrease in size of soft tissue mass at the anorectal junction, with post radiation  changes. No residual perirectal lymphadenopathy or other signs of pelvic metastatic disease. Stable small right inguinal hernia containing small bowel.  CT A/P W Contrast 12/07/16 IMPRESSION: 1. Abnormal dilatation of the proximal small bowel loops worrisome for either small bowel obstruction versus small bowel ileus. 2. 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 3. Large right inguinal hernia containing edematous loop of distal small bowel 4. No pneumatosis or bowel perforation.  No abscess. 5. Aortic atherosclerosis 6. Mild decrease in size of rectal mass. No change in right common iliac adenopathy.  DG Chest 2 View 12/03/2016 IMPRESSION: 1. No active cardiopulmonary disease. No evidence of pneumonia or pulmonary edema. No evidence of intrathoracic metastasis. 2. Hyperexpanded lungs suggesting COPD.  MRI Pelvis 10/24/2016 IMPRESSION: 1. Moderately motion degraded exam. 2. Large mass is centered at the anus with extension into the low rectum. No complicating obstruction. 3. Mild mesorectal adenopathy, suspicious. 4. Right inguinal hernia containing nonobstructive small bowel.  CT CAP w Contrast 10/13/2016 IMPRESSION: Large irregular enhancing rectal mass consistent with known rectal cancer. There are small perirectal and small pelvic sidewall lymph nodes which are suspicious. Prominent perirectal vessels are also noted. No evidence of metastatic retroperitoneal or sigmoid mesocolon adenopathy or hepatic or pulmonary metastasis.  COLONOSOCPY 10/09/2016 Dr. Hilarie Fredrickson  A fungating, infiltrative and ulcerated partially obstructing large mass was found in the distal rectum. The mass was circumferential. The mass measured seven cm in length (prolapsing and from dentate line to approx 7 cm). In addition, its inner diameter measured nine mm. Oozing was present. This was biopsied with a cold  forceps for histology. - Due to the distal rectal mass the bowel preparation was poor and unable to be cleared via the adult upper endoscope (adult upper endoscope used due to obstructing mass). Scope not advanced proximal to the rectosigmoid colon.  ASSESSMENT & PLAN: 61 y.o.  gentleman, previously healthy, presented with a anal mass and a mild intermittent rectal bleeding for 5-6 months. Fatigue, anorexia, fatigue and weight loss for a month. He is currently on radiation for his rectal cancer, chemotherapy has been held lately.  1. Rectal Adenocarcinoma, low rectum, cT3N1bM0, ypT3N0, stage IIIB, MSI-pending  -  CT, MRI, colonoscopy and biopsy findings have been previously reviewed in detail with pt and his family members -Based on his imaging findings, he has clinical stage III disease. We reviewed his image in our GI tumor Board, he also has a small liver lesion which is indeterminate, likely benign, and we will follow up with a repeated CT in about 3 months.  -patient received neoadjuvant chemotherapy and radiation with Xeloda, which was held for the last few weeks due to infectious colitis. -He underwent APR on 02/21/2017, surgical margins were negative, all lymph nodes were negative. We've reviewed his surgical pathology findings he did have significant treatment effect on his surgical sample.  - adjuvant FOLFOX or CAPOX were recommended for his stage IIIB disease. The patient opted CAPOX, every 3 weeks, for total 6 cycles.  -He recovered well from surgery, he started adjuvant CAPOX 04/04/17. Reviewed side effects of cold sensitivity and aches at that time.  -Oxaliplatin was dose reduced by 15% for first cycle, he tolerated well, increased to full dose from cycle 2. -due to his working schedule, he wanted to postpone his cycle 3 and 4 oxaliplatin for week, he still on Xeloda every 3 weeks. -His CEA has been mildly elevated, slowly trending up; 9.15 today, 8.79 3 weeks ago and 5.87 previously, plan  to repeat staging scan after adjuvant chemo  -Labs reviewed, stable. Will proceed with 4th Oxaliplatin infusion today; he will begin cycle 5 xeloda 2000 mg AM and 1500 mg PM days 1-14 of 21 day cycle. Refilled prescription today. -He knows to call us if he gets any unsuspecting symptoms or significantly worsens, especially peripheral neuropathy -we discussed if his neuropathy worsens, we may hold cycle 5 (last cycle) oxaliplatin; he will plan to complete 2 more cycles of xeloda (one week for cycle 7).  -return in 3 weeks for f/u and next cycle   2. 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 -Hemoglobin was 8.3 initially with low MCV, likely anemia of iron deficiency from chronic blood loss. This was confirmed by his iron study. 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 -previously repeated iron study still showed low iron, his anemia has gotten worse lately, previously recommend him to take oral iron 1-2 tab a day for a month.  -He had restarted oral iron.  -I again reviewed possible constipation while on oral iron suggested he watches his BM while on oral iron.  -his repeat iron level on 03/14/17 is 15 with saturation 5%, ferritin is normal, I gave one dose IV iron infusion, anemia has improved -on 06/19/17 the patient reports he has been off iron for a few months, saturation 11%, ferritin is normal. I recommended he restart oral iron today, take 1 tablet daily, we will recheck in 1 month - his Hg is improved to 12.1 today   3.  Fatigue -Likely the combination of anemia, chemotherapy and radiation and  surgery. -I encouraged him to be well, and exercise  4. Physical therapy -Post surgery PT would like help with pelvic floor -continue PT   5. Peripheral neuropathy, secondary to chemo  -mild numbness, no decreased sensation on exam -continue monitoring closely  Plan -Labs reviewed, stable and adequate for treatment today. Proceed with cycle 4 oxaliplatin and cycle 5 xeloda 4 tabs AM, 3 tabs PM for 2 weeks on, 1 week off -restart oral iron 1 tab daily x1 month until lab recheck -lab and f/u with next cycle in 3 weeks   All questions were answered. The patient knows to call the clinic with any problems, questions or concerns.  I spent 25 minutes counseling the patient face to face. The total time spent in the appointment was 30 minutes and more than 50% was on counseling.  Cira Rue, NP 06/19/2017   I have seen the patient, examined him. I agree with the assessment and and plan and have edited the notes.   Truitt Merle  06/19/2017

## 2017-06-19 ENCOUNTER — Other Ambulatory Visit (HOSPITAL_BASED_OUTPATIENT_CLINIC_OR_DEPARTMENT_OTHER): Payer: BLUE CROSS/BLUE SHIELD

## 2017-06-19 ENCOUNTER — Telehealth: Payer: Self-pay | Admitting: Pharmacist

## 2017-06-19 ENCOUNTER — Ambulatory Visit (HOSPITAL_BASED_OUTPATIENT_CLINIC_OR_DEPARTMENT_OTHER): Payer: BLUE CROSS/BLUE SHIELD | Admitting: Hematology

## 2017-06-19 ENCOUNTER — Telehealth: Payer: Self-pay | Admitting: Hematology

## 2017-06-19 ENCOUNTER — Encounter: Payer: Self-pay | Admitting: Hematology

## 2017-06-19 ENCOUNTER — Ambulatory Visit (HOSPITAL_BASED_OUTPATIENT_CLINIC_OR_DEPARTMENT_OTHER): Payer: BLUE CROSS/BLUE SHIELD

## 2017-06-19 VITALS — BP 126/77 | HR 61 | Temp 97.9°F | Resp 20 | Ht 72.0 in | Wt 149.8 lb

## 2017-06-19 DIAGNOSIS — G620 Drug-induced polyneuropathy: Secondary | ICD-10-CM

## 2017-06-19 DIAGNOSIS — T451X5A Adverse effect of antineoplastic and immunosuppressive drugs, initial encounter: Secondary | ICD-10-CM

## 2017-06-19 DIAGNOSIS — R5383 Other fatigue: Secondary | ICD-10-CM | POA: Diagnosis not present

## 2017-06-19 DIAGNOSIS — D500 Iron deficiency anemia secondary to blood loss (chronic): Secondary | ICD-10-CM | POA: Diagnosis not present

## 2017-06-19 DIAGNOSIS — C20 Malignant neoplasm of rectum: Secondary | ICD-10-CM

## 2017-06-19 DIAGNOSIS — Z148 Genetic carrier of other disease: Secondary | ICD-10-CM

## 2017-06-19 DIAGNOSIS — Z5111 Encounter for antineoplastic chemotherapy: Secondary | ICD-10-CM

## 2017-06-19 LAB — COMPREHENSIVE METABOLIC PANEL
ALBUMIN: 3.5 g/dL (ref 3.5–5.0)
ALK PHOS: 117 U/L (ref 40–150)
ALT: 24 U/L (ref 0–55)
ANION GAP: 9 meq/L (ref 3–11)
AST: 31 U/L (ref 5–34)
BUN: 12.1 mg/dL (ref 7.0–26.0)
CALCIUM: 9.3 mg/dL (ref 8.4–10.4)
CO2: 24 mEq/L (ref 22–29)
CREATININE: 0.8 mg/dL (ref 0.7–1.3)
Chloride: 105 mEq/L (ref 98–109)
Glucose: 101 mg/dl (ref 70–140)
Potassium: 4 mEq/L (ref 3.5–5.1)
Sodium: 138 mEq/L (ref 136–145)
TOTAL PROTEIN: 7.4 g/dL (ref 6.4–8.3)
Total Bilirubin: 0.69 mg/dL (ref 0.20–1.20)

## 2017-06-19 LAB — CBC WITH DIFFERENTIAL/PLATELET
BASO%: 0.7 % (ref 0.0–2.0)
Basophils Absolute: 0 10*3/uL (ref 0.0–0.1)
EOS%: 1.4 % (ref 0.0–7.0)
Eosinophils Absolute: 0.1 10*3/uL (ref 0.0–0.5)
HEMATOCRIT: 38.1 % — AB (ref 38.4–49.9)
HGB: 12.1 g/dL — ABNORMAL LOW (ref 13.0–17.1)
LYMPH%: 14.8 % (ref 14.0–49.0)
MCH: 30.8 pg (ref 27.2–33.4)
MCHC: 31.8 g/dL — ABNORMAL LOW (ref 32.0–36.0)
MCV: 96.9 fL (ref 79.3–98.0)
MONO#: 0.6 10*3/uL (ref 0.1–0.9)
MONO%: 14.3 % — ABNORMAL HIGH (ref 0.0–14.0)
NEUT%: 68.8 % (ref 39.0–75.0)
NEUTROS ABS: 2.9 10*3/uL (ref 1.5–6.5)
PLATELETS: 215 10*3/uL (ref 140–400)
RBC: 3.93 10*6/uL — ABNORMAL LOW (ref 4.20–5.82)
RDW: 27.6 % — ABNORMAL HIGH (ref 11.0–14.6)
WBC: 4.3 10*3/uL (ref 4.0–10.3)
lymph#: 0.6 10*3/uL — ABNORMAL LOW (ref 0.9–3.3)

## 2017-06-19 LAB — FERRITIN: FERRITIN: 48 ng/mL (ref 22–316)

## 2017-06-19 LAB — IRON AND TIBC
%SAT: 11 % — ABNORMAL LOW (ref 20–55)
IRON: 50 ug/dL (ref 42–163)
TIBC: 446 ug/dL — ABNORMAL HIGH (ref 202–409)
UIBC: 396 ug/dL — ABNORMAL HIGH (ref 117–376)

## 2017-06-19 LAB — CEA (IN HOUSE-CHCC): CEA (CHCC-In House): 9.15 ng/mL — ABNORMAL HIGH (ref 0.00–5.00)

## 2017-06-19 MED ORDER — CAPECITABINE 500 MG PO TABS: ORAL_TABLET | ORAL | 1 refills | Status: DC

## 2017-06-19 MED ORDER — PALONOSETRON HCL INJECTION 0.25 MG/5ML
INTRAVENOUS | Status: AC
  Filled 2017-06-19: qty 5

## 2017-06-19 MED ORDER — DEXTROSE 5 % IV SOLN
Freq: Once | INTRAVENOUS | Status: AC
  Administered 2017-06-19: 10:00:00 via INTRAVENOUS

## 2017-06-19 MED ORDER — DEXAMETHASONE SODIUM PHOSPHATE 10 MG/ML IJ SOLN
INTRAMUSCULAR | Status: AC
  Filled 2017-06-19: qty 1

## 2017-06-19 MED ORDER — OXALIPLATIN CHEMO INJECTION 100 MG/20ML
130.0000 mg/m2 | Freq: Once | INTRAVENOUS | Status: AC
  Administered 2017-06-19: 235 mg via INTRAVENOUS
  Filled 2017-06-19: qty 47

## 2017-06-19 MED ORDER — PALONOSETRON HCL INJECTION 0.25 MG/5ML
0.2500 mg | Freq: Once | INTRAVENOUS | Status: AC
  Administered 2017-06-19: 0.25 mg via INTRAVENOUS

## 2017-06-19 MED ORDER — DEXAMETHASONE SODIUM PHOSPHATE 10 MG/ML IJ SOLN
10.0000 mg | Freq: Once | INTRAMUSCULAR | Status: AC
  Administered 2017-06-19: 10 mg via INTRAVENOUS

## 2017-06-19 NOTE — Patient Instructions (Signed)
[facility] Discharge Instructions for Patients Receiving Chemotherapy  Today you received the following chemotherapy agents: Oxaliplatin  To help prevent nausea and vomiting after your treatment, we encourage you to take your nausea medication as directed.   If you develop nausea and vomiting that is not controlled by your nausea medication, call the clinic.   BELOW ARE SYMPTOMS THAT SHOULD BE REPORTED IMMEDIATELY:  *FEVER GREATER THAN 100.5 F  *CHILLS WITH OR WITHOUT FEVER  NAUSEA AND VOMITING THAT IS NOT CONTROLLED WITH YOUR NAUSEA MEDICATION  *UNUSUAL SHORTNESS OF BREATH  *UNUSUAL BRUISING OR BLEEDING  TENDERNESS IN MOUTH AND THROAT WITH OR WITHOUT PRESENCE OF ULCERS  *URINARY PROBLEMS  *BOWEL PROBLEMS  UNUSUAL RASH Items with * indicate a potential emergency and should be followed up as soon as possible.  Feel free to call the clinic should you have any questions or concerns. The clinic phone number is [phone].  Please show the CHEMO ALERT CARD at check-in to the Emergency Department and triage nurse.   

## 2017-06-19 NOTE — Telephone Encounter (Signed)
Oral Chemotherapy Pharmacist Encounter  Follow-Up Form  Spoke with patient today to follow up regarding patient's oral chemotherapy medication: Xeloda for the adjuvant treatment of colorectal cancer in conjunction with infusional oxaliplatin, planned duration 6-8 cycles  Original Start date of oral chemotherapy: 04/04/17  Pt is doing well today  Pt reports 0 tablets/doses of Xeloda 500mg  tablets, 4 tablets (2000mg ) by mouth in AM and 3 tabs (1500mg ) in PM, within 30 min of meals, on days 1-14 of every 21 day cycle missed in the last month.   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 Patient also experiences grade 1 hand-foot syndrome. Hands are mildly discolored, this is managed with moisturizing lotion. Patient is agreeable to continued treatment  Pertinent labs reviewed: OK for treatment.  Other Issues: patient with questions about a letter he received from [facility] and getting medications on synchronized schedule from retail pharmacy. Patient will call customer service number on [employer] card for clarification. Xeloda prescription sent today e-scribed to incorrect pharmacy. Prescription now resent to [facility] per insurance requirement.  Patient knows to call the office with questions or concerns. [facility] will continue to follow.  Thank you,  Johny Drilling, PharmD, BCPS, BCOP 06/19/2017 9:40 AM [facility] [phone]

## 2017-06-19 NOTE — Telephone Encounter (Signed)
Gave avs and calendar for November  °

## 2017-07-04 ENCOUNTER — Encounter: Payer: Self-pay | Admitting: Hematology

## 2017-07-05 ENCOUNTER — Telehealth: Payer: Self-pay | Admitting: *Deleted

## 2017-07-05 ENCOUNTER — Telehealth: Payer: Self-pay | Admitting: Hematology

## 2017-07-05 NOTE — Telephone Encounter (Signed)
Spoke with patient regarding changes in appt per 11/8 sch msg.

## 2017-07-05 NOTE — Telephone Encounter (Signed)
Pt called requesting appts for 11/12 to be postponed to a week later to  07/16/17 due to pt has lots to do at work.   Pt stated he took Xeloda for 14 days with last cycle; now on week off since  Tuesday  07/03/17. Informed pt that nurse will contact pt with further instructions from Dr. Burr Medico - as to when to restart Xeloda and for how many days.  Pt voiced understanding. Schedule message sent for rescheduled appts to  07/16/17 Pt's   Phone     [phone].

## 2017-07-05 NOTE — Telephone Encounter (Signed)
Called pt and left message on voice mail re:  Per Dr. Burr Medico,  [place] to stay off Xeloda until next office visit on  07/16/17.  Gave pt appts for  07/16/17.

## 2017-07-09 ENCOUNTER — Ambulatory Visit: Payer: BLUE CROSS/BLUE SHIELD | Admitting: Hematology

## 2017-07-09 ENCOUNTER — Ambulatory Visit: Payer: BLUE CROSS/BLUE SHIELD

## 2017-07-09 ENCOUNTER — Other Ambulatory Visit: Payer: BLUE CROSS/BLUE SHIELD

## 2017-07-13 NOTE — Progress Notes (Signed)
[place]  Telephone:([phone]) [phone] [phone]  Clinic Follow Up Note   Patient Care Team: Robyn Haber, MD as PCP - General (Family Medicine) Michael Boston, MD as Consulting Physician (General Surgery) Pyrtle, Lajuan Lines, MD as Consulting Physician (Gastroenterology) Truitt Merle, MD as Consulting Physician (Hematology and Oncology) Kyung Rudd, MD as Consulting Physician (Radiation Oncology) 07/16/2017  CHIEF COMPLAINTS:  Follow up rectal adenocarcinoma  Oncology History   Cancer Staging Rectal adenocarcinoma  Staging form: Colon and Rectum, AJCC 8th Edition - Clinical stage from 10/09/2016: Stage IIIB (cT3, cN1b, cM0) - Signed by Truitt Merle, MD on 10/25/2016       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      10/09/2016 Initial Diagnosis    Rectal adenocarcinoma       10/09/2016 Procedure    Colonoscopy showed a fungating, infiltrative and ulcerated partially obstructing large mass in the distal rectum, measuring 9 cm, prolapsing at anal canal. Scope not advanced proximal to the rectosigmoid colon.      10/09/2016 Initial Biopsy    Rectal mass biopsy showed invasive adenocarcinoma.      10/13/2016 Imaging    CT Chest Abdomen Pelvis w contrast IMPRESSION: Large irregular enhancing rectal mass consistent with known rectal cancer. There are small perirectal and small pelvic sidewall lymph nodes which are suspicious. Prominent perirectal vessels are also noted. No evidence of metastatic retroperitoneal or sigmoid mesocolon adenopathy or hepatic or pulmonary metastasis.      10/24/2016 Imaging    MRI Pelvis IMPRESSION: 1. Moderately motion degraded exam. 2. Large mass is centered at the anus with extension into the low rectum. No complicating obstruction. 3. Mild mesorectal  adenopathy, suspicious. 4. Right inguinal hernia containing nonobstructive small bowel.       11/08/2016 - 12/19/2016 Chemotherapy    Xeloda 50 mg twice daily, with concurrent radiation, held since 12/03/2016 due to colitis and hospitalization      11/08/2016 - 12/19/2016 Radiation Therapy    Neoadjuvant radiation to his rectal cancer  1) Pelvis/ 45 Gy in 25 fractions  2) Rectum boost/ 9 Gy in 5 fractions Under the care of Dr. Lisbeth Renshaw.      12/03/2016 - 12/09/2016 Hospital Admission    He presented with few days to one week history of diarrhea with 6-7 loose stools daily, along with a fever of 101.5 on the day of admission. Patient was also noted to be hypokalemic, hyponatremic. Hospitalized for further management. Stool studies were sent. C. difficile was negative. GI pathogen panel was positive for Yersinia.  XELODA was held at this time, until infection has cleared.       12/07/2016 Imaging    CT A/P W Contrast  IMPRESSION: 1. Abnormal dilatation of the proximal small bowel loops worrisome for either small bowel obstruction versus small bowel ileus. 2. 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 3. Large right inguinal hernia containing edematous loop of distal small bowel 4. No pneumatosis or bowel perforation.  No abscess. 5. Aortic atherosclerosis 6. Mild decrease in size of rectal mass. No change in right common iliac adenopathy.      02/21/2017 Surgery    XI ROBOT ABDOMINOPERINEAL RESECTION WITH COLOSTOMY by Dr. Johney Maine      02/21/2017 Pathology Results  Diagnosis 02/21/17 1. Colon, segmental resection for tumor, rectosigmoid - INVASIVE ADENOCARCINOMA, MODERATELY DIFFERENTIATED, SPANNING 3.9 CM. - TUMOR INVADES THROUGH MUSCULARIS PROPRIA. - RESECTION MARGINS ARE NEGATIVE. - EIGHT OF EIGHT LYMPH NODES NEGATIVE FOR CARCINOMA (0/8), SEE COMMENT. - SEE ONCOLOGY TABLE. 2. Soft  tissue, biopsy, left lateral pelvic floor - BENIGN SKELETAL MUSCLE AND SOFT TISSUE. - NO MALIGNANCY IDENTIFIED. 3. Colon, segmental resection, proximal sigmoid - BENIGN COLONIC MUCOSA. - NO DYSPLASIA OR MALIGNANCY. 4. Liver, biopsy, liver mass - BENIGN VASCULAR PROLIFERATION CONSISTENT WITH HEMANGIOMA. - NO MALIGNANCY IDENTIFIED.      04/04/2017 -  Chemotherapy    CAPOX every 3 weeks for 6 cycles      HISTORY OF PRESENTING ILLNESS (10/25/16):  David Clarke 61 y.o. male is here because of his recently diagnosed rectal adenocarcinoma. He was referred by his gastroenterologist title parietal. Patient presents to my clinic, accompanied by his wife and his daughter.       He has noticed a anal mass accompanied by intermittent bleeding and pain in the rectum for the past 5 months. The patient initially presented to the [facility] ED on 10/03/16 with rectal bleeding and difficulty with hygiene. He was without health insurance previously and so did not pursue evaluation or treatment until this time.  The patient was seen by Dr. Hilarie Fredrickson for a colonoscopy and biopsy on 10/09/16, revealing a large partially obstructing low rectal mass, prolapsing to anal verge. Biopsy showed adenocarcinoma. CT C/A/P on 10/13/16 revealed a large, irregular, enhancing mass consistent with known rectal cancer. There were several suspicious small perirectal and small pelvic sidewall lymph nodes. Prominent perirectal vessels were also noted. MRI of the pelvis on 10/24/16 showed a large mass centered at the anus with extension into the low rectum without obstruction. There was suspicious mild mesorectal adenopathy, and a right inguinal hernia containing nonobstructive small bowel.   The patient comes to the clinic for consultation today accompanied by his partner and daughter. He denies abdominal cramps, nausea, bowel or bladder concerns. He reports taking OTC stool softener as needed. He reports his appetite is good, but he has lost  12-15 lbs in the past few weeks. His daughter notes she has hemochromatosis and questions if this could be playing a role in the patient's anemia.  CURRENT THERAPY:  Adjuvant CAPOX every 3 weeks for 6 cycles, started 04/04/17   INTERIM HISTORY:  David Clarke presents for follow up. He presents to the clinic today with his wife.  He notes he started his current cycle today, 07/16/17.  He notes continued but occasional neuropathy in hands and his feet, he has swelling in hands and tightness. He had dark spots on his palms. His wrist joints are more sensitive but his hands are still functional. Being on his feet more he will have feet swelling, but will resolve when elevated. He had cold sensitivity in his hands for 17 days after last infusion. 4 days after infusion he was moving a large cooler and he felt trouble to exhale for 20 seconds. He felt it was due to cold air.    MEDICAL HISTORY:  Past Medical History:  Diagnosis Date  . Allergy   . Cancer ([place]) 10/09/2016   rectal adenocarcinoma  . History of hiatal hernia     SURGICAL HISTORY: Past Surgical History:  Procedure Laterality Date  . COLONOSCOPY W/ BIOPSIES Bilateral 10/09/2016   rectal adenocarcinoma  . MOUTH SURGERY     age 27, to remove extra "eye" teeth  .  [place] teeth extraction    . XI ROBOT ABDOMINOPERINEAL RESECTION WITH COLOSTOMY N/A 02/21/2017   Performed by Michael Boston, MD at [facility] ORS    SOCIAL HISTORY: Social History   Socioeconomic History  . Marital status: Single    Spouse name: Not on file  . Number of children: 1  . Years of education: Not on file  . Highest education level: Not on file  Social Needs  . Financial resource strain: Not on file  . Food insecurity - worry: Not on file  . Food insecurity - inability: Not on file  . Transportation needs - medical: Not on file  . Transportation needs - non-medical: Not on file  Occupational History  . Occupation: Architectural technologist  Tobacco Use  . Smoking  status: Former Smoker    Years: 2.00    Types: Cigarettes    Last attempt to quit: 2002    Years since quitting: 16.8  . Smokeless tobacco: Never Used  Substance and Sexual Activity  . Alcohol use: Yes    Alcohol/week: 6.0 oz    Types: 4 Glasses of wine, 6 Cans of beer per week    Comment: beer or a glass or wine most days of the week  . Drug use: No  . Sexual activity: Yes    Partners: Female  Other Topics Concern  . Not on file  Social History Narrative  . Not on file    FAMILY HISTORY: Family History  Problem Relation Age of Onset  . Other Mother        bile duct tumor  . Stroke Mother   . Cancer Mother 70       bile duct cancer   . Prostate cancer Father 29  . Emphysema Father   . Colon cancer Neg Hx   . Esophageal cancer Neg Hx   . Pancreatic cancer Neg Hx   . Rectal cancer Neg Hx   . Stomach cancer Neg Hx     ALLERGIES:  is allergic to sulfa antibiotics.  MEDICATIONS:  Current Outpatient Medications  Medication Sig Dispense Refill  . acetaminophen (TYLENOL) 500 MG tablet Take 1,000 mg by mouth 3 (three) times daily.    . capecitabine (XELODA) 500 MG tablet Take 4 tab in the morning and 3 tab in the evening, on days 1-14 of chemotherapy every 21 days. 98 tablet 1  . IRON, FERROUS SULFATE, PO Take 65 mg daily by mouth.    . Multiple Vitamin (MULTIVITAMIN WITH MINERALS) TABS tablet Take 1 tablet by mouth 3 (three) times a week.    . ondansetron (ZOFRAN) 8 MG tablet Take 1 tablet (8 mg total) by mouth 2 (two) times daily as needed for refractory nausea / vomiting. Start on day 3 after chemotherapy. 30 tablet 1  . prochlorperazine (COMPAZINE) 10 MG tablet Take 1 tablet (10 mg total) by mouth every 6 (six) hours as needed (Nausea or vomiting). 30 tablet 1   Current Facility-Administered Medications  Medication Dose Route Frequency Provider Last Rate Last Dose  . 0.9 %  sodium chloride infusion  500 mL Intravenous Continuous Pyrtle, Lajuan Lines, MD        REVIEW OF  SYSTEMS:   Constitutional: Denies fevers, chills or abnormal night sweats  Eyes: Denies blurriness of vision, double vision or watery eyes Ears, nose, mouth, throat, and face: Denies mucositis or sore throat   Respiratory: Denies cough, dyspnea or wheezes Cardiovascular: Denies palpitation, chest discomfort or lower extremity swelling Gastrointestinal:  Denies nausea, heartburn  or change in bowel habits (+) colostomy bag, BM every 2-3 days Skin: (+) skin tightness, discoloration on hands from Xeloda Lymphatics: Denies new lymphadenopathy or easy bruising MSK: (+) Hand and wrist joint sensitivity  Neurological: (+) increased neuropathy in hands and feet.  Behavioral/Psych: Mood is stable, no new changes  All other systems were reviewed with the patient and are negative.   PHYSICAL EXAMINATION: ECOG PERFORMANCE STATUS: 1 - Symptomatic but completely ambulatory  Vitals:   07/16/17 1414  BP: 130/75  Pulse: 71  Resp: 18  Temp: (!) 97.5 F (36.4 C)  SpO2: 97%   Filed Weights   07/16/17 1414  Weight: 151 lb 9.6 oz (68.8 kg)     GENERAL: alert, no distress and comfortable.  HEENT: (+) thrush resolved SKIN: skin color, texture, turgor are normal, no rashes or significant lesions NECK: supple, thyroid normal size, non-tender, without nodularity LYMPH: no palpable lymphadenopathy in the cervical, axillary or inguinal LUNGS: clear to auscultation and percussion with normal breathing effort HEART: regular rate & rhythm and no murmurs and no lower extremity edema ABDOMEN: abdomen soft, non-tender and normal bowel sounds (+) colostomy bag in left Upper Quadrant with pink liquid and no stool (+) laparoscopic incisions all healed Musculoskeletal: no cyanosis of digits and no clubbing  PSYCH: alert & oriented x 3 with fluent speech NEURO: no focal motor/sensory deficits.  RECTAL:  (+) reconstruction has healed without discharge or skin redness.    LABORATORY DATA:  I have reviewed the  data as listed CBC Latest Ref Rng & Units 07/16/2017 06/19/2017 06/04/2017  WBC 4.0 - 10.3 10e3/uL 4.1 4.3 4.9  Hemoglobin 13.0 - 17.1 g/dL 12.5(L) 12.1(L) 11.1(L)  Hematocrit 38.4 - 49.9 % 37.8(L) 38.1(L) 33.6(L)  Platelets 140 - 400 10e3/uL 160 215 260   CMP Latest Ref Rng & Units 07/16/2017 06/19/2017 06/04/2017  Glucose 70 - 140 mg/dl 133 101 118  BUN 7.0 - 26.0 mg/dL 12.0 12.1 14.6  Creatinine 0.7 - 1.3 mg/dL 0.8 0.8 0.8  Sodium 136 - 145 mEq/L 137 138 139  Potassium 3.5 - 5.1 mEq/L 3.6 4.0 3.5  Chloride 101 - 111 mmol/L - - -  CO2 22 - 29 mEq/L 22 24 22   Calcium 8.4 - 10.4 mg/dL 9.0 9.3 8.9  Total Protein 6.4 - 8.3 g/dL 7.5 7.4 7.1  Total Bilirubin 0.20 - 1.20 mg/dL 0.66 0.69 0.59  Alkaline Phos 40 - 150 U/L 141 117 135  AST 5 - 34 U/L 42(H) 31 50(H)  ALT 0 - 55 U/L 39 24 43     CEA ([facility]) 10/27/2016: 7.81 11/27/2016: 5.56 01/10/2017: 5.61 03/14/17: 5.11 04/11/17: 5.87 05/23/17: 8.79 06/19/17:  9/15 07/16/17: PENDING    PATHOLOGY:   Diagnosis 02/21/17 1. Colon, segmental resection for tumor, rectosigmoid - INVASIVE ADENOCARCINOMA, MODERATELY DIFFERENTIATED, SPANNING 3.9 CM. - TUMOR INVADES THROUGH MUSCULARIS PROPRIA. - RESECTION MARGINS ARE NEGATIVE. - EIGHT OF EIGHT LYMPH NODES NEGATIVE FOR CARCINOMA (0/8), SEE COMMENT. - SEE ONCOLOGY TABLE. 2. Soft tissue, biopsy, left lateral pelvic floor - BENIGN SKELETAL MUSCLE AND SOFT TISSUE. - NO MALIGNANCY IDENTIFIED. 3. Colon, segmental resection, proximal sigmoid - BENIGN COLONIC MUCOSA. - NO DYSPLASIA OR MALIGNANCY. 4. Liver, biopsy, liver mass - BENIGN VASCULAR PROLIFERATION CONSISTENT WITH HEMANGIOMA. - NO MALIGNANCY IDENTIFIED. Microscopic Comment 1. COLON AND RECTUM (INCLUDING TRANS-ANAL RESECTION): Specimen: Rectosigmoid colon with anus. Procedure: Abdominoperineal resection. Tumor site: Distal 1/3 rectum to anus. Specimen integrity: Intact. Macroscopic intactness of mesorectum: Incomplete. Macroscopic  tumor perforation: Not identified. Invasive  tumor: Maximum size: 3.9 cm Histologic type(s): Invasive adenocarcinoma. Histologic grade and differentiation: G2: moderately differentiated/low grade Type of polyp in which invasive carcinoma arose: N/A. Microscopic extension of invasive tumor: Tumor invades through muscularis propria. Lymph-Vascular invasion: Not identified. 1 of 3 FINAL for DELANEY, SCHNICK [phone]) Microscopic Comment(continued) Peri-neural invasion: Present. Tumor deposit(s) (discontinuous extramural extension): Not identified. Resection margins: Proximal margin: Negative. Distal margin: Negative. Circumferential (radial) (posterior ascending, posterior descending; lateral and posterior mid-rectum; and entire lower 1/3 rectum): Negative. Mesenteric margin (sigmoid and transverse): Negative. Distance closest margin (if all above margins negative): <0.1 cm radial margin. >1 cm distal margin. Treatment effect (neo-adjuvant therapy): Present Additional polyp(s): N/A. Non-neoplastic findings: None. Lymph nodes: number examined 8; number positive: 0 (treatment effect present). Pathologic Staging: ypT3, ypN0, ypMX Ancillary studies: Can be performed upon request. Comment: The fat was cleared and additional lymph nodes were not identified.  Diagnosis 10/09/2016 Rectum, biopsy, mass - ADENOCARCINOMA.  RADIOGRAPHIC STUDIES: I have personally reviewed the radiological images as listed and agreed with the findings in the report.  MRI Pelvis W WO Contrast 01/29/17 IMPRESSION: Significant decrease in size of soft tissue mass at the anorectal junction, with post radiation changes. No residual perirectal lymphadenopathy or other signs of pelvic metastatic disease. Stable small right inguinal hernia containing small bowel.  CT A/P W Contrast 12/07/16 IMPRESSION: 1. Abnormal dilatation of the proximal small bowel loops worrisome for either small bowel obstruction versus small  bowel ileus. 2. 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 3. Large right inguinal hernia containing edematous loop of distal small bowel 4. No pneumatosis or bowel perforation.  No abscess. 5. Aortic atherosclerosis 6. Mild decrease in size of rectal mass. No change in right common iliac adenopathy.  DG Chest 2 View 12/03/2016 IMPRESSION: 1. No active cardiopulmonary disease. No evidence of pneumonia or pulmonary edema. No evidence of intrathoracic metastasis. 2. Hyperexpanded lungs suggesting COPD.  MRI Pelvis 10/24/2016 IMPRESSION: 1. Moderately motion degraded exam. 2. Large mass is centered at the anus with extension into the low rectum. No complicating obstruction. 3. Mild mesorectal adenopathy, suspicious. 4. Right inguinal hernia containing nonobstructive small bowel.  CT CAP w Contrast 10/13/2016 IMPRESSION: Large irregular enhancing rectal mass consistent with known rectal cancer. There are small perirectal and small pelvic sidewall lymph nodes which are suspicious. Prominent perirectal vessels are also noted. No evidence of metastatic retroperitoneal or sigmoid mesocolon adenopathy or hepatic or pulmonary metastasis.  COLONOSOCPY 10/09/2016 Dr. Hilarie Fredrickson  A fungating, infiltrative and ulcerated partially obstructing large mass was found in the distal rectum. The mass was circumferential. The mass measured seven cm in length (prolapsing and from dentate line to approx 7 cm). In addition, its inner diameter measured nine mm. Oozing was present. This was biopsied with a cold forceps for histology. - Due to the distal rectal mass the bowel preparation was poor and unable to be cleared via the adult upper endoscope (adult upper endoscope used due to obstructing mass). Scope not advanced proximal to the rectosigmoid colon.  ASSESSMENT & PLAN: 61 y.o.  gentleman, previously  healthy, presented with a anal mass and a mild intermittent rectal bleeding for 5-6 months. Fatigue, anorexia, fatigue and weight loss for a month. He is currently on radiation for his rectal cancer, chemotherapy has been held lately.  1. Rectal Adenocarcinoma, low rectum, cT3N1bM0, ypT3N0, stage IIIB, MSI-pending  -I previously reviewed his CT and MRI scan, colonoscopy and biopsy  findings in detail with pt and his family members -Based on his imaging findings, he has clinical stage III disease. We reviewed his image in our GI tumor Board, he also has a small liver lesion which is indeterminate, likely benign, and we will follow up with a repeated CT in about 3 months.  -patient received neoadjuvant chemotherapy and radiation with Xeloda, which was held for the last few weeks due to infectious colitis. -He underwent APR on 02/21/2017, surgical margins were negative, all lymph nodes were negative. We've reviewed his surgical pathology findings he did have significant treatment effect on his surgical sample.  - We discussed the role of adjuvant chemotherapy. Given his stage III B disease, I recommend adjuvant FOLFOX or CAPOX. The patient opted CAPOX, which is every 3 weeks, for total 6 cycles.  -He recovered well from surgery, will start adjuvant CAPOX 04/04/17. I discussed again the benefit of this chemo therapy and the side effects such as the cold sensitivity and aches.  -I decreased his first cycle of oxaliplatin platinum by 15%, he tolerated well, so we increased to full dose from cycle 2. -due to his working schedule, he wanted to postpone his cycle 3 and 4 oxaliplatin for a week, he still on Xeloda every 3 weeks -His CEA has been mildly elevated, but stable over the last few months.  -he has slightly more noticeable neuropathy, but hand functions are normal, reviewed, adequate for treatment, will proceed with cycle 5 oxaliplatin and Xeloda today. -Will repeat CT scan in the next 1-2 months.  -F/u in  3 weeks, plan to give one more week Xeloda as last cycle adjuvant chemo    2. 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 -Hemoglobin was 8.3 initially with low MCV, likely anemia of iron deficiency from chronic blood loss. This was confirmed by his iron study. 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 -previously repeated iron study still showed low iron, his anemia has gotten worse lately, I previously recommend him to take oral iron 1-2 tab a day for a month.  -He restarted oral iron.  -I suggested he watches his BM while on oral iron.  -his repeat iron level on 03/14/17 is 15 with saturation 5%, ferritin is normal, I gave one dose IV iron infusion, anemia has improved - his Hg is 10.1 (04/04/17) and improved to 11.5 on 05/23/17 -on 06/19/17 the patient reports he has been off iron for a few months, saturation 11%, ferritin is normal. I recommended he restart oral iron 1 tablet daily   3.  Fatigue -Likely the combination of anemia, chemotherapy and radiation and surgery. -I encouraged him to be well, and exercise  4. Physical therapy -Post surgery PT would like help with pelvic floor -continue PT   5. Peripheral neuropathy, secondary to chemo  -mild numbness, no decreased sensation on exam, hand function is normal  -continue monitoring closely     Plan -Labs adequate to Proceed with CAPOX today, last cycle oxaliplatin today  -Lab and f/u in 3 weeks, and will give one more week Xeloda in 3 weeks     All questions were answered. The patient knows to call  the clinic with any problems, questions or  concerns.  I spent 25 minutes counseling the patient face to face. The total time spent in the appointment was 30 minutes and more than 50% was on counseling.  This document serves as a record of services personally performed by Truitt Merle, MD. It was created on her behalf by Joslyn Devon, a trained medical scribe. The creation of this record is based on the scribe's personal observations and the provider's statements to them.    I have reviewed the above documentation for accuracy and completeness, and I agree with the above.   Truitt Merle, MD 07/16/2017

## 2017-07-16 ENCOUNTER — Other Ambulatory Visit (HOSPITAL_BASED_OUTPATIENT_CLINIC_OR_DEPARTMENT_OTHER): Payer: BLUE CROSS/BLUE SHIELD

## 2017-07-16 ENCOUNTER — Ambulatory Visit (HOSPITAL_BASED_OUTPATIENT_CLINIC_OR_DEPARTMENT_OTHER): Payer: BLUE CROSS/BLUE SHIELD

## 2017-07-16 ENCOUNTER — Encounter: Payer: Self-pay | Admitting: Hematology

## 2017-07-16 ENCOUNTER — Ambulatory Visit (HOSPITAL_BASED_OUTPATIENT_CLINIC_OR_DEPARTMENT_OTHER): Payer: BLUE CROSS/BLUE SHIELD | Admitting: Hematology

## 2017-07-16 VITALS — BP 130/75 | HR 71 | Temp 97.5°F | Resp 18 | Ht 72.0 in | Wt 151.6 lb

## 2017-07-16 DIAGNOSIS — C20 Malignant neoplasm of rectum: Secondary | ICD-10-CM | POA: Diagnosis not present

## 2017-07-16 DIAGNOSIS — Z5111 Encounter for antineoplastic chemotherapy: Secondary | ICD-10-CM

## 2017-07-16 DIAGNOSIS — Z148 Genetic carrier of other disease: Secondary | ICD-10-CM

## 2017-07-16 DIAGNOSIS — G620 Drug-induced polyneuropathy: Secondary | ICD-10-CM | POA: Diagnosis not present

## 2017-07-16 DIAGNOSIS — R5383 Other fatigue: Secondary | ICD-10-CM | POA: Diagnosis not present

## 2017-07-16 DIAGNOSIS — D500 Iron deficiency anemia secondary to blood loss (chronic): Secondary | ICD-10-CM

## 2017-07-16 LAB — IRON AND TIBC
%SAT: 13 % — AB (ref 20–55)
IRON: 59 ug/dL (ref 42–163)
TIBC: 455 ug/dL — AB (ref 202–409)
UIBC: 396 ug/dL — ABNORMAL HIGH (ref 117–376)

## 2017-07-16 LAB — CBC WITH DIFFERENTIAL/PLATELET
BASO%: 0.7 % (ref 0.0–2.0)
Basophils Absolute: 0 10*3/uL (ref 0.0–0.1)
EOS ABS: 0 10*3/uL (ref 0.0–0.5)
EOS%: 0.7 % (ref 0.0–7.0)
HEMATOCRIT: 37.8 % — AB (ref 38.4–49.9)
HGB: 12.5 g/dL — ABNORMAL LOW (ref 13.0–17.1)
LYMPH%: 17.7 % (ref 14.0–49.0)
MCH: 33.8 pg — AB (ref 27.2–33.4)
MCHC: 33.1 g/dL (ref 32.0–36.0)
MCV: 102.2 fL — AB (ref 79.3–98.0)
MONO#: 0.6 10*3/uL (ref 0.1–0.9)
MONO%: 13.5 % (ref 0.0–14.0)
NEUT#: 2.7 10*3/uL (ref 1.5–6.5)
NEUT%: 67.4 % (ref 39.0–75.0)
PLATELETS: 160 10*3/uL (ref 140–400)
RBC: 3.7 10*6/uL — AB (ref 4.20–5.82)
RDW: 22.4 % — ABNORMAL HIGH (ref 11.0–14.6)
WBC: 4.1 10*3/uL (ref 4.0–10.3)
lymph#: 0.7 10*3/uL — ABNORMAL LOW (ref 0.9–3.3)
nRBC: 0 % (ref 0–0)

## 2017-07-16 LAB — COMPREHENSIVE METABOLIC PANEL
ALBUMIN: 3.5 g/dL (ref 3.5–5.0)
ALT: 39 U/L (ref 0–55)
AST: 42 U/L — AB (ref 5–34)
Alkaline Phosphatase: 141 U/L (ref 40–150)
Anion Gap: 10 mEq/L (ref 3–11)
BUN: 12 mg/dL (ref 7.0–26.0)
CO2: 22 meq/L (ref 22–29)
CREATININE: 0.8 mg/dL (ref 0.7–1.3)
Calcium: 9 mg/dL (ref 8.4–10.4)
Chloride: 105 mEq/L (ref 98–109)
GLUCOSE: 133 mg/dL (ref 70–140)
Potassium: 3.6 mEq/L (ref 3.5–5.1)
SODIUM: 137 meq/L (ref 136–145)
TOTAL PROTEIN: 7.5 g/dL (ref 6.4–8.3)
Total Bilirubin: 0.66 mg/dL (ref 0.20–1.20)

## 2017-07-16 LAB — FERRITIN: FERRITIN: 75 ng/mL (ref 22–316)

## 2017-07-16 LAB — CEA (IN HOUSE-CHCC): CEA (CHCC-In House): 9.88 ng/mL — ABNORMAL HIGH (ref 0.00–5.00)

## 2017-07-16 MED ORDER — PALONOSETRON HCL INJECTION 0.25 MG/5ML
0.2500 mg | Freq: Once | INTRAVENOUS | Status: AC
  Administered 2017-07-16: 0.25 mg via INTRAVENOUS

## 2017-07-16 MED ORDER — DEXAMETHASONE SODIUM PHOSPHATE 10 MG/ML IJ SOLN
10.0000 mg | Freq: Once | INTRAMUSCULAR | Status: AC
  Administered 2017-07-16: 10 mg via INTRAVENOUS

## 2017-07-16 MED ORDER — DEXTROSE 5 % IV SOLN
130.0000 mg/m2 | Freq: Once | INTRAVENOUS | Status: AC
  Administered 2017-07-16: 235 mg via INTRAVENOUS
  Filled 2017-07-16: qty 47

## 2017-07-16 MED ORDER — DEXTROSE 5 % IV SOLN
Freq: Once | INTRAVENOUS | Status: AC
  Administered 2017-07-16: 15:00:00 via INTRAVENOUS

## 2017-07-16 NOTE — Patient Instructions (Signed)
[facility] Discharge Instructions for Patients Receiving Chemotherapy  Today you received the following chemotherapy agents: Oxaliplatin  To help prevent nausea and vomiting after your treatment, we encourage you to take your nausea medication as directed.   If you develop nausea and vomiting that is not controlled by your nausea medication, call the clinic.   BELOW ARE SYMPTOMS THAT SHOULD BE REPORTED IMMEDIATELY:  *FEVER GREATER THAN 100.5 F  *CHILLS WITH OR WITHOUT FEVER  NAUSEA AND VOMITING THAT IS NOT CONTROLLED WITH YOUR NAUSEA MEDICATION  *UNUSUAL SHORTNESS OF BREATH  *UNUSUAL BRUISING OR BLEEDING  TENDERNESS IN MOUTH AND THROAT WITH OR WITHOUT PRESENCE OF ULCERS  *URINARY PROBLEMS  *BOWEL PROBLEMS  UNUSUAL RASH Items with * indicate a potential emergency and should be followed up as soon as possible.  Feel free to call the clinic should you have any questions or concerns. The clinic phone number is [phone].  Please show the CHEMO ALERT CARD at check-in to the Emergency Department and triage nurse.   

## 2017-07-24 ENCOUNTER — Telehealth: Payer: Self-pay | Admitting: Hematology

## 2017-07-24 NOTE — Telephone Encounter (Signed)
Left message for patient re December appointments. Schedule mailed.

## 2017-07-25 ENCOUNTER — Encounter: Payer: Self-pay | Admitting: Internal Medicine

## 2017-07-25 ENCOUNTER — Ambulatory Visit (INDEPENDENT_AMBULATORY_CARE_PROVIDER_SITE_OTHER): Payer: BLUE CROSS/BLUE SHIELD | Admitting: Internal Medicine

## 2017-07-25 VITALS — BP 124/78 | HR 72 | Ht 72.0 in | Wt 149.4 lb

## 2017-07-25 DIAGNOSIS — C20 Malignant neoplasm of rectum: Secondary | ICD-10-CM | POA: Diagnosis not present

## 2017-07-25 DIAGNOSIS — Z933 Colostomy status: Secondary | ICD-10-CM

## 2017-07-25 MED ORDER — SUPREP BOWEL PREP KIT 17.5-3.13-1.6 GM/177ML PO SOLN: 1.0000 | ORAL | 0 refills | Status: DC

## 2017-07-25 NOTE — Patient Instructions (Signed)
You have been scheduled for a colonoscopy. Please follow written instructions given to you at your visit today.  Please pick up your prep supplies at the pharmacy within the next 1-3 days. If you use inhalers (even only as needed), please bring them with you on the day of your procedure. Your physician has requested that you go to www.startemmi.com and enter the access code given to you at your visit today. This web site gives a general overview about your procedure. However, you should still follow specific instructions given to you by our office regarding your preparation for the procedure.  If you are age 47 or older, your body mass index should be between 23-30. Your Body mass index is 20.26 kg/m. If this is out of the aforementioned range listed, please consider follow up with your Primary Care Provider.  If you are age 57 or younger, your body mass index should be between 19-25. Your Body mass index is 20.26 kg/m. If this is out of the aformentioned range listed, please consider follow up with your Primary Care Provider.

## 2017-07-25 NOTE — Progress Notes (Signed)
Patient ID: David Clarke, male   DOB: 04/30/1956, 61 y.o.   MRN: [id number] HPI: David Clarke is a 61 year old male with a history of rectal cancer status post abdominal perineal resection with end colostomy, status post radiation and chemotherapy who is here for follow-up.  He is here alone today.  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  I performed a colonoscopy which was incomplete due to the obstructing nature of his distal rectal mass.  He is then been followed by oncology, radiation oncology, and Dr. Johney Clarke with surgery.  He has done well though he was hospitalized earlier this year for an infectious enteritis/colitis.  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  He reports his ostomy is functioning well.  He will have a bowel movement per ostomy every 2-3 days.  No blood in his stool or melena.  No abdominal pain or pelvic pain.  He has had a good appetite.  He has continued to work.  Energy levels have been good for him lately.  No upper GI or hepatobiliary complaints today.  He is currently taking Xeloda and cycles and feels that this may be stopped soon per Dr. Burr Clarke.  He has follow-up with her in about 2 weeks.  Past Medical History:  Diagnosis Date  . Allergy   . Cancer ([place]) 10/09/2016   rectal adenocarcinoma  . History of hiatal hernia     Past Surgical History:  Procedure Laterality Date  . COLONOSCOPY W/ BIOPSIES Bilateral 10/09/2016   rectal adenocarcinoma  . MOUTH SURGERY     age 53, to remove extra "eye" teeth  . [place] teeth extraction    . XI ROBOT ABDOMINAL PERINEAL RESECTION N/A 02/21/2017   Procedure: XI ROBOT ABDOMINOPERINEAL RESECTION WITH COLOSTOMY;  Surgeon: David Boston, MD;  Location: [facility];  Service: General;  Laterality: N/A;    Outpatient Medications Prior to Visit  Medication Sig Dispense Refill  . capecitabine (XELODA) 500 MG tablet Take 4 tab in the morning and 3 tab in the evening, on days 1-14 of chemotherapy every 21  days. 98 tablet 1  . IRON, FERROUS SULFATE, PO Take 65 mg daily by mouth.    . Multiple Vitamin (MULTIVITAMIN WITH MINERALS) TABS tablet Take 1 tablet by mouth 3 (three) times a week.    . ondansetron (ZOFRAN) 8 MG tablet Take 1 tablet (8 mg total) by mouth 2 (two) times daily as needed for refractory nausea / vomiting. Start on day 3 after chemotherapy. 30 tablet 1  . prochlorperazine (COMPAZINE) 10 MG tablet Take 1 tablet (10 mg total) by mouth every 6 (six) hours as needed (Nausea or vomiting). 30 tablet 1  . acetaminophen (TYLENOL) 500 MG tablet Take 1,000 mg by mouth 3 (three) times daily.     Facility-Administered Medications Prior to Visit  Medication Dose Route Frequency Provider Last Rate Last Dose  . 0.9 %  sodium chloride infusion  500 mL Intravenous Continuous Ayanna Gheen, Lajuan Lines, MD        Allergies  Allergen Reactions  . Sulfa Antibiotics Nausea Only    Reaction may years ago, pt thinks nausea only.     Family History  Problem Relation Age of Onset  . Other Mother        bile duct tumor  . Stroke Mother   . Cancer Mother 80       bile duct cancer   . Prostate cancer Father 27  . Emphysema Father   .  Colon cancer Neg Hx   . Esophageal cancer Neg Hx   . Pancreatic cancer Neg Hx   . Rectal cancer Neg Hx   . Stomach cancer Neg Hx     Social History   Tobacco Use  . Smoking status: Former Smoker    Years: 2.00    Types: Cigarettes    Last attempt to quit: 2002    Years since quitting: 16.9  . Smokeless tobacco: Never Used  Substance Use Topics  . Alcohol use: Yes    Alcohol/week: 6.0 oz    Types: 4 Glasses of wine, 6 Cans of beer per week    Comment: beer or a glass or wine most days of the week  . Drug use: No    ROS: As per history of present illness, otherwise negative  BP 124/78   Pulse 72   Ht 6' (1.829 m)   Wt 149 lb 6 oz (67.8 kg)   BMI 20.26 kg/m  Constitutional: Well-developed and well-nourished. No distress. HEENT: Normocephalic and  atraumatic. No scleral icterus. Neck: Neck supple. Trachea midline. Cardiovascular: Normal rate, regular rhythm and intact distal pulses. No M/R/G Pulmonary/chest: Effort normal and breath sounds normal. No wheezing, rales or rhonchi. Abdominal: Soft, nontender, nondistended. Bowel sounds active throughout. There are no masses palpable.  Ostomy left lower quadrant Extremities: no clubbing, cyanosis, or edema Neurological: Alert and oriented to person place and time. Skin: Skin is warm and dry.  Psychiatric: Normal mood and affect. Behavior is normal.  RELEVANT LABS AND IMAGING: CBC    Component Value Date/Time   WBC 4.1 07/16/2017 1323   WBC 9.8 02/22/2017 0409   RBC 3.70 (L) 07/16/2017 1323   RBC 3.20 (L) 02/22/2017 0409   HGB 12.5 (L) 07/16/2017 1323   HCT 37.8 (L) 07/16/2017 1323   PLT 160 07/16/2017 1323   MCV 102.2 (H) 07/16/2017 1323   MCH 33.8 (H) 07/16/2017 1323   MCH 27.8 02/22/2017 0409   MCHC 33.1 07/16/2017 1323   MCHC 31.0 02/22/2017 0409   RDW 22.4 (H) 07/16/2017 1323   LYMPHSABS 0.7 (L) 07/16/2017 1323   MONOABS 0.6 07/16/2017 1323   EOSABS 0.0 07/16/2017 1323   BASOSABS 0.0 07/16/2017 1323    CMP     Component Value Date/Time   NA 137 07/16/2017 1323   K 3.6 07/16/2017 1323   CL 105 02/22/2017 0409   CO2 22 07/16/2017 1323   GLUCOSE 133 07/16/2017 1323   BUN 12.0 07/16/2017 1323   CREATININE 0.8 07/16/2017 1323   CALCIUM 9.0 07/16/2017 1323   PROT 7.5 07/16/2017 1323   ALBUMIN 3.5 07/16/2017 1323   AST 42 (H) 07/16/2017 1323   ALT 39 07/16/2017 1323   ALKPHOS 141 07/16/2017 1323   BILITOT 0.66 07/16/2017 1323   GFRNONAA >60 02/22/2017 0409   GFRAA >60 02/22/2017 0409    ASSESSMENT/PLAN: 61 year old male with a history of rectal cancer status post abdominal perineal resection with end colostomy, status post radiation and chemotherapy who is here for follow-up.   1.  History of rectal cancer status post radiation with ongoing chemotherapy --he  will need colonoscopy when felt okay by Dr. Burr Clarke.  His remaining colon has never been examined due to the obstructing nature of his rectal cancer.  Timing of this will be deferred to Dr. Burr Clarke and when felt safe regarding his chemotherapy and treatment.  I will reach out to her to determine when to proceed with this examination.  He will then be on  surveillance with colonoscopy based on findings and his history.  He will also have surveillance imaging likely next month.   ID:[id number], [name], [place], [place] [id number]   CC: Dr. Burr Clarke

## 2017-07-31 ENCOUNTER — Telehealth: Payer: Self-pay | Admitting: *Deleted

## 2017-07-31 ENCOUNTER — Ambulatory Visit: Payer: BLUE CROSS/BLUE SHIELD | Admitting: Hematology

## 2017-07-31 NOTE — Telephone Encounter (Signed)
Left message on pt's vm to call back to discuss refill on xeloda.  Pt needs to call [facility].

## 2017-07-31 NOTE — Telephone Encounter (Signed)
Pt returned call & states he has already discussed with [facility] & delivery should be set up.  Called [facility] to verify & was informed that delivery will be scheduled.

## 2017-08-06 ENCOUNTER — Encounter: Payer: Self-pay | Admitting: Internal Medicine

## 2017-08-06 ENCOUNTER — Other Ambulatory Visit: Payer: BLUE CROSS/BLUE SHIELD

## 2017-08-06 ENCOUNTER — Ambulatory Visit: Payer: BLUE CROSS/BLUE SHIELD | Admitting: Hematology

## 2017-08-09 ENCOUNTER — Telehealth: Payer: Self-pay | Admitting: Internal Medicine

## 2017-08-09 NOTE — Progress Notes (Signed)
[place]  [phone]) [phone] Fax:([phone]) [phone]  Clinic Follow Up Note   Patient Care Team: Robyn Haber, MD as PCP - General (Family Medicine) Michael Boston, MD as Consulting Physician (General Surgery) Pyrtle, Lajuan Lines, MD as Consulting Physician (Gastroenterology) Truitt Merle, MD as Consulting Physician (Hematology and Oncology) Kyung Rudd, MD as Consulting Physician (Radiation Oncology)   Date of Service:  08/10/2017  CHIEF COMPLAINTS:  Follow up rectal adenocarcinoma  Oncology History   Cancer Staging Rectal adenocarcinoma  Staging form: Colon and Rectum, AJCC 8th Edition - Clinical stage from 10/09/2016: Stage IIIB (cT3, cN1b, cM0) - Signed by Truitt Merle, MD on 10/25/2016       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      10/09/2016 Initial Diagnosis    Rectal adenocarcinoma       10/09/2016 Procedure    Colonoscopy showed a fungating, infiltrative and ulcerated partially obstructing large mass in the distal rectum, measuring 9 cm, prolapsing at anal canal. Scope not advanced proximal to the rectosigmoid colon.      10/09/2016 Initial Biopsy    Rectal mass biopsy showed invasive adenocarcinoma.      10/13/2016 Imaging    CT Chest Abdomen Pelvis w contrast IMPRESSION: Large irregular enhancing rectal mass consistent with known rectal cancer. There are small perirectal and small pelvic sidewall lymph nodes which are suspicious. Prominent perirectal vessels are also noted. No evidence of metastatic retroperitoneal or sigmoid mesocolon adenopathy or hepatic or pulmonary metastasis.      10/24/2016 Imaging    MRI Pelvis IMPRESSION: 1. Moderately motion degraded exam. 2. Large mass is centered at the anus with extension into the low rectum. No complicating obstruction. 3. Mild  mesorectal adenopathy, suspicious. 4. Right inguinal hernia containing nonobstructive small bowel.       11/08/2016 - 12/19/2016 Chemotherapy    Xeloda 50 mg twice daily, with concurrent radiation, held since 12/03/2016 due to colitis and hospitalization      11/08/2016 - 12/19/2016 Radiation Therapy    Neoadjuvant radiation to his rectal cancer  1) Pelvis/ 45 Gy in 25 fractions  2) Rectum boost/ 9 Gy in 5 fractions Under the care of Dr. Lisbeth Renshaw.      12/03/2016 - 12/09/2016 Hospital Admission    He presented with few days to one week history of diarrhea with 6-7 loose stools daily, along with a fever of 101.5 on the day of admission. Patient was also noted to be hypokalemic, hyponatremic. Hospitalized for further management. Stool studies were sent. C. difficile was negative. GI pathogen panel was positive for Yersinia.  XELODA was held at this time, until infection has cleared.       12/07/2016 Imaging    CT A/P W Contrast  IMPRESSION: 1. Abnormal dilatation of the proximal small bowel loops worrisome for either small bowel obstruction versus small bowel ileus. 2. 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 3. Large right inguinal hernia containing edematous loop of distal small bowel 4. No pneumatosis or bowel perforation.  No abscess. 5. Aortic atherosclerosis 6. Mild decrease in size of rectal mass. No change in right common iliac adenopathy.      02/21/2017 Surgery    XI ROBOT ABDOMINOPERINEAL RESECTION WITH COLOSTOMY by Dr. Johney Maine  02/21/2017 Pathology Results    Diagnosis 02/21/17 1. Colon, segmental resection for tumor, rectosigmoid - INVASIVE ADENOCARCINOMA, MODERATELY DIFFERENTIATED, SPANNING 3.9 CM. - TUMOR INVADES THROUGH MUSCULARIS PROPRIA. - RESECTION MARGINS ARE NEGATIVE. - EIGHT OF EIGHT LYMPH NODES NEGATIVE FOR CARCINOMA (0/8), SEE COMMENT. - SEE ONCOLOGY TABLE. 2.  Soft tissue, biopsy, left lateral pelvic floor - BENIGN SKELETAL MUSCLE AND SOFT TISSUE. - NO MALIGNANCY IDENTIFIED. 3. Colon, segmental resection, proximal sigmoid - BENIGN COLONIC MUCOSA. - NO DYSPLASIA OR MALIGNANCY. 4. Liver, biopsy, liver mass - BENIGN VASCULAR PROLIFERATION CONSISTENT WITH HEMANGIOMA. - NO MALIGNANCY IDENTIFIED.      04/04/2017 -  Chemotherapy    CAPOX every 3 weeks for 6 cycles. I decreased his first cycle of oxaliplatin platinum by 15%, he tolerated well, so we increased to full dose from cycle 2. He completed 5 cycles of oxaliplatin on 07/16/17 and completed Xeloda on 08/12/17.        HISTORY OF PRESENTING ILLNESS (10/25/16):  David Clarke 61 y.o. male is here because of his recently diagnosed rectal adenocarcinoma. He was referred by his gastroenterologist title parietal. Patient presents to my clinic, accompanied by his wife and his daughter.       He has noticed a anal mass accompanied by intermittent bleeding and pain in the rectum for the past 5 months. The patient initially presented to the [facility] ED on 10/03/16 with rectal bleeding and difficulty with hygiene. He was without health insurance previously and so did not pursue evaluation or treatment until this time.  The patient was seen by Dr. Hilarie Fredrickson for a colonoscopy and biopsy on 10/09/16, revealing a large partially obstructing low rectal mass, prolapsing to anal verge. Biopsy showed adenocarcinoma. CT C/A/P on 10/13/16 revealed a large, irregular, enhancing mass consistent with known rectal cancer. There were several suspicious small perirectal and small pelvic sidewall lymph nodes. Prominent perirectal vessels were also noted. MRI of the pelvis on 10/24/16 showed a large mass centered at the anus with extension into the low rectum without obstruction. There was suspicious mild mesorectal adenopathy, and a right inguinal hernia containing nonobstructive small bowel.   The patient comes to the clinic for  consultation today accompanied by his partner and daughter. He denies abdominal cramps, nausea, bowel or bladder concerns. He reports taking OTC stool softener as needed. He reports his appetite is good, but he has lost 12-15 lbs in the past few weeks. His daughter notes she has hemochromatosis and questions if this could be playing a role in the patient's anemia.  CURRENT THERAPY:  Adjuvant CAPOX every 3 weeks for 6 cycles, started 04/04/17, completed oxaliplatin after 5 cycles on 07/16/17. Plan to complete Xeloda on 08/12/17.   INTERIM HISTORY:  David Clarke presents for follow up. He presents to the clinic today with his wife.  He notes things are going well. He started his current cycle Xeloda back on 08/06/17. He notes he has been working more lately which has caused more feet swelling. He notes his neuropathy is mostly stable. The numbness of his feet comes and goes, it is not debilitating. He notes having dryness of his hands and feet. He wants to work on his BMs becoming more regular. He says Dr. Hilarie Fredrickson is to contact Dr. Burr Medico about doing a colonoscopy on the 08/15/17. The patient would like to do it before the end of the year. He plans to complete Xeloda on 08/12/17. He notes he is gaining weight back.     MEDICAL HISTORY:  Past  Medical History:  Diagnosis Date   Allergy    Cancer ([place]) 10/09/2016   rectal adenocarcinoma   History of hiatal hernia     SURGICAL HISTORY: Past Surgical History:  Procedure Laterality Date   COLONOSCOPY W/ BIOPSIES Bilateral 10/09/2016   rectal adenocarcinoma   MOUTH SURGERY     age 63, to remove extra "eye" teeth   [place] teeth extraction     XI ROBOT ABDOMINAL PERINEAL RESECTION N/A 02/21/2017   Procedure: XI ROBOT ABDOMINOPERINEAL RESECTION WITH COLOSTOMY;  Surgeon: Michael Boston, MD;  Location: [facility];  Service: General;  Laterality: N/A;    SOCIAL HISTORY: Social History   Socioeconomic History   Marital status: Single    Spouse name:  Not on file   Number of children: 1   Years of education: Not on file   Highest education level: Not on file  Social Needs   Financial resource strain: Not on file   Food insecurity - worry: Not on file   Food insecurity - inability: Not on file   Transportation needs - medical: Not on file   Transportation needs - non-medical: Not on file  Occupational History   Occupation: Architectural technologist  Tobacco Use   Smoking status: Former Smoker    Years: 2.00    Types: Cigarettes    Last attempt to quit: 2002    Years since quitting: 16.9   Smokeless tobacco: Never Used  Substance and Sexual Activity   Alcohol use: Yes    Alcohol/week: 6.0 oz    Types: 4 Glasses of wine, 6 Cans of beer per week    Comment: beer or a glass or wine most days of the week   Drug use: No   Sexual activity: Yes    Partners: Female  Other Topics Concern   Not on file  Social History Narrative   Not on file    FAMILY HISTORY: Family History  Problem Relation Age of Onset   Other Mother        bile duct tumor   Stroke Mother    Cancer Mother 58       bile duct cancer    Prostate cancer Father 45   Emphysema Father    Colon cancer Neg Hx    Esophageal cancer Neg Hx    Pancreatic cancer Neg Hx    Rectal cancer Neg Hx    Stomach cancer Neg Hx     ALLERGIES:  is allergic to sulfa antibiotics.  MEDICATIONS:  Current Outpatient Medications  Medication Sig Dispense Refill   capecitabine (XELODA) 500 MG tablet Take 4 tab in the morning and 3 tab in the evening, on days 1-14 of chemotherapy every 21 days. 98 tablet 1   IRON, FERROUS SULFATE, PO Take 65 mg daily by mouth.     Multiple Vitamin (MULTIVITAMIN WITH MINERALS) TABS tablet Take 1 tablet by mouth 3 (three) times a week.     ondansetron (ZOFRAN) 8 MG tablet Take 1 tablet (8 mg total) by mouth 2 (two) times daily as needed for refractory nausea / vomiting. Start on day 3 after chemotherapy. 30 tablet 1    prochlorperazine (COMPAZINE) 10 MG tablet Take 1 tablet (10 mg total) by mouth every 6 (six) hours as needed (Nausea or vomiting). 30 tablet 1   SUPREP BOWEL PREP KIT 17.5-3.13-1.6 GM/177ML SOLN Take 1 kit by mouth as directed. 354 mL 0   Current Facility-Administered Medications  Medication Dose Route Frequency Provider Last Rate Last Dose  0.9 %  sodium chloride infusion  500 mL Intravenous Continuous Pyrtle, Lajuan Lines, MD        REVIEW OF SYSTEMS:   Constitutional: Denies fevers, chills or abnormal night sweats (+) weight gain  Eyes: Denies blurriness of vision, double vision or watery eyes Ears, nose, mouth, throat, and face: Denies mucositis or sore throat   Respiratory: Denies cough, dyspnea or wheezes Cardiovascular: Denies palpitation, chest discomfort or (+) bilateral feet swelling Gastrointestinal:  Denies nausea, heartburn or change in bowel habits (+) colostomy bag, BM every 2-3 days Skin: (+) dry hands and feet Lymphatics: Denies new lymphadenopathy or easy bruising MSK: (+) Hand and wrist joint sensitivity  Neurological: (+) stable neuropathy (+) occasional mild feet numbness Behavioral/Psych: Mood is stable, no new changes  All other systems were reviewed with the patient and are negative.   PHYSICAL EXAMINATION: ECOG PERFORMANCE STATUS: 1 - Symptomatic but completely ambulatory  Vitals:   08/10/17 0855  BP: 122/66  Pulse: 64  Resp: 18  Temp: 97.7 F (36.5 C)  SpO2: 98%   Filed Weights   08/10/17 0855  Weight: 153 lb 14.4 oz (69.8 kg)     GENERAL: alert, no distress and comfortable.  HEENT: (+) thrush resolved SKIN: skin color, texture, turgor are normal, no rashes or significant lesions NECK: supple, thyroid normal size, non-tender, without nodularity LYMPH: no palpable lymphadenopathy in the cervical, axillary or inguinal LUNGS: clear to auscultation and percussion with normal breathing effort HEART: regular rate & rhythm and no murmurs and no lower  extremity edema ABDOMEN: abdomen soft, non-tender and normal bowel sounds (+) colostomy bag in left Upper Quadrant with pink liquid and no stool (+) laparoscopic incisions all healed Musculoskeletal: no cyanosis of digits and no clubbing  PSYCH: alert & oriented x 3 with fluent speech NEURO: no focal motor/sensory deficits.  RECTAL:  (+) reconstruction has healed without discharge or skin redness.    LABORATORY DATA:  I have reviewed the data as listed CBC Latest Ref Rng & Units 08/10/2017 07/16/2017 06/19/2017  WBC 4.0 - 10.3 10e3/uL 3.4(L) 4.1 4.3  Hemoglobin 13.0 - 17.1 g/dL 12.3(L) 12.5(L) 12.1(L)  Hematocrit 38.4 - 49.9 % 37.1(L) 37.8(L) 38.1(L)  Platelets 140 - 400 10e3/uL 123(L) 160 215   CMP Latest Ref Rng & Units 08/10/2017 07/16/2017 06/19/2017  Glucose 70 - 140 mg/dl 133 133 101  BUN 7.0 - 26.0 mg/dL 16.5 12.0 12.1  Creatinine 0.7 - 1.3 mg/dL 0.8 0.8 0.8  Sodium 136 - 145 mEq/L 137 137 138  Potassium 3.5 - 5.1 mEq/L 3.5 3.6 4.0  Chloride 101 - 111 mmol/L - - -  CO2 22 - 29 mEq/L 21(L) 22 24  Calcium 8.4 - 10.4 mg/dL 9.0 9.0 9.3  Total Protein 6.4 - 8.3 g/dL 7.1 7.5 7.4  Total Bilirubin 0.20 - 1.20 mg/dL 1.32(H) 0.66 0.69  Alkaline Phos 40 - 150 U/L 137 141 117  AST 5 - 34 U/L 47(H) 42(H) 31  ALT 0 - 55 U/L 38 39 24     CEA ([facility]) 10/27/2016: 7.81 11/27/2016: 5.56 01/10/2017: 5.61 03/14/17: 5.11 04/11/17: 5.87 05/23/17: 8.79 06/19/17:  9.15 07/16/17:  9.88 08/10/17: 10.40   PATHOLOGY:   Diagnosis 02/21/17 1. Colon, segmental resection for tumor, rectosigmoid - INVASIVE ADENOCARCINOMA, MODERATELY DIFFERENTIATED, SPANNING 3.9 CM. - TUMOR INVADES THROUGH MUSCULARIS PROPRIA. - RESECTION MARGINS ARE NEGATIVE. - EIGHT OF EIGHT LYMPH NODES NEGATIVE FOR CARCINOMA (0/8), SEE COMMENT. - SEE ONCOLOGY TABLE. 2. Soft tissue, biopsy, left lateral pelvic floor -  BENIGN SKELETAL MUSCLE AND SOFT TISSUE. - NO MALIGNANCY IDENTIFIED. 3. Colon, segmental resection,  proximal sigmoid - BENIGN COLONIC MUCOSA. - NO DYSPLASIA OR MALIGNANCY. 4. Liver, biopsy, liver mass - BENIGN VASCULAR PROLIFERATION CONSISTENT WITH HEMANGIOMA. - NO MALIGNANCY IDENTIFIED. Microscopic Comment 1. COLON AND RECTUM (INCLUDING TRANS-ANAL RESECTION): Specimen: Rectosigmoid colon with anus. Procedure: Abdominoperineal resection. Tumor site: Distal 1/3 rectum to anus. Specimen integrity: Intact. Macroscopic intactness of mesorectum: Incomplete. Macroscopic tumor perforation: Not identified. Invasive tumor: Maximum size: 3.9 cm Histologic type(s): Invasive adenocarcinoma. Histologic grade and differentiation: G2: moderately differentiated/low grade Type of polyp in which invasive carcinoma arose: N/A. Microscopic extension of invasive tumor: Tumor invades through muscularis propria. Lymph-Vascular invasion: Not identified. 1 of 3 FINAL for KIZER, NOBBE [phone]) Microscopic Comment(continued) Peri-neural invasion: Present. Tumor deposit(s) (discontinuous extramural extension): Not identified. Resection margins: Proximal margin: Negative. Distal margin: Negative. Circumferential (radial) (posterior ascending, posterior descending; lateral and posterior mid-rectum; and entire lower 1/3 rectum): Negative. Mesenteric margin (sigmoid and transverse): Negative. Distance closest margin (if all above margins negative): <0.1 cm radial margin. >1 cm distal margin. Treatment effect (neo-adjuvant therapy): Present Additional polyp(s): N/A. Non-neoplastic findings: None. Lymph nodes: number examined 8; number positive: 0 (treatment effect present). Pathologic Staging: ypT3, ypN0, ypMX Ancillary studies: Can be performed upon request. Comment: The fat was cleared and additional lymph nodes were not identified.  Diagnosis 10/09/2016 Rectum, biopsy, mass - ADENOCARCINOMA.  RADIOGRAPHIC STUDIES: I have personally reviewed the radiological images as listed and agreed with the  findings in the report.  MRI Pelvis W WO Contrast 01/29/17 IMPRESSION: Significant decrease in size of soft tissue mass at the anorectal junction, with post radiation changes. No residual perirectal lymphadenopathy or other signs of pelvic metastatic disease. Stable small right inguinal hernia containing small bowel.  CT A/P W Contrast 12/07/16 IMPRESSION: 1. Abnormal dilatation of the proximal small bowel loops worrisome for either small bowel obstruction versus small bowel ileus. 2. 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 3. Large right inguinal hernia containing edematous loop of distal small bowel 4. No pneumatosis or bowel perforation.  No abscess. 5. Aortic atherosclerosis 6. Mild decrease in size of rectal mass. No change in right common iliac adenopathy.  DG Chest 2 View 12/03/2016 IMPRESSION: 1. No active cardiopulmonary disease. No evidence of pneumonia or pulmonary edema. No evidence of intrathoracic metastasis. 2. Hyperexpanded lungs suggesting COPD.  MRI Pelvis 10/24/2016 IMPRESSION: 1. Moderately motion degraded exam. 2. Large mass is centered at the anus with extension into the low rectum. No complicating obstruction. 3. Mild mesorectal adenopathy, suspicious. 4. Right inguinal hernia containing nonobstructive small bowel.  CT CAP w Contrast 10/13/2016 IMPRESSION: Large irregular enhancing rectal mass consistent with known rectal cancer. There are small perirectal and small pelvic sidewall lymph nodes which are suspicious. Prominent perirectal vessels are also noted. No evidence of metastatic retroperitoneal or sigmoid mesocolon adenopathy or hepatic or pulmonary metastasis.  COLONOSCOPY 10/09/2016 Dr. Hilarie Fredrickson  A fungating, infiltrative and ulcerated partially obstructing large mass was found in the distal rectum. The mass was circumferential. The mass measured  seven cm in length (prolapsing and from dentate line to approx 7 cm). In addition, its inner diameter measured nine mm. Oozing was present. This was biopsied with a cold forceps for histology. - Due to the distal rectal mass the bowel preparation was poor and unable to be cleared via the adult upper endoscope (adult upper endoscope used due to  obstructing mass). Scope not advanced proximal to the rectosigmoid colon.  ASSESSMENT & PLAN: 61 y.o.  gentleman, previously healthy, presented with a anal mass and a mild intermittent rectal bleeding for 5-6 months. Fatigue, anorexia, fatigue and weight loss for a month. He is currently on radiation for his rectal cancer, chemotherapy has been held lately.  1. Rectal Adenocarcinoma, low rectum, cT3N1bM0, ypT3N0, stage IIIB, MSI-stable  -I previously reviewed his CT and MRI scan, colonoscopy and biopsy findings in detail with pt and his family members -Based on his imaging findings, he has clinical stage III disease. We reviewed his image in our GI tumor Board, he also has a small liver lesion which is indeterminate, likely benign, and we will follow up with a repeated CT in about 3 months.  -patient received neoadjuvant chemotherapy and radiation with Xeloda, which was held for the last few weeks due to infectious colitis. -He underwent APR on 02/21/2017, surgical margins were negative, all lymph nodes were negative. We've reviewed his surgical pathology findings he did have significant treatment effect on his surgical sample.  - We discussed the role of adjuvant chemotherapy. Given his stage III B disease, I recommend adjuvant FOLFOX or CAPOX. The patient opted CAPOX, which is every 3 weeks, for total 6 cycles.  -He recovered well from surgery, started adjuvant CAPOX 04/04/17. I decreased his first cycle of oxaliplatin platinum by 15%, he tolerated well, so we increased to full dose from cycle 2. He completed 5 cycles of oxaliplatin on 07/16/17 and completed  Xeloda on 08/12/17. His oxaliplatin treatment was postponed a few times due to his working schedule -He is finishing adjuvant chemotherapy later this week, we discussed his risk of cancer recurrence again, and we reviewed cancer surveillance plan. -He will proceed with colonoscopy by Dr. Hilarie Fredrickson next week or in 09/2017 if he is not cleared by insurance this year. -Will repeat CT scan at the end of 08/2017 -I explained his risk of recurrence after treatment is about 30%. We will watch him closely the first 2-3 years. After 3 years the risk of recurrence decreases significantly.  A total of 5 years surveillance is recommended. He will repeat scan every 6-12 months and follow him regularly for labs and exam.  -F/u in 6 weeks with surveillance CT scan   2. 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 -Hemoglobin was 8.3 initially with low MCV, likely anemia of iron deficiency from chronic blood loss. This was confirmed by his iron study. 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 -previously repeated iron study still showed low iron, his anemia has gotten worse lately, I previously recommend him to take oral iron 1-2 tab a day for a month.  -He restarted oral iron.  -I suggested he watches his BM while on oral iron.  -his repeat iron level on 03/14/17 is 15 with saturation 5%, ferritin is normal, I gave one dose IV iron infusion, anemia has improved - his Hg is 10.1 (04/04/17) and improved to 11.5 on 05/23/17 -on 06/19/17 the patient reports he has been off iron for a few months, saturation 11%, ferritin is normal. I recommended  he restart oral iron 1  tablet daily -His repeat iron study was normal today, I would let him stop oral iron supplement.  3.  Fatigue -Likely the combination of anemia, chemotherapy and radiation and surgery. -I encouraged him to be well, and exercise -Improved.  4. Peripheral neuropathy, secondary to chemo  -mild numbness, no decreased sensation on exam, hand function is normal  -He has slightly more noticeable neuropathy, but hand functions are normal and adequate for treatment -continue monitoring closely     Plan  -He has started last cycle of Xeloda, will complete the end of this week. -F/u in 6 weeks with lab and CT CAP a few days before  -Stop oral iron.   All questions were answered. The patient knows to call the clinic with any problems, questions or concerns.  I spent 25 minutes counseling the patient face to face. The total time spent in the appointment was 30 minutes and more than 50% was on counseling.  This document serves as a record of services personally performed by Truitt Merle, MD. It was created on her behalf by Joslyn Devon, a trained medical scribe. The creation of this record is based on the scribe's personal observations and the provider's statements to them.    I have reviewed the above documentation for accuracy and completeness, and I agree with the above.   Truitt Merle, MD 08/10/2017

## 2017-08-09 NOTE — Telephone Encounter (Signed)
Patient states his appt with Dr.Feng to get clarence was resch from 12.10.18 due to weather, till tomorrow 12.14.18. Pt also wants to know about a coupon or sample of prep for colon sch for 12.19.18. Pt had ov 11.28.18.

## 2017-08-09 NOTE — Telephone Encounter (Signed)
I have left voicemail for patient that unfortunately we do not have any samples or coupons at this time for suprep. However, he can obtain a coupon online for Suprep at [email or web address]. I have advised he may call back with questions.

## 2017-08-10 ENCOUNTER — Other Ambulatory Visit (HOSPITAL_BASED_OUTPATIENT_CLINIC_OR_DEPARTMENT_OTHER): Payer: BLUE CROSS/BLUE SHIELD

## 2017-08-10 ENCOUNTER — Telehealth: Payer: Self-pay | Admitting: Hematology

## 2017-08-10 ENCOUNTER — Encounter: Payer: Self-pay | Admitting: Hematology

## 2017-08-10 ENCOUNTER — Ambulatory Visit (HOSPITAL_BASED_OUTPATIENT_CLINIC_OR_DEPARTMENT_OTHER): Payer: BLUE CROSS/BLUE SHIELD | Admitting: Hematology

## 2017-08-10 VITALS — BP 122/66 | HR 64 | Temp 97.7°F | Resp 18 | Ht 72.0 in | Wt 153.9 lb

## 2017-08-10 DIAGNOSIS — R5383 Other fatigue: Secondary | ICD-10-CM

## 2017-08-10 DIAGNOSIS — Z148 Genetic carrier of other disease: Secondary | ICD-10-CM | POA: Diagnosis not present

## 2017-08-10 DIAGNOSIS — G620 Drug-induced polyneuropathy: Secondary | ICD-10-CM

## 2017-08-10 DIAGNOSIS — C20 Malignant neoplasm of rectum: Secondary | ICD-10-CM

## 2017-08-10 DIAGNOSIS — D500 Iron deficiency anemia secondary to blood loss (chronic): Secondary | ICD-10-CM

## 2017-08-10 LAB — CBC WITH DIFFERENTIAL/PLATELET
BASO%: 0.9 % (ref 0.0–2.0)
BASOS ABS: 0 10*3/uL (ref 0.0–0.1)
EOS ABS: 0 10*3/uL (ref 0.0–0.5)
EOS%: 1.2 % (ref 0.0–7.0)
HCT: 37.1 % — ABNORMAL LOW (ref 38.4–49.9)
HEMOGLOBIN: 12.3 g/dL — AB (ref 13.0–17.1)
LYMPH%: 16.2 % (ref 14.0–49.0)
MCH: 35.1 pg — AB (ref 27.2–33.4)
MCHC: 33.2 g/dL (ref 32.0–36.0)
MCV: 106 fL — AB (ref 79.3–98.0)
MONO#: 0.6 10*3/uL (ref 0.1–0.9)
MONO%: 18.6 % — AB (ref 0.0–14.0)
NEUT#: 2.1 10*3/uL (ref 1.5–6.5)
NEUT%: 63.1 % (ref 39.0–75.0)
Platelets: 123 10*3/uL — ABNORMAL LOW (ref 140–400)
RBC: 3.5 10*6/uL — ABNORMAL LOW (ref 4.20–5.82)
RDW: 19.4 % — AB (ref 11.0–14.6)
WBC: 3.4 10*3/uL — ABNORMAL LOW (ref 4.0–10.3)
lymph#: 0.6 10*3/uL — ABNORMAL LOW (ref 0.9–3.3)

## 2017-08-10 LAB — COMPREHENSIVE METABOLIC PANEL
ALT: 38 U/L (ref 0–55)
AST: 47 U/L — AB (ref 5–34)
Albumin: 3.5 g/dL (ref 3.5–5.0)
Alkaline Phosphatase: 137 U/L (ref 40–150)
Anion Gap: 12 mEq/L — ABNORMAL HIGH (ref 3–11)
BILIRUBIN TOTAL: 1.32 mg/dL — AB (ref 0.20–1.20)
BUN: 16.5 mg/dL (ref 7.0–26.0)
CHLORIDE: 104 meq/L (ref 98–109)
CO2: 21 meq/L — AB (ref 22–29)
CREATININE: 0.8 mg/dL (ref 0.7–1.3)
Calcium: 9 mg/dL (ref 8.4–10.4)
EGFR: >60 mL/min/{1.73_m2} (ref >60)
GLUCOSE: 133 mg/dL (ref 70–140)
Potassium: 3.5 mEq/L (ref 3.5–5.1)
SODIUM: 137 meq/L (ref 136–145)
TOTAL PROTEIN: 7.1 g/dL (ref 6.4–8.3)

## 2017-08-10 LAB — IRON AND TIBC
%SAT: 25 % (ref 20–55)
Iron: 103 ug/dL (ref 42–163)
TIBC: 421 ug/dL — ABNORMAL HIGH (ref 202–409)
UIBC: 318 ug/dL (ref 117–376)

## 2017-08-10 LAB — TECHNOLOGIST REVIEW

## 2017-08-10 LAB — FERRITIN: FERRITIN: 122 ng/mL (ref 22–316)

## 2017-08-10 LAB — CEA (IN HOUSE-CHCC): CEA (CHCC-In House): 10.4 ng/mL — ABNORMAL HIGH (ref 0.00–5.00)

## 2017-08-10 NOTE — Telephone Encounter (Signed)
Scheduled appt per 12/14 los - patient Is aware of apts - my chart active did not want print out of AVS or calender .

## 2017-08-13 ENCOUNTER — Telehealth: Payer: Self-pay | Admitting: *Deleted

## 2017-08-13 NOTE — Telephone Encounter (Signed)
Per Dr. Burr Medico, NO MORE Refills on Xeloda.   Faxed response back to [facility] of Dr. Ernestina Penna instructions. [facility]   Fax      [phone]    ;     Phone     [phone]

## 2017-08-13 NOTE — Telephone Encounter (Signed)
-----   Message from Truitt Merle, MD sent at 08/12/2017 10:14 AM EST ----- Please let pt know that his iron level is good now, OK to stop oral iron.   Thanks  Truitt Merle  08/12/2017

## 2017-08-13 NOTE — Telephone Encounter (Signed)
Spoke with pt and informed pt of iron level good now.  Ok to stop oral iron as per Dr. Burr Medico.  Pt voiced understanding. However, pt wanted to know if he should be concerned of increasing CEA level.  Pt wanted to know what Dr. Burr Medico thinks of the CEA results.

## 2017-08-14 NOTE — Telephone Encounter (Signed)
I called him back and discussed his CEA, which is stable overall and not a big concern to me.   Truitt Merle MD

## 2017-08-15 ENCOUNTER — Ambulatory Visit (AMBULATORY_SURGERY_CENTER): Payer: BLUE CROSS/BLUE SHIELD | Admitting: Internal Medicine

## 2017-08-15 ENCOUNTER — Encounter: Payer: Self-pay | Admitting: Internal Medicine

## 2017-08-15 ENCOUNTER — Other Ambulatory Visit: Payer: Self-pay

## 2017-08-15 ENCOUNTER — Encounter: Payer: BLUE CROSS/BLUE SHIELD | Admitting: Internal Medicine

## 2017-08-15 VITALS — BP 113/72 | HR 51 | Temp 98.4°F | Resp 11 | Ht 72.0 in | Wt 149.0 lb

## 2017-08-15 DIAGNOSIS — Z85048 Personal history of other malignant neoplasm of rectum, rectosigmoid junction, and anus: Secondary | ICD-10-CM | POA: Diagnosis present

## 2017-08-15 DIAGNOSIS — C20 Malignant neoplasm of rectum: Secondary | ICD-10-CM

## 2017-08-15 MED ORDER — SODIUM CHLORIDE 0.9 % IV SOLN: 500.0000 mL | Freq: Once | INTRAVENOUS | Status: DC

## 2017-08-15 NOTE — Patient Instructions (Signed)
YOU HAD AN ENDOSCOPIC PROCEDURE TODAY AT [facility]:   Refer to the procedure report that was given to you for any specific questions about what was found during the examination.  If the procedure report does not answer your questions, please call your gastroenterologist to clarify.  If you requested that your care partner not be given the details of your procedure findings, then the procedure report has been included in a sealed envelope for you to review at your convenience later.  YOU SHOULD EXPECT: Some feelings of bloating in the abdomen. Passage of more gas than usual.  Walking can help get rid of the air that was put into your GI tract during the procedure and reduce the bloating. If you had a lower endoscopy (such as a colonoscopy or flexible sigmoidoscopy) you may notice spotting of blood in your stool or on the toilet paper. If you underwent a bowel prep for your procedure, you may not have a normal bowel movement for a few days.  Please Note:  You might notice some irritation and congestion in your nose or some drainage.  This is from the oxygen used during your procedure.  There is no need for concern and it should clear up in a day or so.  SYMPTOMS TO REPORT IMMEDIATELY:   Following lower endoscopy (colonoscopy or flexible sigmoidoscopy):  Excessive amounts of blood in the stool  Significant tenderness or worsening of abdominal pains  Swelling of the abdomen that is new, acute  Fever of 100F or higher    For urgent or emergent issues, a gastroenterologist can be reached at any hour by calling [phone].   DIET:  We do recommend a small meal at first, but then you may proceed to your regular diet.  Drink plenty of fluids but you should avoid alcoholic beverages for 24 hours.  ACTIVITY:  You should plan to take it easy for the rest of today and you should NOT DRIVE or use heavy machinery until tomorrow (because of the sedation medicines used during the test).     FOLLOW UP: Our staff will call the number listed on your records the next business day following your procedure to check on you and address any questions or concerns that you may have regarding the information given to you following your procedure. If we do not reach you, we will leave a message.  However, if you are feeling well and you are not experiencing any problems, there is no need to return our call.  We will assume that you have returned to your regular daily activities without incident.  If any biopsies were taken you will be contacted by phone or by letter within the next 1-3 weeks.  Please call us at [phone] if you have not heard about the biopsies in 3 weeks.    SIGNATURES/CONFIDENTIALITY: You and/or your care partner have signed paperwork which will be entered into your electronic medical record.  These signatures attest to the fact that that the information above on your After Visit Summary has been reviewed and is understood.  Full responsibility of the confidentiality of this discharge information lies with you and/or your care-partner.  Follow up colonoscopy 3 years-2021

## 2017-08-15 NOTE — Progress Notes (Signed)
  [facility] Post-op Note  Patient: David Clarke  Procedure(s) Performed: colonoscopy  Patient Location: [facility] - Recovery Area  Anesthesia Type: Deep Sedation/Propofol  Level of Consciousness: awake, oriented and patient cooperative  Airway and Oxygen Therapy: Patient Spontanous Breathing  Post-op Pain: none  Post-op Assessment:  Post-op Vital signs reviewed, Patient's Cardiovascular Status Stable, Respiratory Function Stable, Patent Airway, No signs of Nausea or vomiting and Pain level controlled  Post-op Vital Signs: Reviewed and stable  Complications: No apparent anesthesia complications  Adabella Stanis E Yong Grieser 10:47 AM

## 2017-08-15 NOTE — Op Note (Signed)
[facility] Patient Name: David Clarke Procedure Date: 08/15/2017 10:15 AM MRN: [id number] Endoscopist: Jerene Bears , MD Age: 61 Referring MD:  Date of Birth: 10/05/1955 Gender: Male Account #: [id number] Procedure:                Colonoscopy Indications:              High risk colon cancer surveillance: Personal                            history of rectal cancer with incomplete                            colonoscopy due to obstructing tumor in Feb 2018;                            first full colonoscopy Medicines:                Monitored Anesthesia Care Procedure:                Pre-Anesthesia Assessment:                           - Prior to the procedure, a History and Physical                            was performed, and patient medications and                            allergies were reviewed. The patient's tolerance of                            previous anesthesia was also reviewed. The risks                            and benefits of the procedure and the sedation                            options and risks were discussed with the patient.                            All questions were answered, and informed consent                            was obtained. Prior Anticoagulants: The patient has                            taken no previous anticoagulant or antiplatelet                            agents. ASA Grade Assessment: III - A patient with                            severe systemic disease. After reviewing the risks  and benefits, the patient was deemed in                            satisfactory condition to undergo the procedure.                           After obtaining informed consent, the colonoscope                            was passed under direct vision. Throughout the                            procedure, the patient's blood pressure, pulse, and                            oxygen saturations were monitored continuously. The                            Colonoscope was introduced through the sigmoid                            colostomy and advanced to the the cecum, identified                            by appendiceal orifice and ileocecal valve. The                            colonoscopy was performed without difficulty. The                            patient tolerated the procedure well. The quality                            of the bowel preparation was good. The ileocecal                            valve and the appendiceal orifice were photographed. Scope In: 10:29:27 AM Scope Out: 10:41:34 AM Scope Withdrawal Time: 0 hours 7 minutes 41 seconds  Total Procedure Duration: 0 hours 12 minutes 7 seconds  Findings:                 There was evidence of a patent end colostomy in the                            sigmoid colon. This was characterized by healthy                            appearing mucosa.                           The colon (entire examined portion) appeared normal. Complications:            No immediate complications. Estimated Blood Loss:     Estimated blood loss: none. 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                           - The entire examined colon is normal.                           - No specimens collected. Recommendation:           - Patient has a contact number available for                            emergencies. The signs and symptoms of potential                            delayed complications were discussed with the                            patient. Return to normal activities tomorrow.                            Written discharge instructions were provided to the                            patient.                           - Resume previous diet.                           - Continue present medications.                           - Repeat colonoscopy in 3 years for surveillance. Jerene Bears, MD 08/15/2017 10:48:23 AM This report has been signed electronically. CC Letter to:             Truitt Merle

## 2017-08-16 ENCOUNTER — Telehealth: Payer: Self-pay

## 2017-08-16 NOTE — Telephone Encounter (Signed)
  Follow up Call-  Call back number 08/15/2017 10/09/2016  Post procedure Call Back phone  # [phone] cell  Permission to leave phone message Yes Yes  Some recent data might be hidden     Left message

## 2017-08-16 NOTE — Telephone Encounter (Signed)
Patient calling back and states he is doing great.

## 2017-08-16 NOTE — Telephone Encounter (Signed)
  Follow up Call-  Call back number 08/15/2017 10/09/2016  Post procedure Call Back phone  # [phone] cell  Permission to leave phone message Yes Yes  Some recent data might be hidden     Patient questions:  Do you have a fever, pain , or abdominal swelling? No. Pain Score  0 *  Have you tolerated food without any problems? Yes.    Have you been able to return to your normal activities? Yes.    Do you have any questions about your discharge instructions: Diet   No. Medications  No. Follow up visit  No.  Do you have questions or concerns about your Care? No.  Actions: * If pain score is 4 or above: No action needed, pain <4.

## 2017-09-18 ENCOUNTER — Ambulatory Visit (HOSPITAL_COMMUNITY)
Admission: RE | Admit: 2017-09-18 | Discharge: 2017-09-18 | Disposition: A | Discharge status: Home / Self Care | Payer: BLUE CROSS/BLUE SHIELD | Source: Ambulatory Visit | Attending: Hematology | Admitting: Hematology

## 2017-09-18 ENCOUNTER — Inpatient Hospital Stay: Payer: BLUE CROSS/BLUE SHIELD | Attending: Hematology

## 2017-09-18 ENCOUNTER — Encounter (HOSPITAL_COMMUNITY): Payer: Self-pay

## 2017-09-18 DIAGNOSIS — D500 Iron deficiency anemia secondary to blood loss (chronic): Secondary | ICD-10-CM | POA: Diagnosis present

## 2017-09-18 DIAGNOSIS — K4020 Bilateral inguinal hernia, without obstruction or gangrene, not specified as recurrent: Secondary | ICD-10-CM | POA: Insufficient documentation

## 2017-09-18 DIAGNOSIS — C20 Malignant neoplasm of rectum: Secondary | ICD-10-CM | POA: Insufficient documentation

## 2017-09-18 DIAGNOSIS — R911 Solitary pulmonary nodule: Secondary | ICD-10-CM | POA: Diagnosis not present

## 2017-09-18 DIAGNOSIS — Z933 Colostomy status: Secondary | ICD-10-CM | POA: Insufficient documentation

## 2017-09-18 DIAGNOSIS — N3090 Cystitis, unspecified without hematuria: Secondary | ICD-10-CM | POA: Diagnosis not present

## 2017-09-18 DIAGNOSIS — R590 Localized enlarged lymph nodes: Secondary | ICD-10-CM | POA: Insufficient documentation

## 2017-09-18 LAB — CBC WITH DIFFERENTIAL/PLATELET
BASOS ABS: 0 10*3/uL (ref 0.0–0.1)
Basophils Relative: 1 %
EOS ABS: 0.1 10*3/uL (ref 0.0–0.5)
EOS PCT: 1 %
HCT: 42.8 % (ref 38.4–49.9)
Hemoglobin: 14.4 g/dL (ref 13.0–17.1)
LYMPHS PCT: 17 %
Lymphs Abs: 1 10*3/uL (ref 0.9–3.3)
MCH: 35 pg — ABNORMAL HIGH (ref 27.2–33.4)
MCHC: 33.6 g/dL (ref 32.0–36.0)
MCV: 104.1 fL — ABNORMAL HIGH (ref 79.3–98.0)
MONO ABS: 0.6 10*3/uL (ref 0.1–0.9)
Monocytes Relative: 10 %
Neutro Abs: 4 10*3/uL (ref 1.5–6.5)
Neutrophils Relative %: 71 %
PLATELETS: 232 10*3/uL (ref 140–400)
RBC: 4.11 MIL/uL — AB (ref 4.20–5.82)
RDW: 15 % (ref 11.0–15.6)
WBC: 5.7 10*3/uL (ref 4.0–10.3)

## 2017-09-18 LAB — COMPREHENSIVE METABOLIC PANEL
ALBUMIN: 3.8 g/dL (ref 3.5–5.0)
ALK PHOS: 140 U/L (ref 40–150)
ALT: 29 U/L (ref 0–55)
AST: 34 U/L (ref 5–34)
Anion gap: 8 (ref 3–11)
BUN: 13 mg/dL (ref 7–26)
CALCIUM: 9.4 mg/dL (ref 8.4–10.4)
CO2: 27 mmol/L (ref 22–29)
CREATININE: 0.85 mg/dL (ref 0.70–1.30)
Chloride: 104 mmol/L (ref 98–109)
GFR calc Af Amer: >60 mL/min (ref >60)
GLUCOSE: 95 mg/dL (ref 70–140)
Potassium: 4.2 mmol/L (ref 3.5–5.1)
Sodium: 139 mmol/L (ref 136–145)
TOTAL PROTEIN: 7.8 g/dL (ref 6.4–8.3)
Total Bilirubin: 0.6 mg/dL (ref 0.2–1.2)

## 2017-09-18 LAB — FERRITIN: Ferritin: 48 ng/mL (ref 22–316)

## 2017-09-18 LAB — CEA (IN HOUSE-CHCC): CEA (CHCC-IN HOUSE): 8.31 ng/mL — AB (ref 0.00–5.00)

## 2017-09-18 LAB — IRON AND TIBC
IRON: 83 ug/dL (ref 42–163)
Saturation Ratios: 22 % — ABNORMAL LOW (ref 42–163)
TIBC: 376 ug/dL (ref 202–409)
UIBC: 293 ug/dL

## 2017-09-18 MED ORDER — IOPAMIDOL (ISOVUE-300) INJECTION 61%
100.0000 mL | Freq: Once | INTRAVENOUS | Status: AC | PRN
  Administered 2017-09-18: 100 mL via INTRAVENOUS

## 2017-09-18 MED ORDER — IOPAMIDOL (ISOVUE-300) INJECTION 61%
INTRAVENOUS | Status: AC
  Filled 2017-09-18: qty 100

## 2017-09-18 NOTE — Progress Notes (Signed)
[name]  [phone] [phone]  Clinic Follow Up Note   Patient Care Team: Robyn Haber, MD as PCP - General (Family Medicine) Michael Boston, MD as Consulting Physician (General Surgery) Pyrtle, Lajuan Lines, MD as Consulting Physician (Gastroenterology) Truitt Merle, MD as Consulting Physician (Hematology and Oncology) Kyung Rudd, MD as Consulting Physician (Radiation Oncology)   Date of Service:  09/20/2017  CHIEF COMPLAINTS:  Follow up rectal adenocarcinoma  Oncology History   Cancer Staging Rectal adenocarcinoma  Staging form: Colon and Rectum, AJCC 8th Edition - Clinical stage from 10/09/2016: Stage IIIB (cT3, cN1b, cM0) - Signed by Truitt Merle, MD on 10/25/2016       Encounter for antineoplastic chemotherapy   10/03/2016 - 10/03/2016 Hospital Admission    Admit date: 10/03/16 The patient presented to the [facility] ED with rectal bleeding and difficulty with hygiene.      10/09/2016 Initial Diagnosis    Rectal adenocarcinoma       10/09/2016 Procedure    Colonoscopy showed a fungating, infiltrative and ulcerated partially obstructing large mass in the distal rectum, measuring 9 cm, prolapsing at anal canal. Scope not advanced proximal to the rectosigmoid colon.      10/09/2016 Initial Biopsy    Rectal mass biopsy showed invasive adenocarcinoma.      10/13/2016 Imaging    CT Chest Abdomen Pelvis w contrast IMPRESSION: Large irregular enhancing rectal mass consistent with known rectal cancer. There are small perirectal and small pelvic sidewall lymph nodes which are suspicious. Prominent perirectal vessels are also noted. No evidence of metastatic retroperitoneal or sigmoid mesocolon adenopathy or hepatic or pulmonary metastasis.      10/24/2016 Imaging    MRI Pelvis IMPRESSION: 1. Moderately motion degraded exam. 2. Large mass is centered at the anus with extension into the low rectum. No complicating obstruction. 3. Mild  mesorectal adenopathy, suspicious. 4. Right inguinal hernia containing nonobstructive small bowel.       11/08/2016 - 12/19/2016 Chemotherapy    Xeloda 50 mg twice daily, with concurrent radiation, held since 12/03/2016 due to colitis and hospitalization      11/08/2016 - 12/19/2016 Radiation Therapy    Neoadjuvant radiation to his rectal cancer  1) Pelvis/ 45 Gy in 25 fractions  2) Rectum boost/ 9 Gy in 5 fractions Under the care of Dr. Lisbeth Renshaw.      12/03/2016 - 12/09/2016 Hospital Admission    He presented with few days to one week history of diarrhea with 6-7 loose stools daily, along with a fever of 101.5 on the day of admission. Patient was also noted to be hypokalemic, hyponatremic. Hospitalized for further management. Stool studies were sent. C. difficile was negative. GI pathogen panel was positive for Yersinia.  XELODA was held at this time, until infection has cleared.       12/07/2016 Imaging    CT A/P W Contrast  IMPRESSION: 1. Abnormal dilatation of the proximal small bowel loops worrisome for either small bowel obstruction versus small bowel ileus. 2. 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 3. Large right inguinal hernia containing edematous loop of distal small bowel 4. No pneumatosis or bowel perforation.  No abscess. 5. Aortic atherosclerosis 6. Mild decrease in size of rectal mass. No change in right common iliac adenopathy.      02/21/2017 Surgery    XI ROBOT ABDOMINOPERINEAL RESECTION WITH COLOSTOMY by Dr. Johney Maine  02/21/2017 Pathology Results    Diagnosis 02/21/17 1. Colon, segmental resection for tumor, rectosigmoid - INVASIVE ADENOCARCINOMA, MODERATELY DIFFERENTIATED, SPANNING 3.9 CM. - TUMOR INVADES THROUGH MUSCULARIS PROPRIA. - RESECTION MARGINS ARE NEGATIVE. - EIGHT OF EIGHT LYMPH NODES NEGATIVE FOR CARCINOMA (0/8), SEE COMMENT. - SEE ONCOLOGY TABLE. 2.  Soft tissue, biopsy, left lateral pelvic floor - BENIGN SKELETAL MUSCLE AND SOFT TISSUE. - NO MALIGNANCY IDENTIFIED. 3. Colon, segmental resection, proximal sigmoid - BENIGN COLONIC MUCOSA. - NO DYSPLASIA OR MALIGNANCY. 4. Liver, biopsy, liver mass - BENIGN VASCULAR PROLIFERATION CONSISTENT WITH HEMANGIOMA. - NO MALIGNANCY IDENTIFIED.      04/04/2017 - 08/12/2017 Chemotherapy    CAPOX every 3 weeks for 6 cycles. I decreased his first cycle of oxaliplatin platinum by 15%, he tolerated well, so we increased to full dose from cycle 2. He completed 5 cycles of oxaliplatin on 07/16/17 and completed Xeloda on 08/12/17.        08/15/2017 Procedure    Colonoscopy 08/15/17 IMPRESSION - Patent end colostomy with healthy appearing mucosa in the sigmoid colon. - The entire examined colon is normal. - No specimens collected.      09/18/2017 Imaging    CT CAP W Contrast 09/18/17 IMPRESSION: Interval abdomino-peritoneal resection with left lower quadrant colostomy. Increased diffuse cystitis, likely due to radiation. New presacral soft tissue density with central low attenuation, also suspicious for post treatment changes. Recommend continued attention on follow-up imaging. New mild retroperitoneal lymphadenopathy in the aortocaval space, measuring up to 1.8 cm. This is suspicious for metastatic disease, although it is conceivable that this could be reactive in etiology due to interval surgery and radiation therapy. Recommend short-term follow-up by CT in 3 months. New indeterminate 4 mm left upper lobe pulmonary nodule, likely inflammatory in etiology with metastatic disease considered less likely. Recommend continued attention on follow-up CT. Bilateral inguinal hernias, largest on the right containing several small bowel loops.      HISTORY OF PRESENTING ILLNESS (10/25/16):  David Clarke 62 y.o. male is here because of his recently diagnosed rectal adenocarcinoma. He was referred by  his gastroenterologist title parietal. Patient presents to my clinic, accompanied by his wife and his daughter.       He has noticed a anal mass accompanied by intermittent bleeding and pain in the rectum for the past 5 months. The patient initially presented to the [facility] ED on 10/03/16 with rectal bleeding and difficulty with hygiene. He was without health insurance previously and so did not pursue evaluation or treatment until this time.  The patient was seen by Dr. Hilarie Fredrickson for a colonoscopy and biopsy on 10/09/16, revealing a large partially obstructing low rectal mass, prolapsing to anal verge. Biopsy showed adenocarcinoma. CT C/A/P on 10/13/16 revealed a large, irregular, enhancing mass consistent with known rectal cancer. There were several suspicious small perirectal and small pelvic sidewall lymph nodes. Prominent perirectal vessels were also noted. MRI of the pelvis on 10/24/16 showed a large mass centered at the anus with extension into the low rectum without obstruction. There was suspicious mild mesorectal adenopathy, and a right inguinal hernia containing nonobstructive small bowel.   The patient comes to the clinic for consultation today accompanied by his partner and daughter. He denies abdominal cramps, nausea, bowel or bladder concerns. He reports taking OTC stool softener as needed. He reports his appetite is good, but he has lost 12-15 lbs in the past few weeks. His daughter notes she has hemochromatosis and questions if this could be playing a role  in the patient's anemia.  CURRENT THERAPY:  Observation   INTERIM HISTORY:  David Clarke presents for follow up. He presents to the clinic today with his wife. His daughter is present via facetime.  Of note, since his last visit he underwent a colonoscopy by Dr. Hilarie Fredrickson on 08/15/17 that was normal. He Also had a CT CAP on 09/18/17.   Today he notes he is doing well overall.  On review of symptoms, pt notes mire tingling and mild numbness in  his feet and fingers. This comes and goes so it is overall improving. He will get edema when he is on his feet for a long period of time.    MEDICAL HISTORY:  Past Medical History:  Diagnosis Date  . Allergy   . Cancer ([place]) 10/09/2016   rectal adenocarcinoma  . History of hiatal hernia     SURGICAL HISTORY: Past Surgical History:  Procedure Laterality Date  . COLONOSCOPY W/ BIOPSIES Bilateral 10/09/2016   rectal adenocarcinoma  . MOUTH SURGERY     age 81, to remove extra "eye" teeth  . [place] teeth extraction    . XI ROBOT ABDOMINAL PERINEAL RESECTION N/A 02/21/2017   Procedure: XI ROBOT ABDOMINOPERINEAL RESECTION WITH COLOSTOMY;  Surgeon: Michael Boston, MD;  Location: [facility];  Service: General;  Laterality: N/A;    SOCIAL HISTORY: Social History   Socioeconomic History  . Marital status: Single    Spouse name: Not on file  . Number of children: 1  . Years of education: Not on file  . Highest education level: Not on file  Social Needs  . Financial resource strain: Not on file  . Food insecurity - worry: Not on file  . Food insecurity - inability: Not on file  . Transportation needs - medical: Not on file  . Transportation needs - non-medical: Not on file  Occupational History  . Occupation: Architectural technologist  Tobacco Use  . Smoking status: Former Smoker    Years: 2.00    Types: Cigarettes    Last attempt to quit: 2002    Years since quitting: 17.0  . Smokeless tobacco: Never Used  Substance and Sexual Activity  . Alcohol use: Yes    Alcohol/week: 6.0 oz    Types: 4 Glasses of wine, 6 Cans of beer per week    Comment: beer or a glass or wine most days of the week  . Drug use: No  . Sexual activity: Yes    Partners: Female  Other Topics Concern  . Not on file  Social History Narrative  . Not on file    FAMILY HISTORY: Family History  Problem Relation Age of Onset  . Other Mother        bile duct tumor  . Stroke Mother   . Cancer Mother 33       bile  duct cancer   . Prostate cancer Father 59  . Emphysema Father   . Colon cancer Neg Hx   . Esophageal cancer Neg Hx   . Pancreatic cancer Neg Hx   . Rectal cancer Neg Hx   . Stomach cancer Neg Hx     ALLERGIES:  is allergic to sulfa antibiotics.  MEDICATIONS:  Current Outpatient Medications  Medication Sig Dispense Refill  . capecitabine (XELODA) 500 MG tablet Take 4 tab in the morning and 3 tab in the evening, on days 1-14 of chemotherapy every 21 days. 98 tablet 1  . IRON, FERROUS SULFATE, PO Take 65 mg daily by mouth.    Marland Kitchen  Multiple Vitamin (MULTIVITAMIN WITH MINERALS) TABS tablet Take 1 tablet by mouth 3 (three) times a week.    . ondansetron (ZOFRAN) 8 MG tablet Take 1 tablet (8 mg total) by mouth 2 (two) times daily as needed for refractory nausea / vomiting. Start on day 3 after chemotherapy. 30 tablet 1  . prochlorperazine (COMPAZINE) 10 MG tablet Take 1 tablet (10 mg total) by mouth every 6 (six) hours as needed (Nausea or vomiting). 30 tablet 1   Current Facility-Administered Medications  Medication Dose Route Frequency Provider Last Rate Last Dose  . 0.9 %  sodium chloride infusion  500 mL Intravenous Continuous Pyrtle, Lajuan Lines, MD      . 0.9 %  sodium chloride infusion  500 mL Intravenous Once Pyrtle, Lajuan Lines, MD        REVIEW OF SYSTEMS:   Constitutional: Denies fevers, chills or abnormal night sweats (+) weight gain  Eyes: Denies blurriness of vision, double vision or watery eyes Ears, nose, mouth, throat, and face: Denies mucositis or sore throat   Respiratory: Denies cough, dyspnea or wheezes Cardiovascular: Denies palpitation, chest discomfort or (+) bilateral feet swelling when on feet for long periods of time.  Gastrointestinal:  Denies nausea, heartburn or change in bowel habits (+) colostomy bag  Skin: Negative Lymphatics: Denies new lymphadenopathy or easy bruising MSK: (+) Hand and wrist joint sensitivity  Neurological: (+) improved neuropathy (+) occasional mild  feet numbness Behavioral/Psych: Mood is stable, no new changes  All other systems were reviewed with the patient and are negative.   PHYSICAL EXAMINATION: ECOG PERFORMANCE STATUS: 1 - Symptomatic but completely ambulatory  Vitals:   09/20/17 1121  BP: (!) 147/86  Pulse: 73  Resp: 17  Temp: 98 F (36.7 C)  SpO2: 98%   Filed Weights   09/20/17 1121  Weight: 160 lb (72.6 kg)      GENERAL: alert, no distress and comfortable.  HEENT: (+) thrush resolved SKIN: skin color, texture, turgor are normal, no rashes or significant lesions NECK: supple, thyroid normal size, non-tender, without nodularity LYMPH: no palpable lymphadenopathy in the cervical, axillary or inguinal LUNGS: clear to auscultation and percussion with normal breathing effort HEART: regular rate & rhythm and no murmurs and no lower extremity edema ABDOMEN: abdomen soft, non-tender and normal bowel sounds (+) colostomy bag in left Upper Quadrant with pink liquid and no stool (+) laparoscopic incisions all healed Musculoskeletal: no cyanosis of digits and no clubbing  PSYCH: alert & oriented x 3 with fluent speech NEURO: no focal motor/sensory deficits.  RECTAL:  (+) reconstruction has healed without discharge or skin redness.    LABORATORY DATA:  I have reviewed the data as listed CBC Latest Ref Rng & Units 09/18/2017 08/10/2017 07/16/2017  WBC 4.0 - 10.3 K/uL 5.7 3.4(L) 4.1  Hemoglobin 13.0 - 17.1 g/dL 14.4 12.3(L) 12.5(L)  Hematocrit 38.4 - 49.9 % 42.8 37.1(L) 37.8(L)  Platelets 140 - 400 K/uL 232 123(L) 160   CMP Latest Ref Rng & Units 09/18/2017 08/10/2017 07/16/2017  Glucose 70 - 140 mg/dL 95 133 133  BUN 7 - 26 mg/dL 13 16.5 12.0  Creatinine 0.70 - 1.30 mg/dL 0.85 0.8 0.8  Sodium 136 - 145 mmol/L 139 137 137  Potassium 3.5 - 5.1 mmol/L 4.2 3.5 3.6  Chloride 98 - 109 mmol/L 104 - -  CO2 22 - 29 mmol/L 27 21(L) 22  Calcium 8.4 - 10.4 mg/dL 9.4 9.0 9.0  Total Protein 6.4 - 8.3 g/dL 7.8 7.1 7.5  Total  Bilirubin 0.2 - 1.2 mg/dL 0.6 1.32(H) 0.66  Alkaline Phos 40 - 150 U/L 140 137 141  AST 5 - 34 U/L 34 47(H) 42(H)  ALT 0 - 55 U/L 29 38 39     CEA ([facility])  10/27/2016: 7.81 11/27/2016: 5.56 01/10/2017: 5.61 03/14/17: 5.11 04/11/17: 5.87 05/23/17: 8.79 06/19/17:  9.15 07/16/17:  9.88 08/10/17: 10.40 09/18/17: 8.31   PATHOLOGY:   Diagnosis 02/21/17 1. Colon, segmental resection for tumor, rectosigmoid - INVASIVE ADENOCARCINOMA, MODERATELY DIFFERENTIATED, SPANNING 3.9 CM. - TUMOR INVADES THROUGH MUSCULARIS PROPRIA. - RESECTION MARGINS ARE NEGATIVE. - EIGHT OF EIGHT LYMPH NODES NEGATIVE FOR CARCINOMA (0/8), SEE COMMENT. - SEE ONCOLOGY TABLE. 2. Soft tissue, biopsy, left lateral pelvic floor - BENIGN SKELETAL MUSCLE AND SOFT TISSUE. - NO MALIGNANCY IDENTIFIED. 3. Colon, segmental resection, proximal sigmoid - BENIGN COLONIC MUCOSA. - NO DYSPLASIA OR MALIGNANCY. 4. Liver, biopsy, liver mass - BENIGN VASCULAR PROLIFERATION CONSISTENT WITH HEMANGIOMA. - NO MALIGNANCY IDENTIFIED. Microscopic Comment 1. COLON AND RECTUM (INCLUDING TRANS-ANAL RESECTION): Specimen: Rectosigmoid colon with anus. Procedure: Abdominoperineal resection. Tumor site: Distal 1/3 rectum to anus. Specimen integrity: Intact. Macroscopic intactness of mesorectum: Incomplete. Macroscopic tumor perforation: Not identified. Invasive tumor: Maximum size: 3.9 cm Histologic type(s): Invasive adenocarcinoma. Histologic grade and differentiation: G2: moderately differentiated/low grade Type of polyp in which invasive carcinoma arose: N/A. Microscopic extension of invasive tumor: Tumor invades through muscularis propria. Lymph-Vascular invasion: Not identified. 1 of 3 FINAL for David Clarke, David Clarke [phone]) Microscopic Comment(continued) Peri-neural invasion: Present. Tumor deposit(s) (discontinuous extramural extension): Not identified. Resection margins: Proximal margin: Negative. Distal margin:  Negative. Circumferential (radial) (posterior ascending, posterior descending; lateral and posterior mid-rectum; and entire lower 1/3 rectum): Negative. Mesenteric margin (sigmoid and transverse): Negative. Distance closest margin (if all above margins negative): <0.1 cm radial margin. >1 cm distal margin. Treatment effect (neo-adjuvant therapy): Present Additional polyp(s): N/A. Non-neoplastic findings: None. Lymph nodes: number examined 8; number positive: 0 (treatment effect present). Pathologic Staging: ypT3, ypN0, ypMX Ancillary studies: Can be performed upon request. Comment: The fat was cleared and additional lymph nodes were not identified.  Diagnosis 10/09/2016 Rectum, biopsy, mass - ADENOCARCINOMA.  RADIOGRAPHIC STUDIES: I have personally reviewed the radiological images as listed and agreed with the findings in the report.   CT CAP W Contrast 09/18/17 IMPRESSION: Interval abdomino-peritoneal resection with left lower quadrant colostomy. Increased diffuse cystitis, likely due to radiation. New presacral soft tissue density with central low attenuation, also suspicious for post treatment changes. Recommend continued attention on follow-up imaging. New mild retroperitoneal lymphadenopathy in the aortocaval space, measuring up to 1.8 cm. This is suspicious for metastatic disease, although it is conceivable that this could be reactive in etiology due to interval surgery and radiation therapy. Recommend short-term follow-up by CT in 3 months. New indeterminate 4 mm left upper lobe pulmonary nodule, likely inflammatory in etiology with metastatic disease considered less likely. Recommend continued attention on follow-up CT. Bilateral inguinal hernias, largest on the right containing several small bowel loops.   Colonoscopy 08/15/17 IMPRESSION - Patent end colostomy with healthy appearing mucosa in the sigmoid colon. - The entire examined colon is normal. - No  specimens collected.  MRI Pelvis W WO Contrast 01/29/17 IMPRESSION: Significant decrease in size of soft tissue mass at the anorectal junction, with post radiation changes. No residual perirectal lymphadenopathy or other signs of pelvic metastatic disease. Stable small right inguinal hernia containing small bowel.  CT A/P W Contrast 12/07/16 IMPRESSION: 1. Abnormal dilatation of the proximal small bowel loops  worrisome for either small bowel obstruction versus small bowel ileus. 2. 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 3. Large right inguinal hernia containing edematous loop of distal small bowel 4. No pneumatosis or bowel perforation.  No abscess. 5. Aortic atherosclerosis 6. Mild decrease in size of rectal mass. No change in right common iliac adenopathy.  DG Chest 2 View 12/03/2016 IMPRESSION: 1. No active cardiopulmonary disease. No evidence of pneumonia or pulmonary edema. No evidence of intrathoracic metastasis. 2. Hyperexpanded lungs suggesting COPD.  MRI Pelvis 10/24/2016 IMPRESSION: 1. Moderately motion degraded exam. 2. Large mass is centered at the anus with extension into the low rectum. No complicating obstruction. 3. Mild mesorectal adenopathy, suspicious. 4. Right inguinal hernia containing nonobstructive small bowel.  CT CAP w Contrast 10/13/2016 IMPRESSION: Large irregular enhancing rectal mass consistent with known rectal cancer. There are small perirectal and small pelvic sidewall lymph nodes which are suspicious. Prominent perirectal vessels are also noted. No evidence of metastatic retroperitoneal or sigmoid mesocolon adenopathy or hepatic or pulmonary metastasis.  COLONOSCOPY 10/09/2016 Dr. Hilarie Fredrickson  A fungating, infiltrative and ulcerated partially obstructing large mass was found in the distal rectum. The mass was circumferential. The mass measured seven  cm in length (prolapsing and from dentate line to approx 7 cm). In addition, its inner diameter measured nine mm. Oozing was present. This was biopsied with a cold forceps for histology. - Due to the distal rectal mass the bowel preparation was poor and unable to be cleared via the adult upper endoscope (adult upper endoscope used due to obstructing mass). Scope not advanced proximal to the rectosigmoid colon.  ASSESSMENT & PLAN: 62 y.o.  gentleman, previously healthy, presented with a anal mass and a mild intermittent rectal bleeding for 5-6 months. Fatigue, anorexia, fatigue and weight loss for a month. He is currently on radiation for his rectal cancer, chemotherapy has been held lately.  1. Rectal Adenocarcinoma, low rectum, cT3N1bM0, ypT3N0, stage IIIB, MSI-stable  -I previously reviewed his CT and MRI scan, colonoscopy and biopsy findings in detail with pt and his family members -Based on his imaging findings, he has clinical stage III disease. We reviewed his image in our GI tumor Board, he also has a small liver lesion which is indeterminate, likely benign, and we will follow up with a repeated CT in about 3 months.  -patient received neoadjuvant chemotherapy and radiation with Xeloda, which was held for the last few weeks due to infectious colitis. -He underwent APR on 02/21/2017, surgical margins were negative, all lymph nodes were negative. We've reviewed his surgical pathology findings he did have significant treatment effect on his surgical sample.  - We discussed the role of adjuvant chemotherapy. Given his stage III B disease, I recommend adjuvant FOLFOX or CAPOX. The patient opted CAPOX, which is every 3 weeks, for total 6 cycles.  -He recovered well from surgery, started adjuvant CAPOX 04/04/17. I decreased his first cycle of oxaliplatin platinum by 15%, he tolerated well, so we increased to full dose from cycle 2. He completed 5 cycles of oxaliplatin on 07/16/17 and completed Xeloda on  08/12/17. His oxaliplatin treatment was postponed a few times due to his working schedule -We discussed his risk of cancer recurrence again, and we reviewed cancer surveillance plan. -He had repeated colonoscopy by Dr. Hilarie Fredrickson in 07/2017 which was negative  -We discussed his CT CAP from 09/18/17 which shows multiple slightly enlarged RP lymph nodes  and a 4 mm lung nodule in the left lower lobe. Otherwise there is surgical change with no malignant evidence in his liver or lungs. The enlargement of lymph nodes is possible he reactive, or metastatic recurrent colon cancer.  He is clinically doing well, asymmetric, lab reviewed CEA has been slightly elevated since his surgery, slightly trending down today.  I suggest getting a PET scan in 4 weeks.  -Depending on PET scan he may need a lymph node biopsy of it's growing or PET positive.  -Labs reviewed and overall improved. Hg normal at 14.4, WBC normal and CMP WNL. CEA trending down, now at 8.31.  -F/u in 4 weeks after PET scan.   2. 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 -Hemoglobin was 8.3 initially with low MCV, likely anemia of iron deficiency from chronic blood loss. This was confirmed by his iron study. 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 -previously repeated iron study still showed low iron, his anemia has gotten worse lately, I previously recommend him to take oral iron 1-2 tab a day for a month.  -He restarted oral iron.  -I suggested he watches his BM while on oral iron.  -his repeat iron level on 03/14/17 is 15 with saturation 5%, ferritin is normal, I  gave one dose IV iron infusion, anemia has improved - his Hg is 10.1 (04/04/17) and improved to 11.5 on 05/23/17 -on 06/19/17 the patient reports he has been off iron for a few months, saturation 11%, ferritin is normal. I recommended he restart oral iron 1 tablet daily -His repeat iron study was normal in 07/2017, I would let him stop oral iron supplement. -currently resolved   3.  Fatigue -Likely the combination of anemia, chemotherapy and radiation and surgery. -I encouraged him to be well, and exercise -much Improved.  4. Peripheral neuropathy, secondary to chemo  -mild numbness, no decreased sensation on exam, hand function is normal  -He has slightly more noticeable neuropathy, but hand functions are normal and adequate for treatment -continue monitoring closely  -He takes B complex and his neuropathy has improved.     Plan  -lab and scan reviewed.  -F/u on 2/22 with PET scan a few days before    All questions were answered. The patient knows to call the clinic with any problems, questions or concerns.  I spent 25 minutes counseling the patient face to face. The total time spent in the appointment was 30 minutes and more than 50% was on counseling.  This document serves as a record of services personally performed by Truitt Merle, MD. It was created on her behalf by Joslyn Devon, a trained medical scribe. The creation of this record is based on the scribe's personal observations and the provider's statements to them.    I have reviewed the above documentation for accuracy and completeness, and I agree with the above.    Truitt Merle, MD 09/20/2017

## 2017-09-19 ENCOUNTER — Other Ambulatory Visit: Payer: Self-pay | Admitting: Hematology

## 2017-09-19 DIAGNOSIS — C20 Malignant neoplasm of rectum: Secondary | ICD-10-CM

## 2017-09-20 ENCOUNTER — Encounter: Payer: Self-pay | Admitting: Hematology

## 2017-09-20 ENCOUNTER — Inpatient Hospital Stay (HOSPITAL_BASED_OUTPATIENT_CLINIC_OR_DEPARTMENT_OTHER): Payer: BLUE CROSS/BLUE SHIELD | Admitting: Hematology

## 2017-09-20 ENCOUNTER — Telehealth: Payer: Self-pay | Admitting: Hematology

## 2017-09-20 VITALS — BP 147/86 | HR 73 | Temp 98.0°F | Resp 17 | Ht 72.0 in | Wt 160.0 lb

## 2017-09-20 DIAGNOSIS — G620 Drug-induced polyneuropathy: Secondary | ICD-10-CM

## 2017-09-20 DIAGNOSIS — C20 Malignant neoplasm of rectum: Secondary | ICD-10-CM

## 2017-09-20 DIAGNOSIS — D500 Iron deficiency anemia secondary to blood loss (chronic): Secondary | ICD-10-CM | POA: Diagnosis not present

## 2017-09-20 NOTE — Telephone Encounter (Signed)
Scheduled appt per 1/24 los - pt is aware - did not print out.

## 2017-10-17 ENCOUNTER — Encounter (HOSPITAL_COMMUNITY)
Admission: RE | Admit: 2017-10-17 | Discharge: 2017-10-17 | Disposition: A | Discharge status: Home / Self Care | Payer: BLUE CROSS/BLUE SHIELD | Source: Ambulatory Visit | Attending: Hematology | Admitting: Hematology

## 2017-10-17 DIAGNOSIS — C20 Malignant neoplasm of rectum: Secondary | ICD-10-CM | POA: Diagnosis not present

## 2017-10-17 LAB — GLUCOSE, CAPILLARY: GLUCOSE-CAPILLARY: 103 mg/dL — AB (ref 65–99)

## 2017-10-17 MED ORDER — FLUDEOXYGLUCOSE F - 18 (FDG) INJECTION
7.9800 | Freq: Once | INTRAVENOUS | Status: AC | PRN
  Administered 2017-10-17: 7.98 via INTRAVENOUS

## 2017-10-17 NOTE — Progress Notes (Signed)
[place]  [phone] [phone]  Clinic Follow Up Note   Patient Care Team: Robyn Haber, MD as PCP - General (Family Medicine) Michael Boston, MD as Consulting Physician (General Surgery) Pyrtle, Lajuan Lines, MD as Consulting Physician (Gastroenterology) Truitt Merle, MD as Consulting Physician (Hematology and Oncology) Kyung Rudd, MD as Consulting Physician (Radiation Oncology)   Date of Service:  10/19/2017  CHIEF COMPLAINTS:  Follow up rectal adenocarcinoma  Oncology History   Cancer Staging Rectal adenocarcinoma [facility]) Staging form: Colon and Rectum, AJCC 8th Edition - Clinical stage from 10/09/2016: Stage IIIB (cT3, cN1b, cM0) - Signed by Truitt Merle, MD on 10/25/2016 - Pathologic stage from 02/21/2017: Stage IIA (ypT3, pN0, cM0) - Signed by Truitt Merle, MD on 10/18/2017       Rectal adenocarcinoma ([place])   10/03/2016 - 10/03/2016 Hospital Admission    Admit date: 10/03/16 The patient presented to the [facility] ED with rectal bleeding and difficulty with hygiene.      10/09/2016 Initial Diagnosis    Rectal adenocarcinoma       10/09/2016 Procedure    Colonoscopy showed a fungating, infiltrative and ulcerated partially obstructing large mass in the distal rectum, measuring 9 cm, prolapsing at anal canal. Scope not advanced proximal to the rectosigmoid colon.      10/09/2016 Initial Biopsy    Rectal mass biopsy showed invasive adenocarcinoma.      10/13/2016 Imaging    CT Chest Abdomen Pelvis w contrast IMPRESSION: Large irregular enhancing rectal mass consistent with known rectal cancer. There are small perirectal and small pelvic sidewall lymph nodes which are suspicious. Prominent perirectal vessels are also noted. No evidence of metastatic retroperitoneal or sigmoid mesocolon adenopathy or hepatic or pulmonary metastasis.      10/24/2016 Imaging    MRI Pelvis IMPRESSION: 1. Moderately motion degraded exam. 2. Large mass is centered at  the anus with extension into the low rectum. No complicating obstruction. 3. Mild mesorectal adenopathy, suspicious. 4. Right inguinal hernia containing nonobstructive small bowel.       11/08/2016 - 12/19/2016 Chemotherapy    Xeloda 50 mg twice daily, with concurrent radiation, held since 12/03/2016 due to colitis and hospitalization      11/08/2016 - 12/19/2016 Radiation Therapy    Neoadjuvant radiation to his rectal cancer  1) Pelvis/ 45 Gy in 25 fractions  2) Rectum boost/ 9 Gy in 5 fractions Under the care of Dr. Lisbeth Renshaw.      12/03/2016 - 12/09/2016 Hospital Admission    He presented with few days to one week history of diarrhea with 6-7 loose stools daily, along with a fever of 101.5 on the day of admission. Patient was also noted to be hypokalemic, hyponatremic. Hospitalized for further management. Stool studies were sent. C. difficile was negative. GI pathogen panel was positive for Yersinia.  XELODA was held at this time, until infection has cleared.       12/07/2016 Imaging    CT A/P W Contrast  IMPRESSION: 1. Abnormal dilatation of the proximal small bowel loops worrisome for either small bowel obstruction versus small bowel ileus. 2. 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 3. Large right inguinal hernia containing edematous loop of distal small bowel 4. No pneumatosis or bowel perforation.  No abscess. 5. Aortic atherosclerosis 6. Mild decrease in size of rectal mass. No change in right common iliac adenopathy.      02/21/2017 Surgery  XI ROBOT ABDOMINOPERINEAL RESECTION WITH COLOSTOMY by Dr. Johney Maine      02/21/2017 Pathology Results    Diagnosis 02/21/17 1. Colon, segmental resection for tumor, rectosigmoid - INVASIVE ADENOCARCINOMA, MODERATELY DIFFERENTIATED, SPANNING 3.9 CM. - TUMOR INVADES THROUGH MUSCULARIS PROPRIA. - RESECTION MARGINS ARE NEGATIVE. - EIGHT OF  EIGHT LYMPH NODES NEGATIVE FOR CARCINOMA (0/8), SEE COMMENT. - SEE ONCOLOGY TABLE. 2. Soft tissue, biopsy, left lateral pelvic floor - BENIGN SKELETAL MUSCLE AND SOFT TISSUE. - NO MALIGNANCY IDENTIFIED. 3. Colon, segmental resection, proximal sigmoid - BENIGN COLONIC MUCOSA. - NO DYSPLASIA OR MALIGNANCY. 4. Liver, biopsy, liver mass - BENIGN VASCULAR PROLIFERATION CONSISTENT WITH HEMANGIOMA. - NO MALIGNANCY IDENTIFIED.      04/04/2017 - 08/12/2017 Chemotherapy    CAPOX every 3 weeks for 6 cycles. I decreased his first cycle of oxaliplatin platinum by 15%, he tolerated well, so we increased to full dose from cycle 2. He completed 5 cycles of oxaliplatin on 07/16/17 and completed Xeloda on 08/12/17.        08/15/2017 Procedure    Colonoscopy 08/15/17 IMPRESSION - Patent end colostomy with healthy appearing mucosa in the sigmoid colon. - The entire examined colon is normal. - No specimens collected.      09/18/2017 Imaging    CT CAP W Contrast 09/18/17 IMPRESSION: Interval abdomino-peritoneal resection with left lower quadrant colostomy. Increased diffuse cystitis, likely due to radiation. New presacral soft tissue density with central low attenuation, also suspicious for post treatment changes. Recommend continued attention on follow-up imaging. New mild retroperitoneal lymphadenopathy in the aortocaval space, measuring up to 1.8 cm. This is suspicious for metastatic disease, although it is conceivable that this could be reactive in etiology due to interval surgery and radiation therapy. Recommend short-term follow-up by CT in 3 months. New indeterminate 4 mm left upper lobe pulmonary nodule, likely inflammatory in etiology with metastatic disease considered less likely. Recommend continued attention on follow-up CT. Bilateral inguinal hernias, largest on the right containing several small bowel loops.      10/17/2017 PET scan    MPRESSION: Increased hypermetabolic  lymphadenopathy in abdominal retroperitoneum, right common iliac chain, and left supraclavicular region, consistent with metastatic disease. Stable tiny bilateral pulmonary nodules show no FDG uptake but are too small to characterize by PET. Recommend continued attention on follow-up CT. 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      HISTORY OF PRESENTING ILLNESS (10/25/16):  David Clarke 62 y.o. male is here because of his recently diagnosed rectal adenocarcinoma. He was referred by his gastroenterologist title parietal. Patient presents to my clinic, accompanied by his wife and his daughter.       He has noticed a anal mass accompanied by intermittent bleeding and pain in the rectum for the past 5 months. The patient initially presented to the [facility] ED on 10/03/16 with rectal bleeding and difficulty with hygiene. He was without health insurance previously and so did not pursue evaluation or treatment until this time.  The patient was seen by Dr. Hilarie Fredrickson for a colonoscopy and biopsy on 10/09/16, revealing a large partially obstructing low rectal mass, prolapsing to anal verge. Biopsy showed adenocarcinoma. CT C/A/P on 10/13/16 revealed a large, irregular, enhancing mass consistent with known rectal cancer. There were several suspicious small perirectal and small pelvic sidewall lymph nodes. Prominent perirectal vessels were also noted. MRI of the pelvis on 10/24/16 showed a large mass centered at the anus with extension into the low rectum without obstruction. There was suspicious mild  mesorectal adenopathy, and a right inguinal hernia containing nonobstructive small bowel.   The patient comes to the clinic for consultation today accompanied by his partner and daughter. He denies abdominal cramps, nausea, bowel or bladder concerns. He reports taking OTC stool softener as needed. He reports his appetite is good, but he has lost 12-15 lbs in the past few weeks. His  daughter notes she has hemochromatosis and questions if this could be playing a role in the patient's anemia.  CURRENT THERAPY:  Observation   INTERIM HISTORY:  David Clarke presents for follow up. He presents to the clinic today with his wife and daughter. He reports he is doing well and only notes to have some glute area muscle pain. He is still working at his business and trying to stay active. He reports that he has intermittent neuropathy in his fingers and toes. He states he has been suffering from a sinus infection in the past 2 weeks.   On review of systems, pt denies fever, or any other complaints at this time. Pertinent positives are listed and detailed within the above HPI.   MEDICAL HISTORY:  Past Medical History:  Diagnosis Date  . Allergy   . Cancer ([place]) 10/09/2016   rectal adenocarcinoma  . History of hiatal hernia     SURGICAL HISTORY: Past Surgical History:  Procedure Laterality Date  . COLONOSCOPY W/ BIOPSIES Bilateral 10/09/2016   rectal adenocarcinoma  . MOUTH SURGERY     age 87, to remove extra "eye" teeth  . [place] teeth extraction    . XI ROBOT ABDOMINAL PERINEAL RESECTION N/A 02/21/2017   Procedure: XI ROBOT ABDOMINOPERINEAL RESECTION WITH COLOSTOMY;  Surgeon: Michael Boston, MD;  Location: [facility];  Service: General;  Laterality: N/A;    SOCIAL HISTORY: Social History   Socioeconomic History  . Marital status: Single    Spouse name: Not on file  . Number of children: 1  . Years of education: Not on file  . Highest education level: Not on file  Social Needs  . Financial resource strain: Not on file  . Food insecurity - worry: Not on file  . Food insecurity - inability: Not on file  . Transportation needs - medical: Not on file  . Transportation needs - non-medical: Not on file  Occupational History  . Occupation: Architectural technologist  Tobacco Use  . Smoking status: Former Smoker    Years: 2.00    Types: Cigarettes    Last attempt to quit: 2002     Years since quitting: 17.1  . Smokeless tobacco: Never Used  Substance and Sexual Activity  . Alcohol use: Yes    Alcohol/week: 6.0 oz    Types: 4 Glasses of wine, 6 Cans of beer per week    Comment: beer or a glass or wine most days of the week  . Drug use: No  . Sexual activity: Yes    Partners: Female  Other Topics Concern  . Not on file  Social History Narrative  . Not on file    FAMILY HISTORY: Family History  Problem Relation Age of Onset  . Other Mother        bile duct tumor  . Stroke Mother   . Cancer Mother 70       bile duct cancer   . Prostate cancer Father 59  . Emphysema Father   . Colon cancer Neg Hx   . Esophageal cancer Neg Hx   . Pancreatic cancer Neg Hx   . Rectal  cancer Neg Hx   . Stomach cancer Neg Hx     ALLERGIES:  is allergic to sulfa antibiotics.  MEDICATIONS:  Current Outpatient Medications  Medication Sig Dispense Refill  . Cyanocobalamin (VITAMIN B 12 PO) Take 1 tablet by mouth daily.    . Multiple Vitamin (MULTIVITAMIN WITH MINERALS) TABS tablet Take 1 tablet by mouth 3 (three) times a week.    . ondansetron (ZOFRAN) 8 MG tablet Take 1 tablet (8 mg total) by mouth 2 (two) times daily as needed for refractory nausea / vomiting. Start on day 3 after chemotherapy. (Patient not taking: Reported on 10/19/2017) 30 tablet 1  . prochlorperazine (COMPAZINE) 10 MG tablet Take 1 tablet (10 mg total) by mouth every 6 (six) hours as needed (Nausea or vomiting). (Patient not taking: Reported on 10/19/2017) 30 tablet 1   Current Facility-Administered Medications  Medication Dose Route Frequency Provider Last Rate Last Dose  . 0.9 %  sodium chloride infusion  500 mL Intravenous Continuous Pyrtle, Lajuan Lines, MD      . 0.9 %  sodium chloride infusion  500 mL Intravenous Once Pyrtle, Lajuan Lines, MD        REVIEW OF SYSTEMS:   Constitutional: Denies fevers, chills or abnormal night sweats (+) weight gain  Eyes: Denies blurriness of vision, double vision or watery  eyes Ears, nose, mouth, throat, and face: Denies mucositis or sore throat   Respiratory: Denies cough, dyspnea or wheezes Cardiovascular: Denies palpitation, chest discomfort or (+) bilateral feet swelling when on feet for long periods of time.  Gastrointestinal:  Denies nausea, heartburn or change in bowel habits (+) colostomy bag  Skin: Negative Lymphatics: Denies new lymphadenopathy or easy bruising MSK: (+) Hand and wrist joint sensitivity (+) glute pain Neurological: (+) improved neuropathy (+) occasional mild feet numbness Behavioral/Psych: Mood is stable, no new changes  All other systems were reviewed with the patient and are negative.   PHYSICAL EXAMINATION: ECOG PERFORMANCE STATUS: 1 - Symptomatic but completely ambulatory  Vitals:   10/19/17 1131  BP: 133/84  Pulse: 66  Resp: 18  Temp: 98.4 F (36.9 C)  SpO2: 97%   Filed Weights   10/19/17 1131  Weight: 155 lb 6.4 oz (70.5 kg)      GENERAL: alert, no distress and comfortable.  SKIN: skin color, texture, turgor are normal, no rashes or significant lesions NECK: supple, thyroid normal size, non-tender, without nodularity LYMPH: (+) 2 cm left supraclavicular lymph node is palpable, non-tender  LUNGS: clear to auscultation and percussion with normal breathing effort HEART: regular rate & rhythm and no murmurs and no lower extremity edema ABDOMEN: abdomen soft, non-tender and normal bowel sounds (+) laparoscopic incisions all healed Musculoskeletal: no cyanosis of digits and no clubbing  PSYCH: alert & oriented x 3 with fluent speech NEURO: no focal motor/sensory deficits.     LABORATORY DATA:  I have reviewed the data as listed CBC Latest Ref Rng & Units 09/18/2017 08/10/2017 07/16/2017  WBC 4.0 - 10.3 K/uL 5.7 3.4(L) 4.1  Hemoglobin 13.0 - 17.1 g/dL 14.4 12.3(L) 12.5(L)  Hematocrit 38.4 - 49.9 % 42.8 37.1(L) 37.8(L)  Platelets 140 - 400 K/uL 232 123(L) 160   CMP Latest Ref Rng & Units 09/18/2017 08/10/2017  07/16/2017  Glucose 70 - 140 mg/dL 95 133 133  BUN 7 - 26 mg/dL 13 16.5 12.0  Creatinine 0.70 - 1.30 mg/dL 0.85 0.8 0.8  Sodium 136 - 145 mmol/L 139 137 137  Potassium 3.5 - 5.1 mmol/L 4.2 3.5  3.6  Chloride 98 - 109 mmol/L 104 - -  CO2 22 - 29 mmol/L 27 21(L) 22  Calcium 8.4 - 10.4 mg/dL 9.4 9.0 9.0  Total Protein 6.4 - 8.3 g/dL 7.8 7.1 7.5  Total Bilirubin 0.2 - 1.2 mg/dL 0.6 1.32(H) 0.66  Alkaline Phos 40 - 150 U/L 140 137 141  AST 5 - 34 U/L 34 47(H) 42(H)  ALT 0 - 55 U/L 29 38 39     CEA ([facility])  10/27/2016: 7.81 11/27/2016: 5.56 01/10/2017: 5.61 03/14/17: 5.11 04/11/17: 5.87 05/23/17: 8.79 06/19/17:  9.15 07/16/17:  9.88 08/10/17: 10.40 09/18/17: 8.31   PATHOLOGY:   Diagnosis 02/21/17 1. Colon, segmental resection for tumor, rectosigmoid - INVASIVE ADENOCARCINOMA, MODERATELY DIFFERENTIATED, SPANNING 3.9 CM. - TUMOR INVADES THROUGH MUSCULARIS PROPRIA. - RESECTION MARGINS ARE NEGATIVE. - EIGHT OF EIGHT LYMPH NODES NEGATIVE FOR CARCINOMA (0/8), SEE COMMENT. - SEE ONCOLOGY TABLE. 2. Soft tissue, biopsy, left lateral pelvic floor - BENIGN SKELETAL MUSCLE AND SOFT TISSUE. - NO MALIGNANCY IDENTIFIED. 3. Colon, segmental resection, proximal sigmoid - BENIGN COLONIC MUCOSA. - NO DYSPLASIA OR MALIGNANCY. 4. Liver, biopsy, liver mass - BENIGN VASCULAR PROLIFERATION CONSISTENT WITH HEMANGIOMA. - NO MALIGNANCY IDENTIFIED. Microscopic Comment 1. COLON AND RECTUM (INCLUDING TRANS-ANAL RESECTION): Specimen: Rectosigmoid colon with anus. Procedure: Abdominoperineal resection. Tumor site: Distal 1/3 rectum to anus. Specimen integrity: Intact. Macroscopic intactness of mesorectum: Incomplete. Macroscopic tumor perforation: Not identified. Invasive tumor: Maximum size: 3.9 cm Histologic type(s): Invasive adenocarcinoma. Histologic grade and differentiation: G2: moderately differentiated/low grade Type of polyp in which invasive carcinoma arose: N/A. Microscopic extension  of invasive tumor: Tumor invades through muscularis propria. Lymph-Vascular invasion: Not identified. 1 of 3 FINAL for SIERRA, BISSONETTE [phone]) Microscopic Comment(continued) Peri-neural invasion: Present. Tumor deposit(s) (discontinuous extramural extension): Not identified. Resection margins: Proximal margin: Negative. Distal margin: Negative. Circumferential (radial) (posterior ascending, posterior descending; lateral and posterior mid-rectum; and entire lower 1/3 rectum): Negative. Mesenteric margin (sigmoid and transverse): Negative. Distance closest margin (if all above margins negative): <0.1 cm radial margin. >1 cm distal margin. Treatment effect (neo-adjuvant therapy): Present Additional polyp(s): N/A. Non-neoplastic findings: None. Lymph nodes: number examined 8; number positive: 0 (treatment effect present). Pathologic Staging: ypT3, ypN0, ypMX Ancillary studies: Can be performed upon request. Comment: The fat was cleared and additional lymph nodes were not identified.  Diagnosis 10/09/2016 Rectum, biopsy, mass - ADENOCARCINOMA.  RADIOGRAPHIC STUDIES: I have personally reviewed the radiological images as listed and agreed with the findings in the report.  PET Scan 10/17/17 IMPRESSION: Increased hypermetabolic lymphadenopathy in abdominal retroperitoneum, right common iliac chain, and left supraclavicular region, consistent with metastatic disease. Stable tiny bilateral pulmonary nodules show no FDG uptake but are too small to characterize by PET. Recommend continued attention on follow-up CT. 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   CT CAP W Contrast 09/18/17 IMPRESSION: Interval abdomino-peritoneal resection with left lower quadrant colostomy. Increased diffuse cystitis, likely due to radiation. New presacral soft tissue density with central low attenuation, also suspicious for post treatment changes. Recommend continued  attention on follow-up imaging. New mild retroperitoneal lymphadenopathy in the aortocaval space, measuring up to 1.8 cm. This is suspicious for metastatic disease, although it is conceivable that this could be reactive in etiology due to interval surgery and radiation therapy. Recommend short-term follow-up by CT in 3 months. New indeterminate 4 mm left upper lobe pulmonary nodule, likely inflammatory in etiology with metastatic disease considered less likely. Recommend continued attention on follow-up CT. Bilateral inguinal hernias, largest on  the right containing several small bowel loops.   Colonoscopy 08/15/17 IMPRESSION - Patent end colostomy with healthy appearing mucosa in the sigmoid colon. - The entire examined colon is normal. - No specimens collected.  MRI Pelvis W WO Contrast 01/29/17 IMPRESSION: Significant decrease in size of soft tissue mass at the anorectal junction, with post radiation changes. No residual perirectal lymphadenopathy or other signs of pelvic metastatic disease. Stable small right inguinal hernia containing small bowel.  CT A/P W Contrast 12/07/16 IMPRESSION: 1. Abnormal dilatation of the proximal small bowel loops worrisome for either small bowel obstruction versus small bowel ileus. 2. 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 3. Large right inguinal hernia containing edematous loop of distal small bowel 4. No pneumatosis or bowel perforation.  No abscess. 5. Aortic atherosclerosis 6. Mild decrease in size of rectal mass. No change in right common iliac adenopathy.  DG Chest 2 View 12/03/2016 IMPRESSION: 1. No active cardiopulmonary disease. No evidence of pneumonia or pulmonary edema. No evidence of intrathoracic metastasis. 2. Hyperexpanded lungs suggesting COPD.  MRI Pelvis 10/24/2016 IMPRESSION: 1. Moderately motion degraded exam. 2.  Large mass is centered at the anus with extension into the low rectum. No complicating obstruction. 3. Mild mesorectal adenopathy, suspicious. 4. Right inguinal hernia containing nonobstructive small bowel.  CT CAP w Contrast 10/13/2016 IMPRESSION: Large irregular enhancing rectal mass consistent with known rectal cancer. There are small perirectal and small pelvic sidewall lymph nodes which are suspicious. Prominent perirectal vessels are also noted. No evidence of metastatic retroperitoneal or sigmoid mesocolon adenopathy or hepatic or pulmonary metastasis.  COLONOSCOPY 10/09/2016 Dr. Hilarie Fredrickson  A fungating, infiltrative and ulcerated partially obstructing large mass was found in the distal rectum. The mass was circumferential. The mass measured seven cm in length (prolapsing and from dentate line to approx 7 cm). In addition, its inner diameter measured nine mm. Oozing was present. This was biopsied with a cold forceps for histology. - Due to the distal rectal mass the bowel preparation was poor and unable to be cleared via the adult upper endoscope (adult upper endoscope used due to obstructing mass). Scope not advanced proximal to the rectosigmoid colon.  ASSESSMENT & PLAN: 62 y.o.  gentleman, previously healthy, presented with a anal mass and a mild intermittent rectal bleeding for 5-6 months. Fatigue, anorexia, fatigue and weight loss for a month. He is currently on radiation for his rectal cancer, chemotherapy has been held lately.  1. Rectal Adenocarcinoma, low rectum, [id number], ypT3N0, stage IIIB, MSI-stable, node metastasis 09/2017  -I previously reviewed his CT and MRI scan, colonoscopy and biopsy findings in detail with pt and his family members -Based on his imaging findings, he had clinical stage III disease. We reviewed his image in our GI tumor Board, he also has a small liver lesion which is indeterminate, likely benign, and we will follow up with a repeated CT in about 3  months.  -patient received neoadjuvant chemotherapy and radiation with Xeloda, which was held for the last few weeks due to infectious colitis. -He underwent APR on 02/21/2017, surgical margins were negative, all lymph nodes were negative. We've reviewed his surgical pathology findings he did have significant treatment effect on his surgical sample.  - We discussed the role of adjuvant chemotherapy. Given his stage III B disease, I recommend adjuvant FOLFOX or CAPOX. The patient opted CAPOX, which is every 3 weeks, for total 6 cycles.  -He recovered  well from surgery, started adjuvant CAPOX 04/04/17. I decreased his first cycle of oxaliplatin platinum by 15%, he tolerated well, so we increased to full dose from cycle 2. He completed 5 cycles of oxaliplatin on 07/16/17 and completed Xeloda on 08/12/17. His oxaliplatin treatment was postponed a few times due to his working schedule -He had repeated colonoscopy by Dr. Hilarie Fredrickson in 07/2017 which was negative  -We discussed his CT CAP from 09/18/17 which shows multiple slightly enlarged RP lymph nodes and a 4 mm lung nodule in the left lower lobe. The enlargement of lymph nodes is possible he reactive, or metastatic recurrent colon cancer.  He is clinically doing well, asymmetric, lab reviewed, CEA has been slightly elevated since his surgery. I suggest getting a PET scan in 4 weeks.  -PET Scan from 10/17/17 reveals increased hypermetabolic lymphadenopathy in abdominal retroperitoneum, right common iliac chain, and left supraclavicular region, consistent with metastatic disease and stable tiny bilateral pulmonary nodules show no FDG uptake but are too small to characterize by PET. I discussed results with pt and his family. I suspect that this is metastatic disease. He will get a biopsy to confirm this.  -if biopsy is positive for cancer, the treatment will be chemotherapy likely FOLFIRI and foundation 1 testing to see if he is a candidate for immunotherapy or targeted  therapy. He will need PAC placement for 5-FU, he is agreeable for this. I explained that if he develops pain from enlarged lymph nodes, radiation may be an option to control that -F/u after biopsy   2. 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 -Hemoglobin was 8.3 initially with low MCV, likely anemia of iron deficiency from chronic blood loss. This was confirmed by his iron study. 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 -previously repeated iron study still showed low iron, his anemia has gotten worse lately, I previously recommend him to take oral iron 1-2 tab a day for a month.  -He restarted oral iron.  -I suggested he watches his BM while on oral iron.  -his repeat iron level on 03/14/17 is 15 with saturation 5%, ferritin is normal, I gave one dose IV iron infusion, anemia has improved - his Hg is 10.1 (04/04/17) and improved to 11.5 on 05/23/17 -on 06/19/17 the patient reports he has been off iron for a few months, saturation 11%, ferritin is normal. I recommended he restart oral iron 1 tablet daily -His repeat iron study was normal in 07/2017, I would let him stop oral iron supplement. -currently resolved   3.  Fatigue -Likely the combination of anemia, chemotherapy and radiation and surgery. -I encouraged him to be well, and exercise -much Improved.  4. Peripheral neuropathy, secondary to chemo  -mild numbness, no decreased sensation on exam, hand function is normal  -He has slightly more noticeable neuropathy, but hand functions are normal and adequate for treatment -continue monitoring closely    -He takes B complex and his neuropathy has improved.   5.Goal of care discussion  -We again discussed the incurable nature of his cancer, and the overall poor prognosis due to the metastatic disease, especially if he does not have good  response to chemotherapy or progress on chemo -The patient understands the goal of care is palliative, if biopsy confirm metastasis  -he is full code for now    Plan  -I personally reviewed his PET scan images, which is highly suspicious for metastatic disease to lymph nodes  -I recommend biopsy of supraclavicular lymph node within 1 week by IR  -schedule port placement after biopsy results -F/u  after biopsy   All questions were answered. The patient knows to call the clinic with any problems, questions or concerns.  I spent 30 minutes counseling the patient face to face. The total time spent in the appointment was 40 minutes and more than 50% was on counseling.  This document serves as a record of services personally performed by Truitt Merle, MD. It was created on her behalf by Theresia Bough, a trained medical scribe. The creation of this record is based on the scribe's personal observations and the provider's statements to them.   I have reviewed the above documentation for accuracy and completeness, and I agree with the above.     Truitt Merle, MD 10/19/2017

## 2017-10-19 ENCOUNTER — Telehealth: Payer: Self-pay | Admitting: Hematology

## 2017-10-19 ENCOUNTER — Encounter: Payer: Self-pay | Admitting: Hematology

## 2017-10-19 ENCOUNTER — Inpatient Hospital Stay: Payer: BLUE CROSS/BLUE SHIELD | Attending: Hematology | Admitting: Hematology

## 2017-10-19 VITALS — BP 133/84 | HR 66 | Temp 98.4°F | Resp 18 | Ht 72.0 in | Wt 155.4 lb

## 2017-10-19 DIAGNOSIS — C779 Secondary and unspecified malignant neoplasm of lymph node, unspecified: Secondary | ICD-10-CM

## 2017-10-19 DIAGNOSIS — G620 Drug-induced polyneuropathy: Secondary | ICD-10-CM

## 2017-10-19 DIAGNOSIS — D500 Iron deficiency anemia secondary to blood loss (chronic): Secondary | ICD-10-CM

## 2017-10-19 DIAGNOSIS — R5383 Other fatigue: Secondary | ICD-10-CM

## 2017-10-19 DIAGNOSIS — C20 Malignant neoplasm of rectum: Secondary | ICD-10-CM

## 2017-10-19 NOTE — Telephone Encounter (Signed)
Per 2/22 los - IR to contact patient once order is in - no apts scheduled

## 2017-10-23 ENCOUNTER — Encounter: Payer: Self-pay | Admitting: Hematology

## 2017-10-25 ENCOUNTER — Other Ambulatory Visit: Payer: Self-pay | Admitting: Radiology

## 2017-10-26 ENCOUNTER — Telehealth: Payer: Self-pay

## 2017-10-26 ENCOUNTER — Other Ambulatory Visit: Payer: Self-pay | Admitting: Radiology

## 2017-10-26 ENCOUNTER — Telehealth: Payer: Self-pay | Admitting: Hematology

## 2017-10-26 NOTE — Telephone Encounter (Signed)
Returned pt call and informed him a lab appointment has been added for Tuesday.  Cyndia Bent RN

## 2017-10-26 NOTE — Progress Notes (Signed)
[name]  [phone] [phone]  Clinic Follow Up Note   Patient Care Team: Robyn Haber, MD as PCP - General (Family Medicine) Michael Boston, MD as Consulting Physician (General Surgery) Pyrtle, Lajuan Lines, MD as Consulting Physician (Gastroenterology) Truitt Merle, MD as Consulting Physician (Hematology and Oncology) Kyung Rudd, MD as Consulting Physician (Radiation Oncology)   Date of Service:  10/30/2017  CHIEF COMPLAINTS:  Follow up rectal adenocarcinoma  Oncology History   Cancer Staging Rectal adenocarcinoma [facility]) Staging form: Colon and Rectum, AJCC 8th Edition - Clinical stage from 10/09/2016: Stage IIIB (cT3, cN1b, cM0) - Signed by Truitt Merle, MD on 10/25/2016 - Pathologic stage from 02/21/2017: Stage IIA (ypT3, pN0, cM0) - Signed by Truitt Merle, MD on 10/18/2017       Rectal adenocarcinoma ([place])   10/03/2016 - 10/03/2016 Hospital Admission    Admit date: 10/03/16 The patient presented to the [facility] ED with rectal bleeding and difficulty with hygiene.      10/09/2016 Initial Diagnosis    Rectal adenocarcinoma       10/09/2016 Procedure    Colonoscopy showed a fungating, infiltrative and ulcerated partially obstructing large mass in the distal rectum, measuring 9 cm, prolapsing at anal canal. Scope not advanced proximal to the rectosigmoid colon.      10/09/2016 Initial Biopsy    Rectal mass biopsy showed invasive adenocarcinoma.      10/13/2016 Imaging    CT Chest Abdomen Pelvis w contrast IMPRESSION: Large irregular enhancing rectal mass consistent with known rectal cancer. There are small perirectal and small pelvic sidewall lymph nodes which are suspicious. Prominent perirectal vessels are also noted. No evidence of metastatic retroperitoneal or sigmoid mesocolon adenopathy or hepatic or pulmonary metastasis.      10/24/2016 Imaging    MRI Pelvis IMPRESSION: 1. Moderately motion degraded exam. 2. Large mass is centered at  the anus with extension into the low rectum. No complicating obstruction. 3. Mild mesorectal adenopathy, suspicious. 4. Right inguinal hernia containing nonobstructive small bowel.       11/08/2016 - 12/19/2016 Chemotherapy    Xeloda 50 mg twice daily, with concurrent radiation, held since 12/03/2016 due to colitis and hospitalization      11/08/2016 - 12/19/2016 Radiation Therapy    Neoadjuvant radiation to his rectal cancer  1) Pelvis/ 45 Gy in 25 fractions  2) Rectum boost/ 9 Gy in 5 fractions Under the care of Dr. Lisbeth Renshaw.      12/03/2016 - 12/09/2016 Hospital Admission    He presented with few days to one week history of diarrhea with 6-7 loose stools daily, along with a fever of 101.5 on the day of admission. Patient was also noted to be hypokalemic, hyponatremic. Hospitalized for further management. Stool studies were sent. C. difficile was negative. GI pathogen panel was positive for Yersinia.  XELODA was held at this time, until infection has cleared.       12/07/2016 Imaging    CT A/P W Contrast  IMPRESSION: 1. Abnormal dilatation of the proximal small bowel loops worrisome for either small bowel obstruction versus small bowel ileus. 2. 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 3. Large right inguinal hernia containing edematous loop of distal small bowel 4. No pneumatosis or bowel perforation.  No abscess. 5. Aortic atherosclerosis 6. Mild decrease in size of rectal mass. No change in right common iliac adenopathy.      02/21/2017 Surgery  XI ROBOT ABDOMINOPERINEAL RESECTION WITH COLOSTOMY by Dr. Johney Maine      02/21/2017 Pathology Results    Diagnosis 02/21/17 1. Colon, segmental resection for tumor, rectosigmoid - INVASIVE ADENOCARCINOMA, MODERATELY DIFFERENTIATED, SPANNING 3.9 CM. - TUMOR INVADES THROUGH MUSCULARIS PROPRIA. - RESECTION MARGINS ARE NEGATIVE. - EIGHT OF  EIGHT LYMPH NODES NEGATIVE FOR CARCINOMA (0/8), SEE COMMENT. - SEE ONCOLOGY TABLE. 2. Soft tissue, biopsy, left lateral pelvic floor - BENIGN SKELETAL MUSCLE AND SOFT TISSUE. - NO MALIGNANCY IDENTIFIED. 3. Colon, segmental resection, proximal sigmoid - BENIGN COLONIC MUCOSA. - NO DYSPLASIA OR MALIGNANCY. 4. Liver, biopsy, liver mass - BENIGN VASCULAR PROLIFERATION CONSISTENT WITH HEMANGIOMA. - NO MALIGNANCY IDENTIFIED.      04/04/2017 - 08/12/2017 Chemotherapy    CAPOX every 3 weeks for 6 cycles. I decreased his first cycle of oxaliplatin platinum by 15%, he tolerated well, so we increased to full dose from cycle 2. He completed 5 cycles of oxaliplatin on 07/16/17 and completed Xeloda on 08/12/17.        08/15/2017 Procedure    Colonoscopy 08/15/17 IMPRESSION - Patent end colostomy with healthy appearing mucosa in the sigmoid colon. - The entire examined colon is normal. - No specimens collected.      09/18/2017 Imaging    CT CAP W Contrast 09/18/17 IMPRESSION: Interval abdomino-peritoneal resection with left lower quadrant colostomy. Increased diffuse cystitis, likely due to radiation. New presacral soft tissue density with central low attenuation, also suspicious for post treatment changes. Recommend continued attention on follow-up imaging. New mild retroperitoneal lymphadenopathy in the aortocaval space, measuring up to 1.8 cm. This is suspicious for metastatic disease, although it is conceivable that this could be reactive in etiology due to interval surgery and radiation therapy. Recommend short-term follow-up by CT in 3 months. New indeterminate 4 mm left upper lobe pulmonary nodule, likely inflammatory in etiology with metastatic disease considered less likely. Recommend continued attention on follow-up CT. Bilateral inguinal hernias, largest on the right containing several small bowel loops.      10/17/2017 PET scan    MPRESSION: Increased hypermetabolic  lymphadenopathy in abdominal retroperitoneum, right common iliac chain, and left supraclavicular region, consistent with metastatic disease. Stable tiny bilateral pulmonary nodules show no FDG uptake but are too small to characterize by PET. Recommend continued attention on follow-up CT. 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      HISTORY OF PRESENTING ILLNESS (10/25/16):  David Clarke 62 y.o. male is here because of his recently diagnosed rectal adenocarcinoma. He was referred by his gastroenterologist title parietal. Patient presents to my clinic, accompanied by his wife and his daughter.       He has noticed a anal mass accompanied by intermittent bleeding and pain in the rectum for the past 5 months. The patient initially presented to the [facility] ED on 10/03/16 with rectal bleeding and difficulty with hygiene. He was without health insurance previously and so did not pursue evaluation or treatment until this time.  The patient was seen by Dr. Hilarie Fredrickson for a colonoscopy and biopsy on 10/09/16, revealing a large partially obstructing low rectal mass, prolapsing to anal verge. Biopsy showed adenocarcinoma. CT C/A/P on 10/13/16 revealed a large, irregular, enhancing mass consistent with known rectal cancer. There were several suspicious small perirectal and small pelvic sidewall lymph nodes. Prominent perirectal vessels were also noted. MRI of the pelvis on 10/24/16 showed a large mass centered at the anus with extension into the low rectum without obstruction. There was suspicious  mild mesorectal adenopathy, and a right inguinal hernia containing nonobstructive small bowel.   The patient comes to the clinic for consultation today accompanied by his partner and daughter. He denies abdominal cramps, nausea, bowel or bladder concerns. He reports taking OTC stool softener as needed. He reports his appetite is good, but he has lost 12-15 lbs in the past few weeks. His  daughter notes she has hemochromatosis and questions if this could be playing a role in the patient's anemia.  CURRENT THERAPY:  Pending FOLFIRI every 2 weeks    INTERIM HISTORY:  David Clarke presents for follow up. He presents to the clinic today with his wife to discuss his biopsy results.  He tolerated biopsy very well yesterday, no complaints.  His pain in the right buttock seems to be slightly worse in the past week.  No weakness on legs, no other new symptoms.  MEDICAL HISTORY:  Past Medical History:  Diagnosis Date   Allergy    Cancer ([place]) 10/09/2016   rectal adenocarcinoma   History of hiatal hernia     SURGICAL HISTORY: Past Surgical History:  Procedure Laterality Date   COLONOSCOPY W/ BIOPSIES Bilateral 10/09/2016   rectal adenocarcinoma   MOUTH SURGERY     age 27, to remove extra "eye" teeth   [place] teeth extraction     XI ROBOT ABDOMINAL PERINEAL RESECTION N/A 02/21/2017   Procedure: XI ROBOT ABDOMINOPERINEAL RESECTION WITH COLOSTOMY;  Surgeon: Michael Boston, MD;  Location: [facility];  Service: General;  Laterality: N/A;    SOCIAL HISTORY: Social History   Socioeconomic History   Marital status: Single    Spouse name: Not on file   Number of children: 1   Years of education: Not on file   Highest education level: Not on file  Social Needs   Financial resource strain: Not on file   Food insecurity - worry: Not on file   Food insecurity - inability: Not on file   Transportation needs - medical: Not on file   Transportation needs - non-medical: Not on file  Occupational History   Occupation: Architectural technologist  Tobacco Use   Smoking status: Former Smoker    Years: 2.00    Types: Cigarettes    Last attempt to quit: 2002    Years since quitting: 17.1   Smokeless tobacco: Never Used  Substance and Sexual Activity   Alcohol use: Yes    Alcohol/week: 6.0 oz    Types: 4 Glasses of wine, 6 Cans of beer per week    Comment: beer or a glass  or wine most days of the week   Drug use: No   Sexual activity: Yes    Partners: Female  Other Topics Concern   Not on file  Social History Narrative   Not on file    FAMILY HISTORY: Family History  Problem Relation Age of Onset   Other Mother        bile duct tumor   Stroke Mother    Cancer Mother 95       bile duct cancer    Prostate cancer Father 74   Emphysema Father    Colon cancer Neg Hx    Esophageal cancer Neg Hx    Pancreatic cancer Neg Hx    Rectal cancer Neg Hx    Stomach cancer Neg Hx     ALLERGIES:  is allergic to sulfa antibiotics.  MEDICATIONS:  Current Outpatient Medications  Medication Sig Dispense Refill   Cyanocobalamin (VITAMIN B 12  PO) Take 1 tablet by mouth daily.     lidocaine (LIDODERM) 5 % Place 1 patch onto the skin daily. Remove & Discard patch within 12 hours or as directed by MD 30 patch 0   Multiple Vitamin (MULTIVITAMIN WITH MINERALS) TABS tablet Take 1 tablet by mouth every other day.      Current Facility-Administered Medications  Medication Dose Route Frequency Provider Last Rate Last Dose   0.9 %  sodium chloride infusion  500 mL Intravenous Continuous Pyrtle, Lajuan Lines, MD       0.9 %  sodium chloride infusion  500 mL Intravenous Once Pyrtle, Lajuan Lines, MD        REVIEW OF SYSTEMS:   Constitutional: Denies fevers, chills or abnormal night sweats (+) weight gain  Eyes: Denies blurriness of vision, double vision or watery eyes Ears, nose, mouth, throat, and face: Denies mucositis or sore throat  Respiratory: Denies cough, dyspnea or wheezes Cardiovascular: Denies palpitation, chest discomfort or (+) bilateral feet swelling when on feet for long periods of time.  Gastrointestinal:  Denies nausea, heartburn or change in bowel habits (+) colostomy bag  Skin: Negative Lymphatics: Denies new lymphadenopathy or easy bruising MSK: (+) Hand and wrist joint sensitivity (+) glute pain Neurological: (+) improved neuropathy (+)  occasional mild feet numbness Behavioral/Psych: Mood is stable, no new changes  All other systems were reviewed with the patient and are negative.   PHYSICAL EXAMINATION: ECOG PERFORMANCE STATUS: 1 - Symptomatic but completely ambulatory  Vitals:   10/30/17 1207  BP: (!) 143/93  Pulse: 68  Resp: 18  Temp: 97.7 F (36.5 C)  SpO2: 99%   Filed Weights   10/30/17 1207  Weight: 155 lb 9.6 oz (70.6 kg)     GENERAL: alert, no distress and comfortable.  SKIN: skin color, texture, turgor are normal, no rashes or significant lesions NECK: supple, thyroid normal size, non-tender, without nodularity LYMPH: (+) 2 cm left supraclavicular lymph node is palpable, non-tender  LUNGS: clear to auscultation and percussion with normal breathing effort HEART: regular rate & rhythm and no murmurs and no lower extremity edema ABDOMEN: abdomen soft, non-tender and normal bowel sounds (+) laparoscopic incisions all healed Musculoskeletal: no cyanosis of digits and no clubbing  PSYCH: alert & oriented x 3 with fluent speech NEURO: no focal motor/sensory deficits.     LABORATORY DATA:  I have reviewed the data as listed CBC Latest Ref Rng & Units 10/30/2017 10/29/2017 09/18/2017  WBC 4.0 - 10.3 K/uL 6.6 5.9 5.7  Hemoglobin 13.0 - 17.1 g/dL 14.2 13.9 14.4  Hematocrit 38.4 - 49.9 % 42.3 40.6 42.8  Platelets 140 - 400 K/uL 261 266 232   CMP Latest Ref Rng & Units 10/30/2017 09/18/2017 08/10/2017  Glucose 70 - 140 mg/dL 124 95 133  BUN 7 - 26 mg/dL 13 13 16.5  Creatinine 0.70 - 1.30 mg/dL 0.83 0.85 0.8  Sodium 136 - 145 mmol/L 137 139 137  Potassium 3.5 - 5.1 mmol/L 3.9 4.2 3.5  Chloride 98 - 109 mmol/L 104 104 -  CO2 22 - 29 mmol/L 23 27 21(L)  Calcium 8.4 - 10.4 mg/dL 9.6 9.4 9.0  Total Protein 6.4 - 8.3 g/dL 7.8 7.8 7.1  Total Bilirubin 0.2 - 1.2 mg/dL 0.5 0.6 1.32(H)  Alkaline Phos 40 - 150 U/L 123 140 137  AST 5 - 34 U/L 24 34 47(H)  ALT 0 - 55 U/L 17 29 38     CEA ([facility])  10/27/2016:  7.81  11/27/2016: 5.56 01/10/2017: 5.61 03/14/17: 5.11 04/11/17: 5.87 05/23/17: 8.79 06/19/17:  9.15 07/16/17:  9.88 08/10/17: 10.40 09/18/17: 8.31   PATHOLOGY:  10/29/17 Report pending   Diagnosis 02/21/17 1. Colon, segmental resection for tumor, rectosigmoid - INVASIVE ADENOCARCINOMA, MODERATELY DIFFERENTIATED, SPANNING 3.9 CM. - TUMOR INVADES THROUGH MUSCULARIS PROPRIA. - RESECTION MARGINS ARE NEGATIVE. - EIGHT OF EIGHT LYMPH NODES NEGATIVE FOR CARCINOMA (0/8), SEE COMMENT. - SEE ONCOLOGY TABLE. 2. Soft tissue, biopsy, left lateral pelvic floor - BENIGN SKELETAL MUSCLE AND SOFT TISSUE. - NO MALIGNANCY IDENTIFIED. 3. Colon, segmental resection, proximal sigmoid - BENIGN COLONIC MUCOSA. - NO DYSPLASIA OR MALIGNANCY. 4. Liver, biopsy, liver mass - BENIGN VASCULAR PROLIFERATION CONSISTENT WITH HEMANGIOMA. - NO MALIGNANCY IDENTIFIED. Microscopic Comment 1. COLON AND RECTUM (INCLUDING TRANS-ANAL RESECTION): Specimen: Rectosigmoid colon with anus. Procedure: Abdominoperineal resection. Tumor site: Distal 1/3 rectum to anus. Specimen integrity: Intact. Macroscopic intactness of mesorectum: Incomplete. Macroscopic tumor perforation: Not identified. Invasive tumor: Maximum size: 3.9 cm Histologic type(s): Invasive adenocarcinoma. Histologic grade and differentiation: G2: moderately differentiated/low grade Type of polyp in which invasive carcinoma arose: N/A. Microscopic extension of invasive tumor: Tumor invades through muscularis propria. Lymph-Vascular invasion: Not identified. 1 of 3 FINAL for David Clarke, David Clarke [phone]) Microscopic Comment(continued) Peri-neural invasion: Present. Tumor deposit(s) (discontinuous extramural extension): Not identified. Resection margins: Proximal margin: Negative. Distal margin: Negative. Circumferential (radial) (posterior ascending, posterior descending; lateral and posterior mid-rectum; and entire lower 1/3 rectum): Negative. Mesenteric  margin (sigmoid and transverse): Negative. Distance closest margin (if all above margins negative): <0.1 cm radial margin. >1 cm distal margin. Treatment effect (neo-adjuvant therapy): Present Additional polyp(s): N/A. Non-neoplastic findings: None. Lymph nodes: number examined 8; number positive: 0 (treatment effect present). Pathologic Staging: ypT3, ypN0, ypMX Ancillary studies: Can be performed upon request. Comment: The fat was cleared and additional lymph nodes were not identified.  Diagnosis 10/09/2016 Rectum, biopsy, mass - ADENOCARCINOMA.  RADIOGRAPHIC STUDIES: I have personally reviewed the radiological images as listed and agreed with the findings in the report.  PET Scan 10/17/17 IMPRESSION: Increased hypermetabolic lymphadenopathy in abdominal retroperitoneum, right common iliac chain, and left supraclavicular region, consistent with metastatic disease. Stable tiny bilateral pulmonary nodules show no FDG uptake but are too small to characterize by PET. Recommend continued attention on follow-up CT. 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   CT CAP W Contrast 09/18/17 IMPRESSION: Interval abdomino-peritoneal resection with left lower quadrant colostomy. Increased diffuse cystitis, likely due to radiation. New presacral soft tissue density with central low attenuation, also suspicious for post treatment changes. Recommend continued attention on follow-up imaging. New mild retroperitoneal lymphadenopathy in the aortocaval space, measuring up to 1.8 cm. This is suspicious for metastatic disease, although it is conceivable that this could be reactive in etiology due to interval surgery and radiation therapy. Recommend short-term follow-up by CT in 3 months. New indeterminate 4 mm left upper lobe pulmonary nodule, likely inflammatory in etiology with metastatic disease considered less likely. Recommend continued attention on follow-up  CT. Bilateral inguinal hernias, largest on the right containing several small bowel loops.   Colonoscopy 08/15/17 IMPRESSION - Patent end colostomy with healthy appearing mucosa in the sigmoid colon. - The entire examined colon is normal. - No specimens collected.  MRI Pelvis W WO Contrast 01/29/17 IMPRESSION: Significant decrease in size of soft tissue mass at the anorectal junction, with post radiation changes. No residual perirectal lymphadenopathy or other signs of pelvic metastatic disease. Stable small right inguinal hernia containing small bowel.  CT A/P W  Contrast 12/07/16 IMPRESSION: 1. Abnormal dilatation of the proximal small bowel loops worrisome for either small bowel obstruction versus small bowel ileus. 2. 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 3. Large right inguinal hernia containing edematous loop of distal small bowel 4. No pneumatosis or bowel perforation.  No abscess. 5. Aortic atherosclerosis 6. Mild decrease in size of rectal mass. No change in right common iliac adenopathy.  DG Chest 2 View 12/03/2016 IMPRESSION: 1. No active cardiopulmonary disease. No evidence of pneumonia or pulmonary edema. No evidence of intrathoracic metastasis. 2. Hyperexpanded lungs suggesting COPD.  MRI Pelvis 10/24/2016 IMPRESSION: 1. Moderately motion degraded exam. 2. Large mass is centered at the anus with extension into the low rectum. No complicating obstruction. 3. Mild mesorectal adenopathy, suspicious. 4. Right inguinal hernia containing nonobstructive small bowel.  CT CAP w Contrast 10/13/2016 IMPRESSION: Large irregular enhancing rectal mass consistent with known rectal cancer. There are small perirectal and small pelvic sidewall lymph nodes which are suspicious. Prominent perirectal vessels are also noted. No evidence of metastatic retroperitoneal or sigmoid  mesocolon adenopathy or hepatic or pulmonary metastasis.  COLONOSCOPY 10/09/2016 Dr. Hilarie Fredrickson  A fungating, infiltrative and ulcerated partially obstructing large mass was found in the distal rectum. The mass was circumferential. The mass measured seven cm in length (prolapsing and from dentate line to approx 7 cm). In addition, its inner diameter measured nine mm. Oozing was present. This was biopsied with a cold forceps for histology. - Due to the distal rectal mass the bowel preparation was poor and unable to be cleared via the adult upper endoscope (adult upper endoscope used due to obstructing mass). Scope not advanced proximal to the rectosigmoid colon.  ASSESSMENT & PLAN: 62 y.o.  gentleman, previously healthy, presented with a anal mass and a mild intermittent rectal bleeding for 5-6 months. Fatigue, anorexia, fatigue and weight loss for a month. He is currently on radiation for his rectal cancer, chemotherapy has been held lately.  1. Rectal Adenocarcinoma, low rectum, [id number], ypT3N0, stage IIIB, MSI-stable, node metastasis 09/2017  -I previously reviewed his CT and MRI scan, colonoscopy and biopsy findings in detail with pt and his family members -Based on his imaging findings, he had clinical stage III disease. We reviewed his image in our GI tumor Board, he also has a small liver lesion which is indeterminate, likely benign, and we will follow up with a repeated CT in about 3 months.  -patient received neoadjuvant chemotherapy and radiation with Xeloda, which was held for the last few weeks due to infectious colitis. -He underwent APR on 02/21/2017, surgical margins were negative, all lymph nodes were negative. We've reviewed his surgical pathology findings he did have significant treatment effect on his surgical sample.  - We discussed the role of adjuvant chemotherapy. Given his stage III B disease, I recommend adjuvant FOLFOX or CAPOX. The patient opted CAPOX, which is every 3 weeks,  for total 6 cycles.  -He recovered well from surgery, started adjuvant CAPOX 04/04/17. I decreased his first cycle of oxaliplatin platinum by 15%, he tolerated well, so we increased to full dose from cycle 2. He completed 5 cycles of oxaliplatin on 07/16/17 and completed Xeloda on 08/12/17. His oxaliplatin treatment was postponed a few times due to his working schedule -He had repeated colonoscopy by Dr. Hilarie Fredrickson in 07/2017 which was negative  -We discussed his CT CAP from 09/18/17 which shows multiple slightly enlarged RP lymph  nodes and a 4 mm lung nodule in the left lower lobe. The enlargement of lymph nodes is possible reactive, or metastatic recurrent colon cancer.  He is clinically doing well, asymmetric, lab reviewed, CEA has been slightly elevated since his surgery. I suggest getting a PET scan in 4 weeks.  -PET Scan from 10/17/17 reveals increased hypermetabolic lymphadenopathy in abdominal retroperitoneum, right common iliac chain, and left supraclavicular region, consistent with metastatic disease and stable tiny bilateral pulmonary nodules show no FDG uptake but are too small to characterize by PET. I discussed results with pt and his family. I highly suspect that this is metastatic disease. He will get a biopsy to confirm this.  -He underwent left supraclavicular lymph node biopsy yesterday.  I have spoken with the pathologist, metastatic disease is confirmed.  Immunostains still pending to confirm the origin.  It is likely metastatic rectal cancer. -I discussed the treatment option.  Given his rapid  Recurrence (2 months) after completion of adjuvant Capox, I recommend FOLFIRI as his next line therapy.  I will order foundation one test on his node biopsy, to see if he is a candidate for EGFR inhibitor, or other targeted therapy.  His tumor has MSI stable, not a candidate for first-line immunotherapy. --Chemotherapy consent: Side effects including but does not not limited to, fatigue, nausea, vomiting,  diarrhea, hair loss, neuropathy, fluid retention, renal and kidney dysfunction, neutropenic fever, needed for blood transfusion, bleeding, were discussed with patient in great detail. He agrees to proceed. -The goal of therapy is palliative, to prolong his life and preserve his quality of life.  Discussed his disease is unlikely curable at this stage, due to the diffuse node metastasis. -Patient would like to get a second opinion at [facility], I encouraged him to get appointment as soon as possible, and I will assist him if needed.  -I encouraged him to consider clinical trial if it is available at [facility].   2. 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 -Hemoglobin was 8.3 initially with low MCV, likely anemia of iron deficiency from chronic blood loss. This was confirmed by his iron study. 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 -he took some oral iron also -will monitor his CBC and iron level closely.  3. Peripheral neuropathy, secondary to chemo, G1 -mild numbness, no decreased sensation on exam, hand function is normal  -He has slightly more noticeable neuropathy, but hand functions are normal and adequate for treatment -continue monitoring closely  -He takes B complex and his neuropathy has improved.   4.Goal of care discussion  -We again discussed the incurable nature of his cancer, and the overall poor prognosis due to the metastatic disease, especially if he does not have good response to chemotherapy or progress on chemo -The patient understands the goal of care is palliative, to prolong his life. -he is  full code for now    Plan  -Port placement by Dr. Johney Maine or IR next week -Foundation one will be ordered on his recent node biopsy sample -He will see Dr. Truman Hayward at [facility] for second opinion -Plan to start chemotherapy FOLFIRI after his visit at [facility], unless he  will take treatments or participate clinical trial there.  -I have also contacted Dr. Reynaldo Minium at the [facility] for the clinical trial options, they do not have specific trial available for him now.   All questions were answered. The patient knows to call the clinic with any problems, questions or concerns.  I spent 30 minutes counseling the patient face to face. The total time spent in the appointment was 40 minutes and more than 50% was on counseling.   Truitt Merle, MD 10/30/2017 5:58 PM

## 2017-10-26 NOTE — Telephone Encounter (Signed)
Left message for patient regarding appointment for 3/5 per schd msg from Dr Burr Medico 3/1.  I also asked patient to call back and confirm Date/Time.

## 2017-10-29 ENCOUNTER — Ambulatory Visit (HOSPITAL_COMMUNITY)
Admission: RE | Admit: 2017-10-29 | Discharge: 2017-10-29 | Disposition: A | Discharge status: Home / Self Care | Payer: BLUE CROSS/BLUE SHIELD | Source: Ambulatory Visit | Attending: Hematology | Admitting: Hematology

## 2017-10-29 DIAGNOSIS — R590 Localized enlarged lymph nodes: Secondary | ICD-10-CM | POA: Insufficient documentation

## 2017-10-29 DIAGNOSIS — Z87891 Personal history of nicotine dependence: Secondary | ICD-10-CM | POA: Diagnosis not present

## 2017-10-29 DIAGNOSIS — Z85048 Personal history of other malignant neoplasm of rectum, rectosigmoid junction, and anus: Secondary | ICD-10-CM | POA: Insufficient documentation

## 2017-10-29 DIAGNOSIS — K4090 Unilateral inguinal hernia, without obstruction or gangrene, not specified as recurrent: Secondary | ICD-10-CM | POA: Insufficient documentation

## 2017-10-29 DIAGNOSIS — C779 Secondary and unspecified malignant neoplasm of lymph node, unspecified: Secondary | ICD-10-CM | POA: Insufficient documentation

## 2017-10-29 DIAGNOSIS — Z933 Colostomy status: Secondary | ICD-10-CM | POA: Diagnosis not present

## 2017-10-29 DIAGNOSIS — R918 Other nonspecific abnormal finding of lung field: Secondary | ICD-10-CM | POA: Insufficient documentation

## 2017-10-29 DIAGNOSIS — C20 Malignant neoplasm of rectum: Secondary | ICD-10-CM

## 2017-10-29 LAB — CBC
HEMATOCRIT: 40.6 % (ref 39.0–52.0)
HEMOGLOBIN: 13.9 g/dL (ref 13.0–17.0)
MCH: 33.5 pg (ref 26.0–34.0)
MCHC: 34.2 g/dL (ref 30.0–36.0)
MCV: 97.8 fL (ref 78.0–100.0)
Platelets: 266 10*3/uL (ref 150–400)
RBC: 4.15 MIL/uL — AB (ref 4.22–5.81)
RDW: 12.9 % (ref 11.5–15.5)
WBC: 5.9 10*3/uL (ref 4.0–10.5)

## 2017-10-29 LAB — PROTIME-INR
INR: 1.04
Prothrombin Time: 13.5 seconds (ref 11.4–15.2)

## 2017-10-29 MED ORDER — SODIUM CHLORIDE 0.9 % IV SOLN: INTRAVENOUS | Status: DC

## 2017-10-29 MED ORDER — LIDOCAINE HCL (PF) 1 % IJ SOLN
INTRAMUSCULAR | Status: AC
  Filled 2017-10-29: qty 30

## 2017-10-29 MED ORDER — MIDAZOLAM HCL 2 MG/2ML IJ SOLN
INTRAMUSCULAR | Status: AC
  Filled 2017-10-29: qty 2

## 2017-10-29 MED ORDER — HYDROCODONE-ACETAMINOPHEN 5-325 MG PO TABS: 1.0000 | ORAL_TABLET | ORAL | Status: DC | PRN

## 2017-10-29 MED ORDER — FENTANYL CITRATE (PF) 100 MCG/2ML IJ SOLN
INTRAMUSCULAR | Status: AC
  Filled 2017-10-29: qty 2

## 2017-10-29 MED ORDER — FENTANYL CITRATE (PF) 100 MCG/2ML IJ SOLN
INTRAMUSCULAR | Status: AC | PRN
  Administered 2017-10-29: 50 ug via INTRAVENOUS

## 2017-10-29 MED ORDER — MIDAZOLAM HCL 2 MG/2ML IJ SOLN
INTRAMUSCULAR | Status: AC | PRN
  Administered 2017-10-29: 1 mg via INTRAVENOUS

## 2017-10-29 NOTE — Discharge Instructions (Addendum)

## 2017-10-29 NOTE — Sedation Documentation (Signed)
Patient is resting comfortably. 

## 2017-10-29 NOTE — Sedation Documentation (Signed)
Patient denies pain and is resting comfortably.  

## 2017-10-29 NOTE — Procedures (Signed)
US guided core biopsies of left supraclavicular lymph node.  5 cores obtained.  Minimal blood loss and no immediate complication.

## 2017-10-29 NOTE — H&P (Signed)
Chief Complaint: Patient was seen in consultation today for LN biopsy at the request of Feng,Yan  Referring Physician(s): Feng,Yan  Supervising Physician: Markus Daft  Patient Status: [facility] - Out-pt  History of Present Illness: David Clarke is a 62 y.o. male with hx of rectal cancer. He now has hypermetabolic adenopathy on PET scan,. He is referred for biopsy to eval for metastatic disease. PMHx, meds, labs, imaging reviewed. Has been NPO this am. Feels well otherwise.  Past Medical History:  Diagnosis Date  . Allergy   . Cancer ([place]) 10/09/2016   rectal adenocarcinoma  . History of hiatal hernia     Past Surgical History:  Procedure Laterality Date  . COLONOSCOPY W/ BIOPSIES Bilateral 10/09/2016   rectal adenocarcinoma  . MOUTH SURGERY     age 89, to remove extra "eye" teeth  . [place] teeth extraction    . XI ROBOT ABDOMINAL PERINEAL RESECTION N/A 02/21/2017   Procedure: XI ROBOT ABDOMINOPERINEAL RESECTION WITH COLOSTOMY;  Surgeon: Michael Boston, MD;  Location: [facility] ORS;  Service: General;  Laterality: N/A;    Allergies: Sulfa antibiotics  Medications: Prior to Admission medications   Medication Sig Start Date End Date Taking? Authorizing Provider  Cyanocobalamin (VITAMIN B 12 PO) Take 1 tablet by mouth daily.   Yes [provider]  Multiple Vitamin (MULTIVITAMIN WITH MINERALS) TABS tablet Take 1 tablet by mouth every other day.    Yes [provider]     Family History  Problem Relation Age of Onset  . Other Mother        bile duct tumor  . Stroke Mother   . Cancer Mother 13       bile duct cancer   . Prostate cancer Father 76  . Emphysema Father   . Colon cancer Neg Hx   . Esophageal cancer Neg Hx   . Pancreatic cancer Neg Hx   . Rectal cancer Neg Hx   . Stomach cancer Neg Hx     Social History   Socioeconomic History  . Marital status: Single    Spouse name: Not on file  . Number of children: 1  . Years of education: Not on  file  . Highest education level: Not on file  Social Needs  . Financial resource strain: Not on file  . Food insecurity - worry: Not on file  . Food insecurity - inability: Not on file  . Transportation needs - medical: Not on file  . Transportation needs - non-medical: Not on file  Occupational History  . Occupation: Architectural technologist  Tobacco Use  . Smoking status: Former Smoker    Years: 2.00    Types: Cigarettes    Last attempt to quit: 2002    Years since quitting: 17.1  . Smokeless tobacco: Never Used  Substance and Sexual Activity  . Alcohol use: Yes    Alcohol/week: 6.0 oz    Types: 4 Glasses of wine, 6 Cans of beer per week    Comment: beer or a glass or wine most days of the week  . Drug use: No  . Sexual activity: Yes    Partners: Female  Other Topics Concern  . Not on file  Social History Narrative  . Not on file     Review of Systems: A 12 point ROS discussed and pertinent positives are indicated in the HPI above.  All other systems are negative.  Review of Systems  Vital Signs: BP (!) 149/85 (BP Location: Left Arm)  Pulse 63   Temp 98.2 F (36.8 C)   Resp 17   Ht 6' (1.829 m)   Wt 155 lb (70.3 kg)   SpO2 100%   BMI 21.02 kg/m   Physical Exam  Constitutional: He is oriented to person, place, and time. He appears well-developed. No distress.  HENT:  Head: Normocephalic.  Mouth/Throat: Oropharynx is clear and moist.  Neck: Normal range of motion.  Palpable (L)[place] LN  Cardiovascular: Normal rate, regular rhythm and normal heart sounds.  Pulmonary/Chest: Effort normal and breath sounds normal. No respiratory distress.  Neurological: He is alert and oriented to person, place, and time.  Skin: Skin is warm and dry.  Psychiatric: He has a normal mood and affect.      Imaging: Nm Pet Image Initial (pi) Skull Base To Thigh  Result Date: 10/17/2017 CLINICAL DATA:  Subsequent treatment strategy for rectal adenocarcinoma. Status post AP resection,  chemotherapy, and radiation therapy. EXAM: NUCLEAR MEDICINE PET SKULL BASE TO THIGH TECHNIQUE: 8.0 mCi F-18 FDG was injected intravenously. Full-ring PET imaging was performed from the skull base to thigh after the radiotracer. CT data was obtained and used for attenuation correction and anatomic localization. FASTING BLOOD GLUCOSE:  Value: 103 mg/dl COMPARISON:  CT on 09/18/2017 FINDINGS: NECK: A 2.0 cm hypermetabolic left supraclavicular lymph node is seen, with SUV max of 9.2. No other hypermetabolic lymph nodes or masses identified. CHEST: No hypermetabolic masses or lymphadenopathy. 4 mm pulmonary nodules in the bilateral upper and left lower lobes are stable. The show no FDG uptake but are too small to characterize by PET. ABDOMEN/PELVIS: No abnormal hypermetabolic activity within the liver, pancreas, adrenal glands, or spleen. Hypermetabolic abdominal retroperitoneal lymphadenopathy is seen. Index lymph node in the aortocaval space measures 2.5 cm on image 127/4 compared to 1.8 cm previously. This has SUV max of 16.4. Increased lymphadenopathy is also seen in the right common iliac chain with involvement of the right psoas muscle. This measures 3.9 x 3.0 cm on image 150/4 compared to 2.3 x 2.0 cm previously. This has SUV max of 13.7. 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 Ill-defined presacral soft tissue density is stable and shows low level FDG uptake with SUV max of 3.3. This is consistent with post treatment changes. Decrease size of right inguinal hernia since prior study containing a single small bowel loop. Stable postop changes from AP resection and left lower quadrant colostomy. SKELETON: No focal hypermetabolic bone lesions to suggest skeletal metastasis. IMPRESSION: Increased hypermetabolic lymphadenopathy in abdominal retroperitoneum, right common iliac chain, and left supraclavicular region, consistent with metastatic disease. Stable tiny bilateral  pulmonary nodules show no FDG uptake but are too small to characterize by PET. Recommend continued attention on follow-up CT. 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 Electronically Signed   By: Earle Gell M.D.   On: 10/17/2017 14:04    Labs:  CBC: Recent Labs    07/16/17 1323 08/10/17 0829 09/18/17 1059 10/29/17 1245  WBC 4.1 3.4* 5.7 5.9  HGB 12.5* 12.3* 14.4 13.9  HCT 37.8* 37.1* 42.8 40.6  PLT 160 123* 232 266    COAGS: Recent Labs    12/03/16 1135 10/29/17 1245  INR 1.22 1.04    BMP: Recent Labs    12/09/16 0617  02/19/17 1335 02/22/17 0409  06/19/17 0825 07/16/17 1323 08/10/17 0829 09/18/17 1059  NA 133*   < > 137 138   < > 138 137 137 139  K 3.4*   < >  3.9 4.2   < > 4.0 3.6 3.5 4.2  CL 107  --  105 105  --   --   --   --  104  CO2 21*   < > 24 27   < > 24 22 21* 27  GLUCOSE 122*   < > 98 111*   < > 101 133 133 95  BUN <5*   < > 14 10   < > 12.1 12.0 16.5 13  CALCIUM 7.5*   < > 8.7* 8.4*   < > 9.3 9.0 9.0 9.4  CREATININE 0.59*   < > 0.59* 0.72   < > 0.8 0.8 0.8 0.85  GFRNONAA >60  --  >60 >60  --   --   --   --  >60  GFRAA >60  --  >60 >60  --   --   --   --  >60   < > = values in this interval not displayed.    LIVER FUNCTION TESTS: Recent Labs    06/19/17 0825 07/16/17 1323 08/10/17 0829 09/18/17 1059  BILITOT 0.69 0.66 1.32* 0.6  AST 31 42* 47* 34  ALT 24 39 38 29  ALKPHOS 117 141 137 140  PROT 7.4 7.5 7.1 7.8  ALBUMIN 3.5 3.5 3.5 3.8    TUMOR MARKERS: No results for input(s): AFPTM, CEA, CA199, CHROMGRNA in the last 8760 hours.  Assessment and Plan: Hx rectal cancer Hypermetabolic (L)supraclavicular LN For US guided biopsy Labs ok Risks and benefits discussed with the patient including, but not limited to bleeding, infection, damage to adjacent structures or low yield requiring additional tests.  All of the patient's questions were answered, patient is agreeable to proceed. Consent signed and in  chart.    Thank you for this interesting consult.  I greatly enjoyed meeting CRAIG IONESCU and look forward to participating in their care.  A copy of this report was sent to the requesting provider on this date.  Electronically Signed: Ascencion Dike, PA-C 10/29/2017, 1:53 PM   I spent a total of 20 minutes in face to face in clinical consultation, greater than 50% of which was counseling/coordinating care for LN biopsy

## 2017-10-30 ENCOUNTER — Inpatient Hospital Stay: Payer: BLUE CROSS/BLUE SHIELD

## 2017-10-30 ENCOUNTER — Encounter: Payer: Self-pay | Admitting: Hematology

## 2017-10-30 ENCOUNTER — Inpatient Hospital Stay: Payer: BLUE CROSS/BLUE SHIELD | Attending: Hematology | Admitting: Hematology

## 2017-10-30 VITALS — BP 143/93 | HR 68 | Temp 97.7°F | Resp 18 | Ht 72.0 in | Wt 155.6 lb

## 2017-10-30 DIAGNOSIS — C20 Malignant neoplasm of rectum: Secondary | ICD-10-CM | POA: Diagnosis present

## 2017-10-30 DIAGNOSIS — T451X5A Adverse effect of antineoplastic and immunosuppressive drugs, initial encounter: Secondary | ICD-10-CM

## 2017-10-30 DIAGNOSIS — Z5111 Encounter for antineoplastic chemotherapy: Secondary | ICD-10-CM | POA: Insufficient documentation

## 2017-10-30 DIAGNOSIS — D500 Iron deficiency anemia secondary to blood loss (chronic): Secondary | ICD-10-CM

## 2017-10-30 DIAGNOSIS — Z7189 Other specified counseling: Secondary | ICD-10-CM | POA: Insufficient documentation

## 2017-10-30 DIAGNOSIS — Z148 Genetic carrier of other disease: Secondary | ICD-10-CM | POA: Diagnosis not present

## 2017-10-30 DIAGNOSIS — G620 Drug-induced polyneuropathy: Secondary | ICD-10-CM | POA: Diagnosis not present

## 2017-10-30 LAB — COMPREHENSIVE METABOLIC PANEL
ALBUMIN: 3.8 g/dL (ref 3.5–5.0)
ALT: 17 U/L (ref 0–55)
AST: 24 U/L (ref 5–34)
Alkaline Phosphatase: 123 U/L (ref 40–150)
Anion gap: 10 (ref 3–11)
BUN: 13 mg/dL (ref 7–26)
CHLORIDE: 104 mmol/L (ref 98–109)
CO2: 23 mmol/L (ref 22–29)
Calcium: 9.6 mg/dL (ref 8.4–10.4)
Creatinine, Ser: 0.83 mg/dL (ref 0.70–1.30)
GFR calc Af Amer: >60 mL/min (ref >60)
GFR calc non Af Amer: >60 mL/min (ref >60)
GLUCOSE: 124 mg/dL (ref 70–140)
Potassium: 3.9 mmol/L (ref 3.5–5.1)
SODIUM: 137 mmol/L (ref 136–145)
Total Bilirubin: 0.5 mg/dL (ref 0.2–1.2)
Total Protein: 7.8 g/dL (ref 6.4–8.3)

## 2017-10-30 LAB — CBC WITH DIFFERENTIAL/PLATELET
Basophils Absolute: 0 10*3/uL (ref 0.0–0.1)
Basophils Relative: 1 %
EOS ABS: 0.1 10*3/uL (ref 0.0–0.5)
EOS PCT: 1 %
HCT: 42.3 % (ref 38.4–49.9)
Hemoglobin: 14.2 g/dL (ref 13.0–17.1)
LYMPHS ABS: 0.8 10*3/uL — AB (ref 0.9–3.3)
Lymphocytes Relative: 12 %
MCH: 33.4 pg (ref 27.2–33.4)
MCHC: 33.6 g/dL (ref 32.0–36.0)
MCV: 99.5 fL — AB (ref 79.3–98.0)
MONOS PCT: 7 %
Monocytes Absolute: 0.5 10*3/uL (ref 0.1–0.9)
Neutro Abs: 5.2 10*3/uL (ref 1.5–6.5)
Neutrophils Relative %: 79 %
PLATELETS: 261 10*3/uL (ref 140–400)
RBC: 4.25 MIL/uL (ref 4.20–5.82)
RDW: 13.2 % (ref 11.0–14.6)
WBC: 6.6 10*3/uL (ref 4.0–10.3)

## 2017-10-30 LAB — CEA (IN HOUSE-CHCC): CEA (CHCC-IN HOUSE): 8.11 ng/mL — AB (ref 0.00–5.00)

## 2017-10-30 LAB — IRON AND TIBC
Iron: 67 ug/dL (ref 42–163)
Saturation Ratios: 22 % — ABNORMAL LOW (ref 42–163)
TIBC: 304 ug/dL (ref 202–409)
UIBC: 237 ug/dL

## 2017-10-30 LAB — FERRITIN: FERRITIN: 108 ng/mL (ref 22–316)

## 2017-10-30 MED ORDER — LIDOCAINE 5 % EX PTCH: 1.0000 | MEDICATED_PATCH | CUTANEOUS | 0 refills | Status: DC

## 2017-10-30 NOTE — Progress Notes (Signed)
ON PATHWAY REGIMEN - Colorectal  No Change  Continue With Treatment as Ordered.     A cycle is every 21 days:     Capecitabine      Oxaliplatin   **Always confirm dose/schedule in your pharmacy ordering system**    Patient Characteristics: Rectal Neoadjuvant/Adjuvant, T3 - T4, N0 or Any T, N+**, Postoperative - Had Neoadjuvant Therapy - Preoperative Node Positive Current evidence of distant metastases<= No AJCC T Category: T3 AJCC N Category: N1 AJCC M Category: M0 AJCC 8 Stage Grouping: IIIB Intent of Therapy: Curative Intent, Discussed with Patient

## 2017-10-30 NOTE — Progress Notes (Signed)
DISCONTINUE ON PATHWAY REGIMEN - Colorectal     A cycle is every 21 days:     Capecitabine      Oxaliplatin   **Always confirm dose/schedule in your pharmacy ordering system**    REASON: Disease Progression PRIOR TREATMENT: ROS57: Cape[place] q21 Days x 6 Cycles TREATMENT RESPONSE: Unable to Evaluate  START ON PATHWAY REGIMEN - Colorectal     A cycle is every 14 days:     Irinotecan      Leucovorin      5-Fluorouracil      5-Fluorouracil      Bevacizumab   **Always confirm dose/schedule in your pharmacy ordering system**    Patient Characteristics: Metastatic Colorectal, First Line, Nonsurgical Candidate, KRAS Mutation Positive/Unknown, BRAF Wild-Type/Unknown, PS = 0,1; Bevacizumab Eligible Current evidence of distant metastases<= Yes AJCC T Category: T3 AJCC N Category: N1 AJCC M Category: M1b AJCC 8 Stage Grouping: IVB BRAF Mutation Status: Awaiting Test Results KRAS/NRAS Mutation Status: Awaiting Test Results Line of therapy: First Line Would you be surprised if this patient died  in the next year<= I would be surprised if this patient died in the next year Performance Status: PS = 0, 1 Bevacizumab Eligibility: Eligible Intent of Therapy: Non-Curative / Palliative Intent, Discussed with Patient

## 2017-10-31 ENCOUNTER — Encounter: Payer: Self-pay | Admitting: Hematology

## 2017-10-31 ENCOUNTER — Ambulatory Visit: Payer: Self-pay | Admitting: Surgery

## 2017-10-31 NOTE — Progress Notes (Signed)
Received PA request for Lidocaine patch. Asked Thu RN clinical questions/answers.   Submitted through Cover My Meds:  RUSTON FEDORA (Key: [id number])   Your information has been submitted to [place]. [employer] [place] will review the request and fax you a determination directly, typically within 3 business days of your submission once all necessary information is received. If [employer] [place] has not responded in 3 business days or if you have any questions about your submission, contact [place] at [phone].

## 2017-11-01 ENCOUNTER — Telehealth: Payer: Self-pay | Admitting: Student in an Organized Health Care Education/Training Program

## 2017-11-01 ENCOUNTER — Encounter: Payer: Self-pay | Admitting: Hematology

## 2017-11-01 NOTE — Progress Notes (Signed)
Received PA determination for Lidocaine patch from  [place].  PA denied.  Denial given to Dr.Feng.

## 2017-11-01 NOTE — Telephone Encounter (Signed)
Appointments scheduled Calendar / Letter mailed to patient per 3/6 sch msg

## 2017-11-02 ENCOUNTER — Encounter (HOSPITAL_COMMUNITY): Payer: Self-pay | Admitting: *Deleted

## 2017-11-02 ENCOUNTER — Encounter: Payer: Self-pay | Admitting: Hematology

## 2017-11-02 ENCOUNTER — Other Ambulatory Visit: Payer: Self-pay

## 2017-11-02 NOTE — Progress Notes (Signed)
NO SOLID FOOD AFTER MIDNIGHT THE NIGHT PRIOR TO SURGERY. NOTHING BY MOUTH EXCEPT CLEAR LIQUIDS UNTIL 3 HOURS PRIOR TO [place] SURGERY. PLEASE FINISH ENSURE DRINK PER SURGEON ORDER 3 HOURS PRIOR TO SCHEDULED SURGERY TIME WHICH NEEDS TO BE COMPLETED AT  0800 am.   [facility] - Preparing for Surgery Before surgery, you can play an important role.  Because skin is not sterile, your skin needs to be as free of germs as possible.  You can reduce the number of germs on your skin by washing with CHG (chlorahexidine gluconate) soap before surgery.  CHG is an antiseptic cleaner which kills germs and bonds with the skin to continue killing germs even after washing. Please DO NOT use if you have an allergy to CHG or antibacterial soaps.  If your skin becomes reddened/irritated stop using the CHG and inform your nurse when you arrive at Short Stay. Do not shave (including legs and underarms) for at least 48 hours prior to the first CHG shower.  You may shave your face/neck. Please follow these instructions carefully:  1.  Shower with CHG Soap the night before surgery and the  morning of Surgery.  2.  If you choose to wash your hair, wash your hair first as usual with your  normal  shampoo.  3.  After you shampoo, rinse your hair and body thoroughly to remove the  shampoo.                           4.  Use CHG as you would any other liquid soap.  You can apply chg directly  to the skin and wash                       Gently with a scrungie or clean washcloth.  5.  Apply the CHG Soap to your body ONLY FROM THE NECK DOWN.   Do not use on face/ open                           Wound or open sores. Avoid contact with eyes, ears mouth and genitals (private parts).                       Wash face,  Genitals (private parts) with your normal soap.             6.  Wash thoroughly, paying special attention to the area where your surgery  will be performed.  7.  Thoroughly rinse your body with warm water from the neck  down.  8.  DO NOT shower/wash with your normal soap after using and rinsing off  the CHG Soap.                9.  Pat yourself dry with a clean towel.            10.  Wear clean pajamas.            11.  Place clean sheets on your bed the night of your first shower and do not  sleep with pets. Day of Surgery : Do not apply any lotions/deodorants the morning of surgery.  Please wear clean clothes to the hospital/surgery center.  Reviewed instructions via phone with patient with patient verbalizing understanding.

## 2017-11-07 ENCOUNTER — Other Ambulatory Visit: Payer: Self-pay | Admitting: *Deleted

## 2017-11-07 ENCOUNTER — Ambulatory Visit (HOSPITAL_COMMUNITY): Admission: RE | Admit: 2017-11-07 | Payer: BLUE CROSS/BLUE SHIELD | Source: Ambulatory Visit | Admitting: Surgery

## 2017-11-07 ENCOUNTER — Telehealth: Payer: Self-pay | Admitting: *Deleted

## 2017-11-07 DIAGNOSIS — C20 Malignant neoplasm of rectum: Secondary | ICD-10-CM

## 2017-11-07 HISTORY — DX: Unilateral inguinal hernia, without obstruction or gangrene, not specified as recurrent: K40.90

## 2017-11-07 SURGERY — INSERTION, TUNNELED CENTRAL VENOUS DEVICE, WITH PORT
Anesthesia: General

## 2017-11-07 NOTE — Telephone Encounter (Signed)
Left message with IR Scheduler to call back regarding PAC insertion ASAP.

## 2017-11-07 NOTE — Telephone Encounter (Addendum)
Called pt & he states that he has appt at [facility] for 2nd opinion on Mon 11/12/17 with Dr Sigurd Sos @ 10:30am & can't do port or chemo on that day.  Will discuss with Dr Burr Medico.  Called IR & r/s port to Tues 11/13/17 @ 1230 pm with Tiffany.  Message to scheduler to move chemo from Monday 3/18 to wed 11/14/17.

## 2017-11-08 ENCOUNTER — Encounter: Payer: Self-pay | Admitting: Hematology

## 2017-11-08 ENCOUNTER — Ambulatory Visit: Payer: BLUE CROSS/BLUE SHIELD | Admitting: Nurse Practitioner

## 2017-11-11 ENCOUNTER — Other Ambulatory Visit: Payer: Self-pay | Admitting: Radiology

## 2017-11-12 ENCOUNTER — Other Ambulatory Visit: Payer: BLUE CROSS/BLUE SHIELD

## 2017-11-12 ENCOUNTER — Telehealth: Payer: Self-pay | Admitting: Hematology

## 2017-11-12 ENCOUNTER — Ambulatory Visit: Payer: BLUE CROSS/BLUE SHIELD | Admitting: Nurse Practitioner

## 2017-11-12 ENCOUNTER — Other Ambulatory Visit (HOSPITAL_COMMUNITY): Payer: BLUE CROSS/BLUE SHIELD

## 2017-11-12 ENCOUNTER — Ambulatory Visit: Payer: BLUE CROSS/BLUE SHIELD

## 2017-11-12 NOTE — Telephone Encounter (Signed)
I called pt and discussed the clinical trial [id number] with pt, he received a consent from from Dr. Aleatha Borer this morning during his visit there this morning. He would like to think about it and let me in a few days if he decides to participate the trail. If he dose, will have him consented and blood draw this week, and postpone his chemo from 3/25 to 3/28, and hold Avastin for first cycle due to port placement tomorrow.   David Clarke  11/12/2017

## 2017-11-13 ENCOUNTER — Other Ambulatory Visit: Payer: Self-pay | Admitting: Hematology

## 2017-11-13 ENCOUNTER — Telehealth: Payer: Self-pay | Admitting: Hematology

## 2017-11-13 ENCOUNTER — Other Ambulatory Visit: Payer: BLUE CROSS/BLUE SHIELD

## 2017-11-13 ENCOUNTER — Ambulatory Visit (HOSPITAL_COMMUNITY)
Admission: RE | Admit: 2017-11-13 | Discharge: 2017-11-13 | Disposition: A | Discharge status: Home / Self Care | Payer: BLUE CROSS/BLUE SHIELD | Source: Ambulatory Visit | Attending: Hematology | Admitting: Hematology

## 2017-11-13 ENCOUNTER — Encounter (HOSPITAL_COMMUNITY): Payer: Self-pay

## 2017-11-13 ENCOUNTER — Ambulatory Visit: Payer: BLUE CROSS/BLUE SHIELD | Admitting: Nurse Practitioner

## 2017-11-13 DIAGNOSIS — C20 Malignant neoplasm of rectum: Secondary | ICD-10-CM

## 2017-11-13 DIAGNOSIS — Z87891 Personal history of nicotine dependence: Secondary | ICD-10-CM | POA: Diagnosis not present

## 2017-11-13 DIAGNOSIS — Z882 Allergy status to sulfonamides status: Secondary | ICD-10-CM | POA: Diagnosis not present

## 2017-11-13 DIAGNOSIS — Z933 Colostomy status: Secondary | ICD-10-CM | POA: Insufficient documentation

## 2017-11-13 DIAGNOSIS — C19 Malignant neoplasm of rectosigmoid junction: Secondary | ICD-10-CM | POA: Diagnosis not present

## 2017-11-13 HISTORY — PX: IR US GUIDE VASC ACCESS RIGHT: IMG2390

## 2017-11-13 HISTORY — PX: IR FLUORO GUIDE PORT INSERTION RIGHT: IMG5741

## 2017-11-13 LAB — CBC WITH DIFFERENTIAL/PLATELET
Basophils Absolute: 0 10*3/uL (ref 0.0–0.1)
Basophils Relative: 0 %
Eosinophils Absolute: 0 10*3/uL (ref 0.0–0.7)
Eosinophils Relative: 0 %
HEMATOCRIT: 41 % (ref 39.0–52.0)
HEMOGLOBIN: 14.2 g/dL (ref 13.0–17.0)
LYMPHS ABS: 0.6 10*3/uL — AB (ref 0.7–4.0)
Lymphocytes Relative: 9 %
MCH: 33.3 pg (ref 26.0–34.0)
MCHC: 34.6 g/dL (ref 30.0–36.0)
MCV: 96.2 fL (ref 78.0–100.0)
MONOS PCT: 8 %
Monocytes Absolute: 0.6 10*3/uL (ref 0.1–1.0)
NEUTROS ABS: 5.7 10*3/uL (ref 1.7–7.7)
NEUTROS PCT: 83 %
Platelets: 242 10*3/uL (ref 150–400)
RBC: 4.26 MIL/uL (ref 4.22–5.81)
RDW: 12.8 % (ref 11.5–15.5)
WBC: 7 10*3/uL (ref 4.0–10.5)

## 2017-11-13 LAB — PROTIME-INR
INR: 0.97
Prothrombin Time: 12.8 seconds (ref 11.4–15.2)

## 2017-11-13 MED ORDER — FENTANYL CITRATE (PF) 100 MCG/2ML IJ SOLN
INTRAMUSCULAR | Status: AC | PRN
  Administered 2017-11-13 (×2): 50 ug via INTRAVENOUS

## 2017-11-13 MED ORDER — MIDAZOLAM HCL 2 MG/2ML IJ SOLN
INTRAMUSCULAR | Status: AC
  Filled 2017-11-13: qty 6

## 2017-11-13 MED ORDER — HEPARIN SOD (PORK) LOCK FLUSH 100 UNIT/ML IV SOLN
INTRAVENOUS | Status: AC
  Filled 2017-11-13: qty 5

## 2017-11-13 MED ORDER — HEPARIN SOD (PORK) LOCK FLUSH 100 UNIT/ML IV SOLN
INTRAVENOUS | Status: AC | PRN
  Administered 2017-11-13: 500 [IU] via INTRAVENOUS

## 2017-11-13 MED ORDER — LIDOCAINE-EPINEPHRINE (PF) 1 %-1:200000 IJ SOLN
INTRAMUSCULAR | Status: AC | PRN
  Administered 2017-11-13: 20 mL

## 2017-11-13 MED ORDER — LIDOCAINE HCL 1 % IJ SOLN
INTRAMUSCULAR | Status: AC
  Filled 2017-11-13: qty 20

## 2017-11-13 MED ORDER — MIDAZOLAM HCL 2 MG/2ML IJ SOLN
INTRAMUSCULAR | Status: AC | PRN
  Administered 2017-11-13: 1 mg via INTRAVENOUS
  Administered 2017-11-13: 2 mg via INTRAVENOUS

## 2017-11-13 MED ORDER — LIDOCAINE HCL (PF) 1 % IJ SOLN
INTRAMUSCULAR | Status: AC | PRN
  Administered 2017-11-13: 5 mL

## 2017-11-13 MED ORDER — CEFAZOLIN SODIUM-DEXTROSE 2-4 GM/100ML-% IV SOLN
INTRAVENOUS | Status: AC
  Filled 2017-11-13: qty 100

## 2017-11-13 MED ORDER — CEFAZOLIN SODIUM-DEXTROSE 2-4 GM/100ML-% IV SOLN
2.0000 g | INTRAVENOUS | Status: AC
  Administered 2017-11-13: 2 g via INTRAVENOUS

## 2017-11-13 MED ORDER — SODIUM CHLORIDE 0.9 % IV SOLN
INTRAVENOUS | Status: DC
  Administered 2017-11-13: 13:00:00 via INTRAVENOUS

## 2017-11-13 MED ORDER — FENTANYL CITRATE (PF) 100 MCG/2ML IJ SOLN
INTRAMUSCULAR | Status: AC
  Filled 2017-11-13: qty 2

## 2017-11-13 NOTE — Discharge Instructions (Signed)
Implanted Port Insertion, Care After °This sheet gives you information about how to care for yourself after your procedure. Your health care provider may also give you more specific instructions. If you have problems or questions, contact your health care provider. °What can I expect after the procedure? °After your procedure, it is common to have: °· Discomfort at the port insertion site. °· Bruising on the skin over the port. This should improve over 3-4 days. ° °Follow these instructions at home: °Port care °· After your port is placed, you will get a manufacturer's information card. The card has information about your port. Keep this card with you at all times. °· Take care of the port as told by your health care provider. Ask your health care provider if you or a family member can get training for taking care of the port at home. A home health care nurse may also take care of the port. °· Make sure to remember what type of port you have. °Incision care °· Follow instructions from your health care provider about how to take care of your port insertion site. Make sure you: °? Wash your hands with soap and water before you change your bandage (dressing). If soap and water are not available, use hand sanitizer. °? Change your dressing as told by your health care provider. °? Leave stitches (sutures), skin glue, or adhesive strips in place. These skin closures may need to stay in place for 2 weeks or longer. If adhesive strip edges start to loosen and curl up, you may trim the loose edges. Do not remove adhesive strips completely unless your health care provider tells you to do that. °· Check your port insertion site every day for signs of infection. Check for: °? More redness, swelling, or pain. °? More fluid or blood. °? Warmth. °? Pus or a bad smell. °General instructions °· Do not take baths, swim, or use a hot tub until your health care provider approves. °· Do not lift anything that is heavier than 10 lb (4.5  kg) for a week, or as told by your health care provider. °· Ask your health care provider when it is okay to: °? Return to work or school. °? Resume usual physical activities or sports. °· Do not drive for 24 hours if you were given a medicine to help you relax (sedative). °· Take over-the-counter and prescription medicines only as told by your health care provider. °· Wear a medical alert bracelet in case of an emergency. This will tell any health care providers that you have a port. °· Keep all follow-up visits as told by your health care provider. This is important. °Contact a health care provider if: °· You cannot flush your port with saline as directed, or you cannot draw blood from the port. °· You have a fever or chills. °· You have more redness, swelling, or pain around your port insertion site. °· You have more fluid or blood coming from your port insertion site. °· Your port insertion site feels warm to the touch. °· You have pus or a bad smell coming from the port insertion site. °Get help right away if: °· You have chest pain or shortness of breath. °· You have bleeding from your port that you cannot control. °Summary °· Take care of the port as told by your health care provider. °· Change your dressing as told by your health care provider. °· Keep all follow-up visits as told by your health care provider. °  This information is not intended to replace advice given to you by your health care provider. Make sure you discuss any questions you have with your health care provider. °Document Released: 06/04/2013 Document Revised: 07/05/2016 Document Reviewed: 07/05/2016 °Elsevier Interactive Patient Education © 2017 [employer] °Implanted Port Home Guide °An implanted port is a type of central line that is placed under the skin. Central lines are used to provide IV access when treatment or nutrition needs to be given through a person’s veins. Implanted ports are used for long-term IV access. An implanted port  may be placed because: °· You need IV medicine that would be irritating to the small veins in your hands or arms. °· You need long-term IV medicines, such as antibiotics. °· You need IV nutrition for a long period. °· You need frequent blood draws for lab tests. °· You need dialysis. ° °Implanted ports are usually placed in the chest area, but they can also be placed in the upper arm, the abdomen, or the leg. An implanted port has two main parts: °· Reservoir. The reservoir is round and will appear as a small, raised area under your skin. The reservoir is the part where a needle is inserted to give medicines or draw blood. °· Catheter. The catheter is a thin, flexible tube that extends from the reservoir. The catheter is placed into a large vein. Medicine that is inserted into the reservoir goes into the catheter and then into the vein. ° °How will I care for my incision site? °Do not get the incision site wet. Bathe or shower as directed by your health care provider. °How is my port accessed? °Special steps must be taken to access the port: °· Before the port is accessed, a numbing cream can be placed on the skin. This helps numb the skin over the port site. °· Your health care provider uses a sterile technique to access the port. °? Your health care provider must put on a mask and sterile gloves. °? The skin over your port is cleaned carefully with an antiseptic and allowed to dry. °? The port is gently pinched between sterile gloves, and a needle is inserted into the port. °· Only "non-coring" port needles should be used to access the port. Once the port is accessed, a blood return should be checked. This helps ensure that the port is in the vein and is not clogged. °· If your port needs to remain accessed for a constant infusion, a clear (transparent) bandage will be placed over the needle site. The bandage and needle will need to be changed every week, or as directed by your health care provider. °· Keep the  bandage covering the needle clean and dry. Do not get it wet. Follow your health care provider’s instructions on how to take a shower or bath while the port is accessed. °· If your port does not need to stay accessed, no bandage is needed over the port. ° °What is flushing? °Flushing helps keep the port from getting clogged. Follow your health care provider’s instructions on how and when to flush the port. Ports are usually flushed with saline solution or a medicine called heparin. The need for flushing will depend on how the port is used. °· If the port is used for intermittent medicines or blood draws, the port will need to be flushed: °? After medicines have been given. °? After blood has been drawn. °? As part of routine maintenance. °· If a constant infusion is   running, the port may not need to be flushed. ° °How long will my port stay implanted? °The port can stay in for as long as your health care provider thinks it is needed. When it is time for the port to come out, surgery will be done to remove it. The procedure is similar to the one performed when the port was put in. °When should I seek immediate medical care? °When you have an implanted port, you should seek immediate medical care if: °· You notice a bad smell coming from the incision site. °· You have swelling, redness, or drainage at the incision site. °· You have more swelling or pain at the port site or the surrounding area. °· You have a fever that is not controlled with medicine. ° °This information is not intended to replace advice given to you by your health care provider. Make sure you discuss any questions you have with your health care provider. °Document Released: 08/14/2005 Document Revised: 01/20/2016 Document Reviewed: 04/21/2013 °Elsevier Interactive Patient Education © 2017 [employer] °Moderate Conscious Sedation, Adult, Care After °These instructions provide you with information about caring for yourself after your procedure. Your  health care provider may also give you more specific instructions. Your treatment has been planned according to current medical practices, but problems sometimes occur. Call your health care provider if you have any problems or questions after your procedure. °What can I expect after the procedure? °After your procedure, it is common: °· To feel sleepy for several hours. °· To feel clumsy and have poor balance for several hours. °· To have poor judgment for several hours. °· To vomit if you eat too soon. ° °Follow these instructions at home: °For at least 24 hours after the procedure: ° °· Do not: °? Participate in activities where you could fall or become injured. °? Drive. °? Use heavy machinery. °? Drink alcohol. °? Take sleeping pills or medicines that cause drowsiness. °? Make important decisions or sign legal documents. °? Take care of children on your own. °· Rest. °Eating and drinking °· Follow the diet recommended by your health care provider. °· If you vomit: °? Drink water, juice, or soup when you can drink without vomiting. °? Make sure you have little or no nausea before eating solid foods. °General instructions °· Have a responsible adult stay with you until you are awake and alert. °· Take over-the-counter and prescription medicines only as told by your health care provider. °· If you smoke, do not smoke without supervision. °· Keep all follow-up visits as told by your health care provider. This is important. °Contact a health care provider if: °· You keep feeling nauseous or you keep vomiting. °· You feel light-headed. °· You develop a rash. °· You have a fever. °Get help right away if: °· You have trouble breathing. °This information is not intended to replace advice given to you by your health care provider. Make sure you discuss any questions you have with your health care provider. °Document Released: 06/04/2013 Document Revised: 01/17/2016 Document Reviewed: 12/04/2015 °Elsevier Interactive  Patient Education © 2018 Elsevier Inc. ° °

## 2017-11-13 NOTE — Telephone Encounter (Signed)
Left message on voicemail for patient regarding rescheduling appointments per 3/14 sch msg

## 2017-11-13 NOTE — Telephone Encounter (Signed)
I called pt and informed that he does not meet the inclusion criteria for the [id number] trial, so it's not an option for him. He agrees to start chemo as soon as possible. He is currently scheduled for next Wednesday, he will call our scheduler to see if he can move up to next Monday or Tuesday.  Truitt Merle  11/13/2017

## 2017-11-13 NOTE — H&P (Signed)
Chief Complaint: Patient was seen in consultation today for LN biopsy at the request of Feng,Yan  Referring Physician(s): Feng,Yan  Supervising Physician: Dr. Daryll Brod  Patient Status: [facility] - Out-pt  History of Present Illness: David Clarke is a 62 y.o. male recently diagnosed with metastatic colorectal cancer. He is referred for port placement today. PMHx, meds, labs, imaging reviewed. Has been NPO this am. Feels well, no recent fevers, chills, illness. Wife and daughter at bedside  Past Medical History:  Diagnosis Date  . Allergy   . Cancer ([place]) 10/09/2016   rectal adenocarcinoma  . Inguinal hernia     Past Surgical History:  Procedure Laterality Date  . COLONOSCOPY W/ BIOPSIES Bilateral 10/09/2016   rectal adenocarcinoma  . MOUTH SURGERY     age 73, to remove extra "eye" teeth  . [place] teeth extraction    . XI ROBOT ABDOMINAL PERINEAL RESECTION N/A 02/21/2017   Procedure: XI ROBOT ABDOMINOPERINEAL RESECTION WITH COLOSTOMY;  Surgeon: Michael Boston, MD;  Location: [facility] ORS;  Service: General;  Laterality: N/A;    Allergies: Sulfa antibiotics  Medications: Prior to Admission medications   Medication Sig Start Date End Date Taking? Authorizing Provider  Cyanocobalamin (VITAMIN B 12 PO) Take 1 tablet by mouth daily.   Yes [provider]  Multiple Vitamin (MULTIVITAMIN WITH MINERALS) TABS tablet Take 1 tablet by mouth every other day.    Yes [provider]     Family History  Problem Relation Age of Onset  . Other Mother        bile duct tumor  . Stroke Mother   . Cancer Mother 25       bile duct cancer   . Prostate cancer Father 24  . Emphysema Father   . Colon cancer Neg Hx   . Esophageal cancer Neg Hx   . Pancreatic cancer Neg Hx   . Rectal cancer Neg Hx   . Stomach cancer Neg Hx     Social History   Socioeconomic History  . Marital status: Single    Spouse name: None  . Number of children: 1  . Years of education: None   . Highest education level: None  Social Needs  . Financial resource strain: None  . Food insecurity - worry: None  . Food insecurity - inability: None  . Transportation needs - medical: None  . Transportation needs - non-medical: None  Occupational History  . Occupation: Architectural technologist  Tobacco Use  . Smoking status: Former Smoker    Years: 2.00    Types: Cigarettes    Last attempt to quit: 2002    Years since quitting: 17.2  . Smokeless tobacco: Never Used  Substance and Sexual Activity  . Alcohol use: Yes    Alcohol/week: 6.0 oz    Types: 4 Glasses of wine, 6 Cans of beer per week    Comment: beer or a glass or wine most days of the week  . Drug use: No  . Sexual activity: Yes    Partners: Female  Other Topics Concern  . None  Social History Narrative  . None     Review of Systems: A 12 point ROS discussed and pertinent positives are indicated in the HPI above.  All other systems are negative.  Review of Systems  Vital Signs: T: 98.3, RR: 16, HR: 70, BP: 140/93  Physical Exam  Constitutional: He is oriented to person, place, and time. He appears well-developed. No distress.  HENT:  Head: Normocephalic.  Mouth/Throat: Oropharynx is clear and moist.  Neck: Normal range of motion.  Palpable (L)Ovid LN  Cardiovascular: Normal rate, regular rhythm and normal heart sounds.  Pulmonary/Chest: Effort normal and breath sounds normal. No respiratory distress.  Neurological: He is alert and oriented to person, place, and time.  Skin: Skin is warm and dry.  Psychiatric: He has a normal mood and affect.      Imaging: Nm Pet Image Initial (pi) Skull Base To Thigh  Result Date: 10/17/2017 CLINICAL DATA:  Subsequent treatment strategy for rectal adenocarcinoma. Status post AP resection, chemotherapy, and radiation therapy. EXAM: NUCLEAR MEDICINE PET SKULL BASE TO THIGH TECHNIQUE: 8.0 mCi F-18 FDG was injected intravenously. Full-ring PET imaging was performed from the  skull base to thigh after the radiotracer. CT data was obtained and used for attenuation correction and anatomic localization. FASTING BLOOD GLUCOSE:  Value: 103 mg/dl COMPARISON:  CT on 09/18/2017 FINDINGS: NECK: A 2.0 cm hypermetabolic left supraclavicular lymph node is seen, with SUV max of 9.2. No other hypermetabolic lymph nodes or masses identified. CHEST: No hypermetabolic masses or lymphadenopathy. 4 mm pulmonary nodules in the bilateral upper and left lower lobes are stable. The show no FDG uptake but are too small to characterize by PET. ABDOMEN/PELVIS: No abnormal hypermetabolic activity within the liver, pancreas, adrenal glands, or spleen. Hypermetabolic abdominal retroperitoneal lymphadenopathy is seen. Index lymph node in the aortocaval space measures 2.5 cm on image 127/4 compared to 1.8 cm previously. This has SUV max of 16.4. Increased lymphadenopathy is also seen in the right common iliac chain with involvement of the right psoas muscle. This measures 3.9 x 3.0 cm on image 150/4 compared to 2.3 x 2.0 cm previously. This has SUV max of 13.7. 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 Ill-defined presacral soft tissue density is stable and shows low level FDG uptake with SUV max of 3.3. This is consistent with post treatment changes. Decrease size of right inguinal hernia since prior study containing a single small bowel loop. Stable postop changes from AP resection and left lower quadrant colostomy. SKELETON: No focal hypermetabolic bone lesions to suggest skeletal metastasis. IMPRESSION: Increased hypermetabolic lymphadenopathy in abdominal retroperitoneum, right common iliac chain, and left supraclavicular region, consistent with metastatic disease. Stable tiny bilateral pulmonary nodules show no FDG uptake but are too small to characterize by PET. Recommend continued attention on follow-up CT. Stable post treatment changes in presacral region and radiation  cystitis. Electronically Signed   By: Earle Gell M.D.   On: 10/17/2017 14:04   [place] Core Biopsy (lymph Nodes)  Result Date: 10/29/2017 INDICATION: 62 year old with history of rectal adenocarcinoma. Recent PET-CT demonstrated abnormal lymph nodes. Request for biopsy of the left supraclavicular lymph node. EXAM: ULTRASOUND-GUIDED BIOPSY OF LEFT SUPRACLAVICULAR LYMPH NODE MEDICATIONS: None. ANESTHESIA/SEDATION: Fentanyl 50 mcg IV; Versed 1.0 mg IV Moderate Sedation Time:  12 minutes The patient was continuously monitored during the procedure by the interventional radiology nurse under my direct supervision. PROCEDURE: The procedure was explained to the patient. The risks and benefits of the procedure were discussed and the patient's questions were addressed. Informed consent was obtained from the patient. Left side of the neck was evaluated with ultrasound. Abnormal lymph nodes in the left supraclavicular region were easily identified with ultrasound. Left side of the neck was prepped with chlorhexidine and sterile field was created. Skin was anesthetized with 1% lidocaine. 18 gauge core needle was directed into the largest abnormal lymph node with ultrasound guidance. A total of  5 core biopsies were obtained. Specimens placed in saline. Bandage placed over the puncture site. COMPLICATIONS: None immediate. FINDINGS: Small cluster of abnormal lymph nodes in left supraclavicular area just lateral to the left internal jugular vein. Core biopsy needle was identified within the largest lymph node during the biopsies. No significant bleeding or hematoma formation. IMPRESSION: Successful ultrasound-guided core biopsies of the abnormal left supraclavicular lymph node. Electronically Signed   By: Markus Daft M.D.   On: 10/29/2017 16:41    Labs:  CBC: Recent Labs    09/18/17 1059 10/29/17 1245 10/30/17 1132 11/13/17 1253  WBC 5.7 5.9 6.6 7.0  HGB 14.4 13.9 14.2 14.2  HCT 42.8 40.6 42.3 41.0  PLT 232 266 261 242      COAGS: Recent Labs    12/03/16 1135 10/29/17 1245 11/13/17 1253  INR 1.22 1.04 0.97    BMP: Recent Labs    02/19/17 1335 02/22/17 0409  07/16/17 1323 08/10/17 0829 09/18/17 1059 10/30/17 1132  NA 137 138   < > 137 137 139 137  K 3.9 4.2   < > 3.6 3.5 4.2 3.9  CL 105 105  --   --   --  104 104  CO2 24 27   < > 22 21* 27 23  GLUCOSE 98 111*   < > 133 133 95 124  BUN 14 10   < > 12.0 16.5 13 13   CALCIUM 8.7* 8.4*   < > 9.0 9.0 9.4 9.6  CREATININE 0.59* 0.72   < > 0.8 0.8 0.85 0.83  GFRNONAA >60 >60  --   --   --  >60 >60  GFRAA >60 >60  --   --   --  >60 >60   < > = values in this interval not displayed.    LIVER FUNCTION TESTS: Recent Labs    07/16/17 1323 08/10/17 0829 09/18/17 1059 10/30/17 1132  BILITOT 0.66 1.32* 0.6 0.5  AST 42* 47* 34 24  ALT 39 38 29 17  ALKPHOS 141 137 140 123  PROT 7.5 7.1 7.8 7.8  ALBUMIN 3.5 3.5 3.8 3.8    TUMOR MARKERS: No results for input(s): AFPTM, CEA, CA199, CHROMGRNA in the last 8760 hours.  Assessment and Plan: Metastatic colorectal cancer For port placement Labs ok Risks and benefits of image guided port-a-catheter placement was discussed with the patient including, but not limited to bleeding, infection, pneumothorax, or fibrin sheath development and need for additional procedures.  All of the patient's questions were answered, patient is agreeable to proceed. Consent signed and in chart.      Thank you for this interesting consult.  I greatly enjoyed meeting David Clarke and look forward to participating in their care.  A copy of this report was sent to the requesting provider on this date.  Electronically Signed: Ascencion Dike, PA-C 11/13/2017, 1:45 PM   I spent a total of 20 minutes in face to face in clinical consultation, greater than 50% of which was counseling/coordinating care for port placement

## 2017-11-13 NOTE — Procedures (Signed)
Met colon ca  S/p RT IJ POWER PORT  TIP SVCRA READY FOR USE EBL 0 NO COMP STABLE FULL REPORT IN PACS

## 2017-11-14 ENCOUNTER — Ambulatory Visit: Payer: BLUE CROSS/BLUE SHIELD

## 2017-11-14 ENCOUNTER — Encounter: Payer: Self-pay | Admitting: Hematology

## 2017-11-15 ENCOUNTER — Other Ambulatory Visit: Payer: Self-pay | Admitting: Hematology

## 2017-11-15 ENCOUNTER — Encounter: Payer: Self-pay | Admitting: Hematology

## 2017-11-15 ENCOUNTER — Telehealth: Payer: Self-pay | Admitting: *Deleted

## 2017-11-15 ENCOUNTER — Ambulatory Visit: Payer: BLUE CROSS/BLUE SHIELD

## 2017-11-15 ENCOUNTER — Other Ambulatory Visit: Payer: BLUE CROSS/BLUE SHIELD

## 2017-11-15 ENCOUNTER — Telehealth: Payer: Self-pay | Admitting: Pharmacist

## 2017-11-15 ENCOUNTER — Ambulatory Visit: Payer: BLUE CROSS/BLUE SHIELD | Admitting: Nurse Practitioner

## 2017-11-15 MED ORDER — TRAMADOL HCL 50 MG PO TABS: 50.0000 mg | ORAL_TABLET | Freq: Three times a day (TID) | ORAL | 0 refills | Status: DC | PRN

## 2017-11-15 NOTE — Progress Notes (Signed)
Received printed message in box regarding Lidocaine patch.  Called [facility]([place]) to have them fax rejection to [phone]. She states it was sent to [phone].  Received PA request via fax. Reviewed notes and it had already been denied by insurance and gave to Dr. Burr Medico.  Took Dr. Burr Medico rejection and printed message left. She states she will try to appeal but doesn't think it will be approved.

## 2017-11-15 NOTE — Telephone Encounter (Signed)
Oral Oncology Pharmacist Encounter  Received call from patient with questions about appeal of lidocaine patch. Patient also with questions about new treatment regimen for recurrence of rectal cancer.  Discussed with patient what to expect with his new treatment of FOLFIRI plus Avastin. Discussed in detail early versus late diarrhea that can occur with irinotecan. All questions answered.  Appeal for lidocaine patch is in process and patient has already been updated by Dr. Ernestina Penna collaborative practice RN.    Patient knows to call the office with any additional questions or concerns.  Oral Oncology Clinic will continue to follow.  Johny Drilling, PharmD, BCPS, BCOP 11/15/2017 3:37 PM Oral Oncology Clinic [phone]

## 2017-11-15 NOTE — Telephone Encounter (Signed)
Spoke with pt and informed pt that his insurance has denied Lidoderm patch.   Pt stated he has been taking Tramadol - given to pt post surgery.   Pt requested refill of Tramadol.  Pt voiced understanding of insurance denial. Dr. Burr Medico notified.

## 2017-11-16 ENCOUNTER — Telehealth: Payer: Self-pay | Admitting: Hematology

## 2017-11-16 NOTE — Telephone Encounter (Signed)
R/s appt per patient request to move patient from 3/27 to 3/26 - patient is aware and YF okay with r/s .

## 2017-11-19 ENCOUNTER — Other Ambulatory Visit: Payer: Self-pay | Admitting: Hematology

## 2017-11-20 ENCOUNTER — Inpatient Hospital Stay: Payer: BLUE CROSS/BLUE SHIELD

## 2017-11-20 ENCOUNTER — Telehealth: Payer: Self-pay | Admitting: Hematology

## 2017-11-20 ENCOUNTER — Inpatient Hospital Stay (HOSPITAL_BASED_OUTPATIENT_CLINIC_OR_DEPARTMENT_OTHER): Payer: BLUE CROSS/BLUE SHIELD | Admitting: Nurse Practitioner

## 2017-11-20 ENCOUNTER — Encounter: Payer: Self-pay | Admitting: Nurse Practitioner

## 2017-11-20 VITALS — BP 137/83 | HR 63 | Temp 97.9°F | Resp 18 | Ht 72.0 in | Wt 149.4 lb

## 2017-11-20 DIAGNOSIS — D500 Iron deficiency anemia secondary to blood loss (chronic): Secondary | ICD-10-CM

## 2017-11-20 DIAGNOSIS — G620 Drug-induced polyneuropathy: Secondary | ICD-10-CM | POA: Diagnosis not present

## 2017-11-20 DIAGNOSIS — Z148 Genetic carrier of other disease: Secondary | ICD-10-CM

## 2017-11-20 DIAGNOSIS — T451X5A Adverse effect of antineoplastic and immunosuppressive drugs, initial encounter: Secondary | ICD-10-CM | POA: Diagnosis not present

## 2017-11-20 DIAGNOSIS — R109 Unspecified abdominal pain: Secondary | ICD-10-CM | POA: Diagnosis not present

## 2017-11-20 DIAGNOSIS — R209 Unspecified disturbances of skin sensation: Secondary | ICD-10-CM

## 2017-11-20 DIAGNOSIS — C20 Malignant neoplasm of rectum: Secondary | ICD-10-CM | POA: Diagnosis not present

## 2017-11-20 DIAGNOSIS — Z95828 Presence of other vascular implants and grafts: Secondary | ICD-10-CM

## 2017-11-20 DIAGNOSIS — K5900 Constipation, unspecified: Secondary | ICD-10-CM

## 2017-11-20 DIAGNOSIS — M79604 Pain in right leg: Secondary | ICD-10-CM | POA: Diagnosis not present

## 2017-11-20 LAB — CBC WITH DIFFERENTIAL/PLATELET
BASOS PCT: 1 %
Basophils Absolute: 0 10*3/uL (ref 0.0–0.1)
Eosinophils Absolute: 0.1 10*3/uL (ref 0.0–0.5)
Eosinophils Relative: 1 %
HEMATOCRIT: 39.6 % (ref 38.4–49.9)
HEMOGLOBIN: 13.5 g/dL (ref 13.0–17.1)
LYMPHS PCT: 11 %
Lymphs Abs: 0.6 10*3/uL — ABNORMAL LOW (ref 0.9–3.3)
MCH: 32.9 pg (ref 27.2–33.4)
MCHC: 34.2 g/dL (ref 32.0–36.0)
MCV: 96.1 fL (ref 79.3–98.0)
MONO ABS: 0.6 10*3/uL (ref 0.1–0.9)
MONOS PCT: 11 %
NEUTROS ABS: 4 10*3/uL (ref 1.5–6.5)
NEUTROS PCT: 76 %
Platelets: 269 10*3/uL (ref 140–400)
RBC: 4.12 MIL/uL — ABNORMAL LOW (ref 4.20–5.82)
RDW: 13.4 % (ref 11.0–14.6)
WBC: 5.3 10*3/uL (ref 4.0–10.3)

## 2017-11-20 LAB — IRON AND TIBC
Iron: 63 ug/dL (ref 42–163)
Saturation Ratios: 23 % — ABNORMAL LOW (ref 42–163)
TIBC: 269 ug/dL (ref 202–409)
UIBC: 206 ug/dL

## 2017-11-20 LAB — COMPREHENSIVE METABOLIC PANEL
ALBUMIN: 3.7 g/dL (ref 3.5–5.0)
ALK PHOS: 113 U/L (ref 40–150)
ALT: 14 U/L (ref 0–55)
AST: 20 U/L (ref 5–34)
Anion gap: 10 (ref 3–11)
BILIRUBIN TOTAL: 0.8 mg/dL (ref 0.2–1.2)
BUN: 15 mg/dL (ref 7–26)
CO2: 24 mmol/L (ref 22–29)
Calcium: 9.4 mg/dL (ref 8.4–10.4)
Chloride: 100 mmol/L (ref 98–109)
Creatinine, Ser: 0.82 mg/dL (ref 0.70–1.30)
GFR calc Af Amer: >60 mL/min (ref >60)
GLUCOSE: 122 mg/dL (ref 70–140)
POTASSIUM: 3.7 mmol/L (ref 3.5–5.1)
Sodium: 134 mmol/L — ABNORMAL LOW (ref 136–145)
TOTAL PROTEIN: 7.6 g/dL (ref 6.4–8.3)

## 2017-11-20 LAB — CEA (IN HOUSE-CHCC): CEA (CHCC-In House): 7.96 ng/mL — ABNORMAL HIGH (ref 0.00–5.00)

## 2017-11-20 LAB — FERRITIN: FERRITIN: 151 ng/mL (ref 22–316)

## 2017-11-20 MED ORDER — ATROPINE SULFATE 1 MG/ML IJ SOLN
INTRAMUSCULAR | Status: AC
Start: 2017-11-20 — End: 2017-11-20
  Filled 2017-11-20: qty 1

## 2017-11-20 MED ORDER — SODIUM CHLORIDE 0.9 % IV SOLN: 10.0000 mg | Freq: Once | INTRAVENOUS | Status: DC

## 2017-11-20 MED ORDER — GABAPENTIN 100 MG PO CAPS: 100.0000 mg | ORAL_CAPSULE | Freq: Every day | ORAL | 0 refills | Status: DC

## 2017-11-20 MED ORDER — ONDANSETRON HCL 8 MG PO TABS: 8.0000 mg | ORAL_TABLET | Freq: Two times a day (BID) | ORAL | 1 refills | Status: DC | PRN

## 2017-11-20 MED ORDER — PROCHLORPERAZINE MALEATE 10 MG PO TABS: 10.0000 mg | ORAL_TABLET | Freq: Four times a day (QID) | ORAL | 1 refills | Status: DC | PRN

## 2017-11-20 MED ORDER — DEXAMETHASONE SODIUM PHOSPHATE 10 MG/ML IJ SOLN
INTRAMUSCULAR | Status: AC
  Filled 2017-11-20: qty 1

## 2017-11-20 MED ORDER — SODIUM CHLORIDE 0.9% FLUSH
10.0000 mL | INTRAVENOUS | Status: DC | PRN
  Administered 2017-11-20: 10 mL via INTRAVENOUS
  Filled 2017-11-20: qty 10

## 2017-11-20 MED ORDER — DEXAMETHASONE 4 MG PO TABS: 8.0000 mg | ORAL_TABLET | Freq: Every day | ORAL | 5 refills | Status: DC

## 2017-11-20 MED ORDER — LEUCOVORIN CALCIUM INJECTION 350 MG
400.0000 mg/m2 | Freq: Once | INTRAVENOUS | Status: AC
  Administered 2017-11-20: 756 mg via INTRAVENOUS
  Filled 2017-11-20: qty 37.8

## 2017-11-20 MED ORDER — ATROPINE SULFATE 1 MG/ML IJ SOLN: 0.5000 mg | Freq: Once | INTRAMUSCULAR | Status: DC | PRN

## 2017-11-20 MED ORDER — PALONOSETRON HCL INJECTION 0.25 MG/5ML
0.2500 mg | Freq: Once | INTRAVENOUS | Status: AC
  Administered 2017-11-20: 0.25 mg via INTRAVENOUS

## 2017-11-20 MED ORDER — FLUOROURACIL CHEMO INJECTION 2.5 GM/50ML
400.0000 mg/m2 | Freq: Once | INTRAVENOUS | Status: AC
  Administered 2017-11-20: 750 mg via INTRAVENOUS
  Filled 2017-11-20: qty 15

## 2017-11-20 MED ORDER — DEXAMETHASONE SODIUM PHOSPHATE 10 MG/ML IJ SOLN
10.0000 mg | Freq: Once | INTRAMUSCULAR | Status: AC
  Administered 2017-11-20: 10 mg via INTRAVENOUS

## 2017-11-20 MED ORDER — LOPERAMIDE HCL 2 MG PO TABS: 2.0000 mg | ORAL_TABLET | Freq: Four times a day (QID) | ORAL | 1 refills | Status: DC | PRN

## 2017-11-20 MED ORDER — IRINOTECAN HCL CHEMO INJECTION 100 MG/5ML
180.0000 mg/m2 | Freq: Once | INTRAVENOUS | Status: AC
  Administered 2017-11-20: 340 mg via INTRAVENOUS
  Filled 2017-11-20: qty 17

## 2017-11-20 MED ORDER — PALONOSETRON HCL INJECTION 0.25 MG/5ML
INTRAVENOUS | Status: AC
  Filled 2017-11-20: qty 5

## 2017-11-20 MED ORDER — SODIUM CHLORIDE 0.9 % IV SOLN
Freq: Once | INTRAVENOUS | Status: AC
  Administered 2017-11-20: 10:00:00 via INTRAVENOUS

## 2017-11-20 MED ORDER — FLUOROURACIL CHEMO INJECTION 5 GM/100ML
2400.0000 mg/m2 | INTRAVENOUS | Status: DC
  Administered 2017-11-20: 4550 mg via INTRAVENOUS
  Filled 2017-11-20: qty 91

## 2017-11-20 MED ORDER — HEPARIN SOD (PORK) LOCK FLUSH 100 UNIT/ML IV SOLN
500.0000 [IU] | Freq: Once | INTRAVENOUS | Status: DC
  Filled 2017-11-20: qty 5

## 2017-11-20 MED ORDER — LORAZEPAM 1 MG PO TABS: 1.0000 mg | ORAL_TABLET | Freq: Four times a day (QID) | ORAL | 0 refills | Status: DC | PRN

## 2017-11-20 MED ORDER — LIDOCAINE-PRILOCAINE 2.5-2.5 % EX CREA: 1.0000 "application " | TOPICAL_CREAM | CUTANEOUS | 2 refills | Status: DC | PRN

## 2017-11-20 NOTE — Patient Instructions (Signed)
[facility] Discharge Instructions for Patients Receiving Chemotherapy  Today you received the following chemotherapy agents Leucovorin, Irinotecan, 5FU  To help prevent nausea and vomiting after your treatment, we encourage you to take your nausea medication as prescribed.   If you develop nausea and vomiting that is not controlled by your nausea medication, call the clinic.   BELOW ARE SYMPTOMS THAT SHOULD BE REPORTED IMMEDIATELY:  *FEVER GREATER THAN 100.5 F  *CHILLS WITH OR WITHOUT FEVER  NAUSEA AND VOMITING THAT IS NOT CONTROLLED WITH YOUR NAUSEA MEDICATION  *UNUSUAL SHORTNESS OF BREATH  *UNUSUAL BRUISING OR BLEEDING  TENDERNESS IN MOUTH AND THROAT WITH OR WITHOUT PRESENCE OF ULCERS  *URINARY PROBLEMS  *BOWEL PROBLEMS  UNUSUAL RASH Items with * indicate a potential emergency and should be followed up as soon as possible.  Feel free to call the clinic should you have any questions or concerns. The clinic phone number is [phone].  Please show the [facility] [facility] at check-in to the Emergency Department and triage nurse.   Leucovorin injection What is this medicine? LEUCOVORIN (loo koe VOR in) is used to prevent or treat the harmful effects of some medicines. This medicine is used to treat anemia caused by a low amount of folic acid in the body. It is also used with 5-fluorouracil (5-FU) to treat colon cancer. This medicine may be used for other purposes; ask your health care provider or pharmacist if you have questions. What should I tell my health care provider before I take this medicine? They need to know if you have any of these conditions: -anemia from low levels of vitamin B-12 in the blood -an unusual or allergic reaction to leucovorin, folic acid, other medicines, foods, dyes, or preservatives -pregnant or trying to get pregnant -breast-feeding How should I use this medicine? This medicine is for injection into a muscle or into a vein. It  is given by a health care professional in a hospital or clinic setting. Talk to your pediatrician regarding the use of this medicine in children. Special care may be needed. Overdosage: If you think you have taken too much of this medicine contact a poison control center or emergency room at once. NOTE: This medicine is only for you. Do not share this medicine with others. What if I miss a dose? This does not apply. What may interact with this medicine? -capecitabine -fluorouracil -phenobarbital -phenytoin -primidone -trimethoprim-sulfamethoxazole This list may not describe all possible interactions. Give your health care provider a list of all the medicines, herbs, non-prescription drugs, or dietary supplements you use. Also tell them if you smoke, drink alcohol, or use illegal drugs. Some items may interact with your medicine. What should I watch for while using this medicine? Your condition will be monitored carefully while you are receiving this medicine. This medicine may increase the side effects of 5-fluorouracil, 5-FU. Tell your doctor or health care professional if you have diarrhea or mouth sores that do not get better or that get worse. What side effects may I notice from receiving this medicine? Side effects that you should report to your doctor or health care professional as soon as possible: -allergic reactions like skin rash, itching or hives, swelling of the face, lips, or tongue -breathing problems -fever, infection -mouth sores -unusual bleeding or bruising -unusually weak or tired Side effects that usually do not require medical attention (report to your doctor or health care professional if they continue or are bothersome): -constipation or diarrhea -loss of appetite -  nausea, vomiting This list may not describe all possible side effects. Call your doctor for medical advice about side effects. You may report side effects to FDA at 1-800-FDA-1088. Where should I keep  my medicine? This drug is given in a hospital or clinic and will not be stored at home. NOTE: This sheet is a summary. It may not cover all possible information. If you have questions about this medicine, talk to your doctor, pharmacist, or health care provider.  2018 Elsevier/Gold Standard (2008-02-18 16:50:29)   Irinotecan injection What is this medicine? IRINOTECAN (ir in oh TEE kan ) is a chemotherapy drug. It is used to treat colon and rectal cancer. This medicine may be used for other purposes; ask your health care provider or pharmacist if you have questions. COMMON BRAND NAME(S): Camptosar What should I tell my health care provider before I take this medicine? They need to know if you have any of these conditions: -blood disorders -dehydration -diarrhea -infection (especially a virus infection such as chickenpox, cold sores, or herpes) -liver disease -low blood counts, like low white cell, platelet, or red cell counts -recent or ongoing radiation therapy -an unusual or allergic reaction to irinotecan, sorbitol, other chemotherapy, other medicines, foods, dyes, or preservatives -pregnant or trying to get pregnant -breast-feeding How should I use this medicine? This drug is given as an infusion into a vein. It is administered in a hospital or clinic by a specially trained health care professional. Talk to your pediatrician regarding the use of this medicine in children. Special care may be needed. Overdosage: If you think you have taken too much of this medicine contact a poison control center or emergency room at once. NOTE: This medicine is only for you. Do not share this medicine with others. What if I miss a dose? It is important not to miss your dose. Call your doctor or health care professional if you are unable to keep an appointment. What may interact with this medicine? Do not take this medicine with any of the following medications: -atazanavir -certain medicines for  fungal infections like itraconazole and ketoconazole -St. Travor's Wort This medicine may also interact with the following medications: -dexamethasone -diuretics -laxatives -medicines for seizures like carbamazepine, mephobarbital, phenobarbital, phenytoin, primidone -medicines to increase blood counts like filgrastim, pegfilgrastim, sargramostim -prochlorperazine -vaccines This list may not describe all possible interactions. Give your health care provider a list of all the medicines, herbs, non-prescription drugs, or dietary supplements you use. Also tell them if you smoke, drink alcohol, or use illegal drugs. Some items may interact with your medicine. What should I watch for while using this medicine? Your condition will be monitored carefully while you are receiving this medicine. You will need important blood work done while you are taking this medicine. This drug may make you feel generally unwell. This is not uncommon, as chemotherapy can affect healthy cells as well as cancer cells. Report any side effects. Continue your course of treatment even though you feel ill unless your doctor tells you to stop. In some cases, you may be given additional medicines to help with side effects. Follow all directions for their use. You may get drowsy or dizzy. Do not drive, use machinery, or do anything that needs mental alertness until you know how this medicine affects you. Do not stand or sit up quickly, especially if you are an older patient. This reduces the risk of dizzy or fainting spells. Call your doctor or health care professional for advice if you  get a fever, chills or sore throat, or other symptoms of a cold or flu. Do not treat yourself. This drug decreases your body's ability to fight infections. Try to avoid being around people who are sick. This medicine may increase your risk to bruise or bleed. Call your doctor or health care professional if you notice any unusual bleeding. Be careful  brushing and flossing your teeth or using a toothpick because you may get an infection or bleed more easily. If you have any dental work done, tell your dentist you are receiving this medicine. Avoid taking products that contain aspirin, acetaminophen, ibuprofen, naproxen, or ketoprofen unless instructed by your doctor. These medicines may hide a fever. Do not become pregnant while taking this medicine. Women should inform their doctor if they wish to become pregnant or think they might be pregnant. There is a potential for serious side effects to an unborn child. Talk to your health care professional or pharmacist for more information. Do not breast-feed an infant while taking this medicine. What side effects may I notice from receiving this medicine? Side effects that you should report to your doctor or health care professional as soon as possible: -allergic reactions like skin rash, itching or hives, swelling of the face, lips, or tongue -low blood counts - this medicine may decrease the number of white blood cells, red blood cells and platelets. You may be at increased risk for infections and bleeding. -signs of infection - fever or chills, cough, sore throat, pain or difficulty passing urine -signs of decreased platelets or bleeding - bruising, pinpoint red spots on the skin, black, tarry stools, blood in the urine -signs of decreased red blood cells - unusually weak or tired, fainting spells, lightheadedness -breathing problems -chest pain -diarrhea -feeling faint or lightheaded, falls -flushing, runny nose, sweating during infusion -mouth sores or pain -pain, swelling, redness or irritation where injected -pain, swelling, warmth in the leg -pain, tingling, numbness in the hands or feet -problems with balance, talking, walking -stomach cramps, pain -trouble passing urine or change in the amount of urine -vomiting as to be unable to hold down drinks or food -yellowing of the eyes or  skin Side effects that usually do not require medical attention (report to your doctor or health care professional if they continue or are bothersome): -constipation -hair loss -headache -loss of appetite -nausea, vomiting -stomach upset This list may not describe all possible side effects. Call your doctor for medical advice about side effects. You may report side effects to FDA at 1-800-FDA-1088. Where should I keep my medicine? This drug is given in a hospital or clinic and will not be stored at home. NOTE: This sheet is a summary. It may not cover all possible information. If you have questions about this medicine, talk to your doctor, pharmacist, or health care provider.  2018 Elsevier/Gold Standard (2013-02-10 16:29:32)   Fluorouracil, 5FU; Diclofenac topical cream What is this medicine? FLUOROURACIL; DICLOFENAC (flure oh YOOR a sil; dye KLOE fen ak) is a combination of a topical chemotherapy agent and non-steroidal anti-inflammatory drug (NSAID). It is used on the skin to treat skin cancer and skin conditions that could become cancer. This medicine may be used for other purposes; ask your health care provider or pharmacist if you have questions. COMMON BRAND NAME(S): FLUORAC What should I tell my health care provider before I take this medicine? They need to know if you have any of these conditions: -bleeding problems -cigarette smoker -DPD enzyme deficiency -heart disease -  high blood pressure -if you frequently drink alcohol containing drinks -kidney disease -liver disease -open or infected skin -stomach problems -swelling or open sores at the treatment site -recent or planned coronary artery bypass graft (CABG) surgery -an unusual or allergic reaction to fluorouracil, diclofenac, aspirin, other NSAIDs, other medicines, foods, dyes, or preservatives -pregnant or trying to get pregnant -breast-feeding How should I use this medicine? This medicine is only for use on the  skin. Follow the directions on the prescription label. Wash hands before and after use. Wash affected area and gently pat dry. To apply this medicine use a cotton-tipped applicator, or use gloves if applying with fingertips. If applied with unprotected fingertips, it is very important to wash your hands well after you apply this medicine. Avoid applying to the eyes, nose, or mouth. Apply enough medicine to cover the affected area. You can cover the area with a light gauze dressing, but do not use tight or air-tight dressings. Finish the full course prescribed by your doctor or health care professional, even if you think your condition is better. Do not stop taking except on the advice of your doctor or health care professional. Talk to your pediatrician regarding the use of this medicine in children. Special care may be needed. Overdosage: If you think you have taken too much of this medicine contact a poison control center or emergency room at once. NOTE: This medicine is only for you. Do not share this medicine with others. What if I miss a dose? If you miss a dose, apply it as soon as you can. If it is almost time for your next dose, only use that dose. Do not apply extra doses. Contact your doctor or health care professional if you miss more than one dose. What may interact with this medicine? Interactions are not expected. Do not use any other skin products without telling your doctor or health care professional. This list may not describe all possible interactions. Give your health care provider a list of all the medicines, herbs, non-prescription drugs, or dietary supplements you use. Also tell them if you smoke, drink alcohol, or use illegal drugs. Some items may interact with your medicine. What should I watch for while using this medicine? Visit your doctor or health care professional for checks on your progress. You will need to use this medicine for 2 to 6 weeks. This may be longer depending on  the condition being treated. You may not see full healing for another 1 to 2 months after you stop using the medicine. Treated areas of skin can look unsightly during and for several weeks after treatment with this medicine. This medicine can make you more sensitive to the sun. Keep out of the sun. If you cannot avoid being in the sun, wear protective clothing and use sunscreen. Do not use sun lamps or tanning beds/booths. If a pet comes in contact with the area where this medicine was applied to your skin or if it is ingested, they may have a serious risk of side effects. If accidental contact happens, the skin of the pet should be washed right away with soap and water. Contact your vet right away if your pet becomes exposed. Do not become pregnant while taking this medicine. Women should inform their doctor if they wish to become pregnant or think they might be pregnant. There is a potential for serious side effects to an unborn child. Talk to your health care professional or pharmacist for more information. What side effects  may I notice from receiving this medicine? Side effects that you should report to your doctor or health care professional as soon as possible: -allergic reactions like skin rash, itching or hives, swelling of the face, lips, or tongue -black or bloody stools, blood in the urine or vomit -blurred vision -chest pain -difficulty breathing or wheezing -redness, blistering, peeling or loosening of the skin, including inside the mouth -severe redness and swelling of normal skin -slurred speech or weakness on one side of the body -trouble passing urine or change in the amount of urine -unexplained weight gain or swelling -unusually weak or tired -yellowing of eyes or skin Side effects that usually do not require medical attention (report to your doctor or health care professional if they continue or are bothersome): -increased sensitivity of the skin to sun and ultraviolet  light -pain and burning of the affected area -scaling or swelling of the affected area -skin rash, itching of the affected area -tenderness This list may not describe all possible side effects. Call your doctor for medical advice about side effects. You may report side effects to FDA at 1-800-FDA-1088. Where should I keep my medicine? Keep out of the reach of children and pets. Store at room temperature between 20 and 25 degrees C (68 and 77 degrees F). Throw away any unused medicine after the expiration date. NOTE: This sheet is a summary. It may not cover all possible information. If you have questions about this medicine, talk to your doctor, pharmacist, or health care provider.  2018 Elsevier/Gold Standard (2015-09-24 17:35:08)

## 2017-11-20 NOTE — Progress Notes (Signed)
[facility]  [phone] [phone]  Clinic Follow up Note   Patient Care Team: Robyn Haber, MD as PCP - General (Family Medicine) Michael Boston, MD as Consulting Physician (General Surgery) Pyrtle, Lajuan Lines, MD as Consulting Physician (Gastroenterology) Truitt Merle, MD as Consulting Physician (Hematology and Oncology) Kyung Rudd, MD as Consulting Physician (Radiation Oncology) 11/20/2017  SUMMARY OF ONCOLOGIC HISTORY: Oncology History   Cancer Staging Rectal adenocarcinoma [facility]) Staging form: Colon and Rectum, AJCC 8th Edition - Clinical stage from 10/09/2016: Stage IIIB (cT3, cN1b, cM0) - Signed by Truitt Merle, MD on 10/25/2016 - Pathologic stage from 02/21/2017: Stage IIA (ypT3, pN0, cM0) - Signed by Truitt Merle, MD on 10/18/2017       Rectal adenocarcinoma ([place])   10/03/2016 - 10/03/2016 Hospital Admission    Admit date: 10/03/16 The patient presented to the [facility] ED with rectal bleeding and difficulty with hygiene.      10/09/2016 Initial Diagnosis    Rectal adenocarcinoma       10/09/2016 Procedure    Colonoscopy showed a fungating, infiltrative and ulcerated partially obstructing large mass in the distal rectum, measuring 9 cm, prolapsing at anal canal. Scope not advanced proximal to the rectosigmoid colon.      10/09/2016 Initial Biopsy    Rectal mass biopsy showed invasive adenocarcinoma.      10/13/2016 Imaging    CT Chest Abdomen Pelvis w contrast IMPRESSION: Large irregular enhancing rectal mass consistent with known rectal cancer. There are small perirectal and small pelvic sidewall lymph nodes which are suspicious. Prominent perirectal vessels are also noted. No evidence of metastatic retroperitoneal or sigmoid mesocolon adenopathy or hepatic or pulmonary metastasis.      10/24/2016 Imaging    MRI Pelvis IMPRESSION: 1. Moderately motion degraded exam. 2. Large mass is centered at the anus with extension into the  low rectum. No complicating obstruction. 3. Mild mesorectal adenopathy, suspicious. 4. Right inguinal hernia containing nonobstructive small bowel.       11/08/2016 - 12/19/2016 Chemotherapy    Xeloda 50 mg twice daily, with concurrent radiation, held since 12/03/2016 due to colitis and hospitalization      11/08/2016 - 12/19/2016 Radiation Therapy    Neoadjuvant radiation to his rectal cancer  1) Pelvis/ 45 Gy in 25 fractions  2) Rectum boost/ 9 Gy in 5 fractions Under the care of Dr. Lisbeth Renshaw.      12/03/2016 - 12/09/2016 Hospital Admission    He presented with few days to one week history of diarrhea with 6-7 loose stools daily, along with a fever of 101.5 on the day of admission. Patient was also noted to be hypokalemic, hyponatremic. Hospitalized for further management. Stool studies were sent. C. difficile was negative. GI pathogen panel was positive for Yersinia.  XELODA was held at this time, until infection has cleared.       12/07/2016 Imaging    CT A/P W Contrast  IMPRESSION: 1. Abnormal dilatation of the proximal small bowel loops worrisome for either small bowel obstruction versus small bowel ileus. 2. 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 3. Large right inguinal hernia containing edematous loop of distal small bowel 4. No pneumatosis or bowel perforation.  No abscess. 5. Aortic atherosclerosis 6. Mild decrease in size of rectal mass. No change in right common iliac adenopathy.      02/21/2017 Surgery    XI ROBOT ABDOMINOPERINEAL RESECTION WITH COLOSTOMY by  Dr. Johney Maine      02/21/2017 Pathology Results    Diagnosis 02/21/17 1. Colon, segmental resection for tumor, rectosigmoid - INVASIVE ADENOCARCINOMA, MODERATELY DIFFERENTIATED, SPANNING 3.9 CM. - TUMOR INVADES THROUGH MUSCULARIS PROPRIA. - RESECTION MARGINS ARE NEGATIVE. - EIGHT OF EIGHT LYMPH NODES NEGATIVE FOR  CARCINOMA (0/8), SEE COMMENT. - SEE ONCOLOGY TABLE. 2. Soft tissue, biopsy, left lateral pelvic floor - BENIGN SKELETAL MUSCLE AND SOFT TISSUE. - NO MALIGNANCY IDENTIFIED. 3. Colon, segmental resection, proximal sigmoid - BENIGN COLONIC MUCOSA. - NO DYSPLASIA OR MALIGNANCY. 4. Liver, biopsy, liver mass - BENIGN VASCULAR PROLIFERATION CONSISTENT WITH HEMANGIOMA. - NO MALIGNANCY IDENTIFIED.      04/04/2017 - 08/12/2017 Chemotherapy    CAPOX every 3 weeks for 6 cycles. I decreased his first cycle of oxaliplatin platinum by 15%, he tolerated well, so we increased to full dose from cycle 2. He completed 5 cycles of oxaliplatin on 07/16/17 and completed Xeloda on 08/12/17.        08/15/2017 Procedure    Colonoscopy 08/15/17 IMPRESSION - Patent end colostomy with healthy appearing mucosa in the sigmoid colon. - The entire examined colon is normal. - No specimens collected.      09/18/2017 Imaging    CT CAP W Contrast 09/18/17 IMPRESSION: Interval abdomino-peritoneal resection with left lower quadrant colostomy. Increased diffuse cystitis, likely due to radiation. New presacral soft tissue density with central low attenuation, also suspicious for post treatment changes. Recommend continued attention on follow-up imaging. New mild retroperitoneal lymphadenopathy in the aortocaval space, measuring up to 1.8 cm. This is suspicious for metastatic disease, although it is conceivable that this could be reactive in etiology due to interval surgery and radiation therapy. Recommend short-term follow-up by CT in 3 months. New indeterminate 4 mm left upper lobe pulmonary nodule, likely inflammatory in etiology with metastatic disease considered less likely. Recommend continued attention on follow-up CT. Bilateral inguinal hernias, largest on the right containing several small bowel loops.      10/17/2017 PET scan    MPRESSION: Increased hypermetabolic lymphadenopathy in  abdominal retroperitoneum, right common iliac chain, and left supraclavicular region, consistent with metastatic disease. Stable tiny bilateral pulmonary nodules show no FDG uptake but are too small to characterize by PET. Recommend continued attention on follow-up CT. 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      10/29/2017 Relapse/Recurrence    Diagnosis Lymph node, needle/core biopsy, left supraclavicular - METASTATIC ADENOCARCINOMA, CONSISTENT WITH COLORECTAL PRIMARY. - SEE COMMENT.     CURRENT THERAPY:  Pending FOLFIRI every 2 weeks   INTERVAL HISTORY: Mr. Fant returns for follow-up as scheduled prior to beginning FOLFIRI.  He had Port-A-Cath placed without difficulty.  Questing prescription for EMLA cream.  He continues to report intermittent pain that initially started in the sacral area and has migrated to the right side and front of his leg associated numbness down to mid-thigh.  Pain score 5/10.  Insurance denied Lidoderm patch.  Uses tramadol 2 tablets twice daily and Tylenol to supplement.  He feels pain regimen is adequate but does not like the mental fog that comes with tramadol.  Also alternates heat and cold and does light stretching which helps some.  Pain makes it difficult to sleep.  He reports mild-moderate fatigue but remains fully functional.  Takes longer to recover from a long day of work and being on his feet.  Has moderate appetite but eating less lately.  Has not consumed protein shakes recently.  Has constipation and periodic abdominal  cramping using stool softener and occasional MiraLAX last complete BM was 3 days ago but has had smaller incomplete BM intermittently.  Denies fever or chills, no cough, chest pain, dyspnea, edema.  Minimal residual neuropathy to feet, able to function normally.  REVIEW OF SYSTEMS:   Constitutional: Denies fevers, chills (+) abnormal weight loss (+) mild-moderate fatigue  Eyes: Denies blurriness of  vision Ears, nose, mouth, throat, and face: Denies mucositis or sore throat Respiratory: Denies cough, dyspnea or wheezes Cardiovascular: Denies palpitation, chest discomfort (+) intermittent lower extremity swelling with prolonged standing/activity  Gastrointestinal:  Denies nausea, vomiting, diarrhea, heartburn or change in bowel habits (+) constipation, last complete BM 3 days ago, using stool softener and miralax PRN Skin: Denies abnormal skin rashes Lymphatics: Denies new lymphadenopathy or easy bruising Neurological:Denies new weaknesses (+) minimal intermittent neuropathy to feet Behavioral/Psych: Mood is stable, no new changes  MSK: (+) right anterolateral upper leg pain with associated numbness to mid-thigh All other systems were reviewed with the patient and are negative.  MEDICAL HISTORY:  Past Medical History:  Diagnosis Date  . Allergy   . Cancer ([place]) 10/09/2016   rectal adenocarcinoma  . Inguinal hernia     SURGICAL HISTORY: Past Surgical History:  Procedure Laterality Date  . COLONOSCOPY W/ BIOPSIES Bilateral 10/09/2016   rectal adenocarcinoma  . IR FLUORO GUIDE PORT INSERTION RIGHT  11/13/2017  . IR US GUIDE VASC ACCESS RIGHT  11/13/2017  . MOUTH SURGERY     age 68, to remove extra "eye" teeth  . [place] teeth extraction    . XI ROBOT ABDOMINAL PERINEAL RESECTION N/A 02/21/2017   Procedure: XI ROBOT ABDOMINOPERINEAL RESECTION WITH COLOSTOMY;  Surgeon: Michael Boston, MD;  Location: [facility];  Service: General;  Laterality: N/A;    I have reviewed the social history and family history with the patient and they are unchanged from previous note.  ALLERGIES:  is allergic to sulfa antibiotics.  MEDICATIONS:  Current Outpatient Medications  Medication Sig Dispense Refill  . ibuprofen (ADVIL,MOTRIN) 200 MG tablet Take 400 mg by mouth every 6 (six) hours as needed for headache or moderate pain.    . Multiple Vitamin (MULTIVITAMIN WITH MINERALS) TABS tablet Take 1 tablet  by mouth every other day.     . traMADol (ULTRAM) 50 MG tablet Take 1-2 tablets (50-100 mg total) by mouth every 8 (eight) hours as needed. 60 tablet 0  . Cyanocobalamin (VITAMIN B 12 PO) Take 1 tablet by mouth daily.    Marland Kitchen lidocaine (LIDODERM) 5 % Place 1 patch onto the skin daily. Remove & Discard patch within 12 hours or as directed by MD (Patient not taking: Reported on 11/01/2017) 30 patch 0   Current Facility-Administered Medications  Medication Dose Route Frequency Provider Last Rate Last Dose  . 0.9 %  sodium chloride infusion  500 mL Intravenous Continuous Pyrtle, Lajuan Lines, MD      . 0.9 %  sodium chloride infusion  500 mL Intravenous Once Pyrtle, Lajuan Lines, MD        PHYSICAL EXAMINATION: ECOG PERFORMANCE STATUS: 1 - Symptomatic but completely ambulatory  Vitals:   11/20/17 0838  BP: 137/83  Pulse: 63  Resp: 18  Temp: 97.9 F (36.6 C)  SpO2: 100%   Filed Weights   11/20/17 0838  Weight: 149 lb 6.4 oz (67.8 kg)    GENERAL:alert, no distress and comfortable SKIN: skin color, texture, turgor are normal, no rashes or significant lesions EYES: normal, Conjunctiva are pink and non-injected, sclera  clear OROPHARYNX:no exudate, no erythema and lips, buccal mucosa, and tongue normal  LYMPH:  no palpable cervical or axillary lymphadenopathy (+) 2 cm palpable left supraclavicular LN LUNGS: clear to auscultation with normal breathing effort HEART: regular rate & rhythm and no murmurs and no lower extremity edema ABDOMEN:abdomen soft, non-tender and normal bowel sounds. No hepatomegaly  (+) LLQ Colostomy  Musculoskeletal:no cyanosis of digits and no clubbing  NEURO: alert & oriented x 3 with fluent speech, no focal motor/sensory deficits PAC healing well, mild ecchymosis to surrounding right chest; no erythema or drainage   LABORATORY DATA:  I have reviewed the data as listed CBC Latest Ref Rng & Units 11/20/2017 11/13/2017 10/30/2017  WBC 4.0 - 10.3 K/uL 5.3 7.0 6.6  Hemoglobin 13.0 -  17.1 g/dL 13.5 14.2 14.2  Hematocrit 38.4 - 49.9 % 39.6 41.0 42.3  Platelets 140 - 400 K/uL 269 242 261     CMP Latest Ref Rng & Units 11/20/2017 10/30/2017 09/18/2017  Glucose 70 - 140 mg/dL 122 124 95  BUN 7 - 26 mg/dL 15 13 13   Creatinine 0.70 - 1.30 mg/dL 0.82 0.83 0.85  Sodium 136 - 145 mmol/L 134(L) 137 139  Potassium 3.5 - 5.1 mmol/L 3.7 3.9 4.2  Chloride 98 - 109 mmol/L 100 104 104  CO2 22 - 29 mmol/L 24 23 27   Calcium 8.4 - 10.4 mg/dL 9.4 9.6 9.4  Total Protein 6.4 - 8.3 g/dL 7.6 7.8 7.8  Total Bilirubin 0.2 - 1.2 mg/dL 0.8 0.5 0.6  Alkaline Phos 40 - 150 U/L 113 123 140  AST 5 - 34 U/L 20 24 34  ALT 0 - 55 U/L 14 17 29    Diagnosis Lymph node, needle/core biopsy, left supraclavicular - METASTATIC ADENOCARCINOMA, CONSISTENT WITH COLORECTAL PRIMARY. - SEE COMMENT.   RADIOGRAPHIC STUDIES: I have personally reviewed the radiological images as listed and agreed with the findings in the report. No results found.   ASSESSMENT & PLAN: 62 y.o.  gentleman, previously healthy, presented with a anal mass and a mild intermittent rectal bleeding for 5-6 months. Fatigue, anorexia, fatigue and weight loss for a month. He is currently on radiation for his rectal cancer, chemotherapy has been held lately.  1. Rectal Adenocarcinoma, low rectum, [id number], ypT3N0, stage IIIB, MSI-stable, node metastasis 09/2017  -Mr. Eichinger appears stable. PAC placed without difficulty on 3/19. He went to [facility] for 2nd opinion with Dr. Aleatha Borer on 3/18 and understands he is not a candidate for clinical trial at this time. Presents today to begin FOLFIRI chemotherapy. We reviewed side effects and symptom management in detail.  -He has mild weight loss, I encouraged him to consume small frequent meals high in calories and protein, he plans to begin drinking protein shakes again. He declines nutrition f/u at this time. -VSS. Labs reviewed; CBC and CMP unremarkable, adequate for treatment. Proceed with cycle 1 FOLFIRI  today, continue q2 weeks -FO results pending -F/u in 2 weeks with cycle 2  2. Right sacral and anterolateral upper leg pain with associated numbness to mid-thigh  -Likely secondary to right iliac adenopathy and psoas involvement. Has been taking tramadol 2 tabs BID and supplementing with tylenol as well as integrating non-pharm measures. Pain is well managed but interfering with sleep. His work requires him to be on his feet for prolonged periods. He does not like "fog" and constipation effects of tramadol, therefore not interested in escalating opioids at this time. I recommend to try gabapentin; given his low tolerance for side effects  will start at low dose and increase as needed; start with 1 tab qHS. Reviewed side effects.  -If his pain does not improve will consider other palliative measures   3. 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   4. Peripheral neuropathy, secondary to chemo, G1 -Has minimal neuropathy to feet. On B complex vitamin. No functional or sensory deficits.  5. Goals of care discussion -He understands the goal of therapy is palliative; full code   6. Constipation -Last complete BM 3 days ago, he uses stool softener and miralax PRN. I reviewed potential for diarrhea associated with irinotecan. He is holding laxative for now. He understands indications for using laxative vs anti-diarrheal if he develops change in his bowel habits. I encouraged him to increase water intake if he develops diarrhea and call clinic if not controlled by [place] reviewed, proceed with cycle 1 FOLFIRI today, continue q2 weeks -Prescriptions for EMLA, gabapentin  -Return for lab, f/u in 2 weeks with cycle 2 FOLFIRI  All questions were answered. The patient knows to call the clinic with any problems, questions or concerns. No  barriers to learning was detected. I spent 20 minutes counseling the patient face to face. The total time spent in the appointment was 25 minutes and more than 50% was on counseling.      Alla Feeling, NP 11/20/17

## 2017-11-20 NOTE — Telephone Encounter (Signed)
Appointments scheduled per 3/26 los. Patient not present new scheduled mailed with letter to patient

## 2017-11-21 ENCOUNTER — Ambulatory Visit: Payer: BLUE CROSS/BLUE SHIELD

## 2017-11-21 ENCOUNTER — Other Ambulatory Visit: Payer: BLUE CROSS/BLUE SHIELD

## 2017-11-21 ENCOUNTER — Ambulatory Visit: Payer: BLUE CROSS/BLUE SHIELD | Admitting: Hematology

## 2017-11-22 ENCOUNTER — Inpatient Hospital Stay: Payer: BLUE CROSS/BLUE SHIELD

## 2017-11-22 DIAGNOSIS — C20 Malignant neoplasm of rectum: Secondary | ICD-10-CM

## 2017-11-22 MED ORDER — HEPARIN SOD (PORK) LOCK FLUSH 100 UNIT/ML IV SOLN
500.0000 [IU] | Freq: Once | INTRAVENOUS | Status: AC | PRN
  Administered 2017-11-22: 500 [IU]
  Filled 2017-11-22: qty 5

## 2017-11-22 MED ORDER — SODIUM CHLORIDE 0.9% FLUSH
10.0000 mL | INTRAVENOUS | Status: DC | PRN
  Administered 2017-11-22: 10 mL
  Filled 2017-11-22: qty 10

## 2017-11-23 ENCOUNTER — Telehealth: Payer: Self-pay | Admitting: *Deleted

## 2017-11-23 ENCOUNTER — Telehealth: Payer: Self-pay | Admitting: Hematology

## 2017-11-23 NOTE — Telephone Encounter (Signed)
TCT patient to follow up with him after his first Folfirinox treatment this week. No answer but was able to leave message on identified phone with instructions to call back to [phone] if he had any questions or concerns.

## 2017-11-23 NOTE — Telephone Encounter (Signed)
-----   Message from Thomasene Lot, RN sent at 11/20/2017  1:26 PM EDT ----- Regarding: Dr Burr Medico 1st tx f/u call 1st tx f/u call

## 2017-11-23 NOTE — Telephone Encounter (Signed)
Per patient request - r/s appts from 4/10 to 4/11 and appts on 4/24 to following wk - per YF okay with r/s - left message for patient to confirm change . Also patient is my chart active.

## 2017-11-27 ENCOUNTER — Ambulatory Visit: Payer: BLUE CROSS/BLUE SHIELD

## 2017-11-27 ENCOUNTER — Other Ambulatory Visit: Payer: BLUE CROSS/BLUE SHIELD

## 2017-11-27 ENCOUNTER — Ambulatory Visit: Payer: BLUE CROSS/BLUE SHIELD | Admitting: Nurse Practitioner

## 2017-12-05 ENCOUNTER — Other Ambulatory Visit: Payer: BLUE CROSS/BLUE SHIELD

## 2017-12-05 ENCOUNTER — Ambulatory Visit: Payer: BLUE CROSS/BLUE SHIELD

## 2017-12-05 ENCOUNTER — Ambulatory Visit: Payer: BLUE CROSS/BLUE SHIELD | Admitting: Nurse Practitioner

## 2017-12-05 MED ORDER — PROCHLORPERAZINE MALEATE 10 MG PO TABS
ORAL_TABLET | ORAL | Status: AC
  Filled 2017-12-05: qty 1

## 2017-12-05 NOTE — Progress Notes (Signed)
[place]  [phone] [phone]) [phone]  Clinic Follow Up Note   Patient Care Team: Robyn Haber, MD as PCP - General (Family Medicine) Michael Boston, MD as Consulting Physician (General Surgery) Pyrtle, Lajuan Lines, MD as Consulting Physician (Gastroenterology) Truitt Merle, MD as Consulting Physician (Hematology and Oncology) Kyung Rudd, MD as Consulting Physician (Radiation Oncology)   Date of Service:  12/06/2017  CHIEF COMPLAINTS:  Follow up rectal adenocarcinoma  Oncology History   Cancer Staging Rectal adenocarcinoma [facility]) Staging form: Colon and Rectum, AJCC 8th Edition - Clinical stage from 10/09/2016: Stage IIIB (cT3, cN1b, cM0) - Signed by Truitt Merle, MD on 10/25/2016 - Pathologic stage from 02/21/2017: Stage IIA (ypT3, pN0, cM0) - Signed by Truitt Merle, MD on 10/18/2017       Rectal adenocarcinoma ([place])   10/03/2016 - 10/03/2016 Hospital Admission    Admit date: 10/03/16 The patient presented to the [facility] ED with rectal bleeding and difficulty with hygiene.      10/09/2016 Initial Diagnosis    Rectal adenocarcinoma       10/09/2016 Procedure    Colonoscopy showed a fungating, infiltrative and ulcerated partially obstructing large mass in the distal rectum, measuring 9 cm, prolapsing at anal canal. Scope not advanced proximal to the rectosigmoid colon.      10/09/2016 Initial Biopsy    Rectal mass biopsy showed invasive adenocarcinoma.      10/13/2016 Imaging    CT Chest Abdomen Pelvis w contrast IMPRESSION: Large irregular enhancing rectal mass consistent with known rectal cancer. There are small perirectal and small pelvic sidewall lymph nodes which are suspicious. Prominent perirectal vessels are also noted. No evidence of metastatic retroperitoneal or sigmoid mesocolon adenopathy or hepatic or pulmonary metastasis.      10/24/2016 Imaging    MRI Pelvis IMPRESSION: 1. Moderately motion degraded exam. 2. Large mass is centered at  the anus with extension into the low rectum. No complicating obstruction. 3. Mild mesorectal adenopathy, suspicious. 4. Right inguinal hernia containing nonobstructive small bowel.       11/08/2016 - 12/19/2016 Chemotherapy    Xeloda 50 mg twice daily, with concurrent radiation, held since 12/03/2016 due to colitis and hospitalization      11/08/2016 - 12/19/2016 Radiation Therapy    Neoadjuvant radiation to his rectal cancer  1) Pelvis/ 45 Gy in 25 fractions  2) Rectum boost/ 9 Gy in 5 fractions Under the care of Dr. Lisbeth Renshaw.      12/03/2016 - 12/09/2016 Hospital Admission    He presented with few days to one week history of diarrhea with 6-7 loose stools daily, along with a fever of 101.5 on the day of admission. Patient was also noted to be hypokalemic, hyponatremic. Hospitalized for further management. Stool studies were sent. C. difficile was negative. GI pathogen panel was positive for Yersinia.  XELODA was held at this time, until infection has cleared.       12/07/2016 Imaging    CT A/P W Contrast  IMPRESSION: 1. Abnormal dilatation of the proximal small bowel loops worrisome for either small bowel obstruction versus small bowel ileus. 2. 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 3. Large right inguinal hernia containing edematous loop of distal small bowel 4. No pneumatosis or bowel perforation.  No abscess. 5. Aortic atherosclerosis 6. Mild decrease in size of rectal mass. No change in right common iliac adenopathy.      02/21/2017 Surgery  XI ROBOT ABDOMINOPERINEAL RESECTION WITH COLOSTOMY by Dr. Johney Maine      02/21/2017 Pathology Results    Diagnosis 02/21/17 1. Colon, segmental resection for tumor, rectosigmoid - INVASIVE ADENOCARCINOMA, MODERATELY DIFFERENTIATED, SPANNING 3.9 CM. - TUMOR INVADES THROUGH MUSCULARIS PROPRIA. - RESECTION MARGINS ARE NEGATIVE. - EIGHT OF  EIGHT LYMPH NODES NEGATIVE FOR CARCINOMA (0/8), SEE COMMENT. - SEE ONCOLOGY TABLE. 2. Soft tissue, biopsy, left lateral pelvic floor - BENIGN SKELETAL MUSCLE AND SOFT TISSUE. - NO MALIGNANCY IDENTIFIED. 3. Colon, segmental resection, proximal sigmoid - BENIGN COLONIC MUCOSA. - NO DYSPLASIA OR MALIGNANCY. 4. Liver, biopsy, liver mass - BENIGN VASCULAR PROLIFERATION CONSISTENT WITH HEMANGIOMA. - NO MALIGNANCY IDENTIFIED.      04/04/2017 - 08/12/2017 Chemotherapy    CAPOX every 3 weeks for 6 cycles. I decreased his first cycle of oxaliplatin platinum by 15%, he tolerated well, so we increased to full dose from cycle 2. He completed 5 cycles of oxaliplatin on 07/16/17 and completed Xeloda on 08/12/17.        08/15/2017 Procedure    Colonoscopy 08/15/17 IMPRESSION - Patent end colostomy with healthy appearing mucosa in the sigmoid colon. - The entire examined colon is normal. - No specimens collected.      09/18/2017 Imaging    CT CAP W Contrast 09/18/17 IMPRESSION: Interval abdomino-peritoneal resection with left lower quadrant colostomy. Increased diffuse cystitis, likely due to radiation. New presacral soft tissue density with central low attenuation, also suspicious for post treatment changes. Recommend continued attention on follow-up imaging. New mild retroperitoneal lymphadenopathy in the aortocaval space, measuring up to 1.8 cm. This is suspicious for metastatic disease, although it is conceivable that this could be reactive in etiology due to interval surgery and radiation therapy. Recommend short-term follow-up by CT in 3 months. New indeterminate 4 mm left upper lobe pulmonary nodule, likely inflammatory in etiology with metastatic disease considered less likely. Recommend continued attention on follow-up CT. Bilateral inguinal hernias, largest on the right containing several small bowel loops.      10/17/2017 PET scan    MPRESSION: Increased hypermetabolic  lymphadenopathy in abdominal retroperitoneum, right common iliac chain, and left supraclavicular region, consistent with metastatic disease. Stable tiny bilateral pulmonary nodules show no FDG uptake but are too small to characterize by PET. Recommend continued attention on follow-up CT. 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      10/29/2017 Relapse/Recurrence    Diagnosis Lymph node, needle/core biopsy, left supraclavicular - METASTATIC ADENOCARCINOMA, CONSISTENT WITH COLORECTAL PRIMARY. - SEE COMMENT.      11/20/2017 -  Chemotherapy    FOLFIRI every 2 weeks      HISTORY OF PRESENTING ILLNESS (10/25/16):  [name] 62 y.o. male is here because of his recently diagnosed rectal adenocarcinoma. He was referred by his gastroenterologist title parietal. Patient presents to my clinic, accompanied by his wife and his daughter.       He has noticed a anal mass accompanied by intermittent bleeding and pain in the rectum for the past 5 months. [facility] with rectal bleeding and difficulty with hygiene. He was without health insurance previously and so did not pursue evaluation or treatment until this time.  The patient was seen by Dr. Hilarie Fredrickson for a colonoscopy and biopsy on 10/09/16, revealing a large partially obstructing low rectal mass, prolapsing to anal verge. Biopsy showed adenocarcinoma. CT C/A/P on 10/13/16 revealed a large, irregular, enhancing mass consistent with known rectal cancer.  There were several suspicious small perirectal and small pelvic sidewall lymph nodes. Prominent perirectal vessels were also noted. MRI of the pelvis on 10/24/16 showed a large mass centered at the anus with extension into the low rectum without obstruction. There was suspicious mild mesorectal adenopathy, and a right inguinal hernia containing nonobstructive small bowel.   The patient comes to the clinic for consultation  today accompanied by his partner and daughter. He denies abdominal cramps, nausea, bowel or bladder concerns. He reports taking OTC stool softener as needed. He reports his appetite is good, but he has lost 12-15 lbs in the past few weeks. His daughter notes she has hemochromatosis and questions if this could be playing a role in the patient's anemia.  CURRENT THERAPY:  FOLFIRI every 2 weeks starting 11/20/17   INTERIM HISTORY:  [name] presents for follow up and cycle 2 FOLFIRI. He presents to the clinic today with his wife. He notes his first cycle went well. He notes he had 12-14 hour days with regular breaks. He did not have worsened fatigue but he felt pain in his legs after a while. He is looking to do acupuncture to help with his pain. He will take tylenol during the day and will take tramadol at night. 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     On review of symptoms, pt denies nausea and diarrhea. He wakes up from sleep often due to more frequent urination from drinking more water. 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     MEDICAL HISTORY:  Past Medical History:  Diagnosis Date  . Allergy   . Cancer ([place]) 10/09/2016   rectal adenocarcinoma  . Inguinal hernia     SURGICAL HISTORY: Past Surgical History:  Procedure Laterality Date  . COLONOSCOPY W/ BIOPSIES Bilateral 10/09/2016   rectal adenocarcinoma  . IR FLUORO GUIDE PORT INSERTION RIGHT  11/13/2017  . IR US GUIDE VASC ACCESS RIGHT  11/13/2017  . MOUTH SURGERY     age 89, to remove extra "eye" teeth  . [place] teeth extraction    . XI ROBOT ABDOMINAL PERINEAL RESECTION N/A 02/21/2017   Procedure: XI ROBOT ABDOMINOPERINEAL RESECTION WITH COLOSTOMY;  Surgeon: Michael Boston, MD;  Location: [facility];  Service: General;  Laterality: N/A;    SOCIAL HISTORY: Social History   Socioeconomic History  . Marital status: Single    Spouse name: Not on file  . Number of  children: 1  . Years of education: Not on file  . Highest education level: Not on file  Occupational History  . Occupation: Architectural technologist  Social Needs  . Financial resource strain: Not on file  . Food insecurity:    Worry: Not on file    Inability: Not on file  . Transportation needs:    Medical: Not on file    Non-medical: Not on file  Tobacco Use  . Smoking status: Former Smoker    Years: 2.00    Types: Cigarettes    Last attempt to quit: 2002    Years since quitting: 17.2  . Smokeless tobacco: Never Used  Substance and Sexual Activity  . Alcohol use: Yes    Alcohol/week: 6.0 oz    Types: 4 Glasses of wine, 6 Cans of beer per week    Comment: beer or a glass or wine most days of the week  . Drug use: No  . Sexual activity: Yes  Partners: Female  Lifestyle  . Physical activity:    Days per week: Not on file    Minutes per session: Not on file  . Stress: Not on file  Relationships  . Social connections:    Talks on phone: Not on file    Gets together: Not on file    Attends religious service: Not on file    Active member of club or organization: Not on file    Attends meetings of clubs or organizations: Not on file    Relationship status: Not on file  . Intimate partner violence:    Fear of current or ex partner: Not on file    Emotionally abused: Not on file    Physically abused: Not on file    Forced sexual activity: Not on file  Other Topics Concern  . Not on file  Social History Narrative  . Not on file    FAMILY HISTORY: Family History  Problem Relation Age of Onset  . Other Mother        bile duct tumor  . Stroke Mother   . Cancer Mother 54       bile duct cancer   . Prostate cancer Father 69  . Emphysema Father   . Colon cancer Neg Hx   . Esophageal cancer Neg Hx   . Pancreatic cancer Neg Hx   . Rectal cancer Neg Hx   . Stomach cancer Neg Hx     ALLERGIES:  is allergic to sulfa antibiotics.  MEDICATIONS:  Current Outpatient  Medications  Medication Sig Dispense Refill  . Cyanocobalamin (VITAMIN B 12 PO) Take 1 tablet by mouth daily.    Marland Kitchen gabapentin (NEURONTIN) 100 MG capsule Take 1 capsule (100 mg total) by mouth at bedtime. 30 capsule 0  . lidocaine-prilocaine (EMLA) cream Apply 1 application topically as needed. 30 g 2  . Multiple Vitamin (MULTIVITAMIN WITH MINERALS) TABS tablet Take 1 tablet by mouth every other day.     . ondansetron (ZOFRAN) 8 MG tablet Take 1 tablet (8 mg total) by mouth 2 (two) times daily as needed for refractory nausea / vomiting. Start on day 3 after chemotherapy. 30 tablet 1  . prochlorperazine (COMPAZINE) 10 MG tablet Take 1 tablet (10 mg total) by mouth every 6 (six) hours as needed (NAUSEA). 30 tablet 1  . traMADol (ULTRAM) 50 MG tablet Take 1-2 tablets (50-100 mg total) by mouth every 8 (eight) hours as needed. 60 tablet 0  . dexamethasone (DECADRON) 4 MG tablet Take 2 tablets (8 mg total) by mouth daily. Start the day after chemo for 2 days. (Patient not taking: Reported on 12/06/2017) 8 tablet 5  . loperamide (IMODIUM A-D) 2 MG tablet Take 1 tablet (2 mg total) by mouth 4 (four) times daily as needed. Take 2 at diarrhea onset , then 1 every 2hr until 12hrs with no BM. May take 2 every 4hrs at night. If diarrhea recurs repeat. (Patient not taking: Reported on 12/06/2017) 100 tablet 1  . LORazepam (ATIVAN) 1 MG tablet Take 1 tablet (1 mg total) by mouth every 6 (six) hours as needed (NAUSEA). (Patient not taking: Reported on 12/06/2017) 30 tablet 0   Current Facility-Administered Medications  Medication Dose Route Frequency Provider Last Rate Last Dose  . 0.9 %  sodium chloride infusion  500 mL Intravenous Continuous Pyrtle, Lajuan Lines, MD      . 0.9 %  sodium chloride infusion  500 mL Intravenous Once Pyrtle, Lajuan Lines, MD  REVIEW OF SYSTEMS:  Constitutional: Denies fevers, chills or abnormal night sweats (+) slight weight loss  Eyes: Denies blurriness of vision, double vision or watery  eyes Ears, nose, mouth, throat, and face: Denies mucositis or sore throat  Respiratory: Denies cough, dyspnea or wheezes Cardiovascular: Denies palpitation, chest discomfort or  Gastrointestinal:  Denies nausea, heartburn or change in bowel habits (+) colostomy bag  Skin: Negative Lymphatics: Denies new lymphadenopathy or easy bruising MSK:  (+) right glute pain and rigth mid-thigh numbness, with limp Neurological: (+) improved neuropathy, mild   Behavioral/Psych: Mood is stable, no new changes  All other systems were reviewed with the patient and are negative.   PHYSICAL EXAMINATION: ECOG PERFORMANCE STATUS: 1 - Symptomatic but completely ambulatory  Vitals:   12/06/17 1102  BP: 138/76  Pulse: 63  Resp: 18  Temp: 97.6 F (36.4 C)  SpO2: 100%   Filed Weights   12/06/17 1102  Weight: 147 lb 1.6 oz (66.7 kg)      GENERAL: alert, no distress and comfortable.  SKIN: skin color, texture, turgor are normal, no rashes or significant lesions NECK: supple, thyroid normal size, non-tender, without nodularity LYMPH: (+) 2 cm left supraclavicular lymph node is now barely palpable, non-tender  LUNGS: clear to auscultation and percussion with normal breathing effort HEART: regular rate & rhythm and no murmurs and no lower extremity edema ABDOMEN: abdomen soft, non-tender and normal bowel sounds (+) laparoscopic incisions all healed Musculoskeletal: no cyanosis of digits and no clubbing  PSYCH: alert & oriented x 3 with fluent speech NEURO: no focal motor/sensory deficits.     LABORATORY DATA:  I have reviewed the data as listed CBC Latest Ref Rng & Units 12/06/2017 11/20/2017 11/13/2017  WBC 4.0 - 10.3 K/uL 3.1(L) 5.3 7.0  Hemoglobin 13.0 - 17.1 g/dL 12.1(L) 13.5 14.2  Hematocrit 38.4 - 49.9 % 35.8(L) 39.6 41.0  Platelets 140 - 400 K/uL 232 269 242   CMP Latest Ref Rng & Units 12/06/2017 11/20/2017 10/30/2017  Glucose 70 - 140 mg/dL 130 122 124  BUN 7 - 26 mg/dL _0 Creatinine  0.70 - 1.30 mg/dL 0.79 0.82 0.83  Sodium 136 - 145 mmol/L 136 134(L) 137  Potassium 3.5 - 5.1 mmol/L 3.6 3.7 3.9  Chloride 98 - 109 mmol/L 103 100 104  CO2 22 - 29 mmol/L _1 Calcium 8.4 - 10.4 mg/dL 9.2 9.4 9.6  Total Protein 6.4 - 8.3 g/dL 7.1 7.6 7.8  Total Bilirubin 0.2 - 1.2 mg/dL 0.6 0.8 0.5  Alkaline Phos 40 - 150 U/L 110 113 123  AST 5 - 34 U/L _2 ALT 0 - 55 U/L _3 CE[facility] ([facility])  10/27/2016: 7.81 11/27/2016: 5.56 01/10/2017: 5.61 03/14/17: 5.11 04/11/17: 5.87 05/23/17: 8.79 06/19/17:  9.15 07/16/17:  9.88 08/10/17: 10.40 09/18/17: 8.31 10/30/17: 8.11 11/20/17: 7.96   PATHOLOGY:  10/29/17 Diagnosis Lymph node, needle/core biopsy, left supraclavicular - METASTATIC ADENOCARCINOMA, CONSISTENT WITH COLORECTAL PRIMARY. - SEE COMMENT. Microscopic Comment The malignant cells are positive for CDX-2 and cytokeratin 20. They are negative for cytokeratin 7, p63 and cytokeratin 5/6. The findings are consistent with primary colorectal adenocarcinoma. Dr. Thressa Sheller has reviewed the case and concurs with this interpretation. Dr. Burr Medico was paged on 10/30/17. Additional studies can be performed upon clinician request. (JBK:gt, 10/31/17)    Diagnosis 02/21/17 1. Colon, segmental resection for tumor, rectosigmoid - INVASIVE ADENOCARCINOMA, MODERATELY DIFFERENTIATED, SPANNING 3.9 CM. - TUMOR INVADES  THROUGH MUSCULARIS PROPRIA. - RESECTION MARGINS ARE NEGATIVE. - EIGHT OF EIGHT LYMPH NODES NEGATIVE FOR CARCINOMA (0/8), SEE COMMENT. - SEE ONCOLOGY TABLE. 2. Soft tissue, biopsy, left lateral pelvic floor - BENIGN SKELETAL MUSCLE AND SOFT TISSUE. - NO MALIGNANCY IDENTIFIED. 3. Colon, segmental resection, proximal sigmoid - BENIGN COLONIC MUCOSA. - NO DYSPLASIA OR MALIGNANCY. 4. Liver, biopsy, liver mass - BENIGN VASCULAR PROLIFERATION CONSISTENT WITH HEMANGIOMA. - NO MALIGNANCY IDENTIFIED. Microscopic Comment 1. COLON AND RECTUM (INCLUDING TRANS-ANAL  RESECTION): Specimen: Rectosigmoid colon with anus. Procedure: Abdominoperineal resection. Tumor site: Distal 1/3 rectum to anus. Specimen integrity: Intact. Macroscopic intactness of mesorectum: Incomplete. Macroscopic tumor perforation: Not identified. Invasive tumor: Maximum size: 3.9 cm Histologic type(s): Invasive adenocarcinoma. Histologic grade and differentiation: G2: moderately differentiated/low grade Type of polyp in which invasive carcinoma arose: N/A. Microscopic extension of invasive tumor: Tumor invades through muscularis propria. Lymph-Vascular invasion: Not identified. 1 of 3 FINAL for ROWE, WARMAN [phone]) Microscopic Comment(continued) Peri-neural invasion: Present. Tumor deposit(s) (discontinuous extramural extension): Not identified. Resection margins: Proximal margin: Negative. Distal margin: Negative. Circumferential (radial) (posterior ascending, posterior descending; lateral and posterior mid-rectum; and entire lower 1/3 rectum): Negative. Mesenteric margin (sigmoid and transverse): Negative. Distance closest margin (if all above margins negative): <0.1 cm radial margin. >1 cm distal margin. Treatment effect (neo-adjuvant therapy): Present Additional polyp(s): N/A. Non-neoplastic findings: None. Lymph nodes: number examined 8; number positive: 0 (treatment effect present). Pathologic Staging: ypT3, ypN0, ypMX Ancillary studies: Can be performed upon request. Comment: The fat was cleared and additional lymph nodes were not identified.  Diagnosis 10/09/2016 Rectum, biopsy, mass - ADENOCARCINOMA.  RADIOGRAPHIC STUDIES: I have personally reviewed the radiological images as listed and agreed with the findings in the report.  PET Scan 10/17/17 IMPRESSION: Increased hypermetabolic lymphadenopathy in abdominal retroperitoneum, right common iliac chain, and left supraclavicular region, consistent with metastatic disease. Stable tiny bilateral pulmonary  nodules show no FDG uptake but are too small to characterize by PET. Recommend continued attention on follow-up CT. 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   CT CAP W Contrast 09/18/17 IMPRESSION: Interval abdomino-peritoneal resection with left lower quadrant colostomy. Increased diffuse cystitis, likely due to radiation. New presacral soft tissue density with central low attenuation, also suspicious for post treatment changes. Recommend continued attention on follow-up imaging. New mild retroperitoneal lymphadenopathy in the aortocaval space, measuring up to 1.8 cm. This is suspicious for metastatic disease, although it is conceivable that this could be reactive in etiology due to interval surgery and radiation therapy. Recommend short-term follow-up by CT in 3 months. New indeterminate 4 mm left upper lobe pulmonary nodule, likely inflammatory in etiology with metastatic disease considered less likely. Recommend continued attention on follow-up CT. Bilateral inguinal hernias, largest on the right containing several small bowel loops.   Colonoscopy 08/15/17 IMPRESSION - Patent end colostomy with healthy appearing mucosa in the sigmoid colon. - The entire examined colon is normal. - No specimens collected.  MRI Pelvis W WO Contrast 01/29/17 IMPRESSION: Significant decrease in size of soft tissue mass at the anorectal junction, with post radiation changes. No residual perirectal lymphadenopathy or other signs of pelvic metastatic disease. Stable small right inguinal hernia containing small bowel.  CT A/P W Contrast 12/07/16 IMPRESSION: 1. Abnormal dilatation of the proximal small bowel loops worrisome for either small bowel obstruction versus small bowel ileus. 2. 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 3. Large right inguinal hernia  containing edematous loop of distal small bowel 4. No pneumatosis or bowel perforation.  No abscess. 5. Aortic atherosclerosis 6. Mild decrease in size of rectal mass. No change in right common iliac adenopathy.  DG Chest 2 View 12/03/2016 IMPRESSION: 1. No active cardiopulmonary disease. No evidence of pneumonia or pulmonary edema. No evidence of intrathoracic metastasis. 2. Hyperexpanded lungs suggesting COPD.  MRI Pelvis 10/24/2016 IMPRESSION: 1. Moderately motion degraded exam. 2. Large mass is centered at the anus with extension into the low rectum. No complicating obstruction. 3. Mild mesorectal adenopathy, suspicious. 4. Right inguinal hernia containing nonobstructive small bowel.  CT CAP w Contrast 10/13/2016 IMPRESSION: Large irregular enhancing rectal mass consistent with known rectal cancer. There are small perirectal and small pelvic sidewall lymph nodes which are suspicious. Prominent perirectal vessels are also noted. No evidence of metastatic retroperitoneal or sigmoid mesocolon adenopathy or hepatic or pulmonary metastasis.  COLONOSCOPY 10/09/2016 Dr. Hilarie Fredrickson  A fungating, infiltrative and ulcerated partially obstructing large mass was found in the distal rectum. The mass was circumferential. The mass measured seven cm in length (prolapsing and from dentate line to approx 7 cm). In addition, its inner diameter measured nine mm. Oozing was present. This was biopsied with a cold forceps for histology. - Due to the distal rectal mass the bowel preparation was poor and unable to be cleared via the adult upper endoscope (adult upper endoscope used due to obstructing mass). Scope not advanced proximal to the rectosigmoid colon.  ASSESSMENT & PLAN: 62 y.o.  gentleman, previously healthy, presented with a anal mass and a mild intermittent rectal bleeding for 5-6 months. Fatigue, anorexia, fatigue and weight loss for a month. He is currently on radiation for his rectal cancer,  chemotherapy has been held lately.  1. Rectal Adenocarcinoma, low rectum, [id number], ypT3N0, stage IIIB, MSI-stable, nodes metastasis 09/2017  -I previously reviewed his CT and MRI scan, colonoscopy and biopsy findings in detail with pt and his family members -Based on his imaging findings, he had clinical stage III disease. We reviewed his image in our GI tumor Board, he also has a small liver lesion which is indeterminate, likely benign, and we will follow up with a repeated CT in about 3 months.  -patient received neoadjuvant chemotherapy and radiation with Xeloda, which was held for the last few weeks due to infectious colitis. -He underwent APR on 02/21/2017, surgical margins were negative, all lymph nodes were negative. We've reviewed his surgical pathology findings he did have significant treatment effect on his surgical sample.  - We discussed the role of adjuvant chemotherapy. Given his stage III B disease, I recommend adjuvant FOLFOX or CAPOX. The patient opted CAPOX, which is every 3 weeks, for total 6 cycles.  -He recovered well from surgery, started adjuvant CAPOX 04/04/17. I decreased his first cycle of oxaliplatin platinum by 15%, he tolerated well, so we increased to full dose from cycle 2. He completed 5 cycles of oxaliplatin on 07/16/17 and completed Xeloda on 08/12/17. His oxaliplatin treatment was postponed a few times due to his working schedule -He had repeated colonoscopy by Dr. Hilarie Fredrickson in 07/2017 which was negative  -We discussed his CT CAP from 09/18/17 which shows multiple slightly enlarged RP lymph nodes and a 4 mm lung nodule in the left lower lobe. The enlargement of lymph nodes is possible reactive, or metastatic recurrent colon cancer.  He is clinically doing well, asymmetric, lab reviewed, CEA has been slightly elevated since his surgery. I suggest getting a PET scan in 4  weeks.  -PET Scan from 10/17/17 reveals increased hypermetabolic lymphadenopathy in abdominal retroperitoneum,  right common iliac chain, and left supraclavicular region, consistent with metastatic disease and stable tiny bilateral pulmonary nodules show no FDG uptake but are too small to characterize by PET. I discussed results with pt and his family. I highly suspect that this is metastatic disease. He will get a biopsy to confirm this.  -He underwent left supraclavicular lymph node biopsy on 10/29/17.  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 Given his rapid Recurrence (2 months) after completion of adjuvant Capox, I recommend FOLFIRI as his next line therapy. I ordered foundation one test on his node biopsy, to see if he is a candidate for EGFR inhibitor, or other targeted therapy.  His tumor has MSI stable, not a candidate for first-line immunotherapy. -He went to [facility] for 2nd opinion with Dr. Aleatha Borer on 3/18 and understands he is not a candidate for clinical trial [id number] at [facility] and chose to proceed with current chemotherapy plan.  -He started FOLFIRI on 11/20/17 and has tolerated first cycle well.  -His FO results are still pending, I will follow up on this and will add Avastin or Panitumumab to his treatment plan, depends on his FO results  -Labs reviewed, CMP WNL, Hg 12.1, WBC 3.1, lymphocytes at 0.6. Overall adequate to proceed with cycle 2 today.  -F/u in 4 weeks with me -Plan to repeat scan after cycle 5 or 6   2. 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 -Hemoglobin was 8.3 initially with low MCV, likely anemia of iron deficiency from chronic blood loss. This was confirmed by his iron study. -Anemia improved with IV iron  -His daughter was diagnosed with hemochromatosis (homozygous), his genetic testing showed he is a  carrier -He received IV iron once, I'll be cautious about IV iron replacement due to his hemachromatosis carrier status. -he took some oral iron also -will monitor his CBC and iron level closely. -Hg at 12.1 today (12/06/17)  3. Peripheral neuropathy, secondary to chemo, G1 -mild numbness, no decreased sensation on exam, hand function is normal  -He has slightly more noticeable neuropathy, but hand functions are normal and adequate for treatment -continue monitoring closely  -He takes B complex and his neuropathy has improved -Mild neuropathy  4.Goal of care discussion  -We again discussed the incurable nature of his cancer, and the overall poor prognosis due to the metastatic disease, especially if he does not have good response to chemotherapy or progress on chemo -The patient understands the goal of care is palliative, to prolong his life. -He is full code for now   5. Right sacral and anterolateral upper leg pain with associated numbness to mid-thigh  -Likely secondary to right iliac adenopathy and psoas involvement. Has been taking tramadol 2 tabs BID and supplementing with tylenol as well as integrating non-pharm measures.  -Pain is well managed but interfering with sleep. His work requires him to be on his feet for prolonged periods. He does not like "fog" and constipation effects of tramadol, therefore not interested in escalating opioids at this time.  -It was recommended to try gabapentin; given his low tolerance for side effects. He is to start at low dose and increase as needed; start with 1  tab qHS. Reviewed side effects.  -If his pain does worsen to a 5 or higher will consider other palliative measures such as radiation or nerve block.  -Will continue Gabapentin, tylenol in the AM and Tramadol in the PM.  -He is fine to try acupuncture to help with pain.   6. Constipation  -He uses stool softener and miralax PRN. I reviewed potential for diarrhea associated with irinotecan.   -He is holding laxative for now. He understands indications for using laxative vs anti-diarrheal if he develops change in his bowel habits.  -I previously encouraged him to increase water intake if he develops diarrhea and call clinic if not controlled by imodium -Constipation resolved currently, likely due to chemotherapy.    Plan  -refill Tramadol today  -Labs adequate to proceed with FOLFIRI cycle 2 today  -continue chemo every 2 weeks  -F/u with APP Lacie in 2 weeks and me in 4 weeks    All questions were answered. The patient knows to call the clinic with any problems, questions or concerns.  I spent 20 minutes counseling the patient face to face. The total time spent in the appointment was 25 minutes and more than 50% was on counseling.   Truitt Merle, MD 12/06/2017   This document serves as a record of services personally performed by Truitt Merle, MD. It was created on her behalf by Joslyn Devon, a trained medical scribe. The creation of this record is based on the scribe's personal observations and the provider's statements to them.   I have reviewed the above documentation for accuracy and completeness, and I agree with the above.

## 2017-12-06 ENCOUNTER — Encounter: Payer: Self-pay | Admitting: Hematology

## 2017-12-06 ENCOUNTER — Inpatient Hospital Stay: Payer: BLUE CROSS/BLUE SHIELD | Attending: Hematology

## 2017-12-06 ENCOUNTER — Inpatient Hospital Stay (HOSPITAL_BASED_OUTPATIENT_CLINIC_OR_DEPARTMENT_OTHER): Payer: BLUE CROSS/BLUE SHIELD | Admitting: Hematology

## 2017-12-06 ENCOUNTER — Inpatient Hospital Stay: Payer: BLUE CROSS/BLUE SHIELD

## 2017-12-06 VITALS — BP 138/76 | HR 63 | Temp 97.6°F | Resp 18 | Ht 72.0 in | Wt 147.1 lb

## 2017-12-06 DIAGNOSIS — R590 Localized enlarged lymph nodes: Secondary | ICD-10-CM | POA: Diagnosis not present

## 2017-12-06 DIAGNOSIS — G620 Drug-induced polyneuropathy: Secondary | ICD-10-CM | POA: Diagnosis not present

## 2017-12-06 DIAGNOSIS — D701 Agranulocytosis secondary to cancer chemotherapy: Secondary | ICD-10-CM | POA: Insufficient documentation

## 2017-12-06 DIAGNOSIS — C20 Malignant neoplasm of rectum: Secondary | ICD-10-CM | POA: Diagnosis present

## 2017-12-06 DIAGNOSIS — L270 Generalized skin eruption due to drugs and medicaments taken internally: Secondary | ICD-10-CM | POA: Insufficient documentation

## 2017-12-06 DIAGNOSIS — K5900 Constipation, unspecified: Secondary | ICD-10-CM | POA: Diagnosis not present

## 2017-12-06 DIAGNOSIS — R209 Unspecified disturbances of skin sensation: Secondary | ICD-10-CM

## 2017-12-06 DIAGNOSIS — T451X5A Adverse effect of antineoplastic and immunosuppressive drugs, initial encounter: Secondary | ICD-10-CM | POA: Diagnosis not present

## 2017-12-06 DIAGNOSIS — M79604 Pain in right leg: Secondary | ICD-10-CM | POA: Diagnosis not present

## 2017-12-06 DIAGNOSIS — D500 Iron deficiency anemia secondary to blood loss (chronic): Secondary | ICD-10-CM | POA: Insufficient documentation

## 2017-12-06 DIAGNOSIS — Z5111 Encounter for antineoplastic chemotherapy: Secondary | ICD-10-CM | POA: Diagnosis present

## 2017-12-06 DIAGNOSIS — M549 Dorsalgia, unspecified: Secondary | ICD-10-CM | POA: Insufficient documentation

## 2017-12-06 DIAGNOSIS — C770 Secondary and unspecified malignant neoplasm of lymph nodes of head, face and neck: Secondary | ICD-10-CM | POA: Diagnosis not present

## 2017-12-06 DIAGNOSIS — Z148 Genetic carrier of other disease: Secondary | ICD-10-CM | POA: Diagnosis not present

## 2017-12-06 LAB — CBC WITH DIFFERENTIAL/PLATELET
BASOS ABS: 0 10*3/uL (ref 0.0–0.1)
BASOS PCT: 1 %
EOS ABS: 0.1 10*3/uL (ref 0.0–0.5)
EOS PCT: 2 %
HCT: 35.8 % — ABNORMAL LOW (ref 38.4–49.9)
HEMOGLOBIN: 12.1 g/dL — AB (ref 13.0–17.1)
LYMPHS ABS: 0.6 10*3/uL — AB (ref 0.9–3.3)
Lymphocytes Relative: 18 %
MCH: 32.5 pg (ref 27.2–33.4)
MCHC: 33.8 g/dL (ref 32.0–36.0)
MCV: 96.2 fL (ref 79.3–98.0)
Monocytes Absolute: 0.4 10*3/uL (ref 0.1–0.9)
Monocytes Relative: 14 %
NEUTROS PCT: 65 %
Neutro Abs: 2 10*3/uL (ref 1.5–6.5)
PLATELETS: 232 10*3/uL (ref 140–400)
RBC: 3.72 MIL/uL — AB (ref 4.20–5.82)
RDW: 13.8 % (ref 11.0–14.6)
WBC: 3.1 10*3/uL — AB (ref 4.0–10.3)

## 2017-12-06 LAB — COMPREHENSIVE METABOLIC PANEL
ALBUMIN: 3.5 g/dL (ref 3.5–5.0)
ALK PHOS: 110 U/L (ref 40–150)
ALT: 18 U/L (ref 0–55)
AST: 24 U/L (ref 5–34)
Anion gap: 9 (ref 3–11)
BUN: 13 mg/dL (ref 7–26)
CALCIUM: 9.2 mg/dL (ref 8.4–10.4)
CHLORIDE: 103 mmol/L (ref 98–109)
CO2: 24 mmol/L (ref 22–29)
CREATININE: 0.79 mg/dL (ref 0.70–1.30)
GFR calc Af Amer: >60 mL/min (ref >60)
GFR calc non Af Amer: >60 mL/min (ref >60)
GLUCOSE: 130 mg/dL (ref 70–140)
Potassium: 3.6 mmol/L (ref 3.5–5.1)
Sodium: 136 mmol/L (ref 136–145)
Total Bilirubin: 0.6 mg/dL (ref 0.2–1.2)
Total Protein: 7.1 g/dL (ref 6.4–8.3)

## 2017-12-06 MED ORDER — PALONOSETRON HCL INJECTION 0.25 MG/5ML
0.2500 mg | Freq: Once | INTRAVENOUS | Status: AC
  Administered 2017-12-06: 0.25 mg via INTRAVENOUS

## 2017-12-06 MED ORDER — DEXAMETHASONE SODIUM PHOSPHATE 10 MG/ML IJ SOLN
INTRAMUSCULAR | Status: AC
  Filled 2017-12-06: qty 1

## 2017-12-06 MED ORDER — FLUOROURACIL CHEMO INJECTION 2.5 GM/50ML
400.0000 mg/m2 | Freq: Once | INTRAVENOUS | Status: AC
  Administered 2017-12-06: 750 mg via INTRAVENOUS
  Filled 2017-12-06: qty 15

## 2017-12-06 MED ORDER — ATROPINE SULFATE 1 MG/ML IJ SOLN
0.5000 mg | Freq: Once | INTRAMUSCULAR | Status: AC | PRN
Start: 2017-12-06 — End: 2017-12-06
  Administered 2017-12-06: 0.5 mg via INTRAVENOUS

## 2017-12-06 MED ORDER — SODIUM CHLORIDE 0.9% FLUSH
10.0000 mL | Freq: Once | INTRAVENOUS | Status: AC
  Administered 2017-12-06: 10 mL via INTRAVENOUS
  Filled 2017-12-06: qty 10

## 2017-12-06 MED ORDER — LEUCOVORIN CALCIUM INJECTION 350 MG
400.0000 mg/m2 | Freq: Once | INTRAVENOUS | Status: AC
  Administered 2017-12-06: 756 mg via INTRAVENOUS
  Filled 2017-12-06: qty 37.8

## 2017-12-06 MED ORDER — FLUOROURACIL CHEMO INJECTION 5 GM/100ML
2400.0000 mg/m2 | INTRAVENOUS | Status: DC
  Administered 2017-12-06: 4550 mg via INTRAVENOUS
  Filled 2017-12-06: qty 91

## 2017-12-06 MED ORDER — SODIUM CHLORIDE 0.9 % IV SOLN
Freq: Once | INTRAVENOUS | Status: AC
  Administered 2017-12-06: 13:00:00 via INTRAVENOUS

## 2017-12-06 MED ORDER — PALONOSETRON HCL INJECTION 0.25 MG/5ML
INTRAVENOUS | Status: AC
  Filled 2017-12-06: qty 5

## 2017-12-06 MED ORDER — IRINOTECAN HCL CHEMO INJECTION 100 MG/5ML
180.0000 mg/m2 | Freq: Once | INTRAVENOUS | Status: AC
  Administered 2017-12-06: 340 mg via INTRAVENOUS
  Filled 2017-12-06: qty 17

## 2017-12-06 MED ORDER — DEXAMETHASONE SODIUM PHOSPHATE 10 MG/ML IJ SOLN
10.0000 mg | Freq: Once | INTRAMUSCULAR | Status: AC
  Administered 2017-12-06: 10 mg via INTRAVENOUS

## 2017-12-06 MED ORDER — ATROPINE SULFATE 1 MG/ML IJ SOLN
INTRAMUSCULAR | Status: AC
  Filled 2017-12-06: qty 1

## 2017-12-06 MED ORDER — TRAMADOL HCL 50 MG PO TABS: 50.0000 mg | ORAL_TABLET | Freq: Three times a day (TID) | ORAL | 0 refills | Status: DC | PRN

## 2017-12-06 NOTE — Patient Instructions (Signed)
[facility] Discharge Instructions for Patients Receiving Chemotherapy  Today you received the following chemotherapy agents Leucovorin, Irinotecan, 5FU  To help prevent nausea and vomiting after your treatment, we encourage you to take your nausea medication as prescribed.   If you develop nausea and vomiting that is not controlled by your nausea medication, call the clinic.   BELOW ARE SYMPTOMS THAT SHOULD BE REPORTED IMMEDIATELY:  *FEVER GREATER THAN 100.5 F  *CHILLS WITH OR WITHOUT FEVER  NAUSEA AND VOMITING THAT IS NOT CONTROLLED WITH YOUR NAUSEA MEDICATION  *UNUSUAL SHORTNESS OF BREATH  *UNUSUAL BRUISING OR BLEEDING  TENDERNESS IN MOUTH AND THROAT WITH OR WITHOUT PRESENCE OF ULCERS  *URINARY PROBLEMS  *BOWEL PROBLEMS  UNUSUAL RASH Items with * indicate a potential emergency and should be followed up as soon as possible.  Feel free to call the clinic should you have any questions or concerns. The clinic phone number is [phone].  Please show the [facility] at check-in to the Emergency Department and triage nurse.   Leucovorin injection What is this medicine? LEUCOVORIN (loo koe VOR in) is used to prevent or treat the harmful effects of some medicines. This medicine is used to treat anemia caused by a low amount of folic acid in the body. It is also used with 5-fluorouracil (5-FU) to treat colon cancer. This medicine may be used for other purposes; ask your health care provider or pharmacist if you have questions. What should I tell my health care provider before I take this medicine? They need to know if you have any of these conditions: -anemia from low levels of vitamin B-12 in the blood -an unusual or allergic reaction to leucovorin, folic acid, other medicines, foods, dyes, or preservatives -pregnant or trying to get pregnant -breast-feeding How should I use this medicine? This medicine is for injection into a muscle or into a vein. It  is given by a health care professional in a hospital or clinic setting. Talk to your pediatrician regarding the use of this medicine in children. Special care may be needed. Overdosage: If you think you have taken too much of this medicine contact a poison control center or emergency room at once. NOTE: This medicine is only for you. Do not share this medicine with others. What if I miss a dose? This does not apply. What may interact with this medicine? -capecitabine -fluorouracil -phenobarbital -phenytoin -primidone -trimethoprim-sulfamethoxazole This list may not describe all possible interactions. Give your health care provider a list of all the medicines, herbs, non-prescription drugs, or dietary supplements you use. Also tell them if you smoke, drink alcohol, or use illegal drugs. Some items may interact with your medicine. What should I watch for while using this medicine? Your condition will be monitored carefully while you are receiving this medicine. This medicine may increase the side effects of 5-fluorouracil, 5-FU. Tell your doctor or health care professional if you have diarrhea or mouth sores that do not get better or that get worse. What side effects may I notice from receiving this medicine? Side effects that you should report to your doctor or health care professional as soon as possible: -allergic reactions like skin rash, itching or hives, swelling of the face, lips, or tongue -breathing problems -fever, infection -mouth sores -unusual bleeding or bruising -unusually weak or tired Side effects that usually do not require medical attention (report to your doctor or health care professional if they continue or are bothersome): -constipation or diarrhea -loss of appetite -  nausea, vomiting This list may not describe all possible side effects. Call your doctor for medical advice about side effects. You may report side effects to FDA at 1-800-FDA-1088. Where should I keep  my medicine? This drug is given in a hospital or clinic and will not be stored at home. NOTE: This sheet is a summary. It may not cover all possible information. If you have questions about this medicine, talk to your doctor, pharmacist, or health care provider.  2018 Elsevier/Gold Standard (2008-02-18 16:50:29)   Irinotecan injection What is this medicine? IRINOTECAN (ir in oh TEE kan ) is a chemotherapy drug. It is used to treat colon and rectal cancer. This medicine may be used for other purposes; ask your health care provider or pharmacist if you have questions. COMMON BRAND NAME(S): Camptosar What should I tell my health care provider before I take this medicine? They need to know if you have any of these conditions: -blood disorders -dehydration -diarrhea -infection (especially a virus infection such as chickenpox, cold sores, or herpes) -liver disease -low blood counts, like low white cell, platelet, or red cell counts -recent or ongoing radiation therapy -an unusual or allergic reaction to irinotecan, sorbitol, other chemotherapy, other medicines, foods, dyes, or preservatives -pregnant or trying to get pregnant -breast-feeding How should I use this medicine? This drug is given as an infusion into a vein. It is administered in a hospital or clinic by a specially trained health care professional. Talk to your pediatrician regarding the use of this medicine in children. Special care may be needed. Overdosage: If you think you have taken too much of this medicine contact a poison control center or emergency room at once. NOTE: This medicine is only for you. Do not share this medicine with others. What if I miss a dose? It is important not to miss your dose. Call your doctor or health care professional if you are unable to keep an appointment. What may interact with this medicine? Do not take this medicine with any of the following medications: -atazanavir -certain medicines for  fungal infections like itraconazole and ketoconazole -St. Quincey's Wort This medicine may also interact with the following medications: -dexamethasone -diuretics -laxatives -medicines for seizures like carbamazepine, mephobarbital, phenobarbital, phenytoin, primidone -medicines to increase blood counts like filgrastim, pegfilgrastim, sargramostim -prochlorperazine -vaccines This list may not describe all possible interactions. Give your health care provider a list of all the medicines, herbs, non-prescription drugs, or dietary supplements you use. Also tell them if you smoke, drink alcohol, or use illegal drugs. Some items may interact with your medicine. What should I watch for while using this medicine? Your condition will be monitored carefully while you are receiving this medicine. You will need important blood work done while you are taking this medicine. This drug may make you feel generally unwell. This is not uncommon, as chemotherapy can affect healthy cells as well as cancer cells. Report any side effects. Continue your course of treatment even though you feel ill unless your doctor tells you to stop. In some cases, you may be given additional medicines to help with side effects. Follow all directions for their use. You may get drowsy or dizzy. Do not drive, use machinery, or do anything that needs mental alertness until you know how this medicine affects you. Do not stand or sit up quickly, especially if you are an older patient. This reduces the risk of dizzy or fainting spells. Call your doctor or health care professional for advice if you  get a fever, chills or sore throat, or other symptoms of a cold or flu. Do not treat yourself. This drug decreases your body's ability to fight infections. Try to avoid being around people who are sick. This medicine may increase your risk to bruise or bleed. Call your doctor or health care professional if you notice any unusual bleeding. Be careful  brushing and flossing your teeth or using a toothpick because you may get an infection or bleed more easily. If you have any dental work done, tell your dentist you are receiving this medicine. Avoid taking products that contain aspirin, acetaminophen, ibuprofen, naproxen, or ketoprofen unless instructed by your doctor. These medicines may hide a fever. Do not become pregnant while taking this medicine. Women should inform their doctor if they wish to become pregnant or think they might be pregnant. There is a potential for serious side effects to an unborn child. Talk to your health care professional or pharmacist for more information. Do not breast-feed an infant while taking this medicine. What side effects may I notice from receiving this medicine? Side effects that you should report to your doctor or health care professional as soon as possible: -allergic reactions like skin rash, itching or hives, swelling of the face, lips, or tongue -low blood counts - this medicine may decrease the number of white blood cells, red blood cells and platelets. You may be at increased risk for infections and bleeding. -signs of infection - fever or chills, cough, sore throat, pain or difficulty passing urine -signs of decreased platelets or bleeding - bruising, pinpoint red spots on the skin, black, tarry stools, blood in the urine -signs of decreased red blood cells - unusually weak or tired, fainting spells, lightheadedness -breathing problems -chest pain -diarrhea -feeling faint or lightheaded, falls -flushing, runny nose, sweating during infusion -mouth sores or pain -pain, swelling, redness or irritation where injected -pain, swelling, warmth in the leg -pain, tingling, numbness in the hands or feet -problems with balance, talking, walking -stomach cramps, pain -trouble passing urine or change in the amount of urine -vomiting as to be unable to hold down drinks or food -yellowing of the eyes or  skin Side effects that usually do not require medical attention (report to your doctor or health care professional if they continue or are bothersome): -constipation -hair loss -headache -loss of appetite -nausea, vomiting -stomach upset This list may not describe all possible side effects. Call your doctor for medical advice about side effects. You may report side effects to FDA at 1-800-FDA-1088. Where should I keep my medicine? This drug is given in a hospital or clinic and will not be stored at home. NOTE: This sheet is a summary. It may not cover all possible information. If you have questions about this medicine, talk to your doctor, pharmacist, or health care provider.  2018 Elsevier/Gold Standard (2013-02-10 16:29:32)   Fluorouracil, 5FU; Diclofenac topical cream What is this medicine? FLUOROURACIL; DICLOFENAC (flure oh YOOR a sil; dye KLOE fen ak) is a combination of a topical chemotherapy agent and non-steroidal anti-inflammatory drug (NSAID). It is used on the skin to treat skin cancer and skin conditions that could become cancer. This medicine may be used for other purposes; ask your health care provider or pharmacist if you have questions. COMMON BRAND NAME(S): FLUORAC What should I tell my health care provider before I take this medicine? They need to know if you have any of these conditions: -bleeding problems -cigarette smoker -DPD enzyme deficiency -heart disease -  high blood pressure -if you frequently drink alcohol containing drinks -kidney disease -liver disease -open or infected skin -stomach problems -swelling or open sores at the treatment site -recent or planned coronary artery bypass graft (CABG) surgery -an unusual or allergic reaction to fluorouracil, diclofenac, aspirin, other NSAIDs, other medicines, foods, dyes, or preservatives -pregnant or trying to get pregnant -breast-feeding How should I use this medicine? This medicine is only for use on the  skin. Follow the directions on the prescription label. Wash hands before and after use. Wash affected area and gently pat dry. To apply this medicine use a cotton-tipped applicator, or use gloves if applying with fingertips. If applied with unprotected fingertips, it is very important to wash your hands well after you apply this medicine. Avoid applying to the eyes, nose, or mouth. Apply enough medicine to cover the affected area. You can cover the area with a light gauze dressing, but do not use tight or air-tight dressings. Finish the full course prescribed by your doctor or health care professional, even if you think your condition is better. Do not stop taking except on the advice of your doctor or health care professional. Talk to your pediatrician regarding the use of this medicine in children. Special care may be needed. Overdosage: If you think you have taken too much of this medicine contact a poison control center or emergency room at once. NOTE: This medicine is only for you. Do not share this medicine with others. What if I miss a dose? If you miss a dose, apply it as soon as you can. If it is almost time for your next dose, only use that dose. Do not apply extra doses. Contact your doctor or health care professional if you miss more than one dose. What may interact with this medicine? Interactions are not expected. Do not use any other skin products without telling your doctor or health care professional. This list may not describe all possible interactions. Give your health care provider a list of all the medicines, herbs, non-prescription drugs, or dietary supplements you use. Also tell them if you smoke, drink alcohol, or use illegal drugs. Some items may interact with your medicine. What should I watch for while using this medicine? Visit your doctor or health care professional for checks on your progress. You will need to use this medicine for 2 to 6 weeks. This may be longer depending on  the condition being treated. You may not see full healing for another 1 to 2 months after you stop using the medicine. Treated areas of skin can look unsightly during and for several weeks after treatment with this medicine. This medicine can make you more sensitive to the sun. Keep out of the sun. If you cannot avoid being in the sun, wear protective clothing and use sunscreen. Do not use sun lamps or tanning beds/booths. If a pet comes in contact with the area where this medicine was applied to your skin or if it is ingested, they may have a serious risk of side effects. If accidental contact happens, the skin of the pet should be washed right away with soap and water. Contact your vet right away if your pet becomes exposed. Do not become pregnant while taking this medicine. Women should inform their doctor if they wish to become pregnant or think they might be pregnant. There is a potential for serious side effects to an unborn child. Talk to your health care professional or pharmacist for more information. What side effects  may I notice from receiving this medicine? Side effects that you should report to your doctor or health care professional as soon as possible: -allergic reactions like skin rash, itching or hives, swelling of the face, lips, or tongue -black or bloody stools, blood in the urine or vomit -blurred vision -chest pain -difficulty breathing or wheezing -redness, blistering, peeling or loosening of the skin, including inside the mouth -severe redness and swelling of normal skin -slurred speech or weakness on one side of the body -trouble passing urine or change in the amount of urine -unexplained weight gain or swelling -unusually weak or tired -yellowing of eyes or skin Side effects that usually do not require medical attention (report to your doctor or health care professional if they continue or are bothersome): -increased sensitivity of the skin to sun and ultraviolet  light -pain and burning of the affected area -scaling or swelling of the affected area -skin rash, itching of the affected area -tenderness This list may not describe all possible side effects. Call your doctor for medical advice about side effects. You may report side effects to FDA at 1-800-FDA-1088. Where should I keep my medicine? Keep out of the reach of children and pets. Store at room temperature between 20 and 25 degrees C (68 and 77 degrees F). Throw away any unused medicine after the expiration date. NOTE: This sheet is a summary. It may not cover all possible information. If you have questions about this medicine, talk to your doctor, pharmacist, or health care provider.  2018 Elsevier/Gold Standard (2015-09-24 17:35:08)

## 2017-12-06 NOTE — Patient Instructions (Signed)

## 2017-12-08 ENCOUNTER — Inpatient Hospital Stay: Payer: BLUE CROSS/BLUE SHIELD

## 2017-12-08 VITALS — BP 143/89 | HR 58 | Temp 97.7°F | Resp 18

## 2017-12-08 DIAGNOSIS — C20 Malignant neoplasm of rectum: Secondary | ICD-10-CM

## 2017-12-08 MED ORDER — HEPARIN SOD (PORK) LOCK FLUSH 100 UNIT/ML IV SOLN
500.0000 [IU] | Freq: Once | INTRAVENOUS | Status: AC | PRN
  Administered 2017-12-08: 500 [IU]
  Filled 2017-12-08: qty 5

## 2017-12-08 MED ORDER — SODIUM CHLORIDE 0.9% FLUSH
10.0000 mL | INTRAVENOUS | Status: DC | PRN
  Administered 2017-12-08: 10 mL
  Filled 2017-12-08: qty 10

## 2017-12-13 ENCOUNTER — Encounter: Payer: Self-pay | Admitting: Hematology

## 2017-12-13 NOTE — Telephone Encounter (Signed)
Notified pt not to get too concerned with lower HGB for now.  May add in iron rich foods & see what happens.  No need to add iron supplements at this time.  Will verify with Dr Burr Medico.

## 2017-12-17 ENCOUNTER — Encounter: Payer: Self-pay | Admitting: Hematology

## 2017-12-19 ENCOUNTER — Other Ambulatory Visit: Payer: Self-pay | Admitting: Hematology

## 2017-12-19 ENCOUNTER — Ambulatory Visit: Payer: BLUE CROSS/BLUE SHIELD

## 2017-12-19 ENCOUNTER — Other Ambulatory Visit: Payer: BLUE CROSS/BLUE SHIELD

## 2017-12-19 ENCOUNTER — Ambulatory Visit: Payer: BLUE CROSS/BLUE SHIELD | Admitting: Hematology

## 2017-12-20 ENCOUNTER — Telehealth: Payer: Self-pay

## 2017-12-20 NOTE — Telephone Encounter (Signed)
Received fax copies of [facility] results, left results on Dr. Ernestina Penna desk for review.

## 2017-12-24 ENCOUNTER — Other Ambulatory Visit: Payer: Self-pay | Admitting: Hematology

## 2017-12-24 DIAGNOSIS — C20 Malignant neoplasm of rectum: Secondary | ICD-10-CM

## 2017-12-25 ENCOUNTER — Inpatient Hospital Stay: Payer: BLUE CROSS/BLUE SHIELD

## 2017-12-25 ENCOUNTER — Telehealth: Payer: Self-pay | Admitting: Nurse Practitioner

## 2017-12-25 ENCOUNTER — Inpatient Hospital Stay (HOSPITAL_BASED_OUTPATIENT_CLINIC_OR_DEPARTMENT_OTHER): Payer: BLUE CROSS/BLUE SHIELD | Admitting: Nurse Practitioner

## 2017-12-25 ENCOUNTER — Encounter: Payer: Self-pay | Admitting: Nurse Practitioner

## 2017-12-25 VITALS — BP 141/81 | HR 63 | Temp 97.6°F | Resp 18 | Ht 72.0 in | Wt 141.6 lb

## 2017-12-25 DIAGNOSIS — M79604 Pain in right leg: Secondary | ICD-10-CM

## 2017-12-25 DIAGNOSIS — G620 Drug-induced polyneuropathy: Secondary | ICD-10-CM

## 2017-12-25 DIAGNOSIS — C20 Malignant neoplasm of rectum: Secondary | ICD-10-CM

## 2017-12-25 DIAGNOSIS — T451X5A Adverse effect of antineoplastic and immunosuppressive drugs, initial encounter: Secondary | ICD-10-CM

## 2017-12-25 DIAGNOSIS — D500 Iron deficiency anemia secondary to blood loss (chronic): Secondary | ICD-10-CM | POA: Diagnosis not present

## 2017-12-25 DIAGNOSIS — D701 Agranulocytosis secondary to cancer chemotherapy: Secondary | ICD-10-CM | POA: Diagnosis not present

## 2017-12-25 DIAGNOSIS — L270 Generalized skin eruption due to drugs and medicaments taken internally: Secondary | ICD-10-CM

## 2017-12-25 DIAGNOSIS — K5900 Constipation, unspecified: Secondary | ICD-10-CM | POA: Diagnosis not present

## 2017-12-25 DIAGNOSIS — R590 Localized enlarged lymph nodes: Secondary | ICD-10-CM

## 2017-12-25 DIAGNOSIS — M549 Dorsalgia, unspecified: Secondary | ICD-10-CM | POA: Diagnosis not present

## 2017-12-25 LAB — COMPREHENSIVE METABOLIC PANEL
ALT: 15 U/L (ref 0–55)
AST: 21 U/L (ref 5–34)
Albumin: 3.9 g/dL (ref 3.5–5.0)
Alkaline Phosphatase: 120 U/L (ref 40–150)
Anion gap: 9 (ref 3–11)
BILIRUBIN TOTAL: 0.8 mg/dL (ref 0.2–1.2)
BUN: 16 mg/dL (ref 7–26)
CHLORIDE: 101 mmol/L (ref 98–109)
CO2: 24 mmol/L (ref 22–29)
Calcium: 9.9 mg/dL (ref 8.4–10.4)
Creatinine, Ser: 0.8 mg/dL (ref 0.70–1.30)
GFR calc Af Amer: >60 mL/min (ref >60)
Glucose, Bld: 98 mg/dL (ref 70–140)
POTASSIUM: 3.8 mmol/L (ref 3.5–5.1)
Sodium: 134 mmol/L — ABNORMAL LOW (ref 136–145)
TOTAL PROTEIN: 7.6 g/dL (ref 6.4–8.3)

## 2017-12-25 LAB — CBC WITH DIFFERENTIAL/PLATELET
BASOS ABS: 0 10*3/uL (ref 0.0–0.1)
Basophils Relative: 1 %
EOS PCT: 1 %
Eosinophils Absolute: 0 10*3/uL (ref 0.0–0.5)
HEMATOCRIT: 35.6 % — AB (ref 38.4–49.9)
Hemoglobin: 12.2 g/dL — ABNORMAL LOW (ref 13.0–17.1)
LYMPHS PCT: 22 %
Lymphs Abs: 0.4 10*3/uL — ABNORMAL LOW (ref 0.9–3.3)
MCH: 32.9 pg (ref 27.2–33.4)
MCHC: 34.2 g/dL (ref 32.0–36.0)
MCV: 96.2 fL (ref 79.3–98.0)
MONO ABS: 0.7 10*3/uL (ref 0.1–0.9)
MONOS PCT: 34 %
NEUTROS ABS: 0.8 10*3/uL — AB (ref 1.5–6.5)
Neutrophils Relative %: 42 %
PLATELETS: 351 10*3/uL (ref 140–400)
RBC: 3.71 MIL/uL — ABNORMAL LOW (ref 4.20–5.82)
RDW: 15.4 % — AB (ref 11.0–14.6)
WBC: 2 10*3/uL — ABNORMAL LOW (ref 4.0–10.3)

## 2017-12-25 LAB — IRON AND TIBC
IRON: 72 ug/dL (ref 42–163)
SATURATION RATIOS: 22 % — AB (ref 42–163)
TIBC: 331 ug/dL (ref 202–409)
UIBC: 259 ug/dL

## 2017-12-25 LAB — FERRITIN: FERRITIN: 258 ng/mL (ref 22–316)

## 2017-12-25 LAB — CEA (IN HOUSE-CHCC): CEA (CHCC-In House): 13.95 ng/mL — ABNORMAL HIGH (ref 0.00–5.00)

## 2017-12-25 MED ORDER — TRAMADOL HCL 50 MG PO TABS: 50.0000 mg | ORAL_TABLET | Freq: Three times a day (TID) | ORAL | 0 refills | Status: DC | PRN

## 2017-12-25 MED ORDER — CLINDAMYCIN PHOSPHATE 1 % EX GEL: Freq: Two times a day (BID) | CUTANEOUS | 1 refills | Status: DC

## 2017-12-25 MED ORDER — HEPARIN SOD (PORK) LOCK FLUSH 100 UNIT/ML IV SOLN
500.0000 [IU] | Freq: Once | INTRAVENOUS | Status: AC | PRN
  Administered 2017-12-25: 500 [IU]
  Filled 2017-12-25: qty 5

## 2017-12-25 MED ORDER — SODIUM CHLORIDE 0.9% FLUSH
10.0000 mL | INTRAVENOUS | Status: DC | PRN
  Administered 2017-12-25: 10 mL
  Filled 2017-12-25: qty 10

## 2017-12-25 NOTE — Telephone Encounter (Signed)
appts already scheduled per 4/30 los -

## 2017-12-25 NOTE — Patient Instructions (Signed)
Implanted Port Home Guide An implanted port is a type of central line that is placed under the skin. Central lines are used to provide IV access when treatment or nutrition needs to be given through a person's veins. Implanted ports are used for long-term IV access. An implanted port may be placed because:  You need IV medicine that would be irritating to the small veins in your hands or arms.  You need long-term IV medicines, such as antibiotics.  You need IV nutrition for a long period.  You need frequent blood draws for lab tests.  You need dialysis.  Implanted ports are usually placed in the chest area, but they can also be placed in the upper arm, the abdomen, or the leg. An implanted port has two main parts:  Reservoir. The reservoir is round and will appear as a small, raised area under your skin. The reservoir is the part where a needle is inserted to give medicines or draw blood.  Catheter. The catheter is a thin, flexible tube that extends from the reservoir. The catheter is placed into a large vein. Medicine that is inserted into the reservoir goes into the catheter and then into the vein.  How will I care for my incision site? Do not get the incision site wet. Bathe or shower as directed by your health care provider. How is my port accessed? Special steps must be taken to access the port:  Before the port is accessed, a numbing cream can be placed on the skin. This helps numb the skin over the port site.  Your health care provider uses a sterile technique to access the port. ? Your health care provider must put on a mask and sterile gloves. ? The skin over your port is cleaned carefully with an antiseptic and allowed to dry. ? The port is gently pinched between sterile gloves, and a needle is inserted into the port.  Only "non-coring" port needles should be used to access the port. Once the port is accessed, a blood return should be checked. This helps ensure that the port  is in the vein and is not clogged.  If your port needs to remain accessed for a constant infusion, a clear (transparent) bandage will be placed over the needle site. The bandage and needle will need to be changed every week, or as directed by your health care provider.  Keep the bandage covering the needle clean and dry. Do not get it wet. Follow your health care provider's instructions on how to take a shower or bath while the port is accessed.  If your port does not need to stay accessed, no bandage is needed over the port.  What is flushing? Flushing helps keep the port from getting clogged. Follow your health care provider's instructions on how and when to flush the port. Ports are usually flushed with saline solution or a medicine called heparin. The need for flushing will depend on how the port is used.  If the port is used for intermittent medicines or blood draws, the port will need to be flushed: ? After medicines have been given. ? After blood has been drawn. ? As part of routine maintenance.  If a constant infusion is running, the port may not need to be flushed.  How long will my port stay implanted? The port can stay in for as long as your health care provider thinks it is needed. When it is time for the port to come out, surgery will be   done to remove it. The procedure is similar to the one performed when the port was put in. When should I seek immediate medical care? When you have an implanted port, you should seek immediate medical care if:  You notice a bad smell coming from the incision site.  You have swelling, redness, or drainage at the incision site.  You have more swelling or pain at the port site or the surrounding area.  You have a fever that is not controlled with medicine.  This information is not intended to replace advice given to you by your health care provider. Make sure you discuss any questions you have with your health care provider. Document  Released: 08/14/2005 Document Revised: 01/20/2016 Document Reviewed: 04/21/2013 Elsevier Interactive Patient Education  2017 [employer]  

## 2017-12-25 NOTE — Progress Notes (Addendum)
[place]  Telephone:([phone]) [phone] Fax:(336) [phone]  Clinic Follow up Note   Patient Care Team: Robyn Haber, MD as PCP - General (Family Medicine) Michael Boston, MD as Consulting Physician (General Surgery) Pyrtle, Lajuan Lines, MD as Consulting Physician (Gastroenterology) Truitt Merle, MD as Consulting Physician (Hematology and Oncology) Kyung Rudd, MD as Consulting Physician (Radiation Oncology) 12/25/2017  SUMMARY OF ONCOLOGIC HISTORY: Oncology History   Cancer Staging Rectal adenocarcinoma [facility]) Staging form: Colon and Rectum, AJCC 8th Edition - Clinical stage from 10/09/2016: Stage IIIB (cT3, cN1b, cM0) - Signed by Truitt Merle, MD on 10/25/2016 - Pathologic stage from 02/21/2017: Stage IIA (ypT3, pN0, cM0) - Signed by Truitt Merle, MD on 10/18/2017       Rectal adenocarcinoma ([place])   10/03/2016 - 10/03/2016 Hospital Admission    Admit date: 10/03/16 The patient presented to the [facility] ED with rectal bleeding and difficulty with hygiene.      10/09/2016 Initial Diagnosis    Rectal adenocarcinoma       10/09/2016 Procedure    Colonoscopy showed a fungating, infiltrative and ulcerated partially obstructing large mass in the distal rectum, measuring 9 cm, prolapsing at anal canal. Scope not advanced proximal to the rectosigmoid colon.      10/09/2016 Initial Biopsy    Rectal mass biopsy showed invasive adenocarcinoma.      10/13/2016 Imaging    CT Chest Abdomen Pelvis w contrast IMPRESSION: Large irregular enhancing rectal mass consistent with known rectal cancer. There are small perirectal and small pelvic sidewall lymph nodes which are suspicious. Prominent perirectal vessels are also noted. No evidence of metastatic retroperitoneal or sigmoid mesocolon adenopathy or hepatic or pulmonary metastasis.      10/24/2016 Imaging    MRI Pelvis IMPRESSION: 1. Moderately motion degraded exam. 2. Large mass is centered at the anus with extension into the  low rectum. No complicating obstruction. 3. Mild mesorectal adenopathy, suspicious. 4. Right inguinal hernia containing nonobstructive small bowel.       11/08/2016 - 12/19/2016 Chemotherapy    Xeloda 50 mg twice daily, with concurrent radiation, held since 12/03/2016 due to colitis and hospitalization      11/08/2016 - 12/19/2016 Radiation Therapy    Neoadjuvant radiation to his rectal cancer  1) Pelvis/ 45 Gy in 25 fractions  2) Rectum boost/ 9 Gy in 5 fractions Under the care of Dr. Lisbeth Renshaw.      12/03/2016 - 12/09/2016 Hospital Admission    He presented with few days to one week history of diarrhea with 6-7 loose stools daily, along with a fever of 101.5 on the day of admission. Patient was also noted to be hypokalemic, hyponatremic. Hospitalized for further management. Stool studies were sent. C. difficile was negative. GI pathogen panel was positive for Yersinia.  XELODA was held at this time, until infection has cleared.       12/07/2016 Imaging    CT A/P W Contrast  IMPRESSION: 1. Abnormal dilatation of the proximal small bowel loops worrisome for either small bowel obstruction versus small bowel ileus. 2. 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 3. Large right inguinal hernia containing edematous loop of distal small bowel 4. No pneumatosis or bowel perforation.  No abscess. 5. Aortic atherosclerosis 6. Mild decrease in size of rectal mass. No change in right common iliac adenopathy.      02/21/2017 Surgery    XI ROBOT ABDOMINOPERINEAL RESECTION WITH COLOSTOMY by  Dr. Johney Maine      02/21/2017 Pathology Results    Diagnosis 02/21/17 1. Colon, segmental resection for tumor, rectosigmoid - INVASIVE ADENOCARCINOMA, MODERATELY DIFFERENTIATED, SPANNING 3.9 CM. - TUMOR INVADES THROUGH MUSCULARIS PROPRIA. - RESECTION MARGINS ARE NEGATIVE. - EIGHT OF EIGHT LYMPH NODES NEGATIVE FOR  CARCINOMA (0/8), SEE COMMENT. - SEE ONCOLOGY TABLE. 2. Soft tissue, biopsy, left lateral pelvic floor - BENIGN SKELETAL MUSCLE AND SOFT TISSUE. - NO MALIGNANCY IDENTIFIED. 3. Colon, segmental resection, proximal sigmoid - BENIGN COLONIC MUCOSA. - NO DYSPLASIA OR MALIGNANCY. 4. Liver, biopsy, liver mass - BENIGN VASCULAR PROLIFERATION CONSISTENT WITH HEMANGIOMA. - NO MALIGNANCY IDENTIFIED.      04/04/2017 - 08/12/2017 Chemotherapy    CAPOX every 3 weeks for 6 cycles. I decreased his first cycle of oxaliplatin platinum by 15%, he tolerated well, so we increased to full dose from cycle 2. He completed 5 cycles of oxaliplatin on 07/16/17 and completed Xeloda on 08/12/17.        08/15/2017 Procedure    Colonoscopy 08/15/17 IMPRESSION - Patent end colostomy with healthy appearing mucosa in the sigmoid colon. - The entire examined colon is normal. - No specimens collected.      09/18/2017 Imaging    CT CAP W Contrast 09/18/17 IMPRESSION: Interval abdomino-peritoneal resection with left lower quadrant colostomy. Increased diffuse cystitis, likely due to radiation. New presacral soft tissue density with central low attenuation, also suspicious for post treatment changes. Recommend continued attention on follow-up imaging. New mild retroperitoneal lymphadenopathy in the aortocaval space, measuring up to 1.8 cm. This is suspicious for metastatic disease, although it is conceivable that this could be reactive in etiology due to interval surgery and radiation therapy. Recommend short-term follow-up by CT in 3 months. New indeterminate 4 mm left upper lobe pulmonary nodule, likely inflammatory in etiology with metastatic disease considered less likely. Recommend continued attention on follow-up CT. Bilateral inguinal hernias, largest on the right containing several small bowel loops.      10/17/2017 PET scan    MPRESSION: Increased hypermetabolic lymphadenopathy in  abdominal retroperitoneum, right common iliac chain, and left supraclavicular region, consistent with metastatic disease. Stable tiny bilateral pulmonary nodules show no FDG uptake but are too small to characterize by PET. Recommend continued attention on follow-up CT. 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      10/29/2017 Relapse/Recurrence    Diagnosis Lymph node, needle/core biopsy, left supraclavicular - METASTATIC ADENOCARCINOMA, CONSISTENT WITH COLORECTAL PRIMARY. - SEE COMMENT.      11/20/2017 -  Chemotherapy    FOLFIRI every 2 weeks      12/30/2017 -  Chemotherapy    The patient had panitumumab (VECTIBIX) 400 mg in sodium chloride 0.9 % 100 mL chemo infusion, 6 mg/kg, Intravenous,  Once, 0 of 4 cycles  for chemotherapy treatment.      CURRENT THERAPY:  FOLFIRI every 2 weeks starting 11/20/17  INTERVAL HISTORY: David Clarke returns for follow up as scheduled prior to cycle 3 FOLFIRI. Right sacral and anterolateral leg pain with numbness is increasing. Usual pain score is 3-4 out of 10 but frequently elevates to 6/10. He takes 1 gm tylenol BID and 2 tabs tramadol at night with gabapentin, which has not been effective. Pain is constant, affecting sleep. Pain is increased if working on his feet for prolonged periods but he notes pain is still there even when not working. He plans to try acupuncture soon. Appetite is low, he is eating less. Plans to increase protein in his  diet. Has mild constipation, takes miralax PRN every 3-4 days, has normal BM q2-3 days. He is otherwise doing well.   REVIEW OF SYSTEMS:   Constitutional: Denies fevers, chills (+) abnormal weight loss (+) eating less  Eyes: Denies blurriness of vision Ears, nose, mouth, throat, and face: Denies mucositis or sore throat Respiratory: Denies cough, dyspnea or wheezes Cardiovascular: Denies palpitation, chest discomfort or lower extremity swelling Gastrointestinal:  Denies nausea,  vomiting, diarrhea, heartburn or change in bowel habits (+) BM q2-3 days with miralax PRN Skin: Denies abnormal skin rashes Lymphatics: Denies new lymphadenopathy or easy bruising Neurological:Denies numbness, tingling or new weaknesses (+) right upper leg numbness (+) neuropathy to hands and feet, stable   Behavioral/Psych: Mood is stable, no new changes  All other systems were reviewed with the patient and are negative.  MEDICAL HISTORY:  Past Medical History:  Diagnosis Date  . Allergy   . Cancer ([place]) 10/09/2016   rectal adenocarcinoma  . Inguinal hernia     SURGICAL HISTORY: Past Surgical History:  Procedure Laterality Date  . COLONOSCOPY W/ BIOPSIES Bilateral 10/09/2016   rectal adenocarcinoma  . IR FLUORO GUIDE PORT INSERTION RIGHT  11/13/2017  . IR US GUIDE VASC ACCESS RIGHT  11/13/2017  . MOUTH SURGERY     age 8, to remove extra "eye" teeth  . [place] teeth extraction    . XI ROBOT ABDOMINAL PERINEAL RESECTION N/A 02/21/2017   Procedure: XI ROBOT ABDOMINOPERINEAL RESECTION WITH COLOSTOMY;  Surgeon: Michael Boston, MD;  Location: [facility];  Service: General;  Laterality: N/A;    I have reviewed the social history and family history with the patient and they are unchanged from previous note.  ALLERGIES:  is allergic to sulfa antibiotics.  MEDICATIONS:  Current Outpatient Medications  Medication Sig Dispense Refill  . acetaminophen (TYLENOL) 500 MG tablet Take 500 mg by mouth every 6 (six) hours as needed.    . Cyanocobalamin (VITAMIN B 12 PO) Take 1 tablet by mouth daily.    Marland Kitchen gabapentin (NEURONTIN) 100 MG capsule Take 1 capsule (100 mg total) by mouth at bedtime. 30 capsule 0  . lidocaine-prilocaine (EMLA) cream Apply 1 application topically as needed. 30 g 2  . loperamide (IMODIUM A-D) 2 MG tablet Take 1 tablet (2 mg total) by mouth 4 (four) times daily as needed. Take 2 at diarrhea onset , then 1 every 2hr until 12hrs with no BM. May take 2 every 4hrs at night. If  diarrhea recurs repeat. 100 tablet 1  . Multiple Vitamin (MULTIVITAMIN WITH MINERALS) TABS tablet Take 1 tablet by mouth every other day.     . traMADol (ULTRAM) 50 MG tablet Take 1-2 tablets (50-100 mg total) by mouth every 8 (eight) hours as needed. 60 tablet 0  . clindamycin (CLINDAGEL) 1 % gel Apply topically 2 (two) times daily. 60 g 1  . dexamethasone (DECADRON) 4 MG tablet Take 2 tablets (8 mg total) by mouth daily. Start the day after chemo for 2 days. (Patient not taking: Reported on 12/06/2017) 8 tablet 5  . LORazepam (ATIVAN) 1 MG tablet Take 1 tablet (1 mg total) by mouth every 6 (six) hours as needed (NAUSEA). (Patient not taking: Reported on 12/06/2017) 30 tablet 0  . ondansetron (ZOFRAN) 8 MG tablet Take 1 tablet (8 mg total) by mouth 2 (two) times daily as needed for refractory nausea / vomiting. Start on day 3 after chemotherapy. (Patient not taking: Reported on 12/25/2017) 30 tablet 1  . prochlorperazine (COMPAZINE) 10 MG  tablet Take 1 tablet (10 mg total) by mouth every 6 (six) hours as needed (NAUSEA). (Patient not taking: Reported on 12/25/2017) 30 tablet 1   Current Facility-Administered Medications  Medication Dose Route Frequency Provider Last Rate Last Dose  . 0.9 %  sodium chloride infusion  500 mL Intravenous Continuous Pyrtle, Lajuan Lines, MD      . 0.9 %  sodium chloride infusion  500 mL Intravenous Once Pyrtle, Lajuan Lines, MD      . sodium chloride flush (NS) 0.9 % injection 10 mL  10 mL Intracatheter PRN Truitt Merle, MD   10 mL at 12/25/17 1214    PHYSICAL EXAMINATION: ECOG PERFORMANCE STATUS: 1 - Symptomatic but completely ambulatory  Vitals:   12/25/17 1108  BP: (!) 141/81  Pulse: 63  Resp: 18  Temp: 97.6 F (36.4 C)  SpO2: 100%   Filed Weights   12/25/17 1108  Weight: 141 lb 9.6 oz (64.2 kg)    GENERAL:alert, no distress and comfortable SKIN: skin color, texture, turgor are normal, no rashes or significant lesions EYES: normal, Conjunctiva are pink and  non-injected, sclera clear OROPHARYNX:no exudate, no erythema and lips, buccal mucosa, and tongue normal  LYMPH:  No axillary adenopathy (+) 2 cm palpable left [place] node  LUNGS: clear to auscultation with normal breathing effort HEART: regular rate & rhythm and no murmurs and no lower extremity edema ABDOMEN:abdomen soft, non-tender and normal bowel sounds (+) colostomy  Musculoskeletal:no cyanosis of digits and no clubbing  NEURO: alert & oriented x 3 with fluent speech, no focal motor/sensory deficits PAC without erythema   LABORATORY DATA:  I have reviewed the data as listed CBC Latest Ref Rng & Units 12/25/2017 12/06/2017 11/20/2017  WBC 4.0 - 10.3 K/uL 2.0(L) 3.1(L) 5.3  Hemoglobin 13.0 - 17.1 g/dL 12.2(L) 12.1(L) 13.5  Hematocrit 38.4 - 49.9 % 35.6(L) 35.8(L) 39.6  Platelets 140 - 400 K/uL 351 232 269     CMP Latest Ref Rng & Units 12/25/2017 12/06/2017 11/20/2017  Glucose 70 - 140 mg/dL 98 130 122  BUN 7 - 26 mg/dL _0 Creatinine 0.70 - 1.30 mg/dL 0.80 0.79 0.82  Sodium 136 - 145 mmol/L 134(L) 136 134(L)  Potassium 3.5 - 5.1 mmol/L 3.8 3.6 3.7  Chloride 98 - 109 mmol/L 101 103 100  CO2 22 - 29 mmol/L _1 Calcium 8.4 - 10.4 mg/dL 9.9 9.2 9.4  Total Protein 6.4 - 8.3 g/dL 7.6 7.1 7.6  Total Bilirubin 0.2 - 1.2 mg/dL 0.8 0.6 0.8  Alkaline Phos 40 - 150 U/L 120 110 113  AST 5 - 34 U/L _2 ALT 0 - 55 U/L _3 CEA ([facility])  10/27/2016: 7.81 11/27/2016: 5.56 01/10/2017: 5.61 03/14/17: 5.11 04/11/17: 5.87 05/23/17: 8.79 06/19/17:  9.15 07/16/17:  9.88 08/10/17: 10.40 09/18/17: 8.31 10/30/17: 8.11 11/20/17: 7.96 12/25/17: 13.95    PATHOLOGY:  10/29/17 Diagnosis Lymph node, needle/core biopsy, left supraclavicular - METASTATIC ADENOCARCINOMA, CONSISTENT WITH COLORECTAL PRIMARY. - SEE COMMENT. Microscopic Comment The malignant cells are positive for CDX-2 and cytokeratin 20. They are negative for cytokeratin 7, p63 and cytokeratin 5/6. The findings are  consistent with primary colorectal adenocarcinoma. Dr. Thressa Sheller has reviewed the case and concurs with this interpretation. Dr. Burr Medico was paged on 10/30/17. Additional studies can be performed upon clinician request. (JBK:gt, 10/31/17)    RADIOGRAPHIC STUDIES: I have personally reviewed the radiological images as listed and agreed with the findings in the  report. No results found.   ASSESSMENT & PLAN: 62 y.o.  gentleman, previously healthy, presented with a anal mass and a mild intermittent rectal bleeding for 5-6 months. Fatigue, anorexia, fatigue and weight loss for a month. He is currently on radiation for his rectal cancer, chemotherapy has been held lately.  1. Rectal Adenocarcinoma, low rectum, cT3N1bM0, ypT3N0, stage IIIB, MSI-stable, KRAS/NRAS wild type; nodes metastasis 09/2017  2. 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 Peripheral neuropathy, secondary to chemotherapy, G1 4. Goals of care discussion  5. Right sacral and anterolateral upper leg pain with associated numbness to mid-thigh, likely related to iliac adenopathy and psoas involvement  6. Constipation   David Clarke appears stable. He completed cycle 2 FOLFIRI on 12/06/17. He is tolerating well overall. Labs reviewed, he is neutropenic ANC 0.8. CBC otherwise stable, CMP unremarkable. Will postpone chemotherapy for neutropenia, will get insurance approval and plan to give granix with cycle 3. We discussed side effects and management including bone pain and claritin. He agrees. We reviewed neutropenic precautions. CEA is slightly elevated, 13.95 today. Will continue to monitor closely. We discussed [facility] report; his tumor is KRAS/NRAS wild type, he is eligible for EGFR antibody panitumumab which is pending insurance approval, plan to begin with cycle 3 next week. Side effects reviewed, such as GI and skin toxicities. He agrees to  proceed. He will get OTC hydrocortisone and I called in clindagel for him to apply BID with skin rash.   His sacral and anterolateral right leg pain is increasing, not well controlled with tylenol, tramadol, and gabapentin. He continues to work and does not want to escalate pain medication due to side effect profile. Dr. Burr Medico recommends referral back to Dr. Lisbeth Renshaw for consideration of palliative radiation. I sent him a message today.   PLAN -Labs and FO reviewed, postpone cycle 3 FOLFIRI to next week for neutropenia ANC 0.8 -Add panitumumab and granix (on day 3) with cycle 3 -Lab, flush, f/u, FOLFIRI/panitumumab in 3 weeks  -Monitor CEA -Hydrocortisone and clindagel for panitumumab skin rash -Refilled tramadol -Referred back to Dr. Lisbeth Renshaw for palliative RT for leg pain and numbness  All questions were answered. The patient knows to call the clinic with any problems, questions or concerns. No barriers to learning was detected. I spent 20 minutes counseling the patient face to face. The total time spent in the appointment was 25 minutes and more than 50% was on counseling and review of test results     Alla Feeling, NP 12/25/17   Addendum  I have seen the patient, examined him. I agree with the assessment and and plan and have edited the notes.   David Clarke is clinically doing well overall and tolerated the first two cycle chemo well. His right back pian is getting worse, and he has been taking more pain meds.  He still able to function very well.  I will refer him back to Dr. Lisbeth Renshaw to consider palliative radiation to the right common iliac adenopathy, which likely causing his pain.  I reviewed his [facility] results with patient, which showed K-ras and NRAS wild type, he is eligible for EGFR inhibitor, I recommend adding Panitumumab to his chemo regiment.  Potential benefits and side effects, especially skin rash, diarrhea, and management were discussed with him and his wife in details.  He  agrees to proceed.  He has significant neutropenia from last cycle chemo, has  not recovered well, will postpone his chemo to next week, remove 5-fu bolus and and add granix on day 3 after chemo.   Truitt Merle  12/25/2017

## 2017-12-27 ENCOUNTER — Encounter: Payer: Self-pay | Admitting: Radiation Oncology

## 2017-12-27 ENCOUNTER — Encounter (HOSPITAL_COMMUNITY): Payer: Self-pay | Admitting: Hematology

## 2018-01-01 ENCOUNTER — Telehealth: Payer: Self-pay | Admitting: Medical Oncology

## 2018-01-01 NOTE — Progress Notes (Signed)
Histology and Location of Primary Cancer: Metastatic adenocarcinoma consistent with colorectal primary.  Location(s) of Symptomatic tumor(s):   10/29/17 Relapse/Recurrence     Diagnosis Lymph node, needle/core biopsy, left supraclavicular - METASTATIC ADENOCARCINOMA, CONSISTENT WITH COLORECTAL PRIMARY. - SEE COMMENT.   PET 10/17/2017 IMPRESSION: -Increased hypermetabolic lymphadenopathy in abdominal retroperitoneum, right common iliac chain, and left supraclavicular region, consistent with metastatic disease. -Stable tiny bilateral pulmonary nodules show no FDG uptake but are too small to characterize by PET. Recommend continued attention on follow-up CT. 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  CT CAP W Contrast 09/18/17 IMPRESSION: -Interval abdomino-peritoneal resection with left lower quadrant colostomy. -Increased diffuse cystitis, likely due to radiation. New presacral soft tissue density with central low attenuation, also suspicious for post treatment changes. Recommend continued attention on follow-up imaging. -New mild retroperitoneal lymphadenopathy in the aortocaval space, measuring up to 1.8 cm. This is suspicious for metastatic disease, although it is conceivable that this could be reactive in etiology due to interval surgery and radiation therapy. Recommend short-term follow-up by CT in 3 months. -New indeterminate 4 mm left upper lobe pulmonary nodule, likely inflammatory in etiology with metastatic disease considered less likely. Recommend continued attention on follow-up CT. -Bilateral inguinal hernias, largest on the right containing several small bowel loops.   Past/Anticipated chemotherapy by medical oncology, if any:  Dr. Burr Medico NP [facility] 12/25/2017 -Labs and FO reviewed, postpone cycle 3 FOLFIRI to next week for neutropenia ANC 0.8 -Add panitumumab and granix (on day 3) with cycle 3 -Lab, flush, f/u, FOLFIRI/panitumumab in 3 weeks   -Monitor CEA -Hydrocortisone and clindagel for panitumumab skin rash -Refilled tramadol -Referred back to Dr. Lisbeth Renshaw for palliative RT for leg pain and numbnes  Patient's main complaints related to symptomatic tumor(s) are: Increasing right sacral and anterolateral leg pain with numbness to mid thigh.  Pain on a scale of 0-10 is: 4-5/10 in his right hip sitting here relaxed.   If Spine Met(s), symptoms, if any, include:  Bowel/Bladder retention or incontinence (please describe): Having some constipation, taking miralax.  Numbness or weakness in extremities (please describe): Numbness in the top of his right thigh, peripheral neuropathy in his feet.  Current Decadron regimen, if applicable: Not currently taking.  Ambulatory status? Walker? Wheelchair?: Uses wheelchair for long distances, uses cane otherwise.  BP 112/79   Pulse 78   Temp 97.7 F (36.5 C)   Resp 18   Ht 6' (1.829 m)   Wt 139 lb 6.4 oz (63.2 kg)   SpO2 100%   BMI 18.91 kg/m   Wt Readings from Last 3 Encounters:  12/25/17 141 lb 9.6 oz (64.2 kg)  12/06/17 147 lb 1.6 oz (66.7 kg)  11/20/17 149 lb 6.4 oz (67.8 kg)    SAFETY ISSUES:  Prior radiation? Yes, Pelvis 45 Gy in 25 fractions, Rectum boost 9 Gy in 5 fractions  Pacemaker/ICD? No  Possible current pregnancy? No  Is the patient on methotrexate? No  Additional Complaints / other details:

## 2018-01-01 NOTE — Telephone Encounter (Signed)
Pt asking about appt with Burr Medico this week. I told him this week is a r/s from last week for labs and chemo only. He understands.

## 2018-01-02 ENCOUNTER — Ambulatory Visit
Admission: RE | Admit: 2018-01-02 | Discharge: 2018-01-02 | Disposition: A | Discharge status: Home / Self Care | Payer: BLUE CROSS/BLUE SHIELD | Source: Ambulatory Visit | Attending: Radiation Oncology | Admitting: Radiation Oncology

## 2018-01-02 ENCOUNTER — Other Ambulatory Visit: Payer: Self-pay

## 2018-01-02 ENCOUNTER — Other Ambulatory Visit: Payer: Self-pay | Admitting: Nurse Practitioner

## 2018-01-02 ENCOUNTER — Telehealth: Payer: Self-pay

## 2018-01-02 ENCOUNTER — Encounter: Payer: Self-pay | Admitting: Radiation Oncology

## 2018-01-02 VITALS — BP 112/79 | HR 78 | Temp 97.7°F | Resp 18 | Ht 72.0 in | Wt 139.4 lb

## 2018-01-02 DIAGNOSIS — Z9221 Personal history of antineoplastic chemotherapy: Secondary | ICD-10-CM | POA: Insufficient documentation

## 2018-01-02 DIAGNOSIS — Z923 Personal history of irradiation: Secondary | ICD-10-CM | POA: Insufficient documentation

## 2018-01-02 DIAGNOSIS — C800 Disseminated malignant neoplasm, unspecified: Secondary | ICD-10-CM

## 2018-01-02 DIAGNOSIS — C20 Malignant neoplasm of rectum: Secondary | ICD-10-CM | POA: Insufficient documentation

## 2018-01-02 NOTE — Progress Notes (Addendum)
Radiation Oncology         [phone] ________________________________  Name: David Clarke MRN: [id number]  Date: 01/02/2018  DOB: 1955/09/28  JO:[id number], Clarke Shadow, MD  Truitt Merle, MD     REFERRING PHYSICIAN: Truitt Merle, MD   DIAGNOSIS: The encounter diagnosis was Widespread metastatic malignant neoplastic disease ([place]).   HISTORY OF PRESENT ILLNESS: David Clarke is a 62 y.o. 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 He completed chemoRT between March and April of 2018. He did have brisk skin reaction and diarrhea but was able to complete his course. He followed with surgical resection on 02/21/18 with robotic APR and end colostomy with Dr. Johney Clarke. His final pathology revelaed a .9 cm area of adenocarcinoma invading the muscularis and all 8 nodes were negative for disease. He completed CAPOX for 6 cycles, and had repeat imaging in January 2019 revealed a new area of retroperitoneal adenopathy in the aortocaval space measuring 1.8 cm, as well as an indeterminate 4 mm LUL nodule He had a PET scan on 10/17/17 revealed hypermetabolism in the abdominal retroperitoneum, right common iliac chain, and left supraclavicular region concerning for metastatic disease. He underwent left supraclavicular node biopsy on 10/29/17 which confirmed metastatic adenocarcinoma consistent with his known primary. He began FOLFIRI chemotherapy on 11/20/17. He has noticed progressive pain in the right hip through the thigh on the right side. He was advised to return to discuss options of palliative radiotherapy to the adenopathy in the pelvis.  PREVIOUS RADIATION THERAPY: Yes  11/08/16-12/19/16: Pelvis/ 45 Gy in 25 fractions Rectum boost/ 9 Gy in 5 fractions  PAST MEDICAL HISTORY:  Past Medical History:  Diagnosis Date  . Allergy   . Cancer ([place]) 10/09/2016   rectal adenocarcinoma  . Inguinal hernia        PAST SURGICAL HISTORY: Past Surgical History:  Procedure Laterality Date  .  COLONOSCOPY W/ BIOPSIES Bilateral 10/09/2016   rectal adenocarcinoma  . IR FLUORO GUIDE PORT INSERTION RIGHT  11/13/2017  . IR US GUIDE VASC ACCESS RIGHT  11/13/2017  . MOUTH SURGERY     age 73, to remove extra "eye" teeth  . [place] teeth extraction    . XI ROBOT ABDOMINAL PERINEAL RESECTION N/A 02/21/2017   Procedure: XI ROBOT ABDOMINOPERINEAL RESECTION WITH COLOSTOMY;  Surgeon: Michael Boston, MD;  Location: [facility];  Service: General;  Laterality: N/A;     FAMILY HISTORY:  Family History  Problem Relation Age of Onset  . Other Mother        bile duct tumor  . Stroke Mother   . Cancer Mother 48       bile duct cancer   . Prostate cancer Father 39  . Emphysema Father   . Colon cancer Neg Hx   . Esophageal cancer Neg Hx   . Pancreatic cancer Neg Hx   . Rectal cancer Neg Hx   . Stomach cancer Neg Hx      SOCIAL HISTORY:  reports that he quit smoking about 17 years ago. His smoking use included cigarettes. He quit after 2.00 years of use. He has never used smokeless tobacco. He reports that he drank about 6.0 oz of alcohol per week. He reports that he does not use drugs. The patient is in a relationshipw with [name], his partner. He works in [employer].  ALLERGIES: Sulfa antibiotics   MEDICATIONS:  Current Outpatient Medications  Medication Sig Dispense Refill  . acetaminophen (TYLENOL) 500 MG tablet Take  500 mg by mouth every 6 (six) hours as needed.    . clindamycin (CLINDAGEL) 1 % gel Apply topically 2 (two) times daily. 60 g 1  . gabapentin (NEURONTIN) 100 MG capsule Take 1 capsule (100 mg total) by mouth at bedtime. 30 capsule 0  . lidocaine-prilocaine (EMLA) cream Apply 1 application topically as needed. 30 g 2  . loperamide (IMODIUM A-D) 2 MG tablet Take 1 tablet (2 mg total) by mouth 4 (four) times daily as needed. Take 2 at diarrhea onset , then 1 every 2hr until 12hrs with no BM. May take 2 every 4hrs at night. If diarrhea recurs repeat. 100 tablet 1  .  Multiple Vitamin (MULTIVITAMIN WITH MINERALS) TABS tablet Take 1 tablet by mouth every other day.     . traMADol (ULTRAM) 50 MG tablet Take 1-2 tablets (50-100 mg total) by mouth every 8 (eight) hours as needed. 60 tablet 0  . Cyanocobalamin (VITAMIN B 12 PO) Take 1 tablet by mouth daily.    Marland Kitchen dexamethasone (DECADRON) 4 MG tablet Take 2 tablets (8 mg total) by mouth daily. Start the day after chemo for 2 days. (Patient not taking: Reported on 12/06/2017) 8 tablet 5  . LORazepam (ATIVAN) 1 MG tablet Take 1 tablet (1 mg total) by mouth every 6 (six) hours as needed (NAUSEA). (Patient not taking: Reported on 12/06/2017) 30 tablet 0  . ondansetron (ZOFRAN) 8 MG tablet Take 1 tablet (8 mg total) by mouth 2 (two) times daily as needed for refractory nausea / vomiting. Start on day 3 after chemotherapy. (Patient not taking: Reported on 12/25/2017) 30 tablet 1  . prochlorperazine (COMPAZINE) 10 MG tablet Take 1 tablet (10 mg total) by mouth every 6 (six) hours as needed (NAUSEA). (Patient not taking: Reported on 12/25/2017) 30 tablet 1   Current Facility-Administered Medications  Medication Dose Route Frequency Provider Last Rate Last Dose  . 0.9 %  sodium chloride infusion  500 mL Intravenous Continuous Pyrtle, Lajuan Lines, MD      . 0.9 %  sodium chloride infusion  500 mL Intravenous Once Pyrtle, Lajuan Lines, MD         REVIEW OF SYSTEMS: On review of systems, the patient reports that he is doing well overall. He denies any chest pain, shortness of breath, cough, fevers, chills, or night sweats. Patient notes about 15 pounds of weight loss in the last two months, progressive numbness and shooting pain from the right hip into the thigh. He is taking Ultram at night, and tylenol during the day. His symptoms do not seem to be alleviated by his regimen.  He is somewhat constipated, and is going to start cathartics for this. He denies nausea or vomiting. He denies any new musculoskeletal or joint aches or pains. A complete  review of systems is obtained and is otherwise negative.  PHYSICAL EXAM:  Wt Readings from Last 3 Encounters:  01/02/18 139 lb 6.4 oz (63.2 kg)  12/25/17 141 lb 9.6 oz (64.2 kg)  12/06/17 147 lb 1.6 oz (66.7 kg)   Temp Readings from Last 3 Encounters:  01/02/18 97.7 F (36.5 C)  12/25/17 97.6 F (36.4 C) (Oral)  12/08/17 97.7 F (36.5 C) (Oral)   BP Readings from Last 3 Encounters:  01/02/18 112/79  12/25/17 (!) 141/81  12/08/17 (!) 143/89   Pulse Readings from Last 3 Encounters:  01/02/18 78  12/25/17 63  12/08/17 (!) 58   Pain Assessment Pain Score: 4 /10  In general this is a  well appearing caucasian gentleman in no acute distress. Cardiopulmonary assessment is negative for acute distress and he exhibits normal effort.    ECOG = 1  0 - Asymptomatic (Fully active, able to carry on all predisease activities without restriction)  1 - Symptomatic but completely ambulatory (Restricted in physically strenuous activity but ambulatory and able to carry out work of a light or sedentary nature. For example, light housework, office work)  2 - Symptomatic, <50% in bed during the day (Ambulatory and capable of all self care but unable to carry out any work activities. Up and about more than 50% of waking hours)  3 - Symptomatic, >50% in bed, but not bedbound (Capable of only limited self-care, confined to bed or chair 50% or more of waking hours)  4 - Bedbound (Completely disabled. Cannot carry on any self-care. Totally confined to bed or chair)  5 - Death   Eustace Pen MM, Creech RH, Tormey DC, et al. [phone]). "Toxicity and response criteria of the [facility]". [place] Oncol. 5 (6): 649-55    LABORATORY DATA:  Lab Results  Component Value Date   WBC 2.0 (L) 12/25/2017   HGB 12.2 (L) 12/25/2017   HCT 35.6 (L) 12/25/2017   MCV 96.2 12/25/2017   PLT 351 12/25/2017   Lab Results  Component Value Date   NA 134 (L) 12/25/2017   K 3.8 12/25/2017    CL 101 12/25/2017   CO2 24 12/25/2017   Lab Results  Component Value Date   ALT 15 12/25/2017   AST 21 12/25/2017   ALKPHOS 120 12/25/2017   BILITOT 0.8 12/25/2017      RADIOGRAPHY: No results found.     IMPRESSION/PLAN: 1. Recurrent progressive stage IIIB, [id number] adenocarcinoma of the rectum with disease in the lymphatics. Dr. Lisbeth Renshaw outlines the options to consider radiotherapy to the adenopathy within the pelvis particularly along the iliac region/psoas region. He outlines the risks associated with treatment overlap, and the use of chemotherapy in the setting of considering palliative radiotherapy. Dr. Lisbeth Renshaw discusses the delivery and logistics of radiotherapy and anticipates a course of 3 weeks of radiotherapy due to his chemotherapy. Written consent is obtained and placed in the chart, a copy was provided to the patient. He will return for simulation, and we would hope to proceed tomorrow if possible while he's here for therapy.  In a visit lasting 40 minutes, greater than 50% of the time was spent face to face discussing his case, and coordinating the patient's care.  The above documentation reflects my direct findings during this shared patient visit. Please see the separate note by Dr. Lisbeth Renshaw on this date for the remainder of the patient's plan of care.     Carola Rhine, PAC

## 2018-01-02 NOTE — Telephone Encounter (Signed)
Patient called stating he received a copy from [facility] they work with [employer].  He is requesting Dr. Burr Medico call him and explain this to him.  Has an infusion appointment tomorrow 01/03/18

## 2018-01-02 NOTE — Telephone Encounter (Signed)
I called pt and asked him to bring the letter to [place] tomorrow. Pt states it's about the denial of vectibix. I will check with Karena Addison to see if the appeal was done and approved. Thanks   Truitt Merle MD

## 2018-01-03 ENCOUNTER — Ambulatory Visit
Admission: RE | Admit: 2018-01-03 | Discharge: 2018-01-03 | Disposition: A | Discharge status: Home / Self Care | Payer: BLUE CROSS/BLUE SHIELD | Source: Ambulatory Visit | Attending: Radiation Oncology | Admitting: Radiation Oncology

## 2018-01-03 ENCOUNTER — Inpatient Hospital Stay: Payer: BLUE CROSS/BLUE SHIELD

## 2018-01-03 ENCOUNTER — Ambulatory Visit: Payer: BLUE CROSS/BLUE SHIELD | Admitting: Hematology

## 2018-01-03 ENCOUNTER — Inpatient Hospital Stay: Payer: BLUE CROSS/BLUE SHIELD | Attending: Hematology

## 2018-01-03 ENCOUNTER — Other Ambulatory Visit: Payer: Self-pay | Admitting: Hematology

## 2018-01-03 DIAGNOSIS — Z148 Genetic carrier of other disease: Secondary | ICD-10-CM | POA: Diagnosis not present

## 2018-01-03 DIAGNOSIS — R5383 Other fatigue: Secondary | ICD-10-CM | POA: Diagnosis not present

## 2018-01-03 DIAGNOSIS — Z5111 Encounter for antineoplastic chemotherapy: Secondary | ICD-10-CM | POA: Insufficient documentation

## 2018-01-03 DIAGNOSIS — T451X5A Adverse effect of antineoplastic and immunosuppressive drugs, initial encounter: Secondary | ICD-10-CM | POA: Insufficient documentation

## 2018-01-03 DIAGNOSIS — C20 Malignant neoplasm of rectum: Secondary | ICD-10-CM

## 2018-01-03 DIAGNOSIS — C7800 Secondary malignant neoplasm of unspecified lung: Secondary | ICD-10-CM | POA: Diagnosis not present

## 2018-01-03 DIAGNOSIS — B370 Candidal stomatitis: Secondary | ICD-10-CM | POA: Diagnosis not present

## 2018-01-03 DIAGNOSIS — C770 Secondary and unspecified malignant neoplasm of lymph nodes of head, face and neck: Secondary | ICD-10-CM | POA: Diagnosis not present

## 2018-01-03 DIAGNOSIS — Z510 Encounter for antineoplastic radiation therapy: Secondary | ICD-10-CM | POA: Insufficient documentation

## 2018-01-03 DIAGNOSIS — Z87891 Personal history of nicotine dependence: Secondary | ICD-10-CM | POA: Insufficient documentation

## 2018-01-03 DIAGNOSIS — L709 Acne, unspecified: Secondary | ICD-10-CM | POA: Insufficient documentation

## 2018-01-03 DIAGNOSIS — R209 Unspecified disturbances of skin sensation: Secondary | ICD-10-CM | POA: Insufficient documentation

## 2018-01-03 DIAGNOSIS — M79604 Pain in right leg: Secondary | ICD-10-CM | POA: Diagnosis not present

## 2018-01-03 DIAGNOSIS — Z5189 Encounter for other specified aftercare: Secondary | ICD-10-CM | POA: Diagnosis not present

## 2018-01-03 DIAGNOSIS — M533 Sacrococcygeal disorders, not elsewhere classified: Secondary | ICD-10-CM | POA: Diagnosis not present

## 2018-01-03 DIAGNOSIS — Z5112 Encounter for antineoplastic immunotherapy: Secondary | ICD-10-CM | POA: Diagnosis present

## 2018-01-03 DIAGNOSIS — G620 Drug-induced polyneuropathy: Secondary | ICD-10-CM | POA: Insufficient documentation

## 2018-01-03 DIAGNOSIS — C787 Secondary malignant neoplasm of liver and intrahepatic bile duct: Secondary | ICD-10-CM | POA: Insufficient documentation

## 2018-01-03 DIAGNOSIS — C7989 Secondary malignant neoplasm of other specified sites: Secondary | ICD-10-CM | POA: Diagnosis not present

## 2018-01-03 DIAGNOSIS — K5900 Constipation, unspecified: Secondary | ICD-10-CM | POA: Insufficient documentation

## 2018-01-03 DIAGNOSIS — D500 Iron deficiency anemia secondary to blood loss (chronic): Secondary | ICD-10-CM | POA: Diagnosis not present

## 2018-01-03 LAB — CBC WITH DIFFERENTIAL/PLATELET
BASOS ABS: 0.1 10*3/uL (ref 0.0–0.1)
Basophils Relative: 1 %
EOS PCT: 0 %
Eosinophils Absolute: 0 10*3/uL (ref 0.0–0.5)
HCT: 34.1 % — ABNORMAL LOW (ref 38.4–49.9)
Hemoglobin: 11.5 g/dL — ABNORMAL LOW (ref 13.0–17.1)
LYMPHS ABS: 0.4 10*3/uL — AB (ref 0.9–3.3)
Lymphocytes Relative: 4 %
MCH: 32.8 pg (ref 27.2–33.4)
MCHC: 33.7 g/dL (ref 32.0–36.0)
MCV: 97.3 fL (ref 79.3–98.0)
Monocytes Absolute: 0.9 10*3/uL (ref 0.1–0.9)
Monocytes Relative: 8 %
Neutro Abs: 9.2 10*3/uL — ABNORMAL HIGH (ref 1.5–6.5)
Neutrophils Relative %: 87 %
PLATELETS: 376 10*3/uL (ref 140–400)
RBC: 3.51 MIL/uL — ABNORMAL LOW (ref 4.20–5.82)
RDW: 16.1 % — ABNORMAL HIGH (ref 11.0–14.6)
WBC: 10.5 10*3/uL — ABNORMAL HIGH (ref 4.0–10.3)

## 2018-01-03 LAB — COMPREHENSIVE METABOLIC PANEL
ALT: 17 U/L (ref 0–55)
ANION GAP: 10 (ref 3–11)
AST: 23 U/L (ref 5–34)
Albumin: 3.8 g/dL (ref 3.5–5.0)
Alkaline Phosphatase: 156 U/L — ABNORMAL HIGH (ref 40–150)
BUN: 15 mg/dL (ref 7–26)
CHLORIDE: 98 mmol/L (ref 98–109)
CO2: 25 mmol/L (ref 22–29)
Calcium: 10.3 mg/dL (ref 8.4–10.4)
Creatinine, Ser: 0.86 mg/dL (ref 0.70–1.30)
GFR calc non Af Amer: >60 mL/min (ref >60)
Glucose, Bld: 106 mg/dL (ref 70–140)
POTASSIUM: 4.2 mmol/L (ref 3.5–5.1)
SODIUM: 133 mmol/L — AB (ref 136–145)
Total Bilirubin: 0.7 mg/dL (ref 0.2–1.2)
Total Protein: 8.3 g/dL (ref 6.4–8.3)

## 2018-01-03 LAB — MAGNESIUM: Magnesium: 2.2 mg/dL (ref 1.7–2.4)

## 2018-01-03 MED ORDER — SODIUM CHLORIDE 0.9 % IV SOLN
2400.0000 mg/m2 | INTRAVENOUS | Status: DC
  Administered 2018-01-03: 4550 mg via INTRAVENOUS
  Filled 2018-01-03: qty 91

## 2018-01-03 MED ORDER — LIDOCAINE-PRILOCAINE 2.5-2.5 % EX CREA: TOPICAL_CREAM | CUTANEOUS | 3 refills | Status: DC

## 2018-01-03 MED ORDER — LEUCOVORIN CALCIUM INJECTION 350 MG
400.0000 mg/m2 | Freq: Once | INTRAVENOUS | Status: AC
  Administered 2018-01-03: 756 mg via INTRAVENOUS
  Filled 2018-01-03: qty 37.8

## 2018-01-03 MED ORDER — SODIUM CHLORIDE 0.9 % IV SOLN: Freq: Once | INTRAVENOUS | Status: DC

## 2018-01-03 MED ORDER — ATROPINE SULFATE 1 MG/ML IJ SOLN: 0.5000 mg | Freq: Once | INTRAMUSCULAR | Status: DC | PRN

## 2018-01-03 MED ORDER — IRINOTECAN HCL CHEMO INJECTION 100 MG/5ML
180.0000 mg/m2 | Freq: Once | INTRAVENOUS | Status: AC
  Administered 2018-01-03: 340 mg via INTRAVENOUS
  Filled 2018-01-03: qty 17

## 2018-01-03 MED ORDER — DEXAMETHASONE SODIUM PHOSPHATE 10 MG/ML IJ SOLN: 10.0000 mg | Freq: Once | INTRAMUSCULAR | Status: DC

## 2018-01-03 MED ORDER — SODIUM CHLORIDE 0.9 % IV SOLN
Freq: Once | INTRAVENOUS | Status: AC
  Administered 2018-01-03: 10:00:00 via INTRAVENOUS

## 2018-01-03 MED ORDER — PALONOSETRON HCL INJECTION 0.25 MG/5ML
INTRAVENOUS | Status: AC
  Filled 2018-01-03: qty 5

## 2018-01-03 MED ORDER — PALONOSETRON HCL INJECTION 0.25 MG/5ML
0.2500 mg | Freq: Once | INTRAVENOUS | Status: AC
  Administered 2018-01-03: 0.25 mg via INTRAVENOUS

## 2018-01-03 MED ORDER — SODIUM CHLORIDE 0.9 % IV SOLN
6.0000 mg/kg | Freq: Once | INTRAVENOUS | Status: AC
  Administered 2018-01-03: 400 mg via INTRAVENOUS
  Filled 2018-01-03: qty 20

## 2018-01-03 NOTE — Progress Notes (Signed)
Vectibix given no reaction.

## 2018-01-03 NOTE — Patient Instructions (Addendum)
[facility] Discharge Instructions for Patients Receiving Chemotherapy  Today you received the following chemotherapy agents Leucovorin, Irinotecan, 5FU  To help prevent nausea and vomiting after your treatment, we encourage you to take your nausea medication as prescribed.   If you develop nausea and vomiting that is not controlled by your nausea medication, call the clinic.   BELOW ARE SYMPTOMS THAT SHOULD BE REPORTED IMMEDIATELY:  *FEVER GREATER THAN 100.5 F  *CHILLS WITH OR WITHOUT FEVER  NAUSEA AND VOMITING THAT IS NOT CONTROLLED WITH YOUR NAUSEA MEDICATION  *UNUSUAL SHORTNESS OF BREATH  *UNUSUAL BRUISING OR BLEEDING  TENDERNESS IN MOUTH AND THROAT WITH OR WITHOUT PRESENCE OF ULCERS  *URINARY PROBLEMS  *BOWEL PROBLEMS  UNUSUAL RASH Items with * indicate a potential emergency and should be followed up as soon as possible.  Feel free to call the clinic should you have any questions or concerns. The clinic phone number is [phone].  Please show the [facility] at check-in to the Emergency Department and triage nurse.   Leucovorin injection What is this medicine? LEUCOVORIN (loo koe VOR in) is used to prevent or treat the harmful effects of some medicines. This medicine is used to treat anemia caused by a low amount of folic acid in the body. It is also used with 5-fluorouracil (5-FU) to treat colon cancer. This medicine may be used for other purposes; ask your health care provider or pharmacist if you have questions. What should I tell my health care provider before I take this medicine? They need to know if you have any of these conditions: -anemia from low levels of vitamin B-12 in the blood -an unusual or allergic reaction to leucovorin, folic acid, other medicines, foods, dyes, or preservatives -pregnant or trying to get pregnant -breast-feeding How should I use this medicine? This medicine is for injection into a muscle or into a vein. It  is given by a health care professional in a hospital or clinic setting. Talk to your pediatrician regarding the use of this medicine in children. Special care may be needed. Overdosage: If you think you have taken too much of this medicine contact a poison control center or emergency room at once. NOTE: This medicine is only for you. Do not share this medicine with others. What if I miss a dose? This does not apply. What may interact with this medicine? -capecitabine -fluorouracil -phenobarbital -phenytoin -primidone -trimethoprim-sulfamethoxazole This list may not describe all possible interactions. Give your health care provider a list of all the medicines, herbs, non-prescription drugs, or dietary supplements you use. Also tell them if you smoke, drink alcohol, or use illegal drugs. Some items may interact with your medicine. What should I watch for while using this medicine? Your condition will be monitored carefully while you are receiving this medicine. This medicine may increase the side effects of 5-fluorouracil, 5-FU. Tell your doctor or health care professional if you have diarrhea or mouth sores that do not get better or that get worse. What side effects may I notice from receiving this medicine? Side effects that you should report to your doctor or health care professional as soon as possible: -allergic reactions like skin rash, itching or hives, swelling of the face, lips, or tongue -breathing problems -fever, infection -mouth sores -unusual bleeding or bruising -unusually weak or tired Side effects that usually do not require medical attention (report to your doctor or health care professional if they continue or are bothersome): -constipation or diarrhea -loss of appetite -  nausea, vomiting This list may not describe all possible side effects. Call your doctor for medical advice about side effects. You may report side effects to FDA at 1-800-FDA-1088. Where should I keep  my medicine? This drug is given in a hospital or clinic and will not be stored at home. NOTE: This sheet is a summary. It may not cover all possible information. If you have questions about this medicine, talk to your doctor, pharmacist, or health care provider.  2018 Elsevier/Gold Standard (2008-02-18 16:50:29)   Irinotecan injection What is this medicine? IRINOTECAN (ir in oh TEE kan ) is a chemotherapy drug. It is used to treat colon and rectal cancer. This medicine may be used for other purposes; ask your health care provider or pharmacist if you have questions. COMMON BRAND NAME(S): Camptosar What should I tell my health care provider before I take this medicine? They need to know if you have any of these conditions: -blood disorders -dehydration -diarrhea -infection (especially a virus infection such as chickenpox, cold sores, or herpes) -liver disease -low blood counts, like low white cell, platelet, or red cell counts -recent or ongoing radiation therapy -an unusual or allergic reaction to irinotecan, sorbitol, other chemotherapy, other medicines, foods, dyes, or preservatives -pregnant or trying to get pregnant -breast-feeding How should I use this medicine? This drug is given as an infusion into a vein. It is administered in a hospital or clinic by a specially trained health care professional. Talk to your pediatrician regarding the use of this medicine in children. Special care may be needed. Overdosage: If you think you have taken too much of this medicine contact a poison control center or emergency room at once. NOTE: This medicine is only for you. Do not share this medicine with others. What if I miss a dose? It is important not to miss your dose. Call your doctor or health care professional if you are unable to keep an appointment. What may interact with this medicine? Do not take this medicine with any of the following medications: -atazanavir -certain medicines for  fungal infections like itraconazole and ketoconazole -St. Nicklos's Wort This medicine may also interact with the following medications: -dexamethasone -diuretics -laxatives -medicines for seizures like carbamazepine, mephobarbital, phenobarbital, phenytoin, primidone -medicines to increase blood counts like filgrastim, pegfilgrastim, sargramostim -prochlorperazine -vaccines This list may not describe all possible interactions. Give your health care provider a list of all the medicines, herbs, non-prescription drugs, or dietary supplements you use. Also tell them if you smoke, drink alcohol, or use illegal drugs. Some items may interact with your medicine. What should I watch for while using this medicine? Your condition will be monitored carefully while you are receiving this medicine. You will need important blood work done while you are taking this medicine. This drug may make you feel generally unwell. This is not uncommon, as chemotherapy can affect healthy cells as well as cancer cells. Report any side effects. Continue your course of treatment even though you feel ill unless your doctor tells you to stop. In some cases, you may be given additional medicines to help with side effects. Follow all directions for their use. You may get drowsy or dizzy. Do not drive, use machinery, or do anything that needs mental alertness until you know how this medicine affects you. Do not stand or sit up quickly, especially if you are an older patient. This reduces the risk of dizzy or fainting spells. Call your doctor or health care professional for advice if you  get a fever, chills or sore throat, or other symptoms of a cold or flu. Do not treat yourself. This drug decreases your body's ability to fight infections. Try to avoid being around people who are sick. This medicine may increase your risk to bruise or bleed. Call your doctor or health care professional if you notice any unusual bleeding. Be careful  brushing and flossing your teeth or using a toothpick because you may get an infection or bleed more easily. If you have any dental work done, tell your dentist you are receiving this medicine. Avoid taking products that contain aspirin, acetaminophen, ibuprofen, naproxen, or ketoprofen unless instructed by your doctor. These medicines may hide a fever. Do not become pregnant while taking this medicine. Women should inform their doctor if they wish to become pregnant or think they might be pregnant. There is a potential for serious side effects to an unborn child. Talk to your health care professional or pharmacist for more information. Do not breast-feed an infant while taking this medicine. What side effects may I notice from receiving this medicine? Side effects that you should report to your doctor or health care professional as soon as possible: -allergic reactions like skin rash, itching or hives, swelling of the face, lips, or tongue -low blood counts - this medicine may decrease the number of white blood cells, red blood cells and platelets. You may be at increased risk for infections and bleeding. -signs of infection - fever or chills, cough, sore throat, pain or difficulty passing urine -signs of decreased platelets or bleeding - bruising, pinpoint red spots on the skin, black, tarry stools, blood in the urine -signs of decreased red blood cells - unusually weak or tired, fainting spells, lightheadedness -breathing problems -chest pain -diarrhea -feeling faint or lightheaded, falls -flushing, runny nose, sweating during infusion -mouth sores or pain -pain, swelling, redness or irritation where injected -pain, swelling, warmth in the leg -pain, tingling, numbness in the hands or feet -problems with balance, talking, walking -stomach cramps, pain -trouble passing urine or change in the amount of urine -vomiting as to be unable to hold down drinks or food -yellowing of the eyes or  skin Side effects that usually do not require medical attention (report to your doctor or health care professional if they continue or are bothersome): -constipation -hair loss -headache -loss of appetite -nausea, vomiting -stomach upset This list may not describe all possible side effects. Call your doctor for medical advice about side effects. You may report side effects to FDA at 1-800-FDA-1088. Where should I keep my medicine? This drug is given in a hospital or clinic and will not be stored at home. NOTE: This sheet is a summary. It may not cover all possible information. If you have questions about this medicine, talk to your doctor, pharmacist, or health care provider.  2018 Elsevier/Gold Standard (2013-02-10 16:29:32)   Fluorouracil, 5FU; Diclofenac topical cream What is this medicine? FLUOROURACIL; DICLOFENAC (flure oh YOOR a sil; dye KLOE fen ak) is a combination of a topical chemotherapy agent and non-steroidal anti-inflammatory drug (NSAID). It is used on the skin to treat skin cancer and skin conditions that could become cancer. This medicine may be used for other purposes; ask your health care provider or pharmacist if you have questions. COMMON BRAND NAME(S): FLUORAC What should I tell my health care provider before I take this medicine? They need to know if you have any of these conditions: -bleeding problems -cigarette smoker -DPD enzyme deficiency -heart disease -  high blood pressure -if you frequently drink alcohol containing drinks -kidney disease -liver disease -open or infected skin -stomach problems -swelling or open sores at the treatment site -recent or planned coronary artery bypass graft (CABG) surgery -an unusual or allergic reaction to fluorouracil, diclofenac, aspirin, other NSAIDs, other medicines, foods, dyes, or preservatives -pregnant or trying to get pregnant -breast-feeding How should I use this medicine? This medicine is only for use on the  skin. Follow the directions on the prescription label. Wash hands before and after use. Wash affected area and gently pat dry. To apply this medicine use a cotton-tipped applicator, or use gloves if applying with fingertips. If applied with unprotected fingertips, it is very important to wash your hands well after you apply this medicine. Avoid applying to the eyes, nose, or mouth. Apply enough medicine to cover the affected area. You can cover the area with a light gauze dressing, but do not use tight or air-tight dressings. Finish the full course prescribed by your doctor or health care professional, even if you think your condition is better. Do not stop taking except on the advice of your doctor or health care professional. Talk to your pediatrician regarding the use of this medicine in children. Special care may be needed. Overdosage: If you think you have taken too much of this medicine contact a poison control center or emergency room at once. NOTE: This medicine is only for you. Do not share this medicine with others. What if I miss a dose? If you miss a dose, apply it as soon as you can. If it is almost time for your next dose, only use that dose. Do not apply extra doses. Contact your doctor or health care professional if you miss more than one dose. What may interact with this medicine? Interactions are not expected. Do not use any other skin products without telling your doctor or health care professional. This list may not describe all possible interactions. Give your health care provider a list of all the medicines, herbs, non-prescription drugs, or dietary supplements you use. Also tell them if you smoke, drink alcohol, or use illegal drugs. Some items may interact with your medicine. What should I watch for while using this medicine? Visit your doctor or health care professional for checks on your progress. You will need to use this medicine for 2 to 6 weeks. This may be longer depending on  the condition being treated. You may not see full healing for another 1 to 2 months after you stop using the medicine. Treated areas of skin can look unsightly during and for several weeks after treatment with this medicine. This medicine can make you more sensitive to the sun. Keep out of the sun. If you cannot avoid being in the sun, wear protective clothing and use sunscreen. Do not use sun lamps or tanning beds/booths. If a pet comes in contact with the area where this medicine was applied to your skin or if it is ingested, they may have a serious risk of side effects. If accidental contact happens, the skin of the pet should be washed right away with soap and water. Contact your vet right away if your pet becomes exposed. Do not become pregnant while taking this medicine. Women should inform their doctor if they wish to become pregnant or think they might be pregnant. There is a potential for serious side effects to an unborn child. Talk to your health care professional or pharmacist for more information. What side effects  may I notice from receiving this medicine? Side effects that you should report to your doctor or health care professional as soon as possible: -allergic reactions like skin rash, itching or hives, swelling of the face, lips, or tongue -black or bloody stools, blood in the urine or vomit -blurred vision -chest pain -difficulty breathing or wheezing -redness, blistering, peeling or loosening of the skin, including inside the mouth -severe redness and swelling of normal skin -slurred speech or weakness on one side of the body -trouble passing urine or change in the amount of urine -unexplained weight gain or swelling -unusually weak or tired -yellowing of eyes or skin Side effects that usually do not require medical attention (report to your doctor or health care professional if they continue or are bothersome): -increased sensitivity of the skin to sun and ultraviolet  light -pain and burning of the affected area -scaling or swelling of the affected area -skin rash, itching of the affected area -tenderness This list may not describe all possible side effects. Call your doctor for medical advice about side effects. You may report side effects to FDA at 1-800-FDA-1088. Where should I keep my medicine? Keep out of the reach of children and pets. Store at room temperature between 20 and 25 degrees C (68 and 77 degrees F). Throw away any unused medicine after the expiration date. NOTE: This sheet is a summary. It may not cover all possible information. If you have questions about this medicine, talk to your doctor, pharmacist, or health care provider.  2018 Elsevier/Gold Standard (2015-09-24 17:35:08) Panitumumab Solution for Injection What is this medicine? PANITUMUMAB (pan i TOOM ue mab) is a monoclonal antibody. It is used to treat colorectal cancer. This medicine may be used for other purposes; ask your health care provider or pharmacist if you have questions. COMMON BRAND NAME(S): Vectibix What should I tell my health care provider before I take this medicine? They need to know if you have any of these conditions: -eye disease, vision problems -low levels of calcium, magnesium, or potassium in the blood -lung or breathing disease, like asthma -skin conditions or sensitivity -an unusual or allergic reaction to panitumumab, other medicines, foods, dyes, or preservatives -pregnant or trying to get pregnant -breast-feeding How should I use this medicine? This drug is given as an infusion into a vein. It is administered in a hospital or clinic by a specially trained health care professional. Talk to your pediatrician regarding the use of this medicine in children. Special care may be needed. Overdosage: If you think you have taken too much of this medicine contact a poison control center or emergency room at once. NOTE: This medicine is only for you. Do  not share this medicine with others. What if I miss a dose? It is important not to miss your dose. Call your doctor or health care professional if you are unable to keep an appointment. What may interact with this medicine? Do not take this medicine with any of the following medications: -bevacizumab This list may not describe all possible interactions. Give your health care provider a list of all the medicines, herbs, non-prescription drugs, or dietary supplements you use. Also tell them if you smoke, drink alcohol, or use illegal drugs. Some items may interact with your medicine. What should I watch for while using this medicine? Visit your doctor for checks on your progress. This drug may make you feel generally unwell. This is not uncommon, as chemotherapy can affect healthy cells as well as cancer  cells. Report any side effects. Continue your course of treatment even though you feel ill unless your doctor tells you to stop. This medicine can make you more sensitive to the sun. Keep out of the sun while receiving this medicine and for 2 months after the last dose. If you cannot avoid being in the sun, wear protective clothing and use sunscreen. Do not use sun lamps or tanning beds/booths. In some cases, you may be given additional medicines to help with side effects. Follow all directions for their use. Call your doctor or health care professional for advice if you get a fever, chills or sore throat, or other symptoms of a cold or flu. Do not treat yourself. This drug decreases your body's ability to fight infections. Try to avoid being around people who are sick. Avoid taking products that contain aspirin, acetaminophen, ibuprofen, naproxen, or ketoprofen unless instructed by your doctor. These medicines may hide a fever. Do not become pregnant while taking this medicine and for 2 months after the last dose. Women should inform their doctor if they wish to become pregnant or think they might be  pregnant. There is a potential for serious side effects to an unborn child. Talk to your health care professional or pharmacist for more information. Do not breast-feed an infant while taking this medicine or for 2 months after the last dose. What side effects may I notice from receiving this medicine? Side effects that you should report to your doctor or health care professional as soon as possible: -allergic reactions like skin rash, itching or hives, swelling of the face, lips, or tongue -breathing problems -changes in vision -eye pain -fast, irregular heartbeat -fever, chills -mouth sores -red spots on the skin -redness, blistering, peeling or loosening of the skin, including inside the mouth -signs and symptoms of kidney injury like trouble passing urine or change in the amount of urine -signs and symptoms of low blood pressure like dizziness; feeling faint or lightheaded, falls; unusually weak or tired -signs of low calcium like fast heartbeat, muscle cramps or muscle pain; pain, tingling, numbness in the hands or feet; seizures -signs and symptoms of low magnesium like muscle cramps, pain, or weakness; tremors; seizures; or fast, irregular heartbeat -signs and symptoms of low potassium like muscle cramps or muscle pain; chest pain; dizziness; feeling faint or lightheaded, falls; palpitations; breathing problems; or fast, irregular heartbeat -swelling of the ankles, feet, hands Side effects that usually do not require medical attention (report to your doctor or health care professional if they continue or are bothersome): -changes in skin like acne, cracks, skin dryness -diarrhea -eyelash growth -headache -mouth sores -nail changes -nausea, vomiting This list may not describe all possible side effects. Call your doctor for medical advice about side effects. You may report side effects to FDA at 1-800-FDA-1088. Where should I keep my medicine? This drug is given in a hospital or  clinic and will not be stored at home. NOTE: This sheet is a summary. It may not cover all possible information. If you have questions about this medicine, talk to your doctor, pharmacist, or health care provider.  2018 Elsevier/Gold Standard (2016-03-03 16:45:04)

## 2018-01-04 ENCOUNTER — Telehealth: Payer: Self-pay | Admitting: *Deleted

## 2018-01-04 NOTE — Telephone Encounter (Signed)
CALLED PATIENT TO INFORM OF CT FOR 01-08-18- ARRIVAL TIME - 6:45 AM @ [facility], PT. TO BE NPO- 4 HRS. PRIOR TO TEST, PATIENT TO PICK- UP CONTRAST IN RADIOLOGY BY Monday 01-07-18, SPOKE WITH PATIENT AND HE VERIFIED UNDERSTANDING THIS

## 2018-01-05 ENCOUNTER — Inpatient Hospital Stay: Payer: BLUE CROSS/BLUE SHIELD

## 2018-01-05 VITALS — BP 122/85 | HR 74 | Temp 98.1°F | Resp 18

## 2018-01-05 DIAGNOSIS — C20 Malignant neoplasm of rectum: Secondary | ICD-10-CM

## 2018-01-05 MED ORDER — TBO-FILGRASTIM 300 MCG/0.5ML ~~LOC~~ SOSY
300.0000 ug | PREFILLED_SYRINGE | Freq: Once | SUBCUTANEOUS | Status: AC
  Administered 2018-01-05: 300 ug via SUBCUTANEOUS

## 2018-01-05 MED ORDER — HEPARIN SOD (PORK) LOCK FLUSH 100 UNIT/ML IV SOLN
500.0000 [IU] | Freq: Once | INTRAVENOUS | Status: AC | PRN
  Administered 2018-01-05: 500 [IU]
  Filled 2018-01-05: qty 5

## 2018-01-05 MED ORDER — SODIUM CHLORIDE 0.9% FLUSH
10.0000 mL | INTRAVENOUS | Status: DC | PRN
  Administered 2018-01-05: 10 mL
  Filled 2018-01-05: qty 10

## 2018-01-05 MED ORDER — TBO-FILGRASTIM 300 MCG/0.5ML ~~LOC~~ SOSY
PREFILLED_SYRINGE | SUBCUTANEOUS | Status: AC
  Filled 2018-01-05: qty 0.5

## 2018-01-05 NOTE — Patient Instructions (Signed)
Tbo-Filgrastim injection What is this medicine? TBO-FILGRASTIM (T B O fil GRA stim) is a granulocyte colony-stimulating factor that stimulates the growth of neutrophils, a type of white blood cell important in the body's fight against infection. It is used to reduce the incidence of fever and infection in patients with certain types of cancer who are receiving chemotherapy that affects the bone marrow. This medicine may be used for other purposes; ask your health care provider or pharmacist if you have questions. COMMON BRAND NAME(S): Granix What should I tell my health care provider before I take this medicine? They need to know if you have any of these conditions: -bone scan or tests planned -kidney disease -sickle cell anemia -an unusual or allergic reaction to tbo-filgrastim, filgrastim, pegfilgrastim, other medicines, foods, dyes, or preservatives -pregnant or trying to get pregnant -breast-feeding How should I use this medicine? This medicine is for injection under the skin. If you get this medicine at home, you will be taught how to prepare and give this medicine. Refer to the Instructions for Use that come with your medication packaging. Use exactly as directed. Take your medicine at regular intervals. Do not take your medicine more often than directed. It is important that you put your used needles and syringes in a special sharps container. Do not put them in a trash can. If you do not have a sharps container, call your pharmacist or healthcare provider to get one. Talk to your pediatrician regarding the use of this medicine in children. Special care may be needed. Overdosage: If you think you have taken too much of this medicine contact a poison control center or emergency room at once. NOTE: This medicine is only for you. Do not share this medicine with others. What if I miss a dose? It is important not to miss your dose. Call your doctor or health care professional if you miss a  dose. What may interact with this medicine? This medicine may interact with the following medications: -medicines that may cause a release of neutrophils, such as lithium This list may not describe all possible interactions. Give your health care provider a list of all the medicines, herbs, non-prescription drugs, or dietary supplements you use. Also tell them if you smoke, drink alcohol, or use illegal drugs. Some items may interact with your medicine. What should I watch for while using this medicine? You may need blood work done while you are taking this medicine. What side effects may I notice from receiving this medicine? Side effects that you should report to your doctor or health care professional as soon as possible: -allergic reactions like skin rash, itching or hives, swelling of the face, lips, or tongue -blood in the urine -dark urine -dizziness -fast heartbeat -feeling faint -shortness of breath or breathing problems -signs and symptoms of infection like fever or chills; cough; or sore throat -signs and symptoms of kidney injury like trouble passing urine or change in the amount of urine -stomach or side pain, or pain at the shoulder -sweating -swelling of the legs, ankles, or abdomen -tiredness Side effects that usually do not require medical attention (report to your doctor or health care professional if they continue or are bothersome): -bone pain -headache -muscle pain -vomiting This list may not describe all possible side effects. Call your doctor for medical advice about side effects. You may report side effects to FDA at 1-800-FDA-1088. Where should I keep my medicine? Keep out of the reach of children. Store in a refrigerator between   2 and 8 degrees C (36 and 46 degrees F). Keep in carton to protect from light. Throw away this medicine if it is left out of the refrigerator for more than 5 consecutive days. Throw away any unused medicine after the expiration  date. NOTE: This sheet is a summary. It may not cover all possible information. If you have questions about this medicine, talk to your doctor, pharmacist, or health care provider.  2018 [employer] (2015-10-04 19:07:04)  

## 2018-01-07 ENCOUNTER — Other Ambulatory Visit: Payer: Self-pay | Admitting: Nurse Practitioner

## 2018-01-07 DIAGNOSIS — M79604 Pain in right leg: Secondary | ICD-10-CM

## 2018-01-07 DIAGNOSIS — C20 Malignant neoplasm of rectum: Secondary | ICD-10-CM

## 2018-01-07 MED ORDER — GABAPENTIN 100 MG PO CAPS: 100.0000 mg | ORAL_CAPSULE | Freq: Every day | ORAL | 0 refills | Status: DC

## 2018-01-08 ENCOUNTER — Ambulatory Visit (HOSPITAL_COMMUNITY)
Admission: RE | Admit: 2018-01-08 | Discharge: 2018-01-08 | Disposition: A | Discharge status: Home / Self Care | Payer: BLUE CROSS/BLUE SHIELD | Source: Ambulatory Visit | Attending: Radiation Oncology | Admitting: Radiation Oncology

## 2018-01-08 ENCOUNTER — Encounter (HOSPITAL_COMMUNITY): Payer: Self-pay

## 2018-01-08 DIAGNOSIS — C7800 Secondary malignant neoplasm of unspecified lung: Secondary | ICD-10-CM | POA: Insufficient documentation

## 2018-01-08 DIAGNOSIS — K4090 Unilateral inguinal hernia, without obstruction or gangrene, not specified as recurrent: Secondary | ICD-10-CM | POA: Insufficient documentation

## 2018-01-08 DIAGNOSIS — K769 Liver disease, unspecified: Secondary | ICD-10-CM | POA: Insufficient documentation

## 2018-01-08 DIAGNOSIS — C20 Malignant neoplasm of rectum: Secondary | ICD-10-CM

## 2018-01-08 MED ORDER — IOPAMIDOL (ISOVUE-300) INJECTION 61%
INTRAVENOUS | Status: AC
  Filled 2018-01-08: qty 100

## 2018-01-08 MED ORDER — HEPARIN SOD (PORK) LOCK FLUSH 100 UNIT/ML IV SOLN
INTRAVENOUS | Status: AC
  Filled 2018-01-08: qty 5

## 2018-01-08 MED ORDER — IOPAMIDOL (ISOVUE-300) INJECTION 61%
100.0000 mL | Freq: Once | INTRAVENOUS | Status: AC | PRN
  Administered 2018-01-08: 100 mL via INTRAVENOUS

## 2018-01-08 MED ORDER — HEPARIN SOD (PORK) LOCK FLUSH 100 UNIT/ML IV SOLN
500.0000 [IU] | Freq: Once | INTRAVENOUS | Status: AC
  Administered 2018-01-08: 500 [IU] via INTRAVENOUS

## 2018-01-09 ENCOUNTER — Telehealth: Payer: Self-pay | Admitting: Hematology

## 2018-01-09 NOTE — Progress Notes (Addendum)
[place]  [phone] [phone]  Clinic Follow Up Note   Patient Care Team: Robyn Haber, MD as PCP - General (Family Medicine) Michael Boston, MD as Consulting Physician (General Surgery) Pyrtle, Lajuan Lines, MD as Consulting Physician (Gastroenterology) Truitt Merle, MD as Consulting Physician (Hematology and Oncology) Kyung Rudd, MD as Consulting Physician (Radiation Oncology)   Date of Service:  01/10/2018  CHIEF COMPLAINTS:  Follow up rectal adenocarcinoma  Oncology History   Cancer Staging Rectal adenocarcinoma [facility]) Staging form: Colon and Rectum, AJCC 8th Edition - Clinical stage from 10/09/2016: Stage IIIB (cT3, cN1b, cM0) - Signed by Truitt Merle, MD on 10/25/2016 - Pathologic stage from 02/21/2017: Stage IIA (ypT3, pN0, cM0) - Signed by Truitt Merle, MD on 10/18/2017       Rectal adenocarcinoma ([place])   10/03/2016 - 10/03/2016 Hospital Admission    Admit date: 10/03/16 The patient presented to the [facility] ED with rectal bleeding and difficulty with hygiene.      10/09/2016 Initial Diagnosis    Rectal adenocarcinoma       10/09/2016 Procedure    Colonoscopy showed a fungating, infiltrative and ulcerated partially obstructing large mass in the distal rectum, measuring 9 cm, prolapsing at anal canal. Scope not advanced proximal to the rectosigmoid colon.      10/09/2016 Initial Biopsy    Rectal mass biopsy showed invasive adenocarcinoma.      10/13/2016 Imaging    CT Chest Abdomen Pelvis w contrast IMPRESSION: Large irregular enhancing rectal mass consistent with known rectal cancer. There are small perirectal and small pelvic sidewall lymph nodes which are suspicious. Prominent perirectal vessels are also noted. No evidence of metastatic retroperitoneal or sigmoid mesocolon adenopathy or hepatic or pulmonary metastasis.      10/24/2016 Imaging    MRI Pelvis IMPRESSION: 1. Moderately motion degraded exam. 2. Large mass is centered at  the anus with extension into the low rectum. No complicating obstruction. 3. Mild mesorectal adenopathy, suspicious. 4. Right inguinal hernia containing nonobstructive small bowel.       11/08/2016 - 12/19/2016 Chemotherapy    Xeloda 50 mg twice daily, with concurrent radiation, held since 12/03/2016 due to colitis and hospitalization      11/08/2016 - 12/19/2016 Radiation Therapy    Neoadjuvant radiation to his rectal cancer  1) Pelvis/ 45 Gy in 25 fractions  2) Rectum boost/ 9 Gy in 5 fractions Under the care of Dr. Lisbeth Renshaw.      12/03/2016 - 12/09/2016 Hospital Admission    He presented with few days to one week history of diarrhea with 6-7 loose stools daily, along with a fever of 101.5 on the day of admission. Patient was also noted to be hypokalemic, hyponatremic. Hospitalized for further management. Stool studies were sent. C. difficile was negative. GI pathogen panel was positive for Yersinia.  XELODA was held at this time, until infection has cleared.       12/07/2016 Imaging    CT A/P W Contrast  IMPRESSION: 1. Abnormal dilatation of the proximal small bowel loops worrisome for either small bowel obstruction versus small bowel ileus. 2. 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 3. Large right inguinal hernia containing edematous loop of distal small bowel 4. No pneumatosis or bowel perforation.  No abscess. 5. Aortic atherosclerosis 6. Mild decrease in size of rectal mass. No change in right common iliac adenopathy.      02/21/2017 Surgery  XI ROBOT ABDOMINOPERINEAL RESECTION WITH COLOSTOMY by Dr. Johney Maine      02/21/2017 Pathology Results    Diagnosis 02/21/17 1. Colon, segmental resection for tumor, rectosigmoid - INVASIVE ADENOCARCINOMA, MODERATELY DIFFERENTIATED, SPANNING 3.9 CM. - TUMOR INVADES THROUGH MUSCULARIS PROPRIA. - RESECTION MARGINS ARE NEGATIVE. - EIGHT OF  EIGHT LYMPH NODES NEGATIVE FOR CARCINOMA (0/8), SEE COMMENT. - SEE ONCOLOGY TABLE. 2. Soft tissue, biopsy, left lateral pelvic floor - BENIGN SKELETAL MUSCLE AND SOFT TISSUE. - NO MALIGNANCY IDENTIFIED. 3. Colon, segmental resection, proximal sigmoid - BENIGN COLONIC MUCOSA. - NO DYSPLASIA OR MALIGNANCY. 4. Liver, biopsy, liver mass - BENIGN VASCULAR PROLIFERATION CONSISTENT WITH HEMANGIOMA. - NO MALIGNANCY IDENTIFIED.      04/04/2017 - 08/12/2017 Chemotherapy    CAPOX every 3 weeks for 6 cycles. I decreased his first cycle of oxaliplatin platinum by 15%, he tolerated well, so we increased to full dose from cycle 2. He completed 5 cycles of oxaliplatin on 07/16/17 and completed Xeloda on 08/12/17.        08/15/2017 Procedure    Colonoscopy 08/15/17 IMPRESSION - Patent end colostomy with healthy appearing mucosa in the sigmoid colon. - The entire examined colon is normal. - No specimens collected.      09/18/2017 Imaging    CT CAP W Contrast 09/18/17 IMPRESSION: Interval abdomino-peritoneal resection with left lower quadrant colostomy. Increased diffuse cystitis, likely due to radiation. New presacral soft tissue density with central low attenuation, also suspicious for post treatment changes. Recommend continued attention on follow-up imaging. New mild retroperitoneal lymphadenopathy in the aortocaval space, measuring up to 1.8 cm. This is suspicious for metastatic disease, although it is conceivable that this could be reactive in etiology due to interval surgery and radiation therapy. Recommend short-term follow-up by CT in 3 months. New indeterminate 4 mm left upper lobe pulmonary nodule, likely inflammatory in etiology with metastatic disease considered less likely. Recommend continued attention on follow-up CT. Bilateral inguinal hernias, largest on the right containing several small bowel loops.      10/17/2017 PET scan    MPRESSION: Increased hypermetabolic  lymphadenopathy in abdominal retroperitoneum, right common iliac chain, and left supraclavicular region, consistent with metastatic disease. Stable tiny bilateral pulmonary nodules show no FDG uptake but are too small to characterize by PET. Recommend continued attention on follow-up CT. 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      10/29/2017 Relapse/Recurrence    Diagnosis Lymph node, needle/core biopsy, left supraclavicular - METASTATIC ADENOCARCINOMA, CONSISTENT WITH COLORECTAL PRIMARY. - SEE COMMENT.      11/20/2017 -  Chemotherapy    FOLFIRI every 2 weeks      12/30/2017 -  Chemotherapy    The patient had panitumumab (VECTIBIX) 400 mg in sodium chloride 0.9 % 100 mL chemo infusion, 6 mg/kg = 400 mg, Intravenous,  Once, 1 of 4 cycles Administration: 400 mg (01/03/2018)  for chemotherapy treatment.       01/08/2018 Imaging    CT CAP with contrast IMPRESSION: 1. Today's study demonstrates dramatic progression of disease with interval development of widespread pulmonary metastases, multiple new liver lesions and progressive lymphadenopathy noted in the pelvis, retroperitoneum and left supraclavicular nodal stations. In addition, the previously noted metastatic lesion in the right psoas muscle has markedly increased in size. Whether or not this reflects growth of the lesion and/or internal hemorrhage of the lesion is uncertain. 2. Stable postoperative thickening and small postoperative fluid collection in the presacral space, as above, following abdominal perineal resection. 3. Large right  inguinal hernia containing several loops of small bowel, without evidence of bowel incarceration or obstruction at this time.      HISTORY OF PRESENTING ILLNESS (10/25/16):  David Clarke 62 y.o. male is here because of his recently diagnosed rectal adenocarcinoma. He was referred by his gastroenterologist title parietal. Patient presents to my clinic,  accompanied by his wife and his daughter.       He has noticed a anal mass accompanied by intermittent bleeding and pain in the rectum for the past 5 months. The patient initially presented to the [facility] ED on 10/03/16 with rectal bleeding and difficulty with hygiene. He was without health insurance previously and so did not pursue evaluation or treatment until this time.  The patient was seen by Dr. Hilarie Fredrickson for a colonoscopy and biopsy on 10/09/16, revealing a large partially obstructing low rectal mass, prolapsing to anal verge. Biopsy showed adenocarcinoma. CT C/A/P on 10/13/16 revealed a large, irregular, enhancing mass consistent with known rectal cancer. There were several suspicious small perirectal and small pelvic sidewall lymph nodes. Prominent perirectal vessels were also noted. MRI of the pelvis on 10/24/16 showed a large mass centered at the anus with extension into the low rectum without obstruction. There was suspicious mild mesorectal adenopathy, and a right inguinal hernia containing nonobstructive small bowel.   The patient comes to the clinic for consultation today accompanied by his partner and daughter. He denies abdominal cramps, nausea, bowel or bladder concerns. He reports taking OTC stool softener as needed. He reports his appetite is good, but he has lost 12-15 lbs in the past few weeks. His daughter notes she has hemochromatosis and questions if this could be playing a role in the patient's anemia.  CURRENT THERAPY:  FOLFIRI every 2 weeks starting 11/20/17, Panitumumab added from cycle 3 on  01/03/2018   INTERIM HISTORY:  ELLIOT MELDRUM presents for follow up and discussion of recent imaging studies. He presents to the clinic today with his wife and his daughter. He has been doing well overall. He will be starting radiation treatments on Monday, 5/20.   Since his last visit to the clinic he underwent a CT CAP with contrast on 01/08/18 with results of: Today's study demonstrates  dramatic progression of disease with interval development of widespread pulmonary metastases, multiple new liver lesions and progressive lymphadenopathy noted in the pelvis, retroperitoneum and left supraclavicular nodal stations. In addition, the previously noted metastatic lesion in the right psoas muscle has markedly increased in size. Whether or not this reflects growth of the lesion and/or internal hemorrhage of the lesion is uncertain. Stable postoperative thickening and small postoperative fluid collection in the presacral space, as above, following abdominal perineal resection. Large right inguinal hernia containing several loops of small bowel, without evidence of bowel incarceration or obstruction at this time.  On review of systems, he reports waxing and waning right leg pain, constipation (treating with OTC miralax), weight loss due to constipation, acne around the mouth, extreme fatigue. He is taking ibuprofen during the day and 2 tramadol during the night to aid in controlling his pain. He voices if he can use THC or CBD oil to aid with his pain control. He typically has pain levels that are worsened with movement. 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 He notes that he has white spots on his tongue that he has noticed on the last cycle and it resolve on its own. He is consuming protein shakes  and he has protein powders that he will be consuming as well. he denies nausea and any other symptoms. Pertinent positives are listed and detailed within the above HPI.     MEDICAL HISTORY:  Past Medical History:  Diagnosis Date  . Allergy   . Cancer ([place]) 10/09/2016   rectal adenocarcinoma  . Inguinal hernia     SURGICAL HISTORY: Past Surgical History:  Procedure Laterality Date  . COLONOSCOPY W/ BIOPSIES Bilateral 10/09/2016   rectal adenocarcinoma  . IR FLUORO GUIDE PORT INSERTION RIGHT  11/13/2017  . IR US GUIDE VASC ACCESS RIGHT  11/13/2017  . MOUTH SURGERY       age 23, to remove extra "eye" teeth  . [place] teeth extraction    . XI ROBOT ABDOMINAL PERINEAL RESECTION N/A 02/21/2017   Procedure: XI ROBOT ABDOMINOPERINEAL RESECTION WITH COLOSTOMY;  Surgeon: Michael Boston, MD;  Location: [facility];  Service: General;  Laterality: N/A;    SOCIAL HISTORY: Social History   Socioeconomic History  . Marital status: Soil scientist    Spouse name: Not on file  . Number of children: 1  . Years of education: Not on file  . Highest education level: Not on file  Occupational History  . Occupation: Architectural technologist  Social Needs  . Financial resource strain: Not on file  . Food insecurity:    Worry: Not on file    Inability: Not on file  . Transportation needs:    Medical: Not on file    Non-medical: Not on file  Tobacco Use  . Smoking status: Former Smoker    Years: 2.00    Types: Cigarettes    Last attempt to quit: 2002    Years since quitting: 17.3  . Smokeless tobacco: Never Used  Substance and Sexual Activity  . Alcohol use: Not Currently    Alcohol/week: 6.0 oz    Types: 4 Glasses of wine, 6 Cans of beer per week    Comment: beer or a glass or wine most days of the week  . Drug use: No  . Sexual activity: Yes    Partners: Female  Lifestyle  . Physical activity:    Days per week: Not on file    Minutes per session: Not on file  . Stress: Not on file  Relationships  . Social connections:    Talks on phone: Not on file    Gets together: Not on file    Attends religious service: Not on file    Active member of club or organization: Not on file    Attends meetings of clubs or organizations: Not on file    Relationship status: Not on file  . Intimate partner violence:    Fear of current or ex partner: Not on file    Emotionally abused: Not on file    Physically abused: Not on file    Forced sexual activity: Not on file  Other Topics Concern  . Not on file  Social History Narrative  . Not on file    FAMILY HISTORY: Family  History  Problem Relation Age of Onset  . Other Mother        bile duct tumor  . Stroke Mother   . Cancer Mother 24       bile duct cancer   . Prostate cancer Father 33  . Emphysema Father   . Colon cancer Neg Hx   . Esophageal cancer Neg Hx   . Pancreatic cancer Neg Hx   . Rectal cancer  Neg Hx   . Stomach cancer Neg Hx     ALLERGIES:  is allergic to sulfa antibiotics.  MEDICATIONS:  Current Outpatient Medications  Medication Sig Dispense Refill  . acetaminophen (TYLENOL) 500 MG tablet Take 500 mg by mouth every 6 (six) hours as needed.    . clindamycin (CLINDAGEL) 1 % gel Apply topically 2 (two) times daily. 60 g 1  . Cyanocobalamin (VITAMIN B 12 PO) Take 1 tablet by mouth daily.    Marland Kitchen dexamethasone (DECADRON) 4 MG tablet Take 2 tablets (8 mg total) by mouth daily. Start the day after chemo for 2 days. 8 tablet 5  . gabapentin (NEURONTIN) 100 MG capsule Take 1 capsule (100 mg total) by mouth at bedtime. 30 capsule 0  . lidocaine-prilocaine (EMLA) cream Apply 1 application topically as needed. 30 g 2  . lidocaine-prilocaine (EMLA) cream Apply to affected area once 30 g 3  . loperamide (IMODIUM A-D) 2 MG tablet Take 1 tablet (2 mg total) by mouth 4 (four) times daily as needed. Take 2 at diarrhea onset , then 1 every 2hr until 12hrs with no BM. May take 2 every 4hrs at night. If diarrhea recurs repeat. 100 tablet 1  . LORazepam (ATIVAN) 1 MG tablet Take 1 tablet (1 mg total) by mouth every 6 (six) hours as needed (NAUSEA). 30 tablet 0  . Multiple Vitamin (MULTIVITAMIN WITH MINERALS) TABS tablet Take 1 tablet by mouth every other day.     . ondansetron (ZOFRAN) 8 MG tablet Take 1 tablet (8 mg total) by mouth 2 (two) times daily as needed for refractory nausea / vomiting. Start on day 3 after chemotherapy. 30 tablet 1  . prochlorperazine (COMPAZINE) 10 MG tablet Take 1 tablet (10 mg total) by mouth every 6 (six) hours as needed (NAUSEA). 30 tablet 1  . traMADol (ULTRAM) 50 MG tablet Take  1-2 tablets (50-100 mg total) by mouth every 8 (eight) hours as needed. 60 tablet 0  . nystatin (MYCOSTATIN) 100000 UNIT/ML suspension Take 5 mLs (500,000 Units total) by mouth 4 (four) times daily. 473 mL 0   Current Facility-Administered Medications  Medication Dose Route Frequency Provider Last Rate Last Dose  . 0.9 %  sodium chloride infusion  500 mL Intravenous Continuous Pyrtle, Lajuan Lines, MD      . 0.9 %  sodium chloride infusion  500 mL Intravenous Once Pyrtle, Lajuan Lines, MD        REVIEW OF SYSTEMS:  Constitutional: Denies fevers, chills or abnormal night sweats (+) slight weight loss  Eyes: Denies blurriness of vision, double vision or watery eyes Ears, nose, mouth, throat, and face: Denies mucositis or sore throat  Respiratory: Denies cough, dyspnea or wheezes Cardiovascular: Denies palpitation, chest discomfort or  Gastrointestinal:  Denies nausea, heartburn or change in bowel habits (+) colostomy bag  Skin: Negative Lymphatics: Denies new lymphadenopathy or easy bruising MSK:  (+) right glute pain and rigth mid-thigh numbness, with limp Neurological: (+) improved neuropathy, mild   Behavioral/Psych: Mood is stable, no new changes  All other systems were reviewed with the patient and are negative.   PHYSICAL EXAMINATION:   ECOG PERFORMANCE STATUS: 2  Vitals:   01/10/18 0832  BP: 100/70  Pulse: 100  Resp: 17  Temp: 97.7 F (36.5 C)  SpO2: 100%   Filed Weights   01/10/18 0832  Weight: 131 lb 14.4 oz (59.8 kg)     GENERAL: alert, no distress and comfortable.  SKIN: skin color, texture, turgor are normal,  no rashes or significant lesions NECK: supple, thyroid normal size, non-tender, without nodularity LYMPH: (+) 2 cm left supraclavicular lymph node is now barely palpable, non-tender LUNGS: clear to auscultation and percussion with normal breathing effort HEART: regular rate & rhythm and no murmurs and no lower extremity edema ABDOMEN: grapefruit size mass palpated in  the RLQ. abdomen soft, non-tender and normal bowel sounds (+) laparoscopic incisions all healed Musculoskeletal: no cyanosis of digits and no clubbing  PSYCH: alert & oriented x 3 with fluent speech NEURO: no focal motor/sensory deficits.   LABORATORY DATA:  I have reviewed the data as listed CBC Latest Ref Rng & Units 01/03/2018 12/25/2017 12/06/2017  WBC 4.0 - 10.3 K/uL 10.5(H) 2.0(L) 3.1(L)  Hemoglobin 13.0 - 17.1 g/dL 11.5(L) 12.2(L) 12.1(L)  Hematocrit 38.4 - 49.9 % 34.1(L) 35.6(L) 35.8(L)  Platelets 140 - 400 K/uL 376 351 232   CMP Latest Ref Rng & Units 01/03/2018 12/25/2017 12/06/2017  Glucose 70 - 140 mg/dL 106 98 130  BUN 7 - 26 mg/dL 15 16 13   Creatinine 0.70 - 1.30 mg/dL 0.86 0.80 0.79  Sodium 136 - 145 mmol/L 133(L) 134(L) 136  Potassium 3.5 - 5.1 mmol/L 4.2 3.8 3.6  Chloride 98 - 109 mmol/L 98 101 103  CO2 22 - 29 mmol/L 25 24 24   Calcium 8.4 - 10.4 mg/dL 10.3 9.9 9.2  Total Protein 6.4 - 8.3 g/dL 8.3 7.6 7.1  Total Bilirubin 0.2 - 1.2 mg/dL 0.7 0.8 0.6  Alkaline Phos 40 - 150 U/L 156(H) 120 110  AST 5 - 34 U/L 23 21 24   ALT 0 - 55 U/L 17 15 18      CEA ([facility])  10/27/2016: 7.81 11/27/2016: 5.56 01/10/2017: 5.61 03/14/17: 5.11 04/11/17: 5.87 05/23/17: 8.79 06/19/17:  9.15 07/16/17:  9.88 08/10/17: 10.40 09/18/17: 8.31 10/30/17: 8.11 11/20/17: 7.96 12/25/2017: 13.95   PATHOLOGY:  10/29/17 Diagnosis Lymph node, needle/core biopsy, left supraclavicular - METASTATIC ADENOCARCINOMA, CONSISTENT WITH COLORECTAL PRIMARY. - SEE COMMENT. Microscopic Comment The malignant cells are positive for CDX-2 and cytokeratin 20. They are negative for cytokeratin 7, p63 and cytokeratin 5/6. The findings are consistent with primary colorectal adenocarcinoma. Dr. Thressa Sheller has reviewed the case and concurs with this interpretation. Dr. Burr Medico was paged on 10/30/17. Additional studies can be performed upon clinician request. (JBK:gt, 10/31/17)    Diagnosis 02/21/17 1. Colon, segmental  resection for tumor, rectosigmoid - INVASIVE ADENOCARCINOMA, MODERATELY DIFFERENTIATED, SPANNING 3.9 CM. - TUMOR INVADES THROUGH MUSCULARIS PROPRIA. - RESECTION MARGINS ARE NEGATIVE. - EIGHT OF EIGHT LYMPH NODES NEGATIVE FOR CARCINOMA (0/8), SEE COMMENT. - SEE ONCOLOGY TABLE. 2. Soft tissue, biopsy, left lateral pelvic floor - BENIGN SKELETAL MUSCLE AND SOFT TISSUE. - NO MALIGNANCY IDENTIFIED. 3. Colon, segmental resection, proximal sigmoid - BENIGN COLONIC MUCOSA. - NO DYSPLASIA OR MALIGNANCY. 4. Liver, biopsy, liver mass - BENIGN VASCULAR PROLIFERATION CONSISTENT WITH HEMANGIOMA. - NO MALIGNANCY IDENTIFIED. Microscopic Comment 1. COLON AND RECTUM (INCLUDING TRANS-ANAL RESECTION): Specimen: Rectosigmoid colon with anus. Procedure: Abdominoperineal resection. Tumor site: Distal 1/3 rectum to anus. Specimen integrity: Intact. Macroscopic intactness of mesorectum: Incomplete. Macroscopic tumor perforation: Not identified. Invasive tumor: Maximum size: 3.9 cm Histologic type(s): Invasive adenocarcinoma. Histologic grade and differentiation: G2: moderately differentiated/low grade Type of polyp in which invasive carcinoma arose: N/A. Microscopic extension of invasive tumor: Tumor invades through muscularis propria. Lymph-Vascular invasion: Not identified. 1 of 3 FINAL for RUDRANSH, BELLANCA [phone]) Microscopic Comment(continued) Peri-neural invasion: Present. Tumor deposit(s) (discontinuous extramural extension): Not identified. Resection margins: Proximal margin: Negative. Distal margin:  Negative. Circumferential (radial) (posterior ascending, posterior descending; lateral and posterior mid-rectum; and entire lower 1/3 rectum): Negative. Mesenteric margin (sigmoid and transverse): Negative. Distance closest margin (if all above margins negative): <0.1 cm radial margin. >1 cm distal margin. Treatment effect (neo-adjuvant therapy): Present Additional polyp(s): N/A. Non-neoplastic  findings: None. Lymph nodes: number examined 8; number positive: 0 (treatment effect present). Pathologic Staging: ypT3, ypN0, ypMX Ancillary studies: Can be performed upon request. Comment: The fat was cleared and additional lymph nodes were not identified.  Diagnosis 10/09/2016 Rectum, biopsy, mass - ADENOCARCINOMA.  RADIOGRAPHIC STUDIES: I have personally reviewed the radiological images as listed and agreed with the findings in the report.  CT CAP with contrast, 01/08/2018 IMPRESSION: 1. Today's study demonstrates dramatic progression of disease with interval development of widespread pulmonary metastases, multiple new liver lesions and progressive lymphadenopathy noted in the pelvis, retroperitoneum and left supraclavicular nodal stations. In addition, the previously noted metastatic lesion in the right psoas muscle has markedly increased in size. Whether or not this reflects growth of the lesion and/or internal hemorrhage of the lesion is uncertain. 2. Stable postoperative thickening and small postoperative fluid collection in the presacral space, as above, following abdominal perineal resection. 3. Large right inguinal hernia containing several loops of small bowel, without evidence of bowel incarceration or obstruction at this time.  PET Scan 10/17/17 IMPRESSION: Increased hypermetabolic lymphadenopathy in abdominal retroperitoneum, right common iliac chain, and left supraclavicular region, consistent with metastatic disease. Stable tiny bilateral pulmonary nodules show no FDG uptake but are too small to characterize by PET. Recommend continued attention on follow-up CT. 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   CT CAP W Contrast 09/18/17 IMPRESSION: Interval abdomino-peritoneal resection with left lower quadrant colostomy. Increased diffuse cystitis, likely due to radiation. New presacral soft tissue density with central low  attenuation, also suspicious for post treatment changes. Recommend continued attention on follow-up imaging. New mild retroperitoneal lymphadenopathy in the aortocaval space, measuring up to 1.8 cm. This is suspicious for metastatic disease, although it is conceivable that this could be reactive in etiology due to interval surgery and radiation therapy. Recommend short-term follow-up by CT in 3 months. New indeterminate 4 mm left upper lobe pulmonary nodule, likely inflammatory in etiology with metastatic disease considered less likely. Recommend continued attention on follow-up CT. Bilateral inguinal hernias, largest on the right containing several small bowel loops.   Colonoscopy 08/15/17 IMPRESSION - Patent end colostomy with healthy appearing mucosa in the sigmoid colon. - The entire examined colon is normal. - No specimens collected.  MRI Pelvis W WO Contrast 01/29/17 IMPRESSION: Significant decrease in size of soft tissue mass at the anorectal junction, with post radiation changes. No residual perirectal lymphadenopathy or other signs of pelvic metastatic disease. Stable small right inguinal hernia containing small bowel.  CT A/P W Contrast 12/07/16 IMPRESSION: 1. Abnormal dilatation of the proximal small bowel loops worrisome for either small bowel obstruction versus small bowel ileus. 2. 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 3. Large right inguinal hernia containing edematous loop of distal small bowel 4. No pneumatosis or bowel perforation.  No abscess. 5. Aortic atherosclerosis 6. Mild decrease in size of rectal mass. No change in right common iliac adenopathy.  DG Chest 2 View 12/03/2016 IMPRESSION: 1. No active cardiopulmonary disease. No evidence of pneumonia or pulmonary edema. No evidence of intrathoracic metastasis. 2. Hyperexpanded lungs suggesting  COPD.  MRI  Pelvis 10/24/2016 IMPRESSION: 1. Moderately motion degraded exam. 2. Large mass is centered at the anus with extension into the low rectum. No complicating obstruction. 3. Mild mesorectal adenopathy, suspicious. 4. Right inguinal hernia containing nonobstructive small bowel.  CT CAP w Contrast 10/13/2016 IMPRESSION: Large irregular enhancing rectal mass consistent with known rectal cancer. There are small perirectal and small pelvic sidewall lymph nodes which are suspicious. Prominent perirectal vessels are also noted. No evidence of metastatic retroperitoneal or sigmoid mesocolon adenopathy or hepatic or pulmonary metastasis.  COLONOSCOPY 10/09/2016 Dr. Hilarie Fredrickson  A fungating, infiltrative and ulcerated partially obstructing large mass was found in the distal rectum. The mass was circumferential. The mass measured seven cm in length (prolapsing and from dentate line to approx 7 cm). In addition, its inner diameter measured nine mm. Oozing was present. This was biopsied with a cold forceps for histology. - Due to the distal rectal mass the bowel preparation was poor and unable to be cleared via the adult upper endoscope (adult upper endoscope used due to obstructing mass). Scope not advanced proximal to the rectosigmoid colon.  ASSESSMENT & PLAN: 62 y.o.  gentleman, previously healthy, presented with an anal mass and a mild intermittent rectal bleeding for 5-6 months. Fatigue, anorexia, fatigue and weight loss for a month. He is currently on radiation for his rectal cancer, chemotherapy has been held lately.  1. Rectal Adenocarcinoma, low rectum, [id number], ypT3N0, stage IIIB, MSI-stable, nodes metastasis 09/2017, KRAS/NRAS/BRAF wild type  -I previously reviewed his CT and MRI scan, colonoscopy and biopsy findings in detail with pt and his family members -Based on his imaging findings, he had clinical stage III disease. We reviewed his image in our GI tumor Board, he also has a  small liver lesion which is indeterminate, likely benign, and we will follow up with a repeated CT in about 3 months.  -patient received neoadjuvant chemotherapy and radiation with Xeloda, which was held for the last few weeks due to infectious colitis. -He underwent APR on 02/21/2017, surgical margins were negative, all lymph nodes were negative. We've reviewed his surgical pathology findings he did have significant treatment effect on his surgical sample.  -He recovered well from surgery, started adjuvant CAPOX 04/04/17. He completed 5 cycles of oxaliplatin on 07/16/17 and completed Xeloda on 08/12/17. His oxaliplatin treatment was postponed a few times due to his working schedule -He had repeated colonoscopy by Dr. Hilarie Fredrickson in 07/2017 which was negative  -We discussed his CT CAP from 09/18/17 which shows multiple slightly enlarged RP lymph nodes and a 4 mm lung nodule in the left lower lobe. PET Scan from 10/17/17 reveals increased hypermetabolic lymphadenopathy in abdominal retroperitoneum, right common iliac chain, and left supraclavicular region, consistent with metastatic disease and stable tiny bilateral pulmonary nodules show no FDG uptake but are too small to characterize by PET. I discussed results with pt and his family. I highly suspect that this is metastatic disease. He will get a biopsy to confirm this.  -He underwent left supraclavicular lymph node biopsy on 10/29/17.  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 Given his rapid Recurrence (2 months) after completion of adjuvant Capox, I recommend FOLFIRI as his next line therapy. -He went to [facility] for 2nd opinion with Dr. Aleatha Borer on 3/18 and understands he is not a candidate for clinical trial [id number] at [facility] and chose to proceed with current chemotherapy plan.  -He started FOLFIRI on 11/20/17 and has  tolerated first cycle well.  -His FO genomic testing  showed MSI stable disease, K-ras, NRAS, BRAF wild-type, no other targetable mutations.  Panitumumab was added on from cycle 3.  -Due to his worsening right flank pain, he was referred to radiation Dr. Lisbeth Renshaw for palliative radiation. -I reviewed his recent restaging CT scan findings with patient and his family, unfortunately he has dramatic progression of the right metastatic lesion in the right psoas muscle which is 13.9 cm now, and he also developed new pulmonary, liver metastasis.  We discussed this could be partially related to his delay of treatment, and he has had only 3 cycle chemo, 1 cycle of panitumumab.  Unfortunately the biology of his tumor is extremely aggressive, I recommend him to consider more intense chemotherapy FOLFIRINOX and continue panitumumab -He is scheduled to start radiation next Monday.  I have discussed with Dr. Lisbeth Renshaw, and he agrees to shorten his radiation course to 8 treatments, to avoid further delay of his chemo treatment. -On next week, he will only receive 5FU and panitumumab only. Plan to start FOLFIRINOX in 3 weeks  -We will obtain restaging scansin 2 months for close follow-up -I discussed with the patient and his family again to follow up with Dr. Paticia Stack at [facility] regarding clinical trials option that the patient may qualify for.  -I will follow up with [facility] to determine if there are clinical trials that the patient may qualify for. I also discussed Phase 1 [place] site to note if the patient will qualify for clinical trials.  -F/u in 1 week with me   2. 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 -Hemoglobin was 8.3 initially with low MCV, likely anemia of iron deficiency from chronic blood loss. This was confirmed by his iron study. -Anemia improved with IV iron   -His daughter was diagnosed with hemochromatosis (homozygous), his genetic testing showed he is a carrier -He received IV iron once, I'll be cautious about IV iron replacement due to his hemachromatosis carrier status. -he took some oral iron also -will monitor his CBC and iron level closely. -Hg at 12.1 today (12/06/17)  3. Peripheral neuropathy, secondary to chemo, G1 -mild numbness, no decreased sensation on exam, hand function is normal  -He has slightly more noticeable neuropathy, but hand functions are normal and adequate for treatment -continue monitoring closely  -He takes B complex and his neuropathy has improved -Mild neuropathy -He has been taking 1 tablet of 100 mg gabapentin at night.  -I advised that the patient can increase his dose to 2 tablets 100 mg at night and if he is not sleepy in the morning, then he can take 1 tablet in the morning.  -I informed the patient to titrate his night dose to 300 mg nightly within the next 1.5 weeks.   4.Goal of care discussion  -We again discussed the incurable nature of his cancer, and the overall poor prognosis due to the metastatic disease, especially if he does not have good response to chemotherapy or progress on chemo -The patient understands the goal of care is palliative, to prolong his life. -He is full code for now   5. Right sacral and anterolateral upper leg pain with associated numbness to mid-thigh  -Likely secondary to right iliac adenopathy and psoas involvement. Has been taking tramadol 2 tabs  BID and supplementing with tylenol as well as integrating non-pharm measures.  -Pain is well managed but interfering with sleep. His work requires him to be on his feet for prolonged periods. He does not like "fog" and constipation effects of tramadol, therefore not interested in escalating opioids at this time.  -Patient is only on Neurontin 100 mg at night, I recommended him to increase dose gradually to 324m HS, and add 100-2026m in the morning, dose can eventually increased to 300 twice 3 times daily  6. Constipation  -He uses stool softener and miralax PRN. I reviewed potential for diarrhea associated with irinotecan.  -He is holding laxative for now. He understands indications for using laxative vs anti-diarrheal if he develops change in his bowel habits.  -I previously encouraged him to increase water intake if he develops diarrhea and call clinic if not controlled by imodium -Constipation resolved currently, likely due to chemotherapy.  -He is taking senakot and OTC miralax to aid with this. I advised the patient to increase -I discussed with the patient and his family regarding magnesium citrate as a back up if he hasn't had relief with senakot or miralax.   7. Mouth Pain and oral thrush  -The patient doesn't have any mouth lesions, however he does have a mild white coating to his tongue -I will prescribe nystatin to aid with this.    Plan  -Start radiation treatment on Monday, 5/20, Dr. MoLisbeth Renshawill shorten his course to 8 treatments  -Follow up as scheduled, he will received panitumumab and 5-fu next week  -Nystatin prescription was called in -I recommend him to increase his neurontin dose  -plan to switch chemo to FOLFIRINOX in 3 weeks  -[facility]    All questions were answered. The patient knows to call the clinic with any problems, questions or concerns.  I spent 25 minutes counseling the patient face to face. The total time spent in the appointment was 40 minutes and more than 50% was on counseling.   YaTruitt MerleMD 01/10/2018   This document serves as a record of services personally performed by YaTruitt MerleMD. It was created on her behalf by SoSteva Coldera trained medical scribe. The creation of this record is based on the scribe's personal observations and the provider's statements to them.   I have reviewed the  above documentation for accuracy and completeness, and I agree with the above.

## 2018-01-09 NOTE — Telephone Encounter (Signed)
Appointment scheduled and patient has been notified per 5/15 sch msg

## 2018-01-09 NOTE — Telephone Encounter (Signed)
Left message for patient regarding upcoming may appointments per 5/15 sch message.

## 2018-01-10 ENCOUNTER — Inpatient Hospital Stay (HOSPITAL_BASED_OUTPATIENT_CLINIC_OR_DEPARTMENT_OTHER): Payer: BLUE CROSS/BLUE SHIELD | Admitting: Hematology

## 2018-01-10 ENCOUNTER — Encounter: Payer: Self-pay | Admitting: Hematology

## 2018-01-10 VITALS — BP 100/70 | HR 100 | Temp 97.7°F | Resp 17 | Ht 72.0 in | Wt 131.9 lb

## 2018-01-10 DIAGNOSIS — L709 Acne, unspecified: Secondary | ICD-10-CM

## 2018-01-10 DIAGNOSIS — R5383 Other fatigue: Secondary | ICD-10-CM

## 2018-01-10 DIAGNOSIS — D500 Iron deficiency anemia secondary to blood loss (chronic): Secondary | ICD-10-CM | POA: Diagnosis not present

## 2018-01-10 DIAGNOSIS — R209 Unspecified disturbances of skin sensation: Secondary | ICD-10-CM | POA: Diagnosis not present

## 2018-01-10 DIAGNOSIS — M533 Sacrococcygeal disorders, not elsewhere classified: Secondary | ICD-10-CM

## 2018-01-10 DIAGNOSIS — C770 Secondary and unspecified malignant neoplasm of lymph nodes of head, face and neck: Secondary | ICD-10-CM

## 2018-01-10 DIAGNOSIS — M79604 Pain in right leg: Secondary | ICD-10-CM | POA: Diagnosis not present

## 2018-01-10 DIAGNOSIS — G620 Drug-induced polyneuropathy: Secondary | ICD-10-CM

## 2018-01-10 DIAGNOSIS — C787 Secondary malignant neoplasm of liver and intrahepatic bile duct: Secondary | ICD-10-CM | POA: Diagnosis not present

## 2018-01-10 DIAGNOSIS — B370 Candidal stomatitis: Secondary | ICD-10-CM

## 2018-01-10 DIAGNOSIS — K5900 Constipation, unspecified: Secondary | ICD-10-CM

## 2018-01-10 DIAGNOSIS — C7800 Secondary malignant neoplasm of unspecified lung: Secondary | ICD-10-CM

## 2018-01-10 DIAGNOSIS — C7989 Secondary malignant neoplasm of other specified sites: Secondary | ICD-10-CM | POA: Diagnosis not present

## 2018-01-10 DIAGNOSIS — Z87891 Personal history of nicotine dependence: Secondary | ICD-10-CM

## 2018-01-10 DIAGNOSIS — C20 Malignant neoplasm of rectum: Secondary | ICD-10-CM

## 2018-01-10 DIAGNOSIS — T451X5A Adverse effect of antineoplastic and immunosuppressive drugs, initial encounter: Secondary | ICD-10-CM

## 2018-01-10 DIAGNOSIS — Z148 Genetic carrier of other disease: Secondary | ICD-10-CM

## 2018-01-10 MED ORDER — NYSTATIN 100000 UNIT/ML MT SUSP: 5.0000 mL | Freq: Four times a day (QID) | OROMUCOSAL | 0 refills | Status: DC

## 2018-01-11 ENCOUNTER — Telehealth: Payer: Self-pay | Admitting: Hematology

## 2018-01-11 NOTE — Telephone Encounter (Signed)
No LOS 5/16

## 2018-01-14 ENCOUNTER — Telehealth: Payer: Self-pay | Admitting: Radiation Oncology

## 2018-01-14 ENCOUNTER — Telehealth: Payer: Self-pay | Admitting: Hematology

## 2018-01-14 ENCOUNTER — Ambulatory Visit
Admission: RE | Admit: 2018-01-14 | Discharge: 2018-01-14 | Disposition: A | Discharge status: Home / Self Care | Payer: BLUE CROSS/BLUE SHIELD | Source: Ambulatory Visit | Attending: Radiation Oncology | Admitting: Radiation Oncology

## 2018-01-14 DIAGNOSIS — C20 Malignant neoplasm of rectum: Secondary | ICD-10-CM | POA: Diagnosis not present

## 2018-01-14 NOTE — Telephone Encounter (Signed)
No los 5/16 °

## 2018-01-14 NOTE — Telephone Encounter (Signed)
I spoke with the patient to let him know that treatment is on standby today until his insurance coverage is completed. There was a change in the number of fractions for treatment due to trying to get the patient to systemic therapy more quickly. He has been previously treated and this is a similar site.

## 2018-01-15 ENCOUNTER — Ambulatory Visit
Admission: RE | Admit: 2018-01-15 | Discharge: 2018-01-15 | Disposition: A | Discharge status: Home / Self Care | Payer: BLUE CROSS/BLUE SHIELD | Source: Ambulatory Visit | Attending: Radiation Oncology | Admitting: Radiation Oncology

## 2018-01-15 DIAGNOSIS — C20 Malignant neoplasm of rectum: Secondary | ICD-10-CM | POA: Diagnosis not present

## 2018-01-16 ENCOUNTER — Ambulatory Visit
Admission: RE | Admit: 2018-01-16 | Discharge: 2018-01-16 | Disposition: A | Discharge status: Home / Self Care | Payer: BLUE CROSS/BLUE SHIELD | Source: Ambulatory Visit | Attending: Radiation Oncology | Admitting: Radiation Oncology

## 2018-01-16 DIAGNOSIS — C20 Malignant neoplasm of rectum: Secondary | ICD-10-CM | POA: Diagnosis not present

## 2018-01-16 NOTE — Progress Notes (Signed)
[name]  [phone] [phone]  Clinic Follow Up Note   Patient Care Team: Robyn Haber, MD as PCP - General (Family Medicine) Michael Boston, MD as Consulting Physician (General Surgery) Pyrtle, Lajuan Lines, MD as Consulting Physician (Gastroenterology) Truitt Merle, MD as Consulting Physician (Hematology and Oncology) Kyung Rudd, MD as Consulting Physician (Radiation Oncology)   Date of Service:  01/17/2018  CHIEF COMPLAINTS:  Follow up rectal adenocarcinoma  Oncology History   Cancer Staging Rectal adenocarcinoma [facility]) Staging form: Colon and Rectum, AJCC 8th Edition - Clinical stage from 10/09/2016: Stage IIIB (cT3, cN1b, cM0) - Signed by Truitt Merle, MD on 10/25/2016 - Pathologic stage from 02/21/2017: Stage IIA (ypT3, pN0, cM0) - Signed by Truitt Merle, MD on 10/18/2017       Rectal adenocarcinoma ([name])   10/03/2016 - 10/03/2016 Hospital Admission    Admit date: 10/03/16 The patient presented to the [facility] ED with rectal bleeding and difficulty with hygiene.      10/09/2016 Initial Diagnosis    Rectal adenocarcinoma       10/09/2016 Procedure    Colonoscopy showed a fungating, infiltrative and ulcerated partially obstructing large mass in the distal rectum, measuring 9 cm, prolapsing at anal canal. Scope not advanced proximal to the rectosigmoid colon.      10/09/2016 Initial Biopsy    Rectal mass biopsy showed invasive adenocarcinoma.      10/13/2016 Imaging    CT Chest Abdomen Pelvis w contrast IMPRESSION: Large irregular enhancing rectal mass consistent with known rectal cancer. There are small perirectal and small pelvic sidewall lymph nodes which are suspicious. Prominent perirectal vessels are also noted. No evidence of metastatic retroperitoneal or sigmoid mesocolon adenopathy or hepatic or pulmonary metastasis.      10/24/2016 Imaging    MRI Pelvis IMPRESSION: 1. Moderately motion degraded exam. 2. Large mass is centered at  the anus with extension into the low rectum. No complicating obstruction. 3. Mild mesorectal adenopathy, suspicious. 4. Right inguinal hernia containing nonobstructive small bowel.       11/08/2016 - 12/19/2016 Chemotherapy    Xeloda 50 mg twice daily, with concurrent radiation, held since 12/03/2016 due to colitis and hospitalization      11/08/2016 - 12/19/2016 Radiation Therapy    Neoadjuvant radiation to his rectal cancer  1) Pelvis/ 45 Gy in 25 fractions  2) Rectum boost/ 9 Gy in 5 fractions Under the care of Dr. Lisbeth Renshaw.      12/03/2016 - 12/09/2016 Hospital Admission    He presented with few days to one week history of diarrhea with 6-7 loose stools daily, along with a fever of 101.5 on the day of admission. Patient was also noted to be hypokalemic, hyponatremic. Hospitalized for further management. Stool studies were sent. C. difficile was negative. GI pathogen panel was positive for Yersinia.  XELODA was held at this time, until infection has cleared.       12/07/2016 Imaging    CT A/P W Contrast  IMPRESSION: 1. Abnormal dilatation of the proximal small bowel loops worrisome for either small bowel obstruction versus small bowel ileus. 2. 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 3. Large right inguinal hernia containing edematous loop of distal small bowel 4. No pneumatosis or bowel perforation.  No abscess. 5. Aortic atherosclerosis 6. Mild decrease in size of rectal mass. No change in right common iliac adenopathy.      02/21/2017 Surgery  XI ROBOT ABDOMINOPERINEAL RESECTION WITH COLOSTOMY by Dr. Johney Maine      02/21/2017 Pathology Results    Diagnosis 02/21/17 1. Colon, segmental resection for tumor, rectosigmoid - INVASIVE ADENOCARCINOMA, MODERATELY DIFFERENTIATED, SPANNING 3.9 CM. - TUMOR INVADES THROUGH MUSCULARIS PROPRIA. - RESECTION MARGINS ARE NEGATIVE. - EIGHT OF  EIGHT LYMPH NODES NEGATIVE FOR CARCINOMA (0/8), SEE COMMENT. - SEE ONCOLOGY TABLE. 2. Soft tissue, biopsy, left lateral pelvic floor - BENIGN SKELETAL MUSCLE AND SOFT TISSUE. - NO MALIGNANCY IDENTIFIED. 3. Colon, segmental resection, proximal sigmoid - BENIGN COLONIC MUCOSA. - NO DYSPLASIA OR MALIGNANCY. 4. Liver, biopsy, liver mass - BENIGN VASCULAR PROLIFERATION CONSISTENT WITH HEMANGIOMA. - NO MALIGNANCY IDENTIFIED.      04/04/2017 - 08/12/2017 Chemotherapy    CAPOX every 3 weeks for 6 cycles. I decreased his first cycle of oxaliplatin platinum by 15%, he tolerated well, so we increased to full dose from cycle 2. He completed 5 cycles of oxaliplatin on 07/16/17 and completed Xeloda on 08/12/17.        08/15/2017 Procedure    Colonoscopy 08/15/17 IMPRESSION - Patent end colostomy with healthy appearing mucosa in the sigmoid colon. - The entire examined colon is normal. - No specimens collected.      09/18/2017 Imaging    CT CAP W Contrast 09/18/17 IMPRESSION: Interval abdomino-peritoneal resection with left lower quadrant colostomy. Increased diffuse cystitis, likely due to radiation. New presacral soft tissue density with central low attenuation, also suspicious for post treatment changes. Recommend continued attention on follow-up imaging. New mild retroperitoneal lymphadenopathy in the aortocaval space, measuring up to 1.8 cm. This is suspicious for metastatic disease, although it is conceivable that this could be reactive in etiology due to interval surgery and radiation therapy. Recommend short-term follow-up by CT in 3 months. New indeterminate 4 mm left upper lobe pulmonary nodule, likely inflammatory in etiology with metastatic disease considered less likely. Recommend continued attention on follow-up CT. Bilateral inguinal hernias, largest on the right containing several small bowel loops.      10/17/2017 PET scan    MPRESSION: Increased hypermetabolic  lymphadenopathy in abdominal retroperitoneum, right common iliac chain, and left supraclavicular region, consistent with metastatic disease. Stable tiny bilateral pulmonary nodules show no FDG uptake but are too small to characterize by PET. Recommend continued attention on follow-up CT. 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      10/29/2017 Relapse/Recurrence    Diagnosis Lymph node, needle/core biopsy, left supraclavicular - METASTATIC ADENOCARCINOMA, CONSISTENT WITH COLORECTAL PRIMARY. - SEE COMMENT.      11/20/2017 -  Chemotherapy    FOLFIRI every 2 weeks, panitumumab was added from cycle 3.  FOLFIRI was held after cycle 3 due to disease progression, she continued with 5-FU and Panitumumab   PENDING start of FOLFIRINOX on 01/31/18       01/08/2018 Imaging    CT CAP with contrast IMPRESSION: 1. Today's study demonstrates dramatic progression of disease with interval development of widespread pulmonary metastases, multiple new liver lesions and progressive lymphadenopathy noted in the pelvis, retroperitoneum and left supraclavicular nodal stations. In addition, the previously noted metastatic lesion in the right psoas muscle has markedly increased in size. Whether or not this reflects growth of the lesion and/or internal hemorrhage of the lesion is uncertain. 2. Stable postoperative thickening and small postoperative fluid collection in the presacral space, as above, following abdominal perineal resection. 3. Large right inguinal hernia containing several loops of small bowel, without evidence of bowel incarceration or obstruction at this  time.      01/14/2018 -  Radiation Therapy    Radiation with Dr. Lisbeth Renshaw starting 01/14/18. Plan to complete on 01/24/18      HISTORY OF PRESENTING ILLNESS (10/25/16):  David Clarke 62 y.o. male is here because of his recently diagnosed rectal adenocarcinoma. He was referred by his gastroenterologist title  parietal. Patient presents to my clinic, accompanied by his wife and his daughter.       He has noticed a anal mass accompanied by intermittent bleeding and pain in the rectum for the past 5 months. The patient initially presented to the [facility] ED on 10/03/16 with rectal bleeding and difficulty with hygiene. He was without health insurance previously and so did not pursue evaluation or treatment until this time.  The patient was seen by Dr. Hilarie Fredrickson for a colonoscopy and biopsy on 10/09/16, revealing a large partially obstructing low rectal mass, prolapsing to anal verge. Biopsy showed adenocarcinoma. CT C/A/P on 10/13/16 revealed a large, irregular, enhancing mass consistent with known rectal cancer. There were several suspicious small perirectal and small pelvic sidewall lymph nodes. Prominent perirectal vessels were also noted. MRI of the pelvis on 10/24/16 showed a large mass centered at the anus with extension into the low rectum without obstruction. There was suspicious mild mesorectal adenopathy, and a right inguinal hernia containing nonobstructive small bowel.   The patient comes to the clinic for consultation today accompanied by his partner and daughter. He denies abdominal cramps, nausea, bowel or bladder concerns. He reports taking OTC stool softener as needed. He reports his appetite is good, but he has lost 12-15 lbs in the past few weeks. His daughter notes she has hemochromatosis and questions if this could be playing a role in the patient's anemia.  CURRENT THERAPY:   -FOLFIRI every 2 weeks, panitumumab was added from cycle 3. Cycle 4 with 5-FU and Panitumumab only due to concurrent radiation, plan to change to FOLFIRINOX from cycle 5 on 01/31/18 due to disease progression.    INTERIM HISTORY:  David Clarke presents for follow up. He presents to the clinic today with his wife. He notes his rash on his face and neck has increased. He uses Clindamycin gel. He tolerated his radiation well so  far. He notes he had gotten supplement recommendations to help make his body stronger. He provided the names from a company named [employer].    On review of symptoms, pt notes his face rash is not itching or much painful. Pt notes his pain is normally a 2-3. Only is exacerbated by being on his feet for some time.     MEDICAL HISTORY:  Past Medical History:  Diagnosis Date  . Allergy   . Cancer ([place]) 10/09/2016   rectal adenocarcinoma  . Inguinal hernia     SURGICAL HISTORY: Past Surgical History:  Procedure Laterality Date  . COLONOSCOPY W/ BIOPSIES Bilateral 10/09/2016   rectal adenocarcinoma  . IR FLUORO GUIDE PORT INSERTION RIGHT  11/13/2017  . IR US GUIDE VASC ACCESS RIGHT  11/13/2017  . MOUTH SURGERY     age 62, to remove extra "eye" teeth  . [place] teeth extraction    . XI ROBOT ABDOMINAL PERINEAL RESECTION N/A 02/21/2017   Procedure: XI ROBOT ABDOMINOPERINEAL RESECTION WITH COLOSTOMY;  Surgeon: Michael Boston, MD;  Location: [facility];  Service: General;  Laterality: N/A;    SOCIAL HISTORY: Social History   Socioeconomic History  . Marital status: Soil scientist    Spouse name: Not  on file  . Number of children: 1  . Years of education: Not on file  . Highest education level: Not on file  Occupational History  . Occupation: Architectural technologist  Social Needs  . Financial resource strain: Not on file  . Food insecurity:    Worry: Not on file    Inability: Not on file  . Transportation needs:    Medical: Not on file    Non-medical: Not on file  Tobacco Use  . Smoking status: Former Smoker    Years: 2.00    Types: Cigarettes    Last attempt to quit: 2002    Years since quitting: 17.4  . Smokeless tobacco: Never Used  Substance and Sexual Activity  . Alcohol use: Not Currently    Alcohol/week: 6.0 oz    Types: 4 Glasses of wine, 6 Cans of beer per week    Comment: beer or a glass or wine most days of the week  . Drug use: No  . Sexual activity: Yes     Partners: Female  Lifestyle  . Physical activity:    Days per week: Not on file    Minutes per session: Not on file  . Stress: Not on file  Relationships  . Social connections:    Talks on phone: Not on file    Gets together: Not on file    Attends religious service: Not on file    Active member of club or organization: Not on file    Attends meetings of clubs or organizations: Not on file    Relationship status: Not on file  . Intimate partner violence:    Fear of current or ex partner: Not on file    Emotionally abused: Not on file    Physically abused: Not on file    Forced sexual activity: Not on file  Other Topics Concern  . Not on file  Social History Narrative  . Not on file    FAMILY HISTORY: Family History  Problem Relation Age of Onset  . Other Mother        bile duct tumor  . Stroke Mother   . Cancer Mother 29       bile duct cancer   . Prostate cancer Father 31  . Emphysema Father   . Colon cancer Neg Hx   . Esophageal cancer Neg Hx   . Pancreatic cancer Neg Hx   . Rectal cancer Neg Hx   . Stomach cancer Neg Hx     ALLERGIES:  is allergic to sulfa antibiotics.  MEDICATIONS:  Current Outpatient Medications  Medication Sig Dispense Refill  . acetaminophen (TYLENOL) 500 MG tablet Take 500 mg by mouth every 6 (six) hours as needed.    . clindamycin (CLINDAGEL) 1 % gel Apply topically 2 (two) times daily. 60 g 1  . gabapentin (NEURONTIN) 100 MG capsule Take 1 capsule (100 mg total) by mouth at bedtime. 30 capsule 0  . lidocaine-prilocaine (EMLA) cream Apply 1 application topically as needed. 30 g 2  . Multiple Vitamin (MULTIVITAMIN WITH MINERALS) TABS tablet Take 1 tablet by mouth every other day.     . nystatin (MYCOSTATIN) 100000 UNIT/ML suspension Take 5 mLs (500,000 Units total) by mouth 4 (four) times daily. 473 mL 0  . ondansetron (ZOFRAN) 8 MG tablet Take 1 tablet (8 mg total) by mouth 2 (two) times daily as needed for refractory nausea / vomiting.  Start on day 3 after chemotherapy. 30 tablet 1  . prochlorperazine (COMPAZINE)  10 MG tablet Take 1 tablet (10 mg total) by mouth every 6 (six) hours as needed (NAUSEA). 30 tablet 1  . traMADol (ULTRAM) 50 MG tablet Take 1-2 tablets (50-100 mg total) by mouth every 8 (eight) hours as needed. 60 tablet 0  . Cyanocobalamin (VITAMIN B 12 PO) Take 1 tablet by mouth daily.    Marland Kitchen dexamethasone (DECADRON) 4 MG tablet Take 2 tablets (8 mg total) by mouth daily. Start the day after chemo for 2 days. (Patient not taking: Reported on 01/17/2018) 8 tablet 5  . doxycycline (VIBRA-TABS) 100 MG tablet Take 1 tablet (100 mg total) by mouth 2 (two) times daily. 60 tablet 2  . lidocaine-prilocaine (EMLA) cream Apply to affected area once (Patient not taking: Reported on 01/17/2018) 30 g 3  . loperamide (IMODIUM A-D) 2 MG tablet Take 1 tablet (2 mg total) by mouth 4 (four) times daily as needed. Take 2 at diarrhea onset , then 1 every 2hr until 12hrs with no BM. May take 2 every 4hrs at night. If diarrhea recurs repeat. (Patient not taking: Reported on 01/17/2018) 100 tablet 1  . LORazepam (ATIVAN) 1 MG tablet Take 1 tablet (1 mg total) by mouth every 6 (six) hours as needed (NAUSEA). (Patient not taking: Reported on 01/17/2018) 30 tablet 0   Current Facility-Administered Medications  Medication Dose Route Frequency Provider Last Rate Last Dose  . 0.9 %  sodium chloride infusion  500 mL Intravenous Continuous Pyrtle, Lajuan Lines, MD      . 0.9 %  sodium chloride infusion  500 mL Intravenous Once Pyrtle, Lajuan Lines, MD        REVIEW OF SYSTEMS:  Constitutional: Denies fevers, chills or abnormal night sweats  Eyes: Denies blurriness of vision, double vision or watery eyes Ears, nose, mouth, throat, and face: Denies mucositis or sore throat  Respiratory: Denies cough, dyspnea or wheezes Cardiovascular: Denies palpitation, chest discomfort or  Gastrointestinal:  Denies nausea, heartburn or change in bowel habits (+) colostomy bag    Skin: (+) rash on face and neck Lymphatics: Denies new lymphadenopathy or easy bruising MSK:  (+) right glute pain and rigth mid-thigh numbness, with limp, well controlled Neurological: (+) improved neuropathy, mild   Behavioral/Psych: Mood is stable, no new changes  All other systems were reviewed with the patient and are negative.   PHYSICAL EXAMINATION:   ECOG PERFORMANCE STATUS: 2  Vitals:   01/17/18 1121  BP: 118/83  Pulse: 83  Resp: 18  Temp: 98.1 F (36.7 C)  SpO2: 100%   Filed Weights   01/17/18 1121  Weight: 137 lb 1.6 oz (62.2 kg)     GENERAL: alert, no distress and comfortable.  SKIN: skin color, texture, turgor are normal, no rashes or significant lesions NECK: supple, thyroid normal size, non-tender, without nodularity LYMPH: (+) 2 cm left supraclavicular lymph node is now barely palpable, non-tender LUNGS: clear to auscultation and percussion with normal breathing effort HEART: regular rate & rhythm and no murmurs and no lower extremity edema ABDOMEN: grapefruit size mass palpated in the RLQ. abdomen soft, non-tender and normal bowel sounds (+) laparoscopic incisions all healed Musculoskeletal: no cyanosis of digits and no clubbing  PSYCH: alert & oriented x 3 with fluent speech NEURO: no focal motor/sensory deficits.   LABORATORY DATA:  I have reviewed the data as listed CBC Latest Ref Rng & Units 01/17/2018 01/03/2018 12/25/2017  WBC 4.0 - 10.3 K/uL 5.2 10.5(H) 2.0(L)  Hemoglobin 13.0 - 17.1 g/dL 10.7(L) 11.5(L) 12.2(L)  Hematocrit 38.4 - 49.9 % 32.2(L) 34.1(L) 35.6(L)  Platelets 140 - 400 K/uL 381 376 351   CMP Latest Ref Rng & Units 01/17/2018 01/03/2018 12/25/2017  Glucose 70 - 140 mg/dL 143(H) 106 98  BUN 7 - 26 mg/dL _0 Creatinine 0.70 - 1.30 mg/dL 0.72 0.86 0.80  Sodium 136 - 145 mmol/L 132(L) 133(L) 134(L)  Potassium 3.5 - 5.1 mmol/L 3.5 4.2 3.8  Chloride 98 - 109 mmol/L 99 98 101  CO2 22 - 29 mmol/L _1 Calcium 8.4 - 10.4 mg/dL 9.0  10.3 9.9  Total Protein 6.4 - 8.3 g/dL 7.5 8.3 7.6  Total Bilirubin 0.2 - 1.2 mg/dL 0.8 0.7 0.8  Alkaline Phos 40 - 150 U/L 149 156(H) 120  AST 5 - 34 U/L _2 ALT 0 - 55 U/L _3 CE[facility] ([facility])  10/27/2016: 7.81 11/27/2016: 5.56 01/10/2017: 5.61 03/14/17: 5.11 04/11/17: 5.87 05/23/17: 8.79 06/19/17:  9.15 07/16/17:  9.88 08/10/17: 10.40 09/18/17: 8.31 10/30/17: 8.11 11/20/17: 7.96 12/25/2017: 13.95 01/17/18: PENDING    PATHOLOGY:  10/29/17 Diagnosis Lymph node, needle/core biopsy, left supraclavicular - METASTATIC ADENOCARCINOMA, CONSISTENT WITH COLORECTAL PRIMARY. - SEE COMMENT. Microscopic Comment The malignant cells are positive for CDX-2 and cytokeratin 20. They are negative for cytokeratin 7, p63 and cytokeratin 5/6. The findings are consistent with primary colorectal adenocarcinoma. Dr. Thressa Sheller has reviewed the case and concurs with this interpretation. Dr. Burr Medico was paged on 10/30/17. Additional studies can be performed upon clinician request. (JBK:gt, 10/31/17)    Diagnosis 02/21/17 1. Colon, segmental resection for tumor, rectosigmoid - INVASIVE ADENOCARCINOMA, MODERATELY DIFFERENTIATED, SPANNING 3.9 CM. - TUMOR INVADES THROUGH MUSCULARIS PROPRIA. - RESECTION MARGINS ARE NEGATIVE. - EIGHT OF EIGHT LYMPH NODES NEGATIVE FOR CARCINOMA (0/8), SEE COMMENT. - SEE ONCOLOGY TABLE. 2. Soft tissue, biopsy, left lateral pelvic floor - BENIGN SKELETAL MUSCLE AND SOFT TISSUE. - NO MALIGNANCY IDENTIFIED. 3. Colon, segmental resection, proximal sigmoid - BENIGN COLONIC MUCOSA. - NO DYSPLASIA OR MALIGNANCY. 4. Liver, biopsy, liver mass - BENIGN VASCULAR PROLIFERATION CONSISTENT WITH HEMANGIOMA. - NO MALIGNANCY IDENTIFIED. Microscopic Comment 1. COLON AND RECTUM (INCLUDING TRANS-ANAL RESECTION): Specimen: Rectosigmoid colon with anus. Procedure: Abdominoperineal resection. Tumor site: Distal 1/3 rectum to anus. Specimen integrity: Intact. Macroscopic  intactness of mesorectum: Incomplete. Macroscopic tumor perforation: Not identified. Invasive tumor: Maximum size: 3.9 cm Histologic type(s): Invasive adenocarcinoma. Histologic grade and differentiation: G2: moderately differentiated/low grade Type of polyp in which invasive carcinoma arose: N/A. Microscopic extension of invasive tumor: Tumor invades through muscularis propria. Lymph-Vascular invasion: Not identified. 1 of 3 FINAL for RONN, SMOLINSKY [phone]) Microscopic Comment(continued) Peri-neural invasion: Present. Tumor deposit(s) (discontinuous extramural extension): Not identified. Resection margins: Proximal margin: Negative. Distal margin: Negative. Circumferential (radial) (posterior ascending, posterior descending; lateral and posterior mid-rectum; and entire lower 1/3 rectum): Negative. Mesenteric margin (sigmoid and transverse): Negative. Distance closest margin (if all above margins negative): <0.1 cm radial margin. >1 cm distal margin. Treatment effect (neo-adjuvant therapy): Present Additional polyp(s): N/A. Non-neoplastic findings: None. Lymph nodes: number examined 8; number positive: 0 (treatment effect present). Pathologic Staging: ypT3, ypN0, ypMX Ancillary studies: Can be performed upon request. Comment: The fat was cleared and additional lymph nodes were not identified.  Diagnosis 10/09/2016 Rectum, biopsy, mass - ADENOCARCINOMA.  RADIOGRAPHIC STUDIES: I have personally reviewed the radiological images as listed and agreed with the findings in the report.  CT CAP with contrast, 01/08/2018 IMPRESSION: 1. Today's study demonstrates dramatic progression of  disease with interval development of widespread pulmonary metastases, multiple new liver lesions and progressive lymphadenopathy noted in the pelvis, retroperitoneum and left supraclavicular nodal stations. In addition, the previously noted metastatic lesion in the right psoas muscle has markedly  increased in size. Whether or not this reflects growth of the lesion and/or internal hemorrhage of the lesion is uncertain. 2. Stable postoperative thickening and small postoperative fluid collection in the presacral space, as above, following abdominal perineal resection. 3. Large right inguinal hernia containing several loops of small bowel, without evidence of bowel incarceration or obstruction at this time.  PET Scan 10/17/17 IMPRESSION: Increased hypermetabolic lymphadenopathy in abdominal retroperitoneum, right common iliac chain, and left supraclavicular region, consistent with metastatic disease. Stable tiny bilateral pulmonary nodules show no FDG uptake but are too small to characterize by PET. Recommend continued attention on follow-up CT. 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   CT CAP W Contrast 09/18/17 IMPRESSION: Interval abdomino-peritoneal resection with left lower quadrant colostomy. Increased diffuse cystitis, likely due to radiation. New presacral soft tissue density with central low attenuation, also suspicious for post treatment changes. Recommend continued attention on follow-up imaging. New mild retroperitoneal lymphadenopathy in the aortocaval space, measuring up to 1.8 cm. This is suspicious for metastatic disease, although it is conceivable that this could be reactive in etiology due to interval surgery and radiation therapy. Recommend short-term follow-up by CT in 3 months. New indeterminate 4 mm left upper lobe pulmonary nodule, likely inflammatory in etiology with metastatic disease considered less likely. Recommend continued attention on follow-up CT. Bilateral inguinal hernias, largest on the right containing several small bowel loops.   Colonoscopy 08/15/17 IMPRESSION - Patent end colostomy with healthy appearing mucosa in the sigmoid colon. - The entire examined colon is normal. - No specimens  collected.  MRI Pelvis W WO Contrast 01/29/17 IMPRESSION: Significant decrease in size of soft tissue mass at the anorectal junction, with post radiation changes. No residual perirectal lymphadenopathy or other signs of pelvic metastatic disease. Stable small right inguinal hernia containing small bowel.  CT A/P W Contrast 12/07/16 IMPRESSION: 1. Abnormal dilatation of the proximal small bowel loops worrisome for either small bowel obstruction versus small bowel ileus. 2. 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 3. Large right inguinal hernia containing edematous loop of distal small bowel 4. No pneumatosis or bowel perforation.  No abscess. 5. Aortic atherosclerosis 6. Mild decrease in size of rectal mass. No change in right common iliac adenopathy.  DG Chest 2 View 12/03/2016 IMPRESSION: 1. No active cardiopulmonary disease. No evidence of pneumonia or pulmonary edema. No evidence of intrathoracic metastasis. 2. Hyperexpanded lungs suggesting COPD.  MRI Pelvis 10/24/2016 IMPRESSION: 1. Moderately motion degraded exam. 2. Large mass is centered at the anus with extension into the low rectum. No complicating obstruction. 3. Mild mesorectal adenopathy, suspicious. 4. Right inguinal hernia containing nonobstructive small bowel.  CT CAP w Contrast 10/13/2016 IMPRESSION: Large irregular enhancing rectal mass consistent with known rectal cancer. There are small perirectal and small pelvic sidewall lymph nodes which are suspicious. Prominent perirectal vessels are also noted. No evidence of metastatic retroperitoneal or sigmoid mesocolon adenopathy or hepatic or pulmonary metastasis.  COLONOSCOPY 10/09/2016 Dr. Hilarie Fredrickson  A fungating, infiltrative and ulcerated partially obstructing large mass was found in the distal rectum. The mass was circumferential. The mass measured seven cm in  length (prolapsing and from dentate line to approx  7 cm). In addition, its inner diameter measured nine mm. Oozing was present. This was biopsied with a cold forceps for histology. - Due to the distal rectal mass the bowel preparation was poor and unable to be cleared via the adult upper endoscope (adult upper endoscope used due to obstructing mass). Scope not advanced proximal to the rectosigmoid colon.  ASSESSMENT & PLAN: 62 y.o.  gentleman, previously healthy, presented with an anal mass and a mild intermittent rectal bleeding for 5-6 months. Fatigue, anorexia, fatigue and weight loss for a month. He is currently on radiation for his rectal cancer, chemotherapy has been held lately.  1. Rectal Adenocarcinoma, low rectum, [id number], ypT3N0, stage IIIB, MSI-stable, nodes metastasis 09/2017, KRAS/NRAS/BRAF wild type  -I previously reviewed his CT and MRI scan, colonoscopy and biopsy findings in detail with pt and his family members -Based on his imaging findings, he had clinical stage III disease. We reviewed his image in our GI tumor Board, he also has a small liver lesion which is indeterminate, likely benign, and we will follow up with a repeated CT in about 3 months.  -patient received neoadjuvant chemotherapy and radiation with Xeloda, which was held for the last few weeks due to infectious colitis. -He underwent APR on 02/21/2017, surgical margins were negative, all lymph nodes were negative. We've reviewed his surgical pathology findings he did have significant treatment effect on his surgical sample.  -He recovered well from surgery, started adjuvant CAPOX 04/04/17. He completed 5 cycles of oxaliplatin on 07/16/17 and completed Xeloda on 08/12/17. His oxaliplatin treatment was postponed a few times due to his working schedule -He had repeated colonoscopy by Dr. Hilarie Fredrickson in 07/2017 which was negative  -We discussed his CT CAP from 09/18/17 which shows multiple slightly enlarged RP lymph nodes and a 4  mm lung nodule in the left lower lobe. PET Scan from 10/17/17 reveals increased hypermetabolic lymphadenopathy in abdominal retroperitoneum, right common iliac chain, and left supraclavicular region, consistent with metastatic disease and stable tiny bilateral pulmonary nodules show no FDG uptake but are too small to characterize by PET. I discussed results with pt and his family. I highly suspect that this is metastatic disease. He will get a biopsy to confirm this.  -He underwent left supraclavicular lymph node biopsy on 10/29/17.  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 Given his rapid Recurrence (2 months) after completion of adjuvant Capox, I recommend FOLFIRI as his next line therapy. -He went to [facility] for 2nd opinion with Dr. Aleatha Borer on 3/18 and understands he is not a candidate for clinical trial [id number] at [facility] and chose to proceed with current chemotherapy plan.  -He started FOLFIRI on 11/20/17 and has tolerated first cycle well.  -His FO genomic testing showed MSI stable disease, K-ras, NRAS, BRAF wild-type, no other targetable mutations.  Panitumumab was added on from cycle 3.  -Due to his worsening right flank pain, he was referred to radiation Dr. Lisbeth Renshaw for palliative radiation. -I previously reviewed his recent restaging CT scan findings with patient and his family, unfortunately he has dramatic progression of the right metastatic lesion in the right psoas muscle which is 13.9 cm now, and he also developed new pulmonary, liver metastasis.  We discussed this could be partially related to his delay of treatment, and he has had only 3 cycle chemo, 1 cycle of panitumumab.  Unfortunately the biology of his tumor is extremely aggressive, I recommend him  to consider more intense chemotherapy FOLFIRINOX and continue panitumumab -He started radiation on 01/14/18, tolerating well so far. I have discussed with Dr.  Lisbeth Renshaw, and he agrees to shorten his radiation course to 8 treatments, to avoid further delay of his chemo treatment. Plan to complete radiation on 01/24/18 and start FOLFIRINOX plus Vectibix on 01/31/18 -We will obtain restaging scans in 2 months for close follow-up -I have communicated with [facility]. Paticia Stack at [facility] regarding clinical trials option that the patient may qualify for.  -The radiation this Monday, tolerating well, his right flank pain is slightly improved. -Labs reveiwed and overall adequate to proceed with chemo, due to his concurrent radiation, will proceed with Vectibix and 5-FU only today  -I will review supplement options for patient with our pharmacist. -F/u in 2 weeks, and change his treatment to FOLFIRINOX and continue panitumumab    2. 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 -Hemoglobin was 8.3 initially with low MCV, likely anemia of iron deficiency from chronic blood loss. This was confirmed by his iron study. 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 -he previously took some oral iron also -will monitor his CBC and iron level closely. -Hg decreased to 10.7 today (01/17/18)  3. Peripheral neuropathy, secondary to chemo, G1 -mild numbness, no decreased sensation on exam, hand function is normal  -He has slightly more noticeable neuropathy, but hand functions are normal and adequate for treatment -continue monitoring closely  -He takes B complex and his neuropathy has improved -Mild neuropathy -He has been taking 1 tablet of 100 mg gabapentin at night.  -I advised that the patient  can increase his dose to 2 tablets 100 mg at night and if he is not sleepy in the morning, then he can take 1 tablet in the morning.  -I informed the patient to titrate his night dose to 300 mg nightly within the next 1.5 weeks.   4.Goal of care discussion  -We again discussed the incurable nature of his cancer, and the overall poor prognosis due to the metastatic disease, especially if he does not have good response to chemotherapy or progress on chemo -The patient understands the goal of care is palliative, to prolong his life. -He is full code for now   5. Right sacral and anterolateral upper leg pain with associated numbness to mid-thigh   -Likely secondary to right iliac adenopathy and psoas involvement. Has been taking tramadol 2 tabs BID and supplementing with tylenol as well as integrating non-pharm measures.  -Pain is well managed but interfering with sleep. His work requires him to be on his feet for prolonged periods. He does not like "fog" and constipation effects of tramadol, therefore not interested in escalating opioids at this time.  -Patient is only on Neurontin 100 mg at night, I recommended him to increase dose gradually to 337m HS, and add 100-201min the morning, dose can eventually increased to 300 twice 3 times daily -Pain is well controlled    6. Constipation  -He uses stool softener and miralax PRN. I reviewed potential for diarrhea associated with irinotecan.  -He is holding laxative for now. He understands indications  for using laxative vs anti-diarrheal if he develops change in his bowel habits.  -I previously encouraged him to increase water intake if he develops diarrhea and call clinic if not controlled by imodium -Constipation resolved currently, likely due to chemotherapy.  -He is taking senakot and OTC miralax to aid with this. I advised the patient to increase -I discussed with the patient and his family regarding magnesium citrate as a back up if he hasn't  had relief with senakot or miralax.   7. Rash on face and neck -Secondary to Vectibix -He will continue Clindamycin gel and hydrocortisone cream. I will prescribe doxycycline to help control his rash.  -I recommend he use a hat and sunscreen when out in the sun.    Plan   -Prescribe doxycycline today  -Labs reveiwed and adequate to proceed with chemo, will give Vectibix and 5-FU only today  -Lab, flush, f/u with me or Lacie and chemo FOLFIRINOX and panitumumab in 2, 4 and 6 weeks in the morning    All questions were answered. The patient knows to call the clinic with any problems, questions or concerns.  I spent 20 minutes counseling the patient face to face. The total time spent in the appointment was 25 minutes and more than 50% was on counseling.   Truitt Merle, MD 01/17/2018   This document serves as a record of services personally performed by Truitt Merle, MD. It was created on her behalf by Joslyn Devon, a trained medical scribe. The creation of this record is based on the scribe's personal observations and the provider's statements to them.   I have reviewed the above documentation for accuracy and completeness, and I agree with the above.

## 2018-01-17 ENCOUNTER — Inpatient Hospital Stay (HOSPITAL_BASED_OUTPATIENT_CLINIC_OR_DEPARTMENT_OTHER): Payer: BLUE CROSS/BLUE SHIELD | Admitting: Hematology

## 2018-01-17 ENCOUNTER — Encounter: Payer: Self-pay | Admitting: Hematology

## 2018-01-17 ENCOUNTER — Encounter: Payer: Self-pay | Admitting: General Practice

## 2018-01-17 ENCOUNTER — Inpatient Hospital Stay: Payer: BLUE CROSS/BLUE SHIELD

## 2018-01-17 ENCOUNTER — Ambulatory Visit
Admission: RE | Admit: 2018-01-17 | Discharge: 2018-01-17 | Disposition: A | Discharge status: Home / Self Care | Payer: BLUE CROSS/BLUE SHIELD | Source: Ambulatory Visit | Attending: Radiation Oncology | Admitting: Radiation Oncology

## 2018-01-17 VITALS — BP 118/83 | HR 83 | Temp 98.1°F | Resp 18 | Ht 72.0 in | Wt 137.1 lb

## 2018-01-17 DIAGNOSIS — M533 Sacrococcygeal disorders, not elsewhere classified: Secondary | ICD-10-CM

## 2018-01-17 DIAGNOSIS — C7989 Secondary malignant neoplasm of other specified sites: Secondary | ICD-10-CM | POA: Diagnosis not present

## 2018-01-17 DIAGNOSIS — M79604 Pain in right leg: Secondary | ICD-10-CM

## 2018-01-17 DIAGNOSIS — C20 Malignant neoplasm of rectum: Secondary | ICD-10-CM | POA: Diagnosis not present

## 2018-01-17 DIAGNOSIS — C770 Secondary and unspecified malignant neoplasm of lymph nodes of head, face and neck: Secondary | ICD-10-CM | POA: Diagnosis not present

## 2018-01-17 DIAGNOSIS — R21 Rash and other nonspecific skin eruption: Secondary | ICD-10-CM

## 2018-01-17 DIAGNOSIS — G620 Drug-induced polyneuropathy: Secondary | ICD-10-CM

## 2018-01-17 DIAGNOSIS — C7800 Secondary malignant neoplasm of unspecified lung: Secondary | ICD-10-CM | POA: Diagnosis not present

## 2018-01-17 DIAGNOSIS — C787 Secondary malignant neoplasm of liver and intrahepatic bile duct: Secondary | ICD-10-CM | POA: Diagnosis not present

## 2018-01-17 DIAGNOSIS — Z148 Genetic carrier of other disease: Secondary | ICD-10-CM

## 2018-01-17 DIAGNOSIS — Z95828 Presence of other vascular implants and grafts: Secondary | ICD-10-CM

## 2018-01-17 DIAGNOSIS — D500 Iron deficiency anemia secondary to blood loss (chronic): Secondary | ICD-10-CM | POA: Diagnosis not present

## 2018-01-17 DIAGNOSIS — K5900 Constipation, unspecified: Secondary | ICD-10-CM

## 2018-01-17 DIAGNOSIS — T451X5A Adverse effect of antineoplastic and immunosuppressive drugs, initial encounter: Secondary | ICD-10-CM

## 2018-01-17 DIAGNOSIS — Z87891 Personal history of nicotine dependence: Secondary | ICD-10-CM

## 2018-01-17 LAB — COMPREHENSIVE METABOLIC PANEL
ALBUMIN: 3.6 g/dL (ref 3.5–5.0)
ALT: 12 U/L (ref 0–55)
ANION GAP: 9 (ref 3–11)
AST: 17 U/L (ref 5–34)
Alkaline Phosphatase: 149 U/L (ref 40–150)
BUN: 15 mg/dL (ref 7–26)
CO2: 24 mmol/L (ref 22–29)
Calcium: 9 mg/dL (ref 8.4–10.4)
Chloride: 99 mmol/L (ref 98–109)
Creatinine, Ser: 0.72 mg/dL (ref 0.70–1.30)
GFR calc non Af Amer: >60 mL/min (ref >60)
GLUCOSE: 143 mg/dL — AB (ref 70–140)
POTASSIUM: 3.5 mmol/L (ref 3.5–5.1)
Sodium: 132 mmol/L — ABNORMAL LOW (ref 136–145)
TOTAL PROTEIN: 7.5 g/dL (ref 6.4–8.3)
Total Bilirubin: 0.8 mg/dL (ref 0.2–1.2)

## 2018-01-17 LAB — CBC WITH DIFFERENTIAL/PLATELET
BASOS PCT: 0 %
Basophils Absolute: 0 10*3/uL (ref 0.0–0.1)
EOS ABS: 0.1 10*3/uL (ref 0.0–0.5)
Eosinophils Relative: 1 %
HEMATOCRIT: 32.2 % — AB (ref 38.4–49.9)
Hemoglobin: 10.7 g/dL — ABNORMAL LOW (ref 13.0–17.1)
Lymphocytes Relative: 5 %
Lymphs Abs: 0.2 10*3/uL — ABNORMAL LOW (ref 0.9–3.3)
MCH: 32.7 pg (ref 27.2–33.4)
MCHC: 33.2 g/dL (ref 32.0–36.0)
MCV: 98.5 fL — ABNORMAL HIGH (ref 79.3–98.0)
MONO ABS: 0.5 10*3/uL (ref 0.1–0.9)
MONOS PCT: 9 %
NEUTROS ABS: 4.4 10*3/uL (ref 1.5–6.5)
Neutrophils Relative %: 85 %
Platelets: 381 10*3/uL (ref 140–400)
RBC: 3.27 MIL/uL — ABNORMAL LOW (ref 4.20–5.82)
RDW: 15.7 % — AB (ref 11.0–14.6)
WBC: 5.2 10*3/uL (ref 4.0–10.3)

## 2018-01-17 LAB — MAGNESIUM: MAGNESIUM: 1.9 mg/dL (ref 1.7–2.4)

## 2018-01-17 LAB — FERRITIN: FERRITIN: 566 ng/mL — AB (ref 22–316)

## 2018-01-17 LAB — CEA (IN HOUSE-CHCC): CEA (CHCC-IN HOUSE): 10.68 ng/mL — AB (ref 0.00–5.00)

## 2018-01-17 LAB — IRON AND TIBC
IRON: 78 ug/dL (ref 42–163)
Saturation Ratios: 27 % — ABNORMAL LOW (ref 42–163)
TIBC: 285 ug/dL (ref 202–409)
UIBC: 207 ug/dL

## 2018-01-17 MED ORDER — DEXAMETHASONE SODIUM PHOSPHATE 10 MG/ML IJ SOLN: 10.0000 mg | Freq: Once | INTRAMUSCULAR | Status: DC

## 2018-01-17 MED ORDER — SODIUM CHLORIDE 0.9 % IV SOLN
6.0000 mg/kg | Freq: Once | INTRAVENOUS | Status: AC
Start: 2018-01-17 — End: 2018-01-17
  Administered 2018-01-17: 400 mg via INTRAVENOUS
  Filled 2018-01-17: qty 20

## 2018-01-17 MED ORDER — LEUCOVORIN CALCIUM INJECTION 350 MG
400.0000 mg/m2 | Freq: Once | INTRAMUSCULAR | Status: AC
Start: 2018-01-17 — End: 2018-01-17
  Administered 2018-01-17: 756 mg via INTRAVENOUS
  Filled 2018-01-17: qty 37.8

## 2018-01-17 MED ORDER — SODIUM CHLORIDE 0.9% FLUSH
10.0000 mL | INTRAVENOUS | Status: DC | PRN
  Administered 2018-01-17: 10 mL via INTRAVENOUS
  Filled 2018-01-17: qty 10

## 2018-01-17 MED ORDER — PALONOSETRON HCL INJECTION 0.25 MG/5ML
0.2500 mg | Freq: Once | INTRAVENOUS | Status: AC
  Administered 2018-01-17: 0.25 mg via INTRAVENOUS

## 2018-01-17 MED ORDER — SODIUM CHLORIDE 0.9 % IV SOLN
2400.0000 mg/m2 | INTRAVENOUS | Status: DC
  Administered 2018-01-17: 4550 mg via INTRAVENOUS
  Filled 2018-01-17: qty 91

## 2018-01-17 MED ORDER — SODIUM CHLORIDE 0.9 % IV SOLN
Freq: Once | INTRAVENOUS | Status: AC
  Administered 2018-01-17: 13:00:00 via INTRAVENOUS

## 2018-01-17 MED ORDER — DOXYCYCLINE HYCLATE 100 MG PO TABS: 100.0000 mg | ORAL_TABLET | Freq: Two times a day (BID) | ORAL | 2 refills | Status: DC

## 2018-01-17 MED ORDER — SODIUM CHLORIDE 0.9 % IV SOLN: Freq: Once | INTRAVENOUS | Status: AC

## 2018-01-17 MED ORDER — PALONOSETRON HCL INJECTION 0.25 MG/5ML
INTRAVENOUS | Status: AC
  Filled 2018-01-17: qty 5

## 2018-01-17 NOTE — Patient Instructions (Signed)
[facility] Discharge Instructions for Patients Receiving Chemotherapy  Today you received the following chemotherapy agents Leucovorin, Irinotecan, 5FU, Vectibix  To help prevent nausea and vomiting after your treatment, we encourage you to take your nausea medication as prescribed.   If you develop nausea and vomiting that is not controlled by your nausea medication, call the clinic.   BELOW ARE SYMPTOMS THAT SHOULD BE REPORTED IMMEDIATELY:  *FEVER GREATER THAN 100.5 F  *CHILLS WITH OR WITHOUT FEVER  NAUSEA AND VOMITING THAT IS NOT CONTROLLED WITH YOUR NAUSEA MEDICATION  *UNUSUAL SHORTNESS OF BREATH  *UNUSUAL BRUISING OR BLEEDING  TENDERNESS IN MOUTH AND THROAT WITH OR WITHOUT PRESENCE OF ULCERS  *URINARY PROBLEMS  *BOWEL PROBLEMS  UNUSUAL RASH Items with * indicate a potential emergency and should be followed up as soon as possible.  Feel free to call the clinic should you have any questions or concerns. The clinic phone number is [phone].  Please show the [facility] at check-in to the Emergency Department and triage nurse.   Leucovorin injection What is this medicine? LEUCOVORIN (loo koe VOR in) is used to prevent or treat the harmful effects of some medicines. This medicine is used to treat anemia caused by a low amount of folic acid in the body. It is also used with 5-fluorouracil (5-FU) to treat colon cancer. This medicine may be used for other purposes; ask your health care provider or pharmacist if you have questions. What should I tell my health care provider before I take this medicine? They need to know if you have any of these conditions: -anemia from low levels of vitamin B-12 in the blood -an unusual or allergic reaction to leucovorin, folic acid, other medicines, foods, dyes, or preservatives -pregnant or trying to get pregnant -breast-feeding How should I use this medicine? This medicine is for injection into a muscle or into a  vein. It is given by a health care professional in a hospital or clinic setting. Talk to your pediatrician regarding the use of this medicine in children. Special care may be needed. Overdosage: If you think you have taken too much of this medicine contact a poison control center or emergency room at once. NOTE: This medicine is only for you. Do not share this medicine with others. What if I miss a dose? This does not apply. What may interact with this medicine? -capecitabine -fluorouracil -phenobarbital -phenytoin -primidone -trimethoprim-sulfamethoxazole This list may not describe all possible interactions. Give your health care provider a list of all the medicines, herbs, non-prescription drugs, or dietary supplements you use. Also tell them if you smoke, drink alcohol, or use illegal drugs. Some items may interact with your medicine. What should I watch for while using this medicine? Your condition will be monitored carefully while you are receiving this medicine. This medicine may increase the side effects of 5-fluorouracil, 5-FU. Tell your doctor or health care professional if you have diarrhea or mouth sores that do not get better or that get worse. What side effects may I notice from receiving this medicine? Side effects that you should report to your doctor or health care professional as soon as possible: -allergic reactions like skin rash, itching or hives, swelling of the face, lips, or tongue -breathing problems -fever, infection -mouth sores -unusual bleeding or bruising -unusually weak or tired Side effects that usually do not require medical attention (report to your doctor or health care professional if they continue or are bothersome): -constipation or diarrhea -loss of  appetite -nausea, vomiting This list may not describe all possible side effects. Call your doctor for medical advice about side effects. You may report side effects to FDA at 1-800-FDA-1088. Where should  I keep my medicine? This drug is given in a hospital or clinic and will not be stored at home. NOTE: This sheet is a summary. It may not cover all possible information. If you have questions about this medicine, talk to your doctor, pharmacist, or health care provider.  2018 Elsevier/Gold Standard (2008-02-18 16:50:29)   Irinotecan injection What is this medicine? IRINOTECAN (ir in oh TEE kan ) is a chemotherapy drug. It is used to treat colon and rectal cancer. This medicine may be used for other purposes; ask your health care provider or pharmacist if you have questions. COMMON BRAND NAME(S): Camptosar What should I tell my health care provider before I take this medicine? They need to know if you have any of these conditions: -blood disorders -dehydration -diarrhea -infection (especially a virus infection such as chickenpox, cold sores, or herpes) -liver disease -low blood counts, like low white cell, platelet, or red cell counts -recent or ongoing radiation therapy -an unusual or allergic reaction to irinotecan, sorbitol, other chemotherapy, other medicines, foods, dyes, or preservatives -pregnant or trying to get pregnant -breast-feeding How should I use this medicine? This drug is given as an infusion into a vein. It is administered in a hospital or clinic by a specially trained health care professional. Talk to your pediatrician regarding the use of this medicine in children. Special care may be needed. Overdosage: If you think you have taken too much of this medicine contact a poison control center or emergency room at once. NOTE: This medicine is only for you. Do not share this medicine with others. What if I miss a dose? It is important not to miss your dose. Call your doctor or health care professional if you are unable to keep an appointment. What may interact with this medicine? Do not take this medicine with any of the following medications: -atazanavir -certain  medicines for fungal infections like itraconazole and ketoconazole -[place]. [place]'s Wort This medicine may also interact with the following medications: -dexamethasone -diuretics -laxatives -medicines for seizures like carbamazepine, mephobarbital, phenobarbital, phenytoin, primidone -medicines to increase blood counts like filgrastim, pegfilgrastim, sargramostim -prochlorperazine -vaccines This list may not describe all possible interactions. Give your health care provider a list of all the medicines, herbs, non-prescription drugs, or dietary supplements you use. Also tell them if you smoke, drink alcohol, or use illegal drugs. Some items may interact with your medicine. What should I watch for while using this medicine? Your condition will be monitored carefully while you are receiving this medicine. You will need important blood work done while you are taking this medicine. This drug may make you feel generally unwell. This is not uncommon, as chemotherapy can affect healthy cells as well as cancer cells. Report any side effects. Continue your course of treatment even though you feel ill unless your doctor tells you to stop. In some cases, you may be given additional medicines to help with side effects. Follow all directions for their use. You may get drowsy or dizzy. Do not drive, use machinery, or do anything that needs mental alertness until you know how this medicine affects you. Do not stand or sit up quickly, especially if you are an older patient. This reduces the risk of dizzy or fainting spells. Call your doctor or health care professional for advice if  you get a fever, chills or sore throat, or other symptoms of a cold or flu. Do not treat yourself. This drug decreases your body's ability to fight infections. Try to avoid being around people who are sick. This medicine may increase your risk to bruise or bleed. Call your doctor or health care professional if you notice any unusual  bleeding. Be careful brushing and flossing your teeth or using a toothpick because you may get an infection or bleed more easily. If you have any dental work done, tell your dentist you are receiving this medicine. Avoid taking products that contain aspirin, acetaminophen, ibuprofen, naproxen, or ketoprofen unless instructed by your doctor. These medicines may hide a fever. Do not become pregnant while taking this medicine. Women should inform their doctor if they wish to become pregnant or think they might be pregnant. There is a potential for serious side effects to an unborn child. Talk to your health care professional or pharmacist for more information. Do not breast-feed an infant while taking this medicine. What side effects may I notice from receiving this medicine? Side effects that you should report to your doctor or health care professional as soon as possible: -allergic reactions like skin rash, itching or hives, swelling of the face, lips, or tongue -low blood counts - this medicine may decrease the number of white blood cells, red blood cells and platelets. You may be at increased risk for infections and bleeding. -signs of infection - fever or chills, cough, sore throat, pain or difficulty passing urine -signs of decreased platelets or bleeding - bruising, pinpoint red spots on the skin, black, tarry stools, blood in the urine -signs of decreased red blood cells - unusually weak or tired, fainting spells, lightheadedness -breathing problems -chest pain -diarrhea -feeling faint or lightheaded, falls -flushing, runny nose, sweating during infusion -mouth sores or pain -pain, swelling, redness or irritation where injected -pain, swelling, warmth in the leg -pain, tingling, numbness in the hands or feet -problems with balance, talking, walking -stomach cramps, pain -trouble passing urine or change in the amount of urine -vomiting as to be unable to hold down drinks or  food -yellowing of the eyes or skin Side effects that usually do not require medical attention (report to your doctor or health care professional if they continue or are bothersome): -constipation -hair loss -headache -loss of appetite -nausea, vomiting -stomach upset This list may not describe all possible side effects. Call your doctor for medical advice about side effects. You may report side effects to FDA at 1-800-FDA-1088. Where should I keep my medicine? This drug is given in a hospital or clinic and will not be stored at home. NOTE: This sheet is a summary. It may not cover all possible information. If you have questions about this medicine, talk to your doctor, pharmacist, or health care provider.  2018 Elsevier/Gold Standard (2013-02-10 16:29:32)   Fluorouracil, 5FU; Diclofenac topical cream What is this medicine? FLUOROURACIL; DICLOFENAC (flure oh YOOR a sil; dye KLOE fen ak) is a combination of a topical chemotherapy agent and non-steroidal anti-inflammatory drug (NSAID). It is used on the skin to treat skin cancer and skin conditions that could become cancer. This medicine may be used for other purposes; ask your health care provider or pharmacist if you have questions. COMMON BRAND NAME(S): FLUORAC What should I tell my health care provider before I take this medicine? They need to know if you have any of these conditions: -bleeding problems -cigarette smoker -DPD enzyme deficiency -heart  disease -high blood pressure -if you frequently drink alcohol containing drinks -kidney disease -liver disease -open or infected skin -stomach problems -swelling or open sores at the treatment site -recent or planned coronary artery bypass graft (CABG) surgery -an unusual or allergic reaction to fluorouracil, diclofenac, aspirin, other NSAIDs, other medicines, foods, dyes, or preservatives -pregnant or trying to get pregnant -breast-feeding How should I use this medicine? This  medicine is only for use on the skin. Follow the directions on the prescription label. Wash hands before and after use. Wash affected area and gently pat dry. To apply this medicine use a cotton-tipped applicator, or use gloves if applying with fingertips. If applied with unprotected fingertips, it is very important to wash your hands well after you apply this medicine. Avoid applying to the eyes, nose, or mouth. Apply enough medicine to cover the affected area. You can cover the area with a light gauze dressing, but do not use tight or air-tight dressings. Finish the full course prescribed by your doctor or health care professional, even if you think your condition is better. Do not stop taking except on the advice of your doctor or health care professional. Talk to your pediatrician regarding the use of this medicine in children. Special care may be needed. Overdosage: If you think you have taken too much of this medicine contact a poison control center or emergency room at once. NOTE: This medicine is only for you. Do not share this medicine with others. What if I miss a dose? If you miss a dose, apply it as soon as you can. If it is almost time for your next dose, only use that dose. Do not apply extra doses. Contact your doctor or health care professional if you miss more than one dose. What may interact with this medicine? Interactions are not expected. Do not use any other skin products without telling your doctor or health care professional. This list may not describe all possible interactions. Give your health care provider a list of all the medicines, herbs, non-prescription drugs, or dietary supplements you use. Also tell them if you smoke, drink alcohol, or use illegal drugs. Some items may interact with your medicine. What should I watch for while using this medicine? Visit your doctor or health care professional for checks on your progress. You will need to use this medicine for 2 to 6 weeks.  This may be longer depending on the condition being treated. You may not see full healing for another 1 to 2 months after you stop using the medicine. Treated areas of skin can look unsightly during and for several weeks after treatment with this medicine. This medicine can make you more sensitive to the sun. Keep out of the sun. If you cannot avoid being in the sun, wear protective clothing and use sunscreen. Do not use sun lamps or tanning beds/booths. If a pet comes in contact with the area where this medicine was applied to your skin or if it is ingested, they may have a serious risk of side effects. If accidental contact happens, the skin of the pet should be washed right away with soap and water. Contact your vet right away if your pet becomes exposed. Do not become pregnant while taking this medicine. Women should inform their doctor if they wish to become pregnant or think they might be pregnant. There is a potential for serious side effects to an unborn child. Talk to your health care professional or pharmacist for more information. What side  effects may I notice from receiving this medicine? Side effects that you should report to your doctor or health care professional as soon as possible: -allergic reactions like skin rash, itching or hives, swelling of the face, lips, or tongue -black or bloody stools, blood in the urine or vomit -blurred vision -chest pain -difficulty breathing or wheezing -redness, blistering, peeling or loosening of the skin, including inside the mouth -severe redness and swelling of normal skin -slurred speech or weakness on one side of the body -trouble passing urine or change in the amount of urine -unexplained weight gain or swelling -unusually weak or tired -yellowing of eyes or skin Side effects that usually do not require medical attention (report to your doctor or health care professional if they continue or are bothersome): -increased sensitivity of the  skin to sun and ultraviolet light -pain and burning of the affected area -scaling or swelling of the affected area -skin rash, itching of the affected area -tenderness This list may not describe all possible side effects. Call your doctor for medical advice about side effects. You may report side effects to FDA at 1-800-FDA-1088. Where should I keep my medicine? Keep out of the reach of children and pets. Store at room temperature between 20 and 25 degrees C (68 and 77 degrees F). Throw away any unused medicine after the expiration date. NOTE: This sheet is a summary. It may not cover all possible information. If you have questions about this medicine, talk to your doctor, pharmacist, or health care provider.  2018 Elsevier/Gold Standard (2015-09-24 17:35:08) Panitumumab Solution for Injection What is this medicine? PANITUMUMAB (pan i TOOM ue mab) is a monoclonal antibody. It is used to treat colorectal cancer. This medicine may be used for other purposes; ask your health care provider or pharmacist if you have questions. COMMON BRAND NAME(S): Vectibix What should I tell my health care provider before I take this medicine? They need to know if you have any of these conditions: -eye disease, vision problems -low levels of calcium, magnesium, or potassium in the blood -lung or breathing disease, like asthma -skin conditions or sensitivity -an unusual or allergic reaction to panitumumab, other medicines, foods, dyes, or preservatives -pregnant or trying to get pregnant -breast-feeding How should I use this medicine? This drug is given as an infusion into a vein. It is administered in a hospital or clinic by a specially trained health care professional. Talk to your pediatrician regarding the use of this medicine in children. Special care may be needed. Overdosage: If you think you have taken too much of this medicine contact a poison control center or emergency room at once. NOTE: This  medicine is only for you. Do not share this medicine with others. What if I miss a dose? It is important not to miss your dose. Call your doctor or health care professional if you are unable to keep an appointment. What may interact with this medicine? Do not take this medicine with any of the following medications: -bevacizumab This list may not describe all possible interactions. Give your health care provider a list of all the medicines, herbs, non-prescription drugs, or dietary supplements you use. Also tell them if you smoke, drink alcohol, or use illegal drugs. Some items may interact with your medicine. What should I watch for while using this medicine? Visit your doctor for checks on your progress. This drug may make you feel generally unwell. This is not uncommon, as chemotherapy can affect healthy cells as well as  cancer cells. Report any side effects. Continue your course of treatment even though you feel ill unless your doctor tells you to stop. This medicine can make you more sensitive to the sun. Keep out of the sun while receiving this medicine and for 2 months after the last dose. If you cannot avoid being in the sun, wear protective clothing and use sunscreen. Do not use sun lamps or tanning beds/booths. In some cases, you may be given additional medicines to help with side effects. Follow all directions for their use. Call your doctor or health care professional for advice if you get a fever, chills or sore throat, or other symptoms of a cold or flu. Do not treat yourself. This drug decreases your body's ability to fight infections. Try to avoid being around people who are sick. Avoid taking products that contain aspirin, acetaminophen, ibuprofen, naproxen, or ketoprofen unless instructed by your doctor. These medicines may hide a fever. Do not become pregnant while taking this medicine and for 2 months after the last dose. Women should inform their doctor if they wish to become  pregnant or think they might be pregnant. There is a potential for serious side effects to an unborn child. Talk to your health care professional or pharmacist for more information. Do not breast-feed an infant while taking this medicine or for 2 months after the last dose. What side effects may I notice from receiving this medicine? Side effects that you should report to your doctor or health care professional as soon as possible: -allergic reactions like skin rash, itching or hives, swelling of the face, lips, or tongue -breathing problems -changes in vision -eye pain -fast, irregular heartbeat -fever, chills -mouth sores -red spots on the skin -redness, blistering, peeling or loosening of the skin, including inside the mouth -signs and symptoms of kidney injury like trouble passing urine or change in the amount of urine -signs and symptoms of low blood pressure like dizziness; feeling faint or lightheaded, falls; unusually weak or tired -signs of low calcium like fast heartbeat, muscle cramps or muscle pain; pain, tingling, numbness in the hands or feet; seizures -signs and symptoms of low magnesium like muscle cramps, pain, or weakness; tremors; seizures; or fast, irregular heartbeat -signs and symptoms of low potassium like muscle cramps or muscle pain; chest pain; dizziness; feeling faint or lightheaded, falls; palpitations; breathing problems; or fast, irregular heartbeat -swelling of the ankles, feet, hands Side effects that usually do not require medical attention (report to your doctor or health care professional if they continue or are bothersome): -changes in skin like acne, cracks, skin dryness -diarrhea -eyelash growth -headache -mouth sores -nail changes -nausea, vomiting This list may not describe all possible side effects. Call your doctor for medical advice about side effects. You may report side effects to FDA at 1-800-FDA-1088. Where should I keep my medicine? This  drug is given in a hospital or clinic and will not be stored at home. NOTE: This sheet is a summary. It may not cover all possible information. If you have questions about this medicine, talk to your doctor, pharmacist, or health care provider.  2018 Elsevier/Gold Standard (2016-03-03 16:45:04)

## 2018-01-17 NOTE — Progress Notes (Signed)
[facility]  Clinical Social Work was referred by patient  to review and complete healthcare advance directives.  Clinical Social Worker met with patient, chaplain and witnesses in infusion room.  The patient designated Tanda Rockers as their primary healthcare agent and Arville Lime as their secondary agent.  Patient also completed healthcare living will.    Chaplain Mat Stalnaker notarized documents and made copies for patient/family. Clinical Social Worker will send documents to medical records to be scanned into patient's chart. Clinical Social Worker encouraged patient/family to contact with any additional questions or concerns.  Edwyna Shell, LCSW Clinical Social Worker Phone:  [phone]

## 2018-01-17 NOTE — Progress Notes (Signed)
Patient refuses dexamethasone at this time due to preference.

## 2018-01-17 NOTE — Patient Instructions (Signed)

## 2018-01-18 ENCOUNTER — Ambulatory Visit
Admission: RE | Admit: 2018-01-18 | Discharge: 2018-01-18 | Disposition: A | Discharge status: Home / Self Care | Payer: BLUE CROSS/BLUE SHIELD | Source: Ambulatory Visit | Attending: Radiation Oncology | Admitting: Radiation Oncology

## 2018-01-18 DIAGNOSIS — C20 Malignant neoplasm of rectum: Secondary | ICD-10-CM | POA: Diagnosis not present

## 2018-01-18 MED ORDER — OXYCODONE-ACETAMINOPHEN 5-325 MG PO TABS: 1.0000 | ORAL_TABLET | Freq: Four times a day (QID) | ORAL | 0 refills | Status: DC | PRN

## 2018-01-19 ENCOUNTER — Inpatient Hospital Stay: Payer: BLUE CROSS/BLUE SHIELD

## 2018-01-19 DIAGNOSIS — C20 Malignant neoplasm of rectum: Secondary | ICD-10-CM

## 2018-01-19 MED ORDER — HEPARIN SOD (PORK) LOCK FLUSH 100 UNIT/ML IV SOLN
500.0000 [IU] | Freq: Once | INTRAVENOUS | Status: AC | PRN
  Administered 2018-01-19: 500 [IU]
  Filled 2018-01-19: qty 5

## 2018-01-19 MED ORDER — SODIUM CHLORIDE 0.9% FLUSH
10.0000 mL | INTRAVENOUS | Status: DC | PRN
  Administered 2018-01-19: 10 mL
  Filled 2018-01-19: qty 10

## 2018-01-19 NOTE — Progress Notes (Signed)
Pt reports that he does not want his injection b/c Dr Burr Medico said that his [facility] was OK & he didn't have to get inj.

## 2018-01-22 ENCOUNTER — Ambulatory Visit
Admission: RE | Admit: 2018-01-22 | Discharge: 2018-01-22 | Disposition: A | Discharge status: Home / Self Care | Payer: BLUE CROSS/BLUE SHIELD | Source: Ambulatory Visit | Attending: Radiation Oncology | Admitting: Radiation Oncology

## 2018-01-22 DIAGNOSIS — C20 Malignant neoplasm of rectum: Secondary | ICD-10-CM | POA: Diagnosis not present

## 2018-01-23 ENCOUNTER — Ambulatory Visit
Admission: RE | Admit: 2018-01-23 | Discharge: 2018-01-23 | Disposition: A | Discharge status: Home / Self Care | Payer: BLUE CROSS/BLUE SHIELD | Source: Ambulatory Visit | Attending: Radiation Oncology | Admitting: Radiation Oncology

## 2018-01-23 DIAGNOSIS — C20 Malignant neoplasm of rectum: Secondary | ICD-10-CM | POA: Diagnosis not present

## 2018-01-24 ENCOUNTER — Ambulatory Visit: Payer: BLUE CROSS/BLUE SHIELD

## 2018-01-24 ENCOUNTER — Encounter: Payer: Self-pay | Admitting: Radiation Oncology

## 2018-01-24 ENCOUNTER — Ambulatory Visit
Admission: RE | Admit: 2018-01-24 | Discharge: 2018-01-24 | Disposition: A | Discharge status: Home / Self Care | Payer: BLUE CROSS/BLUE SHIELD | Source: Ambulatory Visit | Attending: Radiation Oncology | Admitting: Radiation Oncology

## 2018-01-24 DIAGNOSIS — C20 Malignant neoplasm of rectum: Secondary | ICD-10-CM | POA: Diagnosis not present

## 2018-01-24 NOTE — Progress Notes (Addendum)
  Radiation Oncology         [phone] ________________________________  Name: David Clarke MRN: [id number]  Date: 01/03/2018  DOB: 1956-06-28  SIMULATION AND TREATMENT PLANNING NOTE  DIAGNOSIS:     ICD-10-CM   1. 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 Malignant neoplasm of rectum ([facility]) C20 CT CHEST W CONTRAST    CT Abdomen Pelvis W Contrast     Site:  abdomen  NARRATIVE:  The patient was brought to the [facility].  Identity was confirmed.  All relevant records and images related to the planned course of therapy were reviewed.   Written consent to proceed with treatment was confirmed which was freely given after reviewing the details related to the planned course of therapy had been reviewed with the patient.  Then, the patient was set-up in a stable reproducible  supine position for radiation therapy.  CT images were obtained.  Surface markings were placed.    Medically necessary complex treatment device(s) for immobilization: Customized Vac-Lok bag.   The CT images were loaded into the planning software.  Then the target and avoidance structures were contoured.  Treatment planning then occurred.  The radiation prescription was entered and confirmed.  The patient will complete treatment on our tomotherapy unit.  I have requested : Intensity Modulated Radiotherapy (IMRT) is medically necessary for this case for the following reason: Previous treatment to the pelvis and upper abdomen with overlap in the previous treatment and the current target region.  This will require a dose accumulation which will be labor-intensive to analyze and utilize for planning purposes.  This therefore represents a course of reirradiation.   The patient will undergo daily image guidance to ensure accurate localization of the target, and adequate minimize dose to the normal surrounding structures in close proximity to the target.   PLAN:  The patient will receive 28 Gy in 8  fractions.    Special treatment procedure The patient has previously received radiation treatment to an area that overlaps with the current target region.  This therefore represents a course of reirradiation and the previous treatment will be accounted for for the current planning.  This represents therefore a special treatment procedure.   ________________________________   Jodelle Gross, MD, PhD

## 2018-01-25 ENCOUNTER — Telehealth: Payer: Self-pay | Admitting: Hematology

## 2018-01-25 ENCOUNTER — Ambulatory Visit: Payer: BLUE CROSS/BLUE SHIELD

## 2018-01-25 NOTE — Telephone Encounter (Signed)
Spoke with patient regarding appointments he will get schedule from mychart per 5/23 los

## 2018-01-28 ENCOUNTER — Ambulatory Visit: Payer: BLUE CROSS/BLUE SHIELD

## 2018-01-28 NOTE — Progress Notes (Signed)
  Radiation Oncology         [phone] ________________________________  Name: David Clarke MRN: [id number]  Date: 01/24/2018  DOB: 1956/01/21  End of Treatment Note  Diagnosis:   62 y.o. 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  He did experience increased fatigue and some weight loss over the course of treatment due to decreased appetite. He denied any issues with his bowels or bladder. No other complaints or concerns voiced.  Plan: The patient has completed radiation treatment. The patient will return to radiation oncology clinic for routine followup in one month. I advised them to call or return sooner if they have any questions or concerns related to their recovery or treatment.  ------------------------------------------------  Jodelle Gross, MD, PhD  This document serves as a record of services personally performed by Kyung Rudd, MD. It was created on his behalf by Rae Lips, a trained medical scribe. The creation of this record is based on the scribe's personal observations and the provider's statements to them. This document has been checked and approved by the attending provider.

## 2018-01-29 ENCOUNTER — Ambulatory Visit: Payer: BLUE CROSS/BLUE SHIELD

## 2018-01-30 ENCOUNTER — Ambulatory Visit: Payer: BLUE CROSS/BLUE SHIELD

## 2018-01-31 ENCOUNTER — Ambulatory Visit: Payer: BLUE CROSS/BLUE SHIELD

## 2018-01-31 ENCOUNTER — Encounter: Payer: Self-pay | Admitting: Hematology

## 2018-01-31 ENCOUNTER — Inpatient Hospital Stay (HOSPITAL_BASED_OUTPATIENT_CLINIC_OR_DEPARTMENT_OTHER): Payer: BLUE CROSS/BLUE SHIELD | Admitting: Hematology

## 2018-01-31 ENCOUNTER — Inpatient Hospital Stay: Payer: BLUE CROSS/BLUE SHIELD

## 2018-01-31 ENCOUNTER — Telehealth: Payer: Self-pay

## 2018-01-31 ENCOUNTER — Inpatient Hospital Stay: Payer: BLUE CROSS/BLUE SHIELD | Attending: Hematology

## 2018-01-31 VITALS — BP 100/81 | HR 87 | Temp 97.6°F | Resp 18 | Ht 72.0 in | Wt 134.4 lb

## 2018-01-31 DIAGNOSIS — C7800 Secondary malignant neoplasm of unspecified lung: Secondary | ICD-10-CM | POA: Insufficient documentation

## 2018-01-31 DIAGNOSIS — Z5111 Encounter for antineoplastic chemotherapy: Secondary | ICD-10-CM | POA: Diagnosis present

## 2018-01-31 DIAGNOSIS — C7989 Secondary malignant neoplasm of other specified sites: Secondary | ICD-10-CM

## 2018-01-31 DIAGNOSIS — G620 Drug-induced polyneuropathy: Secondary | ICD-10-CM | POA: Diagnosis not present

## 2018-01-31 DIAGNOSIS — T451X5A Adverse effect of antineoplastic and immunosuppressive drugs, initial encounter: Secondary | ICD-10-CM

## 2018-01-31 DIAGNOSIS — C20 Malignant neoplasm of rectum: Secondary | ICD-10-CM

## 2018-01-31 DIAGNOSIS — M533 Sacrococcygeal disorders, not elsewhere classified: Secondary | ICD-10-CM | POA: Diagnosis not present

## 2018-01-31 DIAGNOSIS — M79604 Pain in right leg: Secondary | ICD-10-CM

## 2018-01-31 DIAGNOSIS — Z87891 Personal history of nicotine dependence: Secondary | ICD-10-CM

## 2018-01-31 DIAGNOSIS — D500 Iron deficiency anemia secondary to blood loss (chronic): Secondary | ICD-10-CM

## 2018-01-31 DIAGNOSIS — C770 Secondary and unspecified malignant neoplasm of lymph nodes of head, face and neck: Secondary | ICD-10-CM | POA: Diagnosis not present

## 2018-01-31 DIAGNOSIS — R21 Rash and other nonspecific skin eruption: Secondary | ICD-10-CM

## 2018-01-31 DIAGNOSIS — R200 Anesthesia of skin: Secondary | ICD-10-CM | POA: Diagnosis not present

## 2018-01-31 DIAGNOSIS — Z5189 Encounter for other specified aftercare: Secondary | ICD-10-CM | POA: Insufficient documentation

## 2018-01-31 DIAGNOSIS — Z5112 Encounter for antineoplastic immunotherapy: Secondary | ICD-10-CM | POA: Insufficient documentation

## 2018-01-31 DIAGNOSIS — C787 Secondary malignant neoplasm of liver and intrahepatic bile duct: Secondary | ICD-10-CM | POA: Insufficient documentation

## 2018-01-31 DIAGNOSIS — K5900 Constipation, unspecified: Secondary | ICD-10-CM | POA: Diagnosis not present

## 2018-01-31 LAB — COMPREHENSIVE METABOLIC PANEL
ALBUMIN: 3.1 g/dL — AB (ref 3.5–5.0)
ALT: 6 U/L (ref 0–55)
ANION GAP: 9 (ref 3–11)
AST: 18 U/L (ref 5–34)
Alkaline Phosphatase: 121 U/L (ref 40–150)
BUN: 13 mg/dL (ref 7–26)
CHLORIDE: 101 mmol/L (ref 98–109)
CO2: 25 mmol/L (ref 22–29)
CREATININE: 0.68 mg/dL — AB (ref 0.70–1.30)
Calcium: 8.7 mg/dL (ref 8.4–10.4)
GFR calc non Af Amer: >60 mL/min (ref >60)
GLUCOSE: 104 mg/dL (ref 70–140)
Potassium: 3.5 mmol/L (ref 3.5–5.1)
SODIUM: 135 mmol/L — AB (ref 136–145)
Total Bilirubin: 0.5 mg/dL (ref 0.2–1.2)
Total Protein: 6.4 g/dL (ref 6.4–8.3)

## 2018-01-31 LAB — CBC WITH DIFFERENTIAL/PLATELET
Basophils Absolute: 0 10*3/uL (ref 0.0–0.1)
Basophils Relative: 1 %
Eosinophils Absolute: 0.1 10*3/uL (ref 0.0–0.5)
Eosinophils Relative: 3 %
HEMATOCRIT: 31.1 % — AB (ref 38.4–49.9)
Hemoglobin: 10.6 g/dL — ABNORMAL LOW (ref 13.0–17.1)
LYMPHS ABS: 0.3 10*3/uL — AB (ref 0.9–3.3)
Lymphocytes Relative: 6 %
MCH: 33.6 pg — AB (ref 27.2–33.4)
MCHC: 34 g/dL (ref 32.0–36.0)
MCV: 99 fL — ABNORMAL HIGH (ref 79.3–98.0)
MONO ABS: 0.7 10*3/uL (ref 0.1–0.9)
MONOS PCT: 15 %
NEUTROS ABS: 3.4 10*3/uL (ref 1.5–6.5)
Neutrophils Relative %: 75 %
Platelets: 301 10*3/uL (ref 140–400)
RBC: 3.14 MIL/uL — ABNORMAL LOW (ref 4.20–5.82)
RDW: 17.3 % — ABNORMAL HIGH (ref 11.0–14.6)
WBC: 4.4 10*3/uL (ref 4.0–10.3)

## 2018-01-31 LAB — MAGNESIUM: MAGNESIUM: 1.7 mg/dL (ref 1.7–2.4)

## 2018-01-31 MED ORDER — SODIUM CHLORIDE 0.9% FLUSH
3.0000 mL | Freq: Once | INTRAVENOUS | Status: AC | PRN
  Administered 2018-01-31: 10 mL via INTRAVENOUS
  Filled 2018-01-31: qty 10

## 2018-01-31 MED ORDER — LEUCOVORIN CALCIUM INJECTION 350 MG
400.0000 mg/m2 | Freq: Once | INTRAVENOUS | Status: AC
  Administered 2018-01-31: 756 mg via INTRAVENOUS
  Filled 2018-01-31: qty 37.8

## 2018-01-31 MED ORDER — ATROPINE SULFATE 1 MG/ML IJ SOLN
INTRAMUSCULAR | Status: AC
  Filled 2018-01-31: qty 1

## 2018-01-31 MED ORDER — ATROPINE SULFATE 1 MG/ML IJ SOLN: 0.5000 mg | Freq: Once | INTRAMUSCULAR | Status: DC | PRN

## 2018-01-31 MED ORDER — IRINOTECAN HCL CHEMO INJECTION 100 MG/5ML
150.0000 mg/m2 | Freq: Once | INTRAVENOUS | Status: AC
  Administered 2018-01-31: 280 mg via INTRAVENOUS
  Filled 2018-01-31: qty 14

## 2018-01-31 MED ORDER — PALONOSETRON HCL INJECTION 0.25 MG/5ML
0.2500 mg | Freq: Once | INTRAVENOUS | Status: AC
  Administered 2018-01-31: 0.25 mg via INTRAVENOUS

## 2018-01-31 MED ORDER — DEXTROSE 5 % IV SOLN
Freq: Once | INTRAVENOUS | Status: AC
  Administered 2018-01-31: 15:00:00 via INTRAVENOUS

## 2018-01-31 MED ORDER — SODIUM CHLORIDE 0.9 % IV SOLN: Freq: Once | INTRAVENOUS | Status: DC

## 2018-01-31 MED ORDER — PALONOSETRON HCL INJECTION 0.25 MG/5ML
INTRAVENOUS | Status: AC
  Filled 2018-01-31: qty 5

## 2018-01-31 MED ORDER — SODIUM CHLORIDE 0.9 % IV SOLN
6.0000 mg/kg | Freq: Once | INTRAVENOUS | Status: AC
  Administered 2018-01-31: 400 mg via INTRAVENOUS
  Filled 2018-01-31: qty 20

## 2018-01-31 MED ORDER — SODIUM CHLORIDE 0.9 % IV SOLN
2200.0000 mg/m2 | INTRAVENOUS | Status: DC
  Administered 2018-01-31: 4150 mg via INTRAVENOUS
  Filled 2018-01-31: qty 83

## 2018-01-31 MED ORDER — SODIUM CHLORIDE 0.9 % IV SOLN
Freq: Once | INTRAVENOUS | Status: AC
  Administered 2018-01-31: 13:00:00 via INTRAVENOUS

## 2018-01-31 MED ORDER — OXALIPLATIN CHEMO INJECTION 100 MG/20ML
70.0000 mg/m2 | Freq: Once | INTRAVENOUS | Status: AC
  Administered 2018-01-31: 130 mg via INTRAVENOUS
  Filled 2018-01-31: qty 20

## 2018-01-31 MED ORDER — GABAPENTIN 100 MG PO CAPS: 100.0000 mg | ORAL_CAPSULE | Freq: Every day | ORAL | 0 refills | Status: DC

## 2018-01-31 MED ORDER — FOSAPREPITANT DIMEGLUMINE INJECTION 150 MG
Freq: Once | INTRAVENOUS | Status: AC
  Administered 2018-01-31: 13:00:00 via INTRAVENOUS
  Filled 2018-01-31: qty 5

## 2018-01-31 NOTE — Telephone Encounter (Signed)
I have reviewed and agree with the assessment.   Demetrius Charity, PharmD PGY2 Oncology Pharmacy Resident  Pharmacy Phone: [phone] 01/31/2018

## 2018-01-31 NOTE — Patient Instructions (Signed)
[place] Discharge Instructions for Patients Receiving Chemotherapy  Today you received the following chemotherapy agents Leucovorin, Irinotecan, 5FU, Vectibix  To help prevent nausea and vomiting after your treatment, we encourage you to take your nausea medication as prescribed.   If you develop nausea and vomiting that is not controlled by your nausea medication, call the clinic.   BELOW ARE SYMPTOMS THAT SHOULD BE REPORTED IMMEDIATELY:  *FEVER GREATER THAN 100.5 F  *CHILLS WITH OR WITHOUT FEVER  NAUSEA AND VOMITING THAT IS NOT CONTROLLED WITH YOUR NAUSEA MEDICATION  *UNUSUAL SHORTNESS OF BREATH  *UNUSUAL BRUISING OR BLEEDING  TENDERNESS IN MOUTH AND THROAT WITH OR WITHOUT PRESENCE OF ULCERS  *URINARY PROBLEMS  *BOWEL PROBLEMS  UNUSUAL RASH Items with * indicate a potential emergency and should be followed up as soon as possible.  Feel free to call the clinic should you have any questions or concerns. The clinic phone number is [phone].  Please show the [place] at check-in to the Emergency Department and triage nurse.   Leucovorin injection What is this medicine? LEUCOVORIN (loo koe VOR in) is used to prevent or treat the harmful effects of some medicines. This medicine is used to treat anemia caused by a low amount of folic acid in the body. It is also used with 5-fluorouracil (5-FU) to treat colon cancer. This medicine may be used for other purposes; ask your health care provider or pharmacist if you have questions. What should I tell my health care provider before I take this medicine? They need to know if you have any of these conditions: -anemia from low levels of vitamin B-12 in the blood -an unusual or allergic reaction to leucovorin, folic acid, other medicines, foods, dyes, or preservatives -pregnant or trying to get pregnant -breast-feeding How should I use this medicine? This medicine is for injection into a muscle or into a  vein. It is given by a health care professional in a hospital or clinic setting. Talk to your pediatrician regarding the use of this medicine in children. Special care may be needed. Overdosage: If you think you have taken too much of this medicine contact a poison control center or emergency room at once. NOTE: This medicine is only for you. Do not share this medicine with others. What if I miss a dose? This does not apply. What may interact with this medicine? -capecitabine -fluorouracil -phenobarbital -phenytoin -primidone -trimethoprim-sulfamethoxazole This list may not describe all possible interactions. Give your health care provider a list of all the medicines, herbs, non-prescription drugs, or dietary supplements you use. Also tell them if you smoke, drink alcohol, or use illegal drugs. Some items may interact with your medicine. What should I watch for while using this medicine? Your condition will be monitored carefully while you are receiving this medicine. This medicine may increase the side effects of 5-fluorouracil, 5-FU. Tell your doctor or health care professional if you have diarrhea or mouth sores that do not get better or that get worse. What side effects may I notice from receiving this medicine? Side effects that you should report to your doctor or health care professional as soon as possible: -allergic reactions like skin rash, itching or hives, swelling of the face, lips, or tongue -breathing problems -fever, infection -mouth sores -unusual bleeding or bruising -unusually weak or tired Side effects that usually do not require medical attention (report to your doctor or health care professional if they continue or are bothersome): -constipation or diarrhea -loss of  appetite -nausea, vomiting This list may not describe all possible side effects. Call your doctor for medical advice about side effects. You may report side effects to FDA at 1-800-FDA-1088. Where should  I keep my medicine? This drug is given in a hospital or clinic and will not be stored at home. NOTE: This sheet is a summary. It may not cover all possible information. If you have questions about this medicine, talk to your doctor, pharmacist, or health care provider.  2018 Elsevier/Gold Standard (2008-02-18 16:50:29)   Irinotecan injection What is this medicine? IRINOTECAN (ir in oh TEE kan ) is a chemotherapy drug. It is used to treat colon and rectal cancer. This medicine may be used for other purposes; ask your health care provider or pharmacist if you have questions. COMMON BRAND NAME(S): Camptosar What should I tell my health care provider before I take this medicine? They need to know if you have any of these conditions: -blood disorders -dehydration -diarrhea -infection (especially a virus infection such as chickenpox, cold sores, or herpes) -liver disease -low blood counts, like low white cell, platelet, or red cell counts -recent or ongoing radiation therapy -an unusual or allergic reaction to irinotecan, sorbitol, other chemotherapy, other medicines, foods, dyes, or preservatives -pregnant or trying to get pregnant -breast-feeding How should I use this medicine? This drug is given as an infusion into a vein. It is administered in a hospital or clinic by a specially trained health care professional. Talk to your pediatrician regarding the use of this medicine in children. Special care may be needed. Overdosage: If you think you have taken too much of this medicine contact a poison control center or emergency room at once. NOTE: This medicine is only for you. Do not share this medicine with others. What if I miss a dose? It is important not to miss your dose. Call your doctor or health care professional if you are unable to keep an appointment. What may interact with this medicine? Do not take this medicine with any of the following medications: -atazanavir -certain  medicines for fungal infections like itraconazole and ketoconazole -St. [place]'s Wort This medicine may also interact with the following medications: -dexamethasone -diuretics -laxatives -medicines for seizures like carbamazepine, mephobarbital, phenobarbital, phenytoin, primidone -medicines to increase blood counts like filgrastim, pegfilgrastim, sargramostim -prochlorperazine -vaccines This list may not describe all possible interactions. Give your health care provider a list of all the medicines, herbs, non-prescription drugs, or dietary supplements you use. Also tell them if you smoke, drink alcohol, or use illegal drugs. Some items may interact with your medicine. What should I watch for while using this medicine? Your condition will be monitored carefully while you are receiving this medicine. You will need important blood work done while you are taking this medicine. This drug may make you feel generally unwell. This is not uncommon, as chemotherapy can affect healthy cells as well as cancer cells. Report any side effects. Continue your course of treatment even though you feel ill unless your doctor tells you to stop. In some cases, you may be given additional medicines to help with side effects. Follow all directions for their use. You may get drowsy or dizzy. Do not drive, use machinery, or do anything that needs mental alertness until you know how this medicine affects you. Do not stand or sit up quickly, especially if you are an older patient. This reduces the risk of dizzy or fainting spells. Call your doctor or health care professional for advice if  you get a fever, chills or sore throat, or other symptoms of a cold or flu. Do not treat yourself. This drug decreases your body's ability to fight infections. Try to avoid being around people who are sick. This medicine may increase your risk to bruise or bleed. Call your doctor or health care professional if you notice any unusual  bleeding. Be careful brushing and flossing your teeth or using a toothpick because you may get an infection or bleed more easily. If you have any dental work done, tell your dentist you are receiving this medicine. Avoid taking products that contain aspirin, acetaminophen, ibuprofen, naproxen, or ketoprofen unless instructed by your doctor. These medicines may hide a fever. Do not become pregnant while taking this medicine. Women should inform their doctor if they wish to become pregnant or think they might be pregnant. There is a potential for serious side effects to an unborn child. Talk to your health care professional or pharmacist for more information. Do not breast-feed an infant while taking this medicine. What side effects may I notice from receiving this medicine? Side effects that you should report to your doctor or health care professional as soon as possible: -allergic reactions like skin rash, itching or hives, swelling of the face, lips, or tongue -low blood counts - this medicine may decrease the number of white blood cells, red blood cells and platelets. You may be at increased risk for infections and bleeding. -signs of infection - fever or chills, cough, sore throat, pain or difficulty passing urine -signs of decreased platelets or bleeding - bruising, pinpoint red spots on the skin, black, tarry stools, blood in the urine -signs of decreased red blood cells - unusually weak or tired, fainting spells, lightheadedness -breathing problems -chest pain -diarrhea -feeling faint or lightheaded, falls -flushing, runny nose, sweating during infusion -mouth sores or pain -pain, swelling, redness or irritation where injected -pain, swelling, warmth in the leg -pain, tingling, numbness in the hands or feet -problems with balance, talking, walking -stomach cramps, pain -trouble passing urine or change in the amount of urine -vomiting as to be unable to hold down drinks or  food -yellowing of the eyes or skin Side effects that usually do not require medical attention (report to your doctor or health care professional if they continue or are bothersome): -constipation -hair loss -headache -loss of appetite -nausea, vomiting -stomach upset This list may not describe all possible side effects. Call your doctor for medical advice about side effects. You may report side effects to FDA at 1-800-FDA-1088. Where should I keep my medicine? This drug is given in a hospital or clinic and will not be stored at home. NOTE: This sheet is a summary. It may not cover all possible information. If you have questions about this medicine, talk to your doctor, pharmacist, or health care provider.  2018 Elsevier/Gold Standard (2013-02-10 16:29:32)   Fluorouracil, 5FU; Diclofenac topical cream What is this medicine? FLUOROURACIL; DICLOFENAC (flure oh YOOR a sil; dye KLOE fen ak) is a combination of a topical chemotherapy agent and non-steroidal anti-inflammatory drug (NSAID). It is used on the skin to treat skin cancer and skin conditions that could become cancer. This medicine may be used for other purposes; ask your health care provider or pharmacist if you have questions. COMMON BRAND NAME(S): FLUORAC What should I tell my health care provider before I take this medicine? They need to know if you have any of these conditions: -bleeding problems -cigarette smoker -DPD enzyme deficiency -heart  disease -high blood pressure -if you frequently drink alcohol containing drinks -kidney disease -liver disease -open or infected skin -stomach problems -swelling or open sores at the treatment site -recent or planned coronary artery bypass graft (CABG) surgery -an unusual or allergic reaction to fluorouracil, diclofenac, aspirin, other NSAIDs, other medicines, foods, dyes, or preservatives -pregnant or trying to get pregnant -breast-feeding How should I use this medicine? This  medicine is only for use on the skin. Follow the directions on the prescription label. Wash hands before and after use. Wash affected area and gently pat dry. To apply this medicine use a cotton-tipped applicator, or use gloves if applying with fingertips. If applied with unprotected fingertips, it is very important to wash your hands well after you apply this medicine. Avoid applying to the eyes, nose, or mouth. Apply enough medicine to cover the affected area. You can cover the area with a light gauze dressing, but do not use tight or air-tight dressings. Finish the full course prescribed by your doctor or health care professional, even if you think your condition is better. Do not stop taking except on the advice of your doctor or health care professional. Talk to your pediatrician regarding the use of this medicine in children. Special care may be needed. Overdosage: If you think you have taken too much of this medicine contact a poison control center or emergency room at once. NOTE: This medicine is only for you. Do not share this medicine with others. What if I miss a dose? If you miss a dose, apply it as soon as you can. If it is almost time for your next dose, only use that dose. Do not apply extra doses. Contact your doctor or health care professional if you miss more than one dose. What may interact with this medicine? Interactions are not expected. Do not use any other skin products without telling your doctor or health care professional. This list may not describe all possible interactions. Give your health care provider a list of all the medicines, herbs, non-prescription drugs, or dietary supplements you use. Also tell them if you smoke, drink alcohol, or use illegal drugs. Some items may interact with your medicine. What should I watch for while using this medicine? Visit your doctor or health care professional for checks on your progress. You will need to use this medicine for 2 to 6 weeks.  This may be longer depending on the condition being treated. You may not see full healing for another 1 to 2 months after you stop using the medicine. Treated areas of skin can look unsightly during and for several weeks after treatment with this medicine. This medicine can make you more sensitive to the sun. Keep out of the sun. If you cannot avoid being in the sun, wear protective clothing and use sunscreen. Do not use sun lamps or tanning beds/booths. If a pet comes in contact with the area where this medicine was applied to your skin or if it is ingested, they may have a serious risk of side effects. If accidental contact happens, the skin of the pet should be washed right away with soap and water. Contact your vet right away if your pet becomes exposed. Do not become pregnant while taking this medicine. Women should inform their doctor if they wish to become pregnant or think they might be pregnant. There is a potential for serious side effects to an unborn child. Talk to your health care professional or pharmacist for more information. What side  effects may I notice from receiving this medicine? Side effects that you should report to your doctor or health care professional as soon as possible: -allergic reactions like skin rash, itching or hives, swelling of the face, lips, or tongue -black or bloody stools, blood in the urine or vomit -blurred vision -chest pain -difficulty breathing or wheezing -redness, blistering, peeling or loosening of the skin, including inside the mouth -severe redness and swelling of normal skin -slurred speech or weakness on one side of the body -trouble passing urine or change in the amount of urine -unexplained weight gain or swelling -unusually weak or tired -yellowing of eyes or skin Side effects that usually do not require medical attention (report to your doctor or health care professional if they continue or are bothersome): -increased sensitivity of the  skin to sun and ultraviolet light -pain and burning of the affected area -scaling or swelling of the affected area -skin rash, itching of the affected area -tenderness This list may not describe all possible side effects. Call your doctor for medical advice about side effects. You may report side effects to FDA at 1-800-FDA-1088. Where should I keep my medicine? Keep out of the reach of children and pets. Store at room temperature between 20 and 25 degrees C (68 and 77 degrees F). Throw away any unused medicine after the expiration date. NOTE: This sheet is a summary. It may not cover all possible information. If you have questions about this medicine, talk to your doctor, pharmacist, or health care provider.  2018 Elsevier/Gold Standard (2015-09-24 17:35:08) Panitumumab Solution for Injection What is this medicine? PANITUMUMAB (pan i TOOM ue mab) is a monoclonal antibody. It is used to treat colorectal cancer. This medicine may be used for other purposes; ask your health care provider or pharmacist if you have questions. COMMON BRAND NAME(S): Vectibix What should I tell my health care provider before I take this medicine? They need to know if you have any of these conditions: -eye disease, vision problems -low levels of calcium, magnesium, or potassium in the blood -lung or breathing disease, like asthma -skin conditions or sensitivity -an unusual or allergic reaction to panitumumab, other medicines, foods, dyes, or preservatives -pregnant or trying to get pregnant -breast-feeding How should I use this medicine? This drug is given as an infusion into a vein. It is administered in a hospital or clinic by a specially trained health care professional. Talk to your pediatrician regarding the use of this medicine in children. Special care may be needed. Overdosage: If you think you have taken too much of this medicine contact a poison control center or emergency room at once. NOTE: This  medicine is only for you. Do not share this medicine with others. What if I miss a dose? It is important not to miss your dose. Call your doctor or health care professional if you are unable to keep an appointment. What may interact with this medicine? Do not take this medicine with any of the following medications: -bevacizumab This list may not describe all possible interactions. Give your health care provider a list of all the medicines, herbs, non-prescription drugs, or dietary supplements you use. Also tell them if you smoke, drink alcohol, or use illegal drugs. Some items may interact with your medicine. What should I watch for while using this medicine? Visit your doctor for checks on your progress. This drug may make you feel generally unwell. This is not uncommon, as chemotherapy can affect healthy cells as well as  cancer cells. Report any side effects. Continue your course of treatment even though you feel ill unless your doctor tells you to stop. This medicine can make you more sensitive to the sun. Keep out of the sun while receiving this medicine and for 2 months after the last dose. If you cannot avoid being in the sun, wear protective clothing and use sunscreen. Do not use sun lamps or tanning beds/booths. In some cases, you may be given additional medicines to help with side effects. Follow all directions for their use. Call your doctor or health care professional for advice if you get a fever, chills or sore throat, or other symptoms of a cold or flu. Do not treat yourself. This drug decreases your body's ability to fight infections. Try to avoid being around people who are sick. Avoid taking products that contain aspirin, acetaminophen, ibuprofen, naproxen, or ketoprofen unless instructed by your doctor. These medicines may hide a fever. Do not become pregnant while taking this medicine and for 2 months after the last dose. Women should inform their doctor if they wish to become  pregnant or think they might be pregnant. There is a potential for serious side effects to an unborn child. Talk to your health care professional or pharmacist for more information. Do not breast-feed an infant while taking this medicine or for 2 months after the last dose. What side effects may I notice from receiving this medicine? Side effects that you should report to your doctor or health care professional as soon as possible: -allergic reactions like skin rash, itching or hives, swelling of the face, lips, or tongue -breathing problems -changes in vision -eye pain -fast, irregular heartbeat -fever, chills -mouth sores -red spots on the skin -redness, blistering, peeling or loosening of the skin, including inside the mouth -signs and symptoms of kidney injury like trouble passing urine or change in the amount of urine -signs and symptoms of low blood pressure like dizziness; feeling faint or lightheaded, falls; unusually weak or tired -signs of low calcium like fast heartbeat, muscle cramps or muscle pain; pain, tingling, numbness in the hands or feet; seizures -signs and symptoms of low magnesium like muscle cramps, pain, or weakness; tremors; seizures; or fast, irregular heartbeat -signs and symptoms of low potassium like muscle cramps or muscle pain; chest pain; dizziness; feeling faint or lightheaded, falls; palpitations; breathing problems; or fast, irregular heartbeat -swelling of the ankles, feet, hands Side effects that usually do not require medical attention (report to your doctor or health care professional if they continue or are bothersome): -changes in skin like acne, cracks, skin dryness -diarrhea -eyelash growth -headache -mouth sores -nail changes -nausea, vomiting This list may not describe all possible side effects. Call your doctor for medical advice about side effects. You may report side effects to FDA at 1-800-FDA-1088. Where should I keep my medicine? This  drug is given in a hospital or clinic and will not be stored at home. NOTE: This sheet is a summary. It may not cover all possible information. If you have questions about this medicine, talk to your doctor, pharmacist, or health care provider.  2018 Elsevier/Gold Standard (2016-03-03 16:45:04)

## 2018-01-31 NOTE — Telephone Encounter (Signed)
Pharmacy Supplement Inquiry   David Clarke' oncologist requested pharmacy review a list of supplements for any interactions with his chemotherapy FOLFIRINOX. An additional check for interactions with his outpatient meds was also conducted.  Findings: David Clarke Tea Extract: May impair absorption of irinotecan. May increase levels and AUC of 5FU. Additive effects of hepatotoxicity with Percocet and Doxycyline.   Curcuma NF-kB Turmeric Supreme: May increase levels of dexamethasone, fosaprepitant, loperamide, Percocet, tramadol, and Zofran.   Pure Encapsulation Boswellia AKBA: May increase levels of dexamethasone, fosaprepitant, Percocet, tramadol, and Zofran.  KlairLabs Vitamin K2: No interactions found.   KlairLabs 10,000 IU Vitamin D3: May reduce levels of dexamethasone, fosaprepitant, tramadol, and Zofran.  Megadose Vitamin C: High doses (>3 g) slows elimination rate of Percocet. (Will not receive from [facility])    During David Clarke' infusion today, we recommended he avoid the green tea extract due to the interactions with FOLFIRINOX. David Clarke was in agreement that it was best to not take this supplement. He also stated that he was no longer taking dexamethasone, Percocet, or tramadol and has not needed Zofran thus far. Therefore, he is going to re-assess if the other supplements are needed if he were to start back on any of the outpatient meds. David Clarke agreed to notify us through MyChart about any supplements he decides to initiate.   Willia Craze, Pharmacy Student

## 2018-01-31 NOTE — Progress Notes (Signed)
[place]  [phone] [phone]  Clinic Follow Up Note   Patient Care Team: Robyn Haber, MD as PCP - General (Family Medicine) Michael Boston, MD as Consulting Physician (General Surgery) Pyrtle, Lajuan Lines, MD as Consulting Physician (Gastroenterology) Truitt Merle, MD as Consulting Physician (Hematology and Oncology) Kyung Rudd, MD as Consulting Physician (Radiation Oncology)   Date of Service:  01/31/2018  CHIEF COMPLAINTS:  Follow up rectal adenocarcinoma  Oncology History   Cancer Staging Rectal adenocarcinoma [facility]) Staging form: Colon and Rectum, AJCC 8th Edition - Clinical stage from 10/09/2016: Stage IIIB (cT3, cN1b, cM0) - Signed by Truitt Merle, MD on 10/25/2016 - Pathologic stage from 02/21/2017: Stage IIA (ypT3, pN0, cM0) - Signed by Truitt Merle, MD on 10/18/2017       Rectal adenocarcinoma ([place])   10/03/2016 - 10/03/2016 Hospital Admission    Admit date: 10/03/16 The patient presented to the [facility] ED with rectal bleeding and difficulty with hygiene.      10/09/2016 Initial Diagnosis    Rectal adenocarcinoma       10/09/2016 Procedure    Colonoscopy showed a fungating, infiltrative and ulcerated partially obstructing large mass in the distal rectum, measuring 9 cm, prolapsing at anal canal. Scope not advanced proximal to the rectosigmoid colon.      10/09/2016 Initial Biopsy    Rectal mass biopsy showed invasive adenocarcinoma.      10/13/2016 Imaging    CT Chest Abdomen Pelvis w contrast IMPRESSION: Large irregular enhancing rectal mass consistent with known rectal cancer. There are small perirectal and small pelvic sidewall lymph nodes which are suspicious. Prominent perirectal vessels are also noted. No evidence of metastatic retroperitoneal or sigmoid mesocolon adenopathy or hepatic or pulmonary metastasis.      10/24/2016 Imaging    MRI Pelvis IMPRESSION: 1. Moderately motion degraded exam. 2. Large mass is centered at  the anus with extension into the low rectum. No complicating obstruction. 3. Mild mesorectal adenopathy, suspicious. 4. Right inguinal hernia containing nonobstructive small bowel.       11/08/2016 - 12/19/2016 Chemotherapy    Xeloda 50 mg twice daily, with concurrent radiation, held since 12/03/2016 due to colitis and hospitalization      11/08/2016 - 12/19/2016 Radiation Therapy    Neoadjuvant radiation to his rectal cancer  1) Pelvis/ 45 Gy in 25 fractions  2) Rectum boost/ 9 Gy in 5 fractions Under the care of Dr. Lisbeth Renshaw.      12/03/2016 - 12/09/2016 Hospital Admission    He presented with few days to one week history of diarrhea with 6-7 loose stools daily, along with a fever of 101.5 on the day of admission. Patient was also noted to be hypokalemic, hyponatremic. Hospitalized for further management. Stool studies were sent. C. difficile was negative. GI pathogen panel was positive for Yersinia.  XELODA was held at this time, until infection has cleared.       12/07/2016 Imaging    CT A/P W Contrast  IMPRESSION: 1. Abnormal dilatation of the proximal small bowel loops worrisome for either small bowel obstruction versus small bowel ileus. 2. 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 3. Large right inguinal hernia containing edematous loop of distal small bowel 4. No pneumatosis or bowel perforation.  No abscess. 5. Aortic atherosclerosis 6. Mild decrease in size of rectal mass. No change in right common iliac adenopathy.      02/21/2017 Surgery  XI ROBOT ABDOMINOPERINEAL RESECTION WITH COLOSTOMY by Dr. Johney Maine      02/21/2017 Pathology Results    Diagnosis 02/21/17 1. Colon, segmental resection for tumor, rectosigmoid - INVASIVE ADENOCARCINOMA, MODERATELY DIFFERENTIATED, SPANNING 3.9 CM. - TUMOR INVADES THROUGH MUSCULARIS PROPRIA. - RESECTION MARGINS ARE NEGATIVE. - EIGHT OF  EIGHT LYMPH NODES NEGATIVE FOR CARCINOMA (0/8), SEE COMMENT. - SEE ONCOLOGY TABLE. 2. Soft tissue, biopsy, left lateral pelvic floor - BENIGN SKELETAL MUSCLE AND SOFT TISSUE. - NO MALIGNANCY IDENTIFIED. 3. Colon, segmental resection, proximal sigmoid - BENIGN COLONIC MUCOSA. - NO DYSPLASIA OR MALIGNANCY. 4. Liver, biopsy, liver mass - BENIGN VASCULAR PROLIFERATION CONSISTENT WITH HEMANGIOMA. - NO MALIGNANCY IDENTIFIED.      04/04/2017 - 08/12/2017 Chemotherapy    CAPOX every 3 weeks for 6 cycles. I decreased his first cycle of oxaliplatin platinum by 15%, he tolerated well, so we increased to full dose from cycle 2. He completed 5 cycles of oxaliplatin on 07/16/17 and completed Xeloda on 08/12/17.        08/15/2017 Procedure    Colonoscopy 08/15/17 IMPRESSION - Patent end colostomy with healthy appearing mucosa in the sigmoid colon. - The entire examined colon is normal. - No specimens collected.      09/18/2017 Imaging    CT CAP W Contrast 09/18/17 IMPRESSION: Interval abdomino-peritoneal resection with left lower quadrant colostomy. Increased diffuse cystitis, likely due to radiation. New presacral soft tissue density with central low attenuation, also suspicious for post treatment changes. Recommend continued attention on follow-up imaging. New mild retroperitoneal lymphadenopathy in the aortocaval space, measuring up to 1.8 cm. This is suspicious for metastatic disease, although it is conceivable that this could be reactive in etiology due to interval surgery and radiation therapy. Recommend short-term follow-up by CT in 3 months. New indeterminate 4 mm left upper lobe pulmonary nodule, likely inflammatory in etiology with metastatic disease considered less likely. Recommend continued attention on follow-up CT. Bilateral inguinal hernias, largest on the right containing several small bowel loops.      10/17/2017 PET scan    MPRESSION: Increased hypermetabolic  lymphadenopathy in abdominal retroperitoneum, right common iliac chain, and left supraclavicular region, consistent with metastatic disease. Stable tiny bilateral pulmonary nodules show no FDG uptake but are too small to characterize by PET. Recommend continued attention on follow-up CT. 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      10/29/2017 Relapse/Recurrence    Diagnosis Lymph node, needle/core biopsy, left supraclavicular - METASTATIC ADENOCARCINOMA, CONSISTENT WITH COLORECTAL PRIMARY. - SEE COMMENT.      11/20/2017 - 01/17/2018 Chemotherapy    FOLFIRI every 2 weeks, panitumumab was added from cycle 3.  FOLFIRI was held after cycle 3 due to disease progression, he continued with 5-FU and Panitumumab until further disease progression.        01/08/2018 Imaging    CT CAP with contrast IMPRESSION: 1. Today's study demonstrates dramatic progression of disease with interval development of widespread pulmonary metastases, multiple new liver lesions and progressive lymphadenopathy noted in the pelvis, retroperitoneum and left supraclavicular nodal stations. In addition, the previously noted metastatic lesion in the right psoas muscle has markedly increased in size. Whether or not this reflects growth of the lesion and/or internal hemorrhage of the lesion is uncertain. 2. Stable postoperative thickening and small postoperative fluid collection in the presacral space, as above, following abdominal perineal resection. 3. Large right inguinal hernia containing several loops of small bowel, without evidence of bowel incarceration or obstruction at this time.  01/14/2018 - 01/24/2018 Radiation Therapy    Radiation with Dr. Lisbeth Renshaw starting 01/14/18. Plan to complete on 01/24/18      01/31/2018 -  Chemotherapy    FOLFIRINOX plus Vectibix every 2 weeks starting 01/31/18      HISTORY OF PRESENTING ILLNESS (10/25/16):  David Clarke 62 y.o. male is here  because of his recently diagnosed rectal adenocarcinoma. He was referred by his gastroenterologist title parietal. Patient presents to my clinic, accompanied by his wife and his daughter.       He has noticed a anal mass accompanied by intermittent bleeding and pain in the rectum for the past 5 months. The patient initially presented to the [facility] ED on 10/03/16 with rectal bleeding and difficulty with hygiene. He was without health insurance previously and so did not pursue evaluation or treatment until this time.  The patient was seen by Dr. Hilarie Fredrickson for a colonoscopy and biopsy on 10/09/16, revealing a large partially obstructing low rectal mass, prolapsing to anal verge. Biopsy showed adenocarcinoma. CT C/A/P on 10/13/16 revealed a large, irregular, enhancing mass consistent with known rectal cancer. There were several suspicious small perirectal and small pelvic sidewall lymph nodes. Prominent perirectal vessels were also noted. MRI of the pelvis on 10/24/16 showed a large mass centered at the anus with extension into the low rectum without obstruction. There was suspicious mild mesorectal adenopathy, and a right inguinal hernia containing nonobstructive small bowel.   The patient comes to the clinic for consultation today accompanied by his partner and daughter. He denies abdominal cramps, nausea, bowel or bladder concerns. He reports taking OTC stool softener as needed. He reports his appetite is good, but he has lost 12-15 lbs in the past few weeks. His daughter notes she has hemochromatosis and questions if this could be playing a role in the patient's anemia.  CURRENT THERAPY:  -FOLFIRINOX with dose reduced oxaliplatin plus panitumumab every 2 weeks starting on 01/31/18   INTERIM HISTORY:  DONELLE BABA presents for follow up after palliative radiation and to start FOLFIRINOX. He presents to the clinic today with his wife. He notes his skin is peeling more on his hands. He notes he has been having  antibiotic ointment. He notes his last treatment had his energy very low. He is normally much more high energy. He notes he has been trying to eat but has no appetite. His energy is improving more this week. He stopped taking oxycodone last week and is now on tylenol and CBD oil.     On review of symptoms, pt notes his rash is improving. He has peeling on his hands. He denies diarrhea but noted 3 days of loose stool. His pain has much improved but he still walks with a cane due to energy. He has no appetite due to taste change but still eating and gaining weight. He denied fever.      MEDICAL HISTORY:  Past Medical History:  Diagnosis Date  . Allergy   . Cancer ([place]) 10/09/2016   rectal adenocarcinoma  . Inguinal hernia     SURGICAL HISTORY: Past Surgical History:  Procedure Laterality Date  . COLONOSCOPY W/ BIOPSIES Bilateral 10/09/2016   rectal adenocarcinoma  . IR FLUORO GUIDE PORT INSERTION RIGHT  11/13/2017  . IR US GUIDE VASC ACCESS RIGHT  11/13/2017  . MOUTH SURGERY     age 6, to remove extra "eye" teeth  . [place] teeth extraction    . XI ROBOT ABDOMINAL PERINEAL RESECTION N/A 02/21/2017  Procedure: XI ROBOT ABDOMINOPERINEAL RESECTION WITH COLOSTOMY;  Surgeon: Michael Boston, MD;  Location: [facility];  Service: General;  Laterality: N/A;    SOCIAL HISTORY: Social History   Socioeconomic History  . Marital status: Soil scientist    Spouse name: Not on file  . Number of children: 1  . Years of education: Not on file  . Highest education level: Not on file  Occupational History  . Occupation: Architectural technologist  Social Needs  . Financial resource strain: Not on file  . Food insecurity:    Worry: Not on file    Inability: Not on file  . Transportation needs:    Medical: Not on file    Non-medical: Not on file  Tobacco Use  . Smoking status: Former Smoker    Years: 2.00    Types: Cigarettes    Last attempt to quit: 2002    Years since quitting: 17.4  .  Smokeless tobacco: Never Used  Substance and Sexual Activity  . Alcohol use: Not Currently    Alcohol/week: 6.0 oz    Types: 4 Glasses of wine, 6 Cans of beer per week    Comment: beer or a glass or wine most days of the week  . Drug use: No  . Sexual activity: Yes    Partners: Female  Lifestyle  . Physical activity:    Days per week: Not on file    Minutes per session: Not on file  . Stress: Not on file  Relationships  . Social connections:    Talks on phone: Not on file    Gets together: Not on file    Attends religious service: Not on file    Active member of club or organization: Not on file    Attends meetings of clubs or organizations: Not on file    Relationship status: Not on file  . Intimate partner violence:    Fear of current or ex partner: Not on file    Emotionally abused: Not on file    Physically abused: Not on file    Forced sexual activity: Not on file  Other Topics Concern  . Not on file  Social History Narrative  . Not on file    FAMILY HISTORY: Family History  Problem Relation Age of Onset  . Other Mother        bile duct tumor  . Stroke Mother   . Cancer Mother 60       bile duct cancer   . Prostate cancer Father 9  . Emphysema Father   . Colon cancer Neg Hx   . Esophageal cancer Neg Hx   . Pancreatic cancer Neg Hx   . Rectal cancer Neg Hx   . Stomach cancer Neg Hx     ALLERGIES:  is allergic to sulfa antibiotics.  MEDICATIONS:  Current Outpatient Medications  Medication Sig Dispense Refill  . acetaminophen (TYLENOL) 500 MG tablet Take 500 mg by mouth every 6 (six) hours as needed.    . clindamycin (CLINDAGEL) 1 % gel Apply topically 2 (two) times daily. 60 g 1  . Cyanocobalamin (VITAMIN B 12 PO) Take 1 tablet by mouth daily.    Marland Kitchen doxycycline (VIBRA-TABS) 100 MG tablet Take 1 tablet (100 mg total) by mouth 2 (two) times daily. 60 tablet 2  . gabapentin (NEURONTIN) 100 MG capsule Take 1 capsule (100 mg total) by mouth at bedtime. 60  capsule 0  . lidocaine-prilocaine (EMLA) cream Apply 1 application topically as needed. 30 g  2  . lidocaine-prilocaine (EMLA) cream Apply to affected area once 30 g 3  . loperamide (IMODIUM A-D) 2 MG tablet Take 1 tablet (2 mg total) by mouth 4 (four) times daily as needed. Take 2 at diarrhea onset , then 1 every 2hr until 12hrs with no BM. May take 2 every 4hrs at night. If diarrhea recurs repeat. 100 tablet 1  . LORazepam (ATIVAN) 1 MG tablet Take 1 tablet (1 mg total) by mouth every 6 (six) hours as needed (NAUSEA). 30 tablet 0  . Multiple Vitamin (MULTIVITAMIN WITH MINERALS) TABS tablet Take 1 tablet by mouth every other day.     . nystatin (MYCOSTATIN) 100000 UNIT/ML suspension Take 5 mLs (500,000 Units total) by mouth 4 (four) times daily. 473 mL 0  . ondansetron (ZOFRAN) 8 MG tablet Take 1 tablet (8 mg total) by mouth 2 (two) times daily as needed for refractory nausea / vomiting. Start on day 3 after chemotherapy. 30 tablet 1  . prochlorperazine (COMPAZINE) 10 MG tablet Take 1 tablet (10 mg total) by mouth every 6 (six) hours as needed (NAUSEA). 30 tablet 1  . dexamethasone (DECADRON) 4 MG tablet Take 2 tablets (8 mg total) by mouth daily. Start the day after chemo for 2 days. (Patient not taking: Reported on 01/31/2018) 8 tablet 5  . oxyCODONE-acetaminophen (PERCOCET/ROXICET) 5-325 MG tablet Take 1-2 tablets by mouth every 6 (six) hours as needed for severe pain. (Patient not taking: Reported on 01/31/2018) 40 tablet 0  . traMADol (ULTRAM) 50 MG tablet Take 1-2 tablets (50-100 mg total) by mouth every 8 (eight) hours as needed. (Patient not taking: Reported on 01/31/2018) 60 tablet 0   Current Facility-Administered Medications  Medication Dose Route Frequency Provider Last Rate Last Dose  . 0.9 %  sodium chloride infusion  500 mL Intravenous Continuous Pyrtle, Lajuan Lines, MD      . 0.9 %  sodium chloride infusion  500 mL Intravenous Once Pyrtle, Lajuan Lines, MD       Facility-Administered Medications  Ordered in Other Visits  Medication Dose Route Frequency Provider Last Rate Last Dose  . 0.9 %  sodium chloride infusion   Intravenous Once Truitt Merle, MD      . atropine injection 0.5 mg  0.5 mg Intravenous Once PRN Truitt Merle, MD      . dextrose 5 % solution   Intravenous Once Truitt Merle, MD      . fluorouracil (ADRUCIL) 4,150 mg in sodium chloride 0.9 % 67 mL chemo infusion  2,200 mg/m2 (Treatment Plan Recorded) Intravenous 1 day or 1 dose Truitt Merle, MD      . fosaprepitant (EMEND) 150 mg, dexamethasone (DECADRON) 12 mg in sodium chloride 0.9 % 145 mL IVPB   Intravenous Once Truitt Merle, MD 454 mL/hr at 01/31/18 1323    . irinotecan (CAMPTOSAR) 280 mg in dextrose 5 % 500 mL chemo infusion  150 mg/m2 (Treatment Plan Recorded) Intravenous Once Truitt Merle, MD      . leucovorin 756 mg in dextrose 5 % 250 mL infusion  400 mg/m2 (Treatment Plan Recorded) Intravenous Once Truitt Merle, MD      . oxaliplatin (ELOXATIN) 130 mg in dextrose 5 % 500 mL chemo infusion  70 mg/m2 (Treatment Plan Recorded) Intravenous Once Truitt Merle, MD      . panitumumab (VECTIBIX) 400 mg in sodium chloride 0.9 % 100 mL chemo infusion  6 mg/kg (Treatment Plan Recorded) Intravenous Once Truitt Merle, MD  REVIEW OF SYSTEMS:  Constitutional: Denies fevers, chills or abnormal night sweats (+) no appetite/taste change (+) lowered energy Eyes: Denies blurriness of vision, double vision or watery eyes Ears, nose, mouth, throat, and face: Denies mucositis or sore throat  Respiratory: Denies cough, dyspnea or wheezes Cardiovascular: Denies palpitation, chest discomfort or  Gastrointestinal:  Denies nausea, heartburn or change in bowel habits (+) colostomy bag  Skin: (+) rash on face and neck, improving (+) dry hands with skin peeling  Lymphatics: Denies new lymphadenopathy or easy bruising MSK:  (+) right glute pain and right mid-thigh numbness, with limp, much improved (+) ambulates with cane Neurological: (+) improved neuropathy, mild    Behavioral/Psych: Mood is stable, no new changes  All other systems were reviewed with the patient and are negative.   PHYSICAL EXAMINATION:   ECOG PERFORMANCE STATUS: 2  Vitals:   01/31/18 1158  BP: 100/81  Pulse: 87  Resp: 18  Temp: 97.6 F (36.4 C)  SpO2: 100%   Filed Weights   01/31/18 1158  Weight: 134 lb 6.4 oz (61 kg)     GENERAL: alert, no distress and comfortable.  SKIN: skin color, texture, turgor are normal, (+) diffuse acne-like rash on his face, neck and upper chest, no diffuse skin erythema. NECK: supple, thyroid normal size, non-tender, without nodularity LYMPH: (+) 2x 1.5 cm left supraclavicular lymph node is now barely palpable, non-tender LUNGS: clear to auscultation and percussion with normal breathing effort HEART: regular rate & rhythm and no murmurs and no lower extremity edema ABDOMEN: grapefruit size mass palpated in the RLQ. abdomen soft, non-tender and normal bowel sounds (+) laparoscopic incisions all healed Musculoskeletal: no cyanosis of digits and no clubbing  PSYCH: alert & oriented x 3 with fluent speech NEURO: no focal motor/sensory deficits. (+) mild weakness upon lifting right leg.   LABORATORY DATA:  I have reviewed the data as listed CBC Latest Ref Rng & Units 01/31/2018 01/17/2018 01/03/2018  WBC 4.0 - 10.3 K/uL 4.4 5.2 10.5(H)  Hemoglobin 13.0 - 17.1 g/dL 10.6(L) 10.7(L) 11.5(L)  Hematocrit 38.4 - 49.9 % 31.1(L) 32.2(L) 34.1(L)  Platelets 140 - 400 K/uL 301 381 376   CMP Latest Ref Rng & Units 01/31/2018 01/17/2018 01/03/2018  Glucose 70 - 140 mg/dL 104 143(H) 106  BUN 7 - 26 mg/dL '13 15 15  '$ Creatinine 0.70 - 1.30 mg/dL 0.68(L) 0.72 0.86  Sodium 136 - 145 mmol/L 135(L) 132(L) 133(L)  Potassium 3.5 - 5.1 mmol/L 3.5 3.5 4.2  Chloride 98 - 109 mmol/L 101 99 98  CO2 22 - 29 mmol/L '25 24 25  '$ Calcium 8.4 - 10.4 mg/dL 8.7 9.0 10.3  Total Protein 6.4 - 8.3 g/dL 6.4 7.5 8.3  Total Bilirubin 0.2 - 1.2 mg/dL 0.5 0.8 0.7  Alkaline Phos 40 - 150  U/L 121 149 156(H)  AST 5 - 34 U/L '18 17 23  '$ ALT 0 - 55 U/L '6 12 17       '$ CEA ([facility])  10/27/2016: 7.81 11/27/2016: 5.56 01/10/2017: 5.61 03/14/17: 5.11 04/11/17: 5.87 05/23/17: 8.79 06/19/17:  9.15 07/16/17:  9.88 08/10/17: 10.40 09/18/17: 8.31 10/30/17: 8.11 11/20/17: 7.96 12/25/2017: 13.95 01/17/18: 10.68   PATHOLOGY:  10/29/17 Diagnosis Lymph node, needle/core biopsy, left supraclavicular - METASTATIC ADENOCARCINOMA, CONSISTENT WITH COLORECTAL PRIMARY. - SEE COMMENT. Microscopic Comment The malignant cells are positive for CDX-2 and cytokeratin 20. They are negative for cytokeratin 7, p63 and cytokeratin 5/6. The findings are consistent with primary colorectal adenocarcinoma. Dr. Thressa Sheller has reviewed the  case and concurs with this interpretation. Dr. Burr Medico was paged on 10/30/17. Additional studies can be performed upon clinician request. (JBK:gt, 10/31/17)    Diagnosis 02/21/17 1. Colon, segmental resection for tumor, rectosigmoid - INVASIVE ADENOCARCINOMA, MODERATELY DIFFERENTIATED, SPANNING 3.9 CM. - TUMOR INVADES THROUGH MUSCULARIS PROPRIA. - RESECTION MARGINS ARE NEGATIVE. - EIGHT OF EIGHT LYMPH NODES NEGATIVE FOR CARCINOMA (0/8), SEE COMMENT. - SEE ONCOLOGY TABLE. 2. Soft tissue, biopsy, left lateral pelvic floor - BENIGN SKELETAL MUSCLE AND SOFT TISSUE. - NO MALIGNANCY IDENTIFIED. 3. Colon, segmental resection, proximal sigmoid - BENIGN COLONIC MUCOSA. - NO DYSPLASIA OR MALIGNANCY. 4. Liver, biopsy, liver mass - BENIGN VASCULAR PROLIFERATION CONSISTENT WITH HEMANGIOMA. - NO MALIGNANCY IDENTIFIED. Microscopic Comment 1. COLON AND RECTUM (INCLUDING TRANS-ANAL RESECTION): Specimen: Rectosigmoid colon with anus. Procedure: Abdominoperineal resection. Tumor site: Distal 1/3 rectum to anus. Specimen integrity: Intact. Macroscopic intactness of mesorectum: Incomplete. Macroscopic tumor perforation: Not identified. Invasive tumor: Maximum size: 3.9  cm Histologic type(s): Invasive adenocarcinoma. Histologic grade and differentiation: G2: moderately differentiated/low grade Type of polyp in which invasive carcinoma arose: N/A. Microscopic extension of invasive tumor: Tumor invades through muscularis propria. Lymph-Vascular invasion: Not identified. 1 of 3 FINAL for RYLYNN, SCHONEMAN [phone]) Microscopic Comment(continued) Peri-neural invasion: Present. Tumor deposit(s) (discontinuous extramural extension): Not identified. Resection margins: Proximal margin: Negative. Distal margin: Negative. Circumferential (radial) (posterior ascending, posterior descending; lateral and posterior mid-rectum; and entire lower 1/3 rectum): Negative. Mesenteric margin (sigmoid and transverse): Negative. Distance closest margin (if all above margins negative): <0.1 cm radial margin. >1 cm distal margin. Treatment effect (neo-adjuvant therapy): Present Additional polyp(s): N/A. Non-neoplastic findings: None. Lymph nodes: number examined 8; number positive: 0 (treatment effect present). Pathologic Staging: ypT3, ypN0, ypMX Ancillary studies: Can be performed upon request. Comment: The fat was cleared and additional lymph nodes were not identified.  Diagnosis 10/09/2016 Rectum, biopsy, mass - ADENOCARCINOMA.  RADIOGRAPHIC STUDIES: I have personally reviewed the radiological images as listed and agreed with the findings in the report.  CT CAP with contrast, 01/08/2018 IMPRESSION: 1. Today's study demonstrates dramatic progression of disease with interval development of widespread pulmonary metastases, multiple new liver lesions and progressive lymphadenopathy noted in the pelvis, retroperitoneum and left supraclavicular nodal stations. In addition, the previously noted metastatic lesion in the right psoas muscle has markedly increased in size. Whether or not this reflects growth of the lesion and/or internal hemorrhage of the lesion  is uncertain. 2. Stable postoperative thickening and small postoperative fluid collection in the presacral space, as above, following abdominal perineal resection. 3. Large right inguinal hernia containing several loops of small bowel, without evidence of bowel incarceration or obstruction at this time.  PET Scan 10/17/17 IMPRESSION: Increased hypermetabolic lymphadenopathy in abdominal retroperitoneum, right common iliac chain, and left supraclavicular region, consistent with metastatic disease. Stable tiny bilateral pulmonary nodules show no FDG uptake but are too small to characterize by PET. Recommend continued attention on follow-up CT. 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   CT CAP W Contrast 09/18/17 IMPRESSION: Interval abdomino-peritoneal resection with left lower quadrant colostomy. Increased diffuse cystitis, likely due to radiation. New presacral soft tissue density with central low attenuation, also suspicious for post treatment changes. Recommend continued attention on follow-up imaging. New mild retroperitoneal lymphadenopathy in the aortocaval space, measuring up to 1.8 cm. This is suspicious for metastatic disease, although it is conceivable that this could be reactive in etiology due to interval surgery and radiation therapy. Recommend short-term follow-up by CT in 3 months. New indeterminate 4  mm left upper lobe pulmonary nodule, likely inflammatory in etiology with metastatic disease considered less likely. Recommend continued attention on follow-up CT. Bilateral inguinal hernias, largest on the right containing several small bowel loops.   Colonoscopy 08/15/17 IMPRESSION - Patent end colostomy with healthy appearing mucosa in the sigmoid colon. - The entire examined colon is normal. - No specimens collected.  MRI Pelvis W WO Contrast 01/29/17 IMPRESSION: Significant decrease in size of soft tissue mass at the  anorectal junction, with post radiation changes. No residual perirectal lymphadenopathy or other signs of pelvic metastatic disease. Stable small right inguinal hernia containing small bowel.  CT A/P W Contrast 12/07/16 IMPRESSION: 1. Abnormal dilatation of the proximal small bowel loops worrisome for either small bowel obstruction versus small bowel ileus. 2. 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 3. Large right inguinal hernia containing edematous loop of distal small bowel 4. No pneumatosis or bowel perforation.  No abscess. 5. Aortic atherosclerosis 6. Mild decrease in size of rectal mass. No change in right common iliac adenopathy.  DG Chest 2 View 12/03/2016 IMPRESSION: 1. No active cardiopulmonary disease. No evidence of pneumonia or pulmonary edema. No evidence of intrathoracic metastasis. 2. Hyperexpanded lungs suggesting COPD.  MRI Pelvis 10/24/2016 IMPRESSION: 1. Moderately motion degraded exam. 2. Large mass is centered at the anus with extension into the low rectum. No complicating obstruction. 3. Mild mesorectal adenopathy, suspicious. 4. Right inguinal hernia containing nonobstructive small bowel.  CT CAP w Contrast 10/13/2016 IMPRESSION: Large irregular enhancing rectal mass consistent with known rectal cancer. There are small perirectal and small pelvic sidewall lymph nodes which are suspicious. Prominent perirectal vessels are also noted. No evidence of metastatic retroperitoneal or sigmoid mesocolon adenopathy or hepatic or pulmonary metastasis.  COLONOSCOPY 10/09/2016 Dr. Hilarie Fredrickson  A fungating, infiltrative and ulcerated partially obstructing large mass was found in the distal rectum. The mass was circumferential. The mass measured seven cm in length (prolapsing and from dentate line to approx 7 cm). In addition, its inner diameter measured nine mm. Oozing  was present. This was biopsied with a cold forceps for histology. - Due to the distal rectal mass the bowel preparation was poor and unable to be cleared via the adult upper endoscope (adult upper endoscope used due to obstructing mass). Scope not advanced proximal to the rectosigmoid colon.  ASSESSMENT & PLAN: 62 y.o.  gentleman, previously healthy, presented with an anal mass and a mild intermittent rectal bleeding for 5-6 months. Fatigue, anorexia, fatigue and weight loss for a month. He is currently on radiation for his rectal cancer, chemotherapy has been held lately.  1. Rectal Adenocarcinoma, low rectum, HA1P3XT0, ypT3N0, stage IIIB, MSI-stable, nodes metastasis 09/2017, KRAS/NRAS/BRAF wild type  -I previously reviewed his CT and MRI scan, colonoscopy and biopsy findings in detail with pt and his family members -Based on his imaging findings, he had clinical stage III disease. We reviewed his image in our GI tumor Board, he also has a small liver lesion which is indeterminate, likely benign.  -patient received neoadjuvant chemotherapy and radiation with Xeloda 10/1416-12/19/16, which was held for the last few weeks due to infectious colitis. -He underwent APR on 02/21/2017, surgical margins were negative, all lymph nodes were negative. We've reviewed his surgical pathology findings he did have significant treatment effect on his surgical sample.  -He recovered well from surgery, started adjuvant CAPOX 04/04/17. He completed 5 cycles of oxaliplatin on 07/16/17 and  completed Xeloda on 08/12/17. His oxaliplatin treatment was postponed a few times due to his working schedule -He had repeated colonoscopy by Dr. Hilarie Fredrickson in 07/2017 which was negative  -We discussed his CT CAP from 09/18/17 which shows multiple slightly enlarged RP lymph nodes and a 4 mm lung nodule in the left lower lobe. PET Scan from 10/17/17 reveals increased hypermetabolic lymphadenopathy in abdominal retroperitoneum, right common iliac  chain, and left supraclavicular region, consistent with metastatic disease and stable tiny bilateral pulmonary nodules show no FDG uptake but are too small to characterize by PET. I highly suspect metastatic disease.  -He underwent left supraclavicular lymph node biopsy on 10/29/17.  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 Given his rapid Recurrence (2 months) after completion of adjuvant Capox, I recommend FOLFIRI as his next line therapy. -He went to [facility] for 2nd opinion with Dr. Aleatha Borer on 3/18 and understands he is not a candidate for clinical trial [id number] at [facility] and chose to proceed with current chemotherapy plan.  -He started FOLFIRI on 11/20/17 and has tolerated first cycle well.  -His FO genomic testing showed MSI stable disease, K-ras, NRAS, BRAF wild-type, no other targetable mutations.  Panitumumab was added on from cycle 3.  -I previously reviewed his 01/08/18 restaging CT scan findings with patient and his family, unfortunately he has dramatic progression of the right metastatic lesion in the right psoas muscle which is 13.9 cm, and he also developed new pulmonary, liver metastasis. We discussed this could be partially related to his delay of treatment, and he has had only 3 cycle chemo, 1 cycle of panitumumab. Unfortunately the biology of his tumor is extremely aggressive, I recommend him to consider more intense chemotherapy FOLFIRINOX every 2 weeks and continue panitumumab -Due to his worsening right flank pain, he received palliative radiation to the right psoas muscle mass.  His pain has significantly improved after radiation. -He has recovered well from radiation.  Labs reviewed and adequate to proceed with FOLFIRINOX with dose reduced oxaliplatin plus Vectibix today  -I again advised him to avoid cold substances first week of treatment. I discussed with restarting oxaliplatin there is an  increased likelihood of a having a reaction. We will give steroids in premedication and he should take dexamethasone '4mg'$  once in the morning for 3-5 days after treatment. We will watch him closely for any signs of reaction.  -Given he will be getting neulasta I recommend he take OTC Claritin for 5 days after injection for bone pain.  -I advised him to contact us if he develops fever or experiences significant or unexpected side effects.  -He provided a list of supplement options for patient to review with our pharmacist. -F/u in 2 weeks  2. 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 This was confirmed by his iron study. -His daughter was diagnosed with hemochromatosis (homozygous), his genetic testing showed he is a carrier -He received IV iron once, he previously took some oral iron, I'll be cautious about IV iron replacement due to his hemachromatosis carrier status. -will monitor his CBC and iron level closely. -Hg stable at 10.6 today (01/31/18)  3. Peripheral neuropathy, secondary to chemo, G1  -mild numbness, no decreased sensation on exam, hand function is normal  -He has slightly more  noticeable neuropathy, but hand functions are normal and adequate for treatment -He takes B complex and his neuropathy has improved -He has been taking 2 tablet of 100 mg gabapentin at night and increased to '100mg'$  in the AM.    4.Goal of care discussion  -We again discussed the incurable nature of his cancer, and the overall poor prognosis due to the metastatic disease, especially if he does not have good response to chemotherapy or progress on chemo -The patient understands the goal of care is palliative, to prolong his life. -He is full code for now   5. Right sacral and anterolateral upper leg pain with associated  numbness to mid-thigh   -Likely secondary to right iliac adenopathy and psoas involvement. Has been taking tramadol 2 tabs BID and supplementing with tylenol as well as integrating non-pharm measures.  -Pain is well managed but interfering with sleep. His work requires him to be on his feet for prolonged periods. He does not like "fog" and constipation effects of tramadol, therefore not interested in escalating opioids at this time.  -Pain much improved after palliative radiation. He no longer is taking oxycodone, just Tylenol and CBD oil pen.   6. Constipation  -He uses Senakot and miralax PRN and will use magnesium citrate as a back up if he still has no relief.  -He understands indications for using laxative vs anti-diarrheal if he develops change in his bowel habits.  -I previously encouraged him to increase water intake if he develops diarrhea and call clinic if not controlled by imodium -Constipation resolved currently, likely due to chemotherapy. Bowel habits overall regular.  7. Rash on face and neck, secondary to panitumumab  -Secondary to Vectibix -He will continue Clindamycin gel and hydrocortisone cream. I previously prescribed doxycycline to help control his rash.  -I recommended he use a hat and sunscreen when out in the sun.  -Rash is improving with oral doxycycline and topical treatments.    Plan   -I refilled Gabapentin today  -labs reviewed and adequate to proceed with first cycle FOLFIRINOX with dose reduction and Vectibix today  -Lab, flush, f/u and FOLFIRINOX and Vectibix in 2 weeks     All questions were answered. The patient knows to call the clinic with any problems, questions or concerns.  I spent 20 minutes counseling the patient face to face. The total time spent in the appointment was 25 minutes and more than 50% was on counseling.   Truitt Merle, MD 01/31/2018   I, Joslyn Devon, am acting as scribe for Truitt Merle, MD.   I have reviewed the above documentation  for accuracy and completeness, and I agree with the above.

## 2018-02-01 ENCOUNTER — Telehealth: Payer: Self-pay | Admitting: Hematology

## 2018-02-01 ENCOUNTER — Ambulatory Visit: Payer: BLUE CROSS/BLUE SHIELD

## 2018-02-01 NOTE — Telephone Encounter (Signed)
No los 6/6 °

## 2018-02-02 ENCOUNTER — Inpatient Hospital Stay: Payer: BLUE CROSS/BLUE SHIELD

## 2018-02-02 VITALS — BP 108/82 | HR 79 | Temp 98.3°F | Resp 17

## 2018-02-02 DIAGNOSIS — D500 Iron deficiency anemia secondary to blood loss (chronic): Secondary | ICD-10-CM

## 2018-02-02 DIAGNOSIS — C20 Malignant neoplasm of rectum: Secondary | ICD-10-CM | POA: Diagnosis not present

## 2018-02-02 MED ORDER — HEPARIN SOD (PORK) LOCK FLUSH 100 UNIT/ML IV SOLN
500.0000 [IU] | Freq: Once | INTRAVENOUS | Status: AC | PRN
  Administered 2018-02-02: 500 [IU]
  Filled 2018-02-02: qty 5

## 2018-02-02 NOTE — Patient Instructions (Signed)
Implanted Port Home Guide An implanted port is a type of central line that is placed under the skin. Central lines are used to provide IV access when treatment or nutrition needs to be given through a person's veins. Implanted ports are used for long-term IV access. An implanted port may be placed because:  You need IV medicine that would be irritating to the small veins in your hands or arms.  You need long-term IV medicines, such as antibiotics.  You need IV nutrition for a long period.  You need frequent blood draws for lab tests.  You need dialysis.  Implanted ports are usually placed in the chest area, but they can also be placed in the upper arm, the abdomen, or the leg. An implanted port has two main parts:  Reservoir. The reservoir is round and will appear as a small, raised area under your skin. The reservoir is the part where a needle is inserted to give medicines or draw blood.  Catheter. The catheter is a thin, flexible tube that extends from the reservoir. The catheter is placed into a large vein. Medicine that is inserted into the reservoir goes into the catheter and then into the vein.  How will I care for my incision site? Do not get the incision site wet. Bathe or shower as directed by your health care provider. How is my port accessed? Special steps must be taken to access the port:  Before the port is accessed, a numbing cream can be placed on the skin. This helps numb the skin over the port site.  Your health care provider uses a sterile technique to access the port. ? Your health care provider must put on a mask and sterile gloves. ? The skin over your port is cleaned carefully with an antiseptic and allowed to dry. ? The port is gently pinched between sterile gloves, and a needle is inserted into the port.  Only "non-coring" port needles should be used to access the port. Once the port is accessed, a blood return should be checked. This helps ensure that the port  is in the vein and is not clogged.  If your port needs to remain accessed for a constant infusion, a clear (transparent) bandage will be placed over the needle site. The bandage and needle will need to be changed every week, or as directed by your health care provider.  Keep the bandage covering the needle clean and dry. Do not get it wet. Follow your health care provider's instructions on how to take a shower or bath while the port is accessed.  If your port does not need to stay accessed, no bandage is needed over the port.  What is flushing? Flushing helps keep the port from getting clogged. Follow your health care provider's instructions on how and when to flush the port. Ports are usually flushed with saline solution or a medicine called heparin. The need for flushing will depend on how the port is used.  If the port is used for intermittent medicines or blood draws, the port will need to be flushed: ? After medicines have been given. ? After blood has been drawn. ? As part of routine maintenance.  If a constant infusion is running, the port may not need to be flushed.  How long will my port stay implanted? The port can stay in for as long as your health care provider thinks it is needed. When it is time for the port to come out, surgery will be   done to remove it. The procedure is similar to the one performed when the port was put in. When should I seek immediate medical care? When you have an implanted port, you should seek immediate medical care if:  You notice a bad smell coming from the incision site.  You have swelling, redness, or drainage at the incision site.  You have more swelling or pain at the port site or the surrounding area.  You have a fever that is not controlled with medicine.  This information is not intended to replace advice given to you by your health care provider. Make sure you discuss any questions you have with your health care provider. Document  Released: 08/14/2005 Document Revised: 01/20/2016 Document Reviewed: 04/21/2013 Elsevier Interactive Patient Education  2017 [employer]  

## 2018-02-04 ENCOUNTER — Ambulatory Visit: Payer: BLUE CROSS/BLUE SHIELD

## 2018-02-13 NOTE — Progress Notes (Signed)
[place]  [phone] [phone]  Clinic Follow Up Note   Patient Care Team: Robyn Haber, MD as PCP - General (Family Medicine) Michael Boston, MD as Consulting Physician (General Surgery) Pyrtle, Lajuan Lines, MD as Consulting Physician (Gastroenterology) Truitt Merle, MD as Consulting Physician (Hematology and Oncology) Kyung Rudd, MD as Consulting Physician (Radiation Oncology)   Date of Service:  02/14/2018  CHIEF COMPLAINTS:  Follow up rectal adenocarcinoma  Oncology History   Cancer Staging Rectal adenocarcinoma [facility]) Staging form: Colon and Rectum, AJCC 8th Edition - Clinical stage from 10/09/2016: Stage IIIB (cT3, cN1b, cM0) - Signed by Truitt Merle, MD on 10/25/2016 - Pathologic stage from 02/21/2017: Stage IIA (ypT3, pN0, cM0) - Signed by Truitt Merle, MD on 10/18/2017       Rectal adenocarcinoma ([place])   10/03/2016 - 10/03/2016 Hospital Admission    Admit date: 10/03/16 The patient presented to the [facility] ED with rectal bleeding and difficulty with hygiene.      10/09/2016 Initial Diagnosis    Rectal adenocarcinoma       10/09/2016 Procedure    Colonoscopy showed a fungating, infiltrative and ulcerated partially obstructing large mass in the distal rectum, measuring 9 cm, prolapsing at anal canal. Scope not advanced proximal to the rectosigmoid colon.      10/09/2016 Initial Biopsy    Rectal mass biopsy showed invasive adenocarcinoma.      10/13/2016 Imaging    CT Chest Abdomen Pelvis w contrast IMPRESSION: Large irregular enhancing rectal mass consistent with known rectal cancer. There are small perirectal and small pelvic sidewall lymph nodes which are suspicious. Prominent perirectal vessels are also noted. No evidence of metastatic retroperitoneal or sigmoid mesocolon adenopathy or hepatic or pulmonary metastasis.      10/24/2016 Imaging    MRI Pelvis IMPRESSION: 1. Moderately motion degraded exam. 2. Large mass is centered at  the anus with extension into the low rectum. No complicating obstruction. 3. Mild mesorectal adenopathy, suspicious. 4. Right inguinal hernia containing nonobstructive small bowel.       11/08/2016 - 12/19/2016 Chemotherapy    Xeloda 50 mg twice daily, with concurrent radiation, held since 12/03/2016 due to colitis and hospitalization      11/08/2016 - 12/19/2016 Radiation Therapy    Neoadjuvant radiation to his rectal cancer  1) Pelvis/ 45 Gy in 25 fractions  2) Rectum boost/ 9 Gy in 5 fractions Under the care of Dr. Lisbeth Renshaw.      12/03/2016 - 12/09/2016 Hospital Admission    He presented with few days to one week history of diarrhea with 6-7 loose stools daily, along with a fever of 101.5 on the day of admission. Patient was also noted to be hypokalemic, hyponatremic. Hospitalized for further management. Stool studies were sent. C. difficile was negative. GI pathogen panel was positive for Yersinia.  XELODA was held at this time, until infection has cleared.       12/07/2016 Imaging    CT A/P W Contrast  IMPRESSION: 1. Abnormal dilatation of the proximal small bowel loops worrisome for either small bowel obstruction versus small bowel ileus. 2. 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 3. Large right inguinal hernia containing edematous loop of distal small bowel 4. No pneumatosis or bowel perforation.  No abscess. 5. Aortic atherosclerosis 6. Mild decrease in size of rectal mass. No change in right common iliac adenopathy.      02/21/2017 Surgery  XI ROBOT ABDOMINOPERINEAL RESECTION WITH COLOSTOMY by Dr. Johney Maine      02/21/2017 Pathology Results    Diagnosis 02/21/17 1. Colon, segmental resection for tumor, rectosigmoid - INVASIVE ADENOCARCINOMA, MODERATELY DIFFERENTIATED, SPANNING 3.9 CM. - TUMOR INVADES THROUGH MUSCULARIS PROPRIA. - RESECTION MARGINS ARE NEGATIVE. - EIGHT OF  EIGHT LYMPH NODES NEGATIVE FOR CARCINOMA (0/8), SEE COMMENT. - SEE ONCOLOGY TABLE. 2. Soft tissue, biopsy, left lateral pelvic floor - BENIGN SKELETAL MUSCLE AND SOFT TISSUE. - NO MALIGNANCY IDENTIFIED. 3. Colon, segmental resection, proximal sigmoid - BENIGN COLONIC MUCOSA. - NO DYSPLASIA OR MALIGNANCY. 4. Liver, biopsy, liver mass - BENIGN VASCULAR PROLIFERATION CONSISTENT WITH HEMANGIOMA. - NO MALIGNANCY IDENTIFIED.      04/04/2017 - 08/12/2017 Chemotherapy    CAPOX every 3 weeks for 6 cycles. I decreased his first cycle of oxaliplatin platinum by 15%, he tolerated well, so we increased to full dose from cycle 2. He completed 5 cycles of oxaliplatin on 07/16/17 and completed Xeloda on 08/12/17.        08/15/2017 Procedure    Colonoscopy 08/15/17 IMPRESSION - Patent end colostomy with healthy appearing mucosa in the sigmoid colon. - The entire examined colon is normal. - No specimens collected.      09/18/2017 Imaging    CT CAP W Contrast 09/18/17 IMPRESSION: Interval abdomino-peritoneal resection with left lower quadrant colostomy. Increased diffuse cystitis, likely due to radiation. New presacral soft tissue density with central low attenuation, also suspicious for post treatment changes. Recommend continued attention on follow-up imaging. New mild retroperitoneal lymphadenopathy in the aortocaval space, measuring up to 1.8 cm. This is suspicious for metastatic disease, although it is conceivable that this could be reactive in etiology due to interval surgery and radiation therapy. Recommend short-term follow-up by CT in 3 months. New indeterminate 4 mm left upper lobe pulmonary nodule, likely inflammatory in etiology with metastatic disease considered less likely. Recommend continued attention on follow-up CT. Bilateral inguinal hernias, largest on the right containing several small bowel loops.      10/17/2017 PET scan    MPRESSION: Increased hypermetabolic  lymphadenopathy in abdominal retroperitoneum, right common iliac chain, and left supraclavicular region, consistent with metastatic disease. Stable tiny bilateral pulmonary nodules show no FDG uptake but are too small to characterize by PET. Recommend continued attention on follow-up CT. 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      10/29/2017 Relapse/Recurrence    Diagnosis Lymph node, needle/core biopsy, left supraclavicular - METASTATIC ADENOCARCINOMA, CONSISTENT WITH COLORECTAL PRIMARY. - SEE COMMENT.      11/20/2017 - 01/17/2018 Chemotherapy    FOLFIRI every 2 weeks, panitumumab was added from cycle 3.  FOLFIRI was held after cycle 3 due to disease progression, he continued with 5-FU and Panitumumab until further disease progression.        01/08/2018 Imaging    CT CAP with contrast IMPRESSION: 1. Today's study demonstrates dramatic progression of disease with interval development of widespread pulmonary metastases, multiple new liver lesions and progressive lymphadenopathy noted in the pelvis, retroperitoneum and left supraclavicular nodal stations. In addition, the previously noted metastatic lesion in the right psoas muscle has markedly increased in size. Whether or not this reflects growth of the lesion and/or internal hemorrhage of the lesion is uncertain. 2. Stable postoperative thickening and small postoperative fluid collection in the presacral space, as above, following abdominal perineal resection. 3. Large right inguinal hernia containing several loops of small bowel, without evidence of bowel incarceration or obstruction at this time.  01/14/2018 - 01/24/2018 Radiation Therapy    Radiation with Dr. Lisbeth Renshaw starting 01/14/18. Plan to complete on 01/24/18      01/31/2018 -  Chemotherapy    FOLFIRINOX plus Vectibix every 2 weeks starting 01/31/18      HISTORY OF PRESENTING ILLNESS (10/25/16):  David Clarke 62 y.o. male is here  because of his recently diagnosed rectal adenocarcinoma. He was referred by his gastroenterologist title parietal. Patient presents to my clinic, accompanied by his wife and his daughter.       He has noticed a anal mass accompanied by intermittent bleeding and pain in the rectum for the past 5 months. The patient initially presented to the [facility] ED on 10/03/16 with rectal bleeding and difficulty with hygiene. He was without health insurance previously and so did not pursue evaluation or treatment until this time.  The patient was seen by Dr. Hilarie Fredrickson for a colonoscopy and biopsy on 10/09/16, revealing a large partially obstructing low rectal mass, prolapsing to anal verge. Biopsy showed adenocarcinoma. CT C/A/P on 10/13/16 revealed a large, irregular, enhancing mass consistent with known rectal cancer. There were several suspicious small perirectal and small pelvic sidewall lymph nodes. Prominent perirectal vessels were also noted. MRI of the pelvis on 10/24/16 showed a large mass centered at the anus with extension into the low rectum without obstruction. There was suspicious mild mesorectal adenopathy, and a right inguinal hernia containing nonobstructive small bowel.   The patient comes to the clinic for consultation today accompanied by his partner and daughter. He denies abdominal cramps, nausea, bowel or bladder concerns. He reports taking OTC stool softener as needed. He reports his appetite is good, but he has lost 12-15 lbs in the past few weeks. His daughter notes she has hemochromatosis and questions if this could be playing a role in the patient's anemia.  CURRENT THERAPY:  -FOFIRINOXI with dose reduction plus panitumumab every 2 weeks starting on 01/31/18   INTERIM HISTORY:  David Clarke is a 62 y.o. male who presents for follow up after palliative radiation and to start Cycle 6 FOLFIRINOX. He presents to the clinic today with his wife.  Since his last cycle, his rash has improved with no  recent outbreaks. He is on Clindamycin every other day for the rash, had not used topical cortisol. He has lost weight and appetite. His hands are dry, no cracks. He has a bitter taste in his mouth and feels sensitive to acidic foods and drinks. He recently experienced heartburn. He denies nausea. He tries to maintain a high protein diet.   No longer uses pain medications. He uses Gabapentin for nerve pain and sleep. Mirtazapine advised for appetite and sleep, and he agreed to try.       MEDICAL HISTORY:  Past Medical History:  Diagnosis Date  . Allergy   . Cancer ([place]) 10/09/2016   rectal adenocarcinoma  . Inguinal hernia     SURGICAL HISTORY: Past Surgical History:  Procedure Laterality Date  . COLONOSCOPY W/ BIOPSIES Bilateral 10/09/2016   rectal adenocarcinoma  . IR FLUORO GUIDE PORT INSERTION RIGHT  11/13/2017  . IR US GUIDE VASC ACCESS RIGHT  11/13/2017  . MOUTH SURGERY     age 44, to remove extra "eye" teeth  . [place] teeth extraction    . XI ROBOT ABDOMINAL PERINEAL RESECTION N/A 02/21/2017   Procedure: XI ROBOT ABDOMINOPERINEAL RESECTION WITH COLOSTOMY;  Surgeon: Michael Boston, MD;  Location: [facility];  Service: General;  Laterality: N/A;  SOCIAL HISTORY: Social History   Socioeconomic History  . Marital status: Soil scientist    Spouse name: Not on file  . Number of children: 1  . Years of education: Not on file  . Highest education level: Not on file  Occupational History  . Occupation: Architectural technologist  Social Needs  . Financial resource strain: Not on file  . Food insecurity:    Worry: Not on file    Inability: Not on file  . Transportation needs:    Medical: Not on file    Non-medical: Not on file  Tobacco Use  . Smoking status: Former Smoker    Years: 2.00    Types: Cigarettes    Last attempt to quit: 2002    Years since quitting: 17.4  . Smokeless tobacco: Never Used  Substance and Sexual Activity  . Alcohol use: Not Currently     Alcohol/week: 6.0 oz    Types: 4 Glasses of wine, 6 Cans of beer per week    Comment: beer or a glass or wine most days of the week  . Drug use: No  . Sexual activity: Yes    Partners: Female  Lifestyle  . Physical activity:    Days per week: Not on file    Minutes per session: Not on file  . Stress: Not on file  Relationships  . Social connections:    Talks on phone: Not on file    Gets together: Not on file    Attends religious service: Not on file    Active member of club or organization: Not on file    Attends meetings of clubs or organizations: Not on file    Relationship status: Not on file  . Intimate partner violence:    Fear of current or ex partner: Not on file    Emotionally abused: Not on file    Physically abused: Not on file    Forced sexual activity: Not on file  Other Topics Concern  . Not on file  Social History Narrative  . Not on file    FAMILY HISTORY: Family History  Problem Relation Age of Onset  . Other Mother        bile duct tumor  . Stroke Mother   . Cancer Mother 40       bile duct cancer   . Prostate cancer Father 43  . Emphysema Father   . Colon cancer Neg Hx   . Esophageal cancer Neg Hx   . Pancreatic cancer Neg Hx   . Rectal cancer Neg Hx   . Stomach cancer Neg Hx     ALLERGIES:  is allergic to sulfa antibiotics.  MEDICATIONS:  Current Outpatient Medications  Medication Sig Dispense Refill  . clindamycin (CLINDAGEL) 1 % gel Apply topically 2 (two) times daily. 60 g 1  . Cyanocobalamin (VITAMIN B 12 PO) Take 1 tablet by mouth daily.    Marland Kitchen dexamethasone (DECADRON) 4 MG tablet Take 2 tablets (8 mg total) by mouth daily. Start the day after chemo for 2 days. 8 tablet 5  . doxycycline (VIBRA-TABS) 100 MG tablet Take 1 tablet (100 mg total) by mouth 2 (two) times daily. 60 tablet 2  . gabapentin (NEURONTIN) 100 MG capsule Take 1 capsule (100 mg total) by mouth at bedtime. 60 capsule 0  . lidocaine-prilocaine (EMLA) cream Apply 1  application topically as needed. 30 g 2  . lidocaine-prilocaine (EMLA) cream Apply to affected area once 30 g 3  . loperamide (IMODIUM  A-D) 2 MG tablet Take 1 tablet (2 mg total) by mouth 4 (four) times daily as needed. Take 2 at diarrhea onset , then 1 every 2hr until 12hrs with no BM. May take 2 every 4hrs at night. If diarrhea recurs repeat. 100 tablet 1  . LORazepam (ATIVAN) 1 MG tablet Take 1 tablet (1 mg total) by mouth every 6 (six) hours as needed (NAUSEA). 30 tablet 0  . Multiple Vitamin (MULTIVITAMIN WITH MINERALS) TABS tablet Take 1 tablet by mouth every other day.     . nystatin (MYCOSTATIN) 100000 UNIT/ML suspension Take 5 mLs (500,000 Units total) by mouth 4 (four) times daily. 473 mL 0  . ondansetron (ZOFRAN) 8 MG tablet Take 1 tablet (8 mg total) by mouth 2 (two) times daily as needed for refractory nausea / vomiting. Start on day 3 after chemotherapy. 30 tablet 1  . prochlorperazine (COMPAZINE) 10 MG tablet Take 1 tablet (10 mg total) by mouth every 6 (six) hours as needed (NAUSEA). 30 tablet 1  . ranitidine (ZANTAC) 150 MG tablet Take 150 mg by mouth 2 (two) times daily.    Marland Kitchen acetaminophen (TYLENOL) 500 MG tablet Take 500 mg by mouth every 6 (six) hours as needed.    . mirtazapine (REMERON) 7.5 MG tablet Take 1 tablet (7.5 mg total) by mouth at bedtime. 30 tablet 0  . omeprazole (PRILOSEC) 40 MG capsule Take 1 capsule (40 mg total) by mouth daily. 30 capsule 2  . oxyCODONE-acetaminophen (PERCOCET/ROXICET) 5-325 MG tablet Take 1-2 tablets by mouth every 6 (six) hours as needed for severe pain. (Patient not taking: Reported on 02/14/2018) 40 tablet 0  . potassium chloride SA (K-DUR,KLOR-CON) 20 MEQ tablet Take 1 tablet (20 mEq total) by mouth 2 (two) times daily. 60 tablet 1  . traMADol (ULTRAM) 50 MG tablet Take 1-2 tablets (50-100 mg total) by mouth every 8 (eight) hours as needed. (Patient not taking: Reported on 02/14/2018) 60 tablet 0   Current Facility-Administered Medications    Medication Dose Route Frequency Provider Last Rate Last Dose  . 0.9 %  sodium chloride infusion  500 mL Intravenous Continuous Pyrtle, Lajuan Lines, MD      . 0.9 %  sodium chloride infusion  500 mL Intravenous Once Pyrtle, Lajuan Lines, MD        REVIEW OF SYSTEMS: Constitutional: Denies fevers, chills or abnormal night sweats (+) loss of appetite, weight loss, and taste change Eyes: Denies blurriness of vision, double vision or watery eyes Ears, nose, mouth, throat, and face: Denies mucositis or sore throat  Respiratory: Denies cough, dyspnea or wheezes Cardiovascular: Denies palpitation, chest discomfort  Gastrointestinal:  Denies nausea, (+) heartburn and change in bowel habits (+) colostomy bag  Skin: (+) rash on face and neck, improving (+) dry hands  Lymphatics: Denies new lymphadenopathy or easy bruising MSK:  (+) right glute pain and right mid-thigh numbness, with limp, much improved (+) ambulates with cane Neurological: (+) improved neuropathy, mild   Behavioral/Psych: Mood is stable, no new changes  All other systems were reviewed with the patient and are negative.   PHYSICAL EXAMINATION:   ECOG PERFORMANCE STATUS: 2  Vitals:   02/14/18 0854  BP: 116/84  Pulse: 87  Resp: 18  Temp: 97.6 F (36.4 C)  SpO2: 100%   Filed Weights   02/14/18 0854  Weight: 125 lb 14.4 oz (57.1 kg)     GENERAL: alert, no distress and comfortable. (+) Cane for ambulation SKIN: skin color, texture, turgor are normal, (+)  diffuse acne-like rash on his face, neck and upper chest, no diffuse skin erythema. NECK: supple, thyroid normal size, non-tender, without nodularity LYMPH: (+) 2x 1.5 cm left supraclavicular lymph node is now barely palpable, non-tender LUNGS: clear to auscultation and percussion with normal breathing effort HEART: regular rate & rhythm and no murmurs and no lower extremity edema ABDOMEN: (+) grapefruit size mass palpated in the RLQ, has imporoved. abdomen soft, non-tender and normal  bowel sounds (+) laparoscopic incisions all healed Musculoskeletal: no cyanosis of digits and no clubbing  PSYCH: alert & oriented x 3 with fluent speech NEURO: no focal motor/sensory deficits. (+) mild weakness upon lifting right leg.   LABORATORY DATA:  I have reviewed the data as listed CBC Latest Ref Rng & Units 02/14/2018 01/31/2018 01/17/2018  WBC 4.0 - 10.3 K/uL 1.8(L) 4.4 5.2  Hemoglobin 13.0 - 17.1 g/dL 10.9(L) 10.6(L) 10.7(L)  Hematocrit 38.4 - 49.9 % 31.8(L) 31.1(L) 32.2(L)  Platelets 140 - 400 K/uL 317 301 381   CMP Latest Ref Rng & Units 02/14/2018 01/31/2018 01/17/2018  Glucose 70 - 140 mg/dL 134 104 143(H)  BUN 7 - 26 mg/dL '9 13 15  '$ Creatinine 0.70 - 1.30 mg/dL 0.71 0.68(L) 0.72  Sodium 136 - 145 mmol/L 134(L) 135(L) 132(L)  Potassium 3.5 - 5.1 mmol/L 2.9(LL) 3.5 3.5  Chloride 98 - 109 mmol/L 99 101 99  CO2 22 - 29 mmol/L '25 25 24  '$ Calcium 8.4 - 10.4 mg/dL 8.9 8.7 9.0  Total Protein 6.4 - 8.3 g/dL 6.4 6.4 7.5  Total Bilirubin 0.2 - 1.2 mg/dL 0.5 0.5 0.8  Alkaline Phos 40 - 150 U/L 144 121 149  AST 5 - 34 U/L '17 18 17  '$ ALT 0 - 55 U/L '9 6 12   '$ ANC 0.9K      CEA ([facility])  10/27/2016: 7.81 11/27/2016: 5.56 01/10/2017: 5.61 03/14/17: 5.11 04/11/17: 5.87 05/23/17: 8.79 06/19/17:  9.15 07/16/17:  9.88 08/10/17: 10.40 09/18/17: 8.31 10/30/17: 8.11 11/20/17: 7.96 12/25/2017: 13.95 01/17/18: 10.68   PATHOLOGY:  10/29/17 Diagnosis Lymph node, needle/core biopsy, left supraclavicular - METASTATIC ADENOCARCINOMA, CONSISTENT WITH COLORECTAL PRIMARY. - SEE COMMENT. Microscopic Comment The malignant cells are positive for CDX-2 and cytokeratin 20. They are negative for cytokeratin 7, p63 and cytokeratin 5/6. The findings are consistent with primary colorectal adenocarcinoma. Dr. Thressa Sheller has reviewed the case and concurs with this interpretation. Dr. Burr Medico was paged on 10/30/17. Additional studies can be performed upon clinician request. (JBK:gt, 10/31/17)   Diagnosis  02/21/17 1. Colon, segmental resection for tumor, rectosigmoid - INVASIVE ADENOCARCINOMA, MODERATELY DIFFERENTIATED, SPANNING 3.9 CM. - TUMOR INVADES THROUGH MUSCULARIS PROPRIA. - RESECTION MARGINS ARE NEGATIVE. - EIGHT OF EIGHT LYMPH NODES NEGATIVE FOR CARCINOMA (0/8), SEE COMMENT. - SEE ONCOLOGY TABLE. 2. Soft tissue, biopsy, left lateral pelvic floor - BENIGN SKELETAL MUSCLE AND SOFT TISSUE. - NO MALIGNANCY IDENTIFIED. 3. Colon, segmental resection, proximal sigmoid - BENIGN COLONIC MUCOSA. - NO DYSPLASIA OR MALIGNANCY. 4. Liver, biopsy, liver mass - BENIGN VASCULAR PROLIFERATION CONSISTENT WITH HEMANGIOMA. - NO MALIGNANCY IDENTIFIED. Microscopic Comment 1. COLON AND RECTUM (INCLUDING TRANS-ANAL RESECTION): Specimen: Rectosigmoid colon with anus. Procedure: Abdominoperineal resection. Tumor site: Distal 1/3 rectum to anus. Specimen integrity: Intact. Macroscopic intactness of mesorectum: Incomplete. Macroscopic tumor perforation: Not identified. Invasive tumor: Maximum size: 3.9 cm Histologic type(s): Invasive adenocarcinoma. Histologic grade and differentiation: G2: moderately differentiated/low grade Type of polyp in which invasive carcinoma arose: N/A. Microscopic extension of invasive tumor: Tumor invades through muscularis propria. Lymph-Vascular invasion: Not identified. 1  of 3 FINAL for David Clarke, David Clarke ([id number]) Microscopic Comment(continued) Peri-neural invasion: Present. Tumor deposit(s) (discontinuous extramural extension): Not identified. Resection margins: Proximal margin: Negative. Distal margin: Negative. Circumferential (radial) (posterior ascending, posterior descending; lateral and posterior mid-rectum; and entire lower 1/3 rectum): Negative. Mesenteric margin (sigmoid and transverse): Negative. Distance closest margin (if all above margins negative): <0.1 cm radial margin. >1 cm distal margin. Treatment effect (neo-adjuvant therapy): Present Additional  polyp(s): N/A. Non-neoplastic findings: None. Lymph nodes: number examined 8; number positive: 0 (treatment effect present). Pathologic Staging: ypT3, ypN0, ypMX Ancillary studies: Can be performed upon request. Comment: The fat was cleared and additional lymph nodes were not identified.  Diagnosis 10/09/2016 Rectum, biopsy, mass - ADENOCARCINOMA.  RADIOGRAPHIC STUDIES: I have personally reviewed the radiological images as listed and agreed with the findings in the report.  CT CAP with contrast, 01/08/2018 IMPRESSION: 1. Today's study demonstrates dramatic progression of disease with interval development of widespread pulmonary metastases, multiple new liver lesions and progressive lymphadenopathy noted in the pelvis, retroperitoneum and left supraclavicular nodal stations. In addition, the previously noted metastatic lesion in the right psoas muscle has markedly increased in size. Whether or not this reflects growth of the lesion and/or internal hemorrhage of the lesion is uncertain. 2. Stable postoperative thickening and small postoperative fluid collection in the presacral space, as above, following abdominal perineal resection. 3. Large right inguinal hernia containing several loops of small bowel, without evidence of bowel incarceration or obstruction at this time.  PET Scan 10/17/17 IMPRESSION: Increased hypermetabolic lymphadenopathy in abdominal retroperitoneum, right common iliac chain, and left supraclavicular region, consistent with metastatic disease. Stable tiny bilateral pulmonary nodules show no FDG uptake but are too small to characterize by PET. Recommend continued attention on follow-up CT. 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   CT CAP W Contrast 09/18/17 IMPRESSION: Interval abdomino-peritoneal resection with left lower quadrant colostomy. Increased diffuse cystitis, likely due to radiation. New presacral soft tissue  density with central low attenuation, also suspicious for post treatment changes. Recommend continued attention on follow-up imaging. New mild retroperitoneal lymphadenopathy in the aortocaval space, measuring up to 1.8 cm. This is suspicious for metastatic disease, although it is conceivable that this could be reactive in etiology due to interval surgery and radiation therapy. Recommend short-term follow-up by CT in 3 months. New indeterminate 4 mm left upper lobe pulmonary nodule, likely inflammatory in etiology with metastatic disease considered less likely. Recommend continued attention on follow-up CT. Bilateral inguinal hernias, largest on the right containing several small bowel loops.   Colonoscopy 08/15/17 IMPRESSION - Patent end colostomy with healthy appearing mucosa in the sigmoid colon. - The entire examined colon is normal. - No specimens collected.  MRI Pelvis W WO Contrast 01/29/17 IMPRESSION: Significant decrease in size of soft tissue mass at the anorectal junction, with post radiation changes. No residual perirectal lymphadenopathy or other signs of pelvic metastatic disease. Stable small right inguinal hernia containing small bowel.  CT A/P W Contrast 12/07/16 IMPRESSION: 1. Abnormal dilatation of the proximal small bowel loops worrisome for either small bowel obstruction versus small bowel ileus. 2. 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 3. Large right inguinal hernia containing edematous loop of distal small bowel 4. No pneumatosis or bowel perforation.  No abscess. 5. Aortic atherosclerosis 6. Mild decrease in size of rectal mass. No change in right common iliac adenopathy.  DG Chest 2 View 12/03/2016  IMPRESSION: 1. No active cardiopulmonary disease. No evidence of pneumonia or pulmonary edema. No evidence of intrathoracic metastasis. 2.  Hyperexpanded lungs suggesting COPD.  MRI Pelvis 10/24/2016 IMPRESSION: 1. Moderately motion degraded exam. 2. Large mass is centered at the anus with extension into the low rectum. No complicating obstruction. 3. Mild mesorectal adenopathy, suspicious. 4. Right inguinal hernia containing nonobstructive small bowel.  CT CAP w Contrast 10/13/2016 IMPRESSION: Large irregular enhancing rectal mass consistent with known rectal cancer. There are small perirectal and small pelvic sidewall lymph nodes which are suspicious. Prominent perirectal vessels are also noted. No evidence of metastatic retroperitoneal or sigmoid mesocolon adenopathy or hepatic or pulmonary metastasis.  COLONOSCOPY 10/09/2016 Dr. Hilarie Fredrickson  A fungating, infiltrative and ulcerated partially obstructing large mass was found in the distal rectum. The mass was circumferential. The mass measured seven cm in length (prolapsing and from dentate line to approx 7 cm). In addition, its inner diameter measured nine mm. Oozing was present. This was biopsied with a cold forceps for histology. - Due to the distal rectal mass the bowel preparation was poor and unable to be cleared via the adult upper endoscope (adult upper endoscope used due to obstructing mass). Scope not advanced proximal to the rectosigmoid colon.  ASSESSMENT & PLAN: David Clarke 62 y.o.  gentleman, previously healthy, presented with an anal mass and a mild intermittent rectal bleeding for 5-6 months. Fatigue, anorexia, fatigue and weight loss for a month. He is currently on radiation for his rectal cancer, chemotherapy has been held lately.  1. Rectal Adenocarcinoma, low rectum, [id number], ypT3N0, stage IIIB, MSI-stable, nodes metastasis 09/2017, KRAS/NRAS/BRAF wild type  -I previously reviewed his CT and MRI scan, colonoscopy and biopsy findings in detail with pt and his family members -Based on his imaging findings, he had clinical stage III disease. We reviewed his  image in our GI tumor Board, he also has a small liver lesion which is indeterminate, likely benign.  -patient received neoadjuvant chemotherapy and radiation with Xeloda 10/1416-12/19/16, which was held for the last few weeks due to infectious colitis. -He underwent APR on 02/21/2017, surgical margins were negative, all lymph nodes were negative. We've reviewed his surgical pathology findings he did have significant treatment effect on his surgical sample.  -He recovered well from surgery, started adjuvant CAPOX 04/04/17. He completed 5 cycles of oxaliplatin on 07/16/17 and completed Xeloda on 08/12/17. His oxaliplatin treatment was postponed a few times due to his working schedule -He unfortunately had recurrence in 09/2017 -I discussed the treatment option. Given his rapid Recurrence (2 months) after completion of adjuvant Capox, I recommend FOLFIRI as his next line therapy. -He went to [facility] for 2nd opinion with Dr. Aleatha Borer on 3/18 and understands he is not a candidate for clinical trial [id number] at [facility] and chose to proceed with current chemotherapy plan.  -He started FOLFIRI on 11/20/17 and has tolerated first cycle well.  -His FO genomic testing showed MSI stable disease, K-ras, NRAS, BRAF wild-type, no other targetable mutations.  Panitumumab was added on from cycle 3.  -I previously reviewed his 01/08/18 restaging CT scan findings with patient and his family, unfortunately he has dramatic progression of the right metastatic lesion in the right psoas muscle which is 13.9 cm, and he also developed new pulmonary, liver metastasis. We discussed this could be partially related to his delay of treatment, and he has had only 3 cycle chemo, 1 cycle of panitumumab. Unfortunately the biology of his tumor is extremely aggressive, I recommend him  to consider more intense chemotherapy FOLFIRINOX every 2 weeks and continue panitumumab -Due to his worsening right flank pain, he received palliative radiation to the right  psoas muscle mass.  His pain has significantly improved after radiation. -He tolerated first cycle FOLFIRINOX with dose reduction well overall, but has lost pounds.  I encouraged him to take more high-calorie and high-protein diet.  He does not think he needs to see dietitian. -Lab review, he has developed neutropenia with ANC 0.9 today.  He was supposed to get Neulasta on day 3 last cycle, but he did not receive it.  Plan to start Neulasta on day 3 from the cycle -We will proceed cycle 2 FOLFIRINOX today, with first dose reduction of oxaliplatin.  If he has no significant neutropenia with Neulasta, I plan to increase 5-FU dose (change to [facility]) if he can tolerate  -f/u in 2 weeks  -plan to repeat staging CT in early August   2. 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 This was confirmed by his iron study. -His daughter was diagnosed with hemochromatosis (homozygous), his genetic testing showed he is a carrier -He received IV iron once, he previously took some oral iron, I'll be cautious about IV iron replacement due to his hemachromatosis carrier status. -CBC and iron level were closely monitored. -Hg at 10.9 today (02/14/18)  3. Peripheral neuropathy, secondary to chemo, G1  -mild numbness, no decreased sensation on exam, hand function is normal  -He had slightly more noticeable neuropathy, but hand functions are normal and adequate for treatment -He had taken B complex and his neuropathy had improved -continue Neurontin  4.Goal of care discussion  -We again discussed the incurable nature of his cancer, and the overall poor prognosis due to the metastatic disease, especially if he does not have good response to chemotherapy or progress on chemo -The patient understands the goal of care is  palliative, to prolong his life. -He is full code for now   5. Right sacral and anterolateral upper leg pain with associated numbness to mid-thigh   -Likely secondary to right iliac adenopathy and psoas involvement. Has been taking tramadol 2 tabs BID and supplementing with tylenol as well as integrating non-pharm measures.  -Pain is well managed but interfering with sleep. His work requires him to be on his feet for prolonged periods. He does not like "fog" and constipation effects of tramadol, therefore not interested in escalating opioids at this time.  -Pain much improved after palliative radiation. He no longer is taking oxycodone, just Tylenol and CBD oil pen.  -I recommend physical therapy to move her balance and strength, he agrees.  6. Constipation  -He uses Senakot and miralax PRN and will use magnesium citrate as a back up if he still has no relief.  -He understands indications for using laxative vs anti-diarrheal if he develops change in his bowel habits.  -I previously encouraged him to increase water intake if he develops diarrhea and call clinic if not controlled by imodium -Constipation resolved, likely due to chemotherapy. Bowel habits overall regular.  7. Rash on face and neck, secondary to panitumumab  -Secondary to Vectibix -He will continue Clindamycin gel and hydrocortisone cream. I previously prescribed doxycycline to help control his rash.  -I previously recommended he uses a hat and sunscreen when out in the sun.  -Rash  improved with oral doxycycline and topical treatments.    8.Weight loss -Secondary to chemo. And associated with loss of appetite. -He agrees to increase his calorie intake -He has a dietitian friend, does not feel needing to meet our dietitian  9. Hypokalemia - on 6/20.2019, it was 2.9 -Was given potassium during infusion today, 75mq. - Oral potassium248m is called in, he will take twice daily   Plan: - PT referral  -will proceed cycle 2  FOLFIRINOX today with oxaliplatin and irinotecan dose reduction and Neulasta on day 3 -Return in 2 weeks for cycle 3 chemo, may increase 5-fu dose if neutropenia resolves   All questions were answered. The patient knows to call the clinic with any problems, questions or concerns.  I spent 30 minutes counseling the patient face to face. The total time spent in the appointment was 40 minutes and more than 50% was on counseling.  I,Dierdre Searlesweik am acting as scribe for Dr. YaTruitt Merle I have reviewed the above documentation for accuracy and completeness, and I agree with the above.    YaTruitt MerleMD 02/14/2018

## 2018-02-14 ENCOUNTER — Inpatient Hospital Stay: Payer: BLUE CROSS/BLUE SHIELD

## 2018-02-14 ENCOUNTER — Encounter: Payer: Self-pay | Admitting: Hematology

## 2018-02-14 ENCOUNTER — Inpatient Hospital Stay (HOSPITAL_BASED_OUTPATIENT_CLINIC_OR_DEPARTMENT_OTHER): Payer: BLUE CROSS/BLUE SHIELD | Admitting: Hematology

## 2018-02-14 VITALS — BP 116/84 | HR 87 | Temp 97.6°F | Resp 18 | Ht 72.0 in | Wt 125.9 lb

## 2018-02-14 DIAGNOSIS — G620 Drug-induced polyneuropathy: Secondary | ICD-10-CM | POA: Diagnosis not present

## 2018-02-14 DIAGNOSIS — Z148 Genetic carrier of other disease: Secondary | ICD-10-CM

## 2018-02-14 DIAGNOSIS — C20 Malignant neoplasm of rectum: Secondary | ICD-10-CM

## 2018-02-14 DIAGNOSIS — Z87891 Personal history of nicotine dependence: Secondary | ICD-10-CM

## 2018-02-14 DIAGNOSIS — R634 Abnormal weight loss: Secondary | ICD-10-CM | POA: Diagnosis not present

## 2018-02-14 DIAGNOSIS — C770 Secondary and unspecified malignant neoplasm of lymph nodes of head, face and neck: Secondary | ICD-10-CM

## 2018-02-14 DIAGNOSIS — C787 Secondary malignant neoplasm of liver and intrahepatic bile duct: Secondary | ICD-10-CM | POA: Diagnosis not present

## 2018-02-14 DIAGNOSIS — C7800 Secondary malignant neoplasm of unspecified lung: Secondary | ICD-10-CM | POA: Diagnosis not present

## 2018-02-14 DIAGNOSIS — M79604 Pain in right leg: Secondary | ICD-10-CM

## 2018-02-14 DIAGNOSIS — T451X5A Adverse effect of antineoplastic and immunosuppressive drugs, initial encounter: Secondary | ICD-10-CM

## 2018-02-14 DIAGNOSIS — D500 Iron deficiency anemia secondary to blood loss (chronic): Secondary | ICD-10-CM

## 2018-02-14 DIAGNOSIS — R630 Anorexia: Secondary | ICD-10-CM

## 2018-02-14 DIAGNOSIS — C7989 Secondary malignant neoplasm of other specified sites: Secondary | ICD-10-CM | POA: Diagnosis not present

## 2018-02-14 DIAGNOSIS — R21 Rash and other nonspecific skin eruption: Secondary | ICD-10-CM | POA: Diagnosis not present

## 2018-02-14 DIAGNOSIS — D709 Neutropenia, unspecified: Secondary | ICD-10-CM | POA: Diagnosis not present

## 2018-02-14 DIAGNOSIS — E876 Hypokalemia: Secondary | ICD-10-CM | POA: Diagnosis not present

## 2018-02-14 LAB — CBC WITH DIFFERENTIAL/PLATELET
Basophils Absolute: 0 10*3/uL (ref 0.0–0.1)
Basophils Relative: 2 %
EOS PCT: 2 %
Eosinophils Absolute: 0 10*3/uL (ref 0.0–0.5)
HCT: 31.8 % — ABNORMAL LOW (ref 38.4–49.9)
HEMOGLOBIN: 10.9 g/dL — AB (ref 13.0–17.1)
LYMPHS ABS: 0.3 10*3/uL — AB (ref 0.9–3.3)
Lymphocytes Relative: 16 %
MCH: 34 pg — AB (ref 27.2–33.4)
MCHC: 34.3 g/dL (ref 32.0–36.0)
MCV: 99 fL — ABNORMAL HIGH (ref 79.3–98.0)
MONO ABS: 0.6 10*3/uL (ref 0.1–0.9)
MONOS PCT: 31 %
NEUTROS PCT: 49 %
Neutro Abs: 0.9 10*3/uL — ABNORMAL LOW (ref 1.5–6.5)
Platelets: 317 10*3/uL (ref 140–400)
RBC: 3.21 MIL/uL — ABNORMAL LOW (ref 4.20–5.82)
RDW: 16.7 % — AB (ref 11.0–14.6)
WBC: 1.8 10*3/uL — ABNORMAL LOW (ref 4.0–10.3)

## 2018-02-14 LAB — CEA (IN HOUSE-CHCC): CEA (CHCC-In House): 7.17 ng/mL — ABNORMAL HIGH (ref 0.00–5.00)

## 2018-02-14 LAB — COMPREHENSIVE METABOLIC PANEL
ALBUMIN: 2.9 g/dL — AB (ref 3.5–5.0)
ALK PHOS: 144 U/L (ref 40–150)
ALT: 9 U/L (ref 0–55)
AST: 17 U/L (ref 5–34)
Anion gap: 10 (ref 3–11)
BUN: 9 mg/dL (ref 7–26)
CALCIUM: 8.9 mg/dL (ref 8.4–10.4)
CHLORIDE: 99 mmol/L (ref 98–109)
CO2: 25 mmol/L (ref 22–29)
CREATININE: 0.71 mg/dL (ref 0.70–1.30)
GFR calc non Af Amer: >60 mL/min (ref >60)
GLUCOSE: 134 mg/dL (ref 70–140)
Potassium: 2.9 mmol/L — CL (ref 3.5–5.1)
Sodium: 134 mmol/L — ABNORMAL LOW (ref 136–145)
Total Bilirubin: 0.5 mg/dL (ref 0.2–1.2)
Total Protein: 6.4 g/dL (ref 6.4–8.3)

## 2018-02-14 LAB — IRON AND TIBC
Iron: 46 ug/dL (ref 42–163)
Saturation Ratios: 18 % — ABNORMAL LOW (ref 42–163)
TIBC: 251 ug/dL (ref 202–409)
UIBC: 205 ug/dL

## 2018-02-14 LAB — MAGNESIUM: Magnesium: 1.7 mg/dL (ref 1.7–2.4)

## 2018-02-14 LAB — FERRITIN: FERRITIN: 498 ng/mL — AB (ref 22–316)

## 2018-02-14 MED ORDER — SODIUM CHLORIDE 0.9% FLUSH
3.0000 mL | Freq: Once | INTRAVENOUS | Status: AC | PRN
  Administered 2018-02-14: 10 mL via INTRAVENOUS
  Filled 2018-02-14: qty 10

## 2018-02-14 MED ORDER — POTASSIUM CHLORIDE CRYS ER 20 MEQ PO TBCR: 20.0000 meq | EXTENDED_RELEASE_TABLET | Freq: Two times a day (BID) | ORAL | 1 refills | Status: DC

## 2018-02-14 MED ORDER — FOSAPREPITANT DIMEGLUMINE INJECTION 150 MG
Freq: Once | INTRAVENOUS | Status: AC
  Administered 2018-02-14: 11:00:00 via INTRAVENOUS
  Filled 2018-02-14: qty 5

## 2018-02-14 MED ORDER — IRINOTECAN HCL CHEMO INJECTION 100 MG/5ML
130.0000 mg/m2 | Freq: Once | INTRAVENOUS | Status: AC
  Administered 2018-02-14: 220 mg via INTRAVENOUS
  Filled 2018-02-14: qty 11

## 2018-02-14 MED ORDER — MIRTAZAPINE 7.5 MG PO TABS: 7.5000 mg | ORAL_TABLET | Freq: Every day | ORAL | 0 refills | Status: DC

## 2018-02-14 MED ORDER — PALONOSETRON HCL INJECTION 0.25 MG/5ML
0.2500 mg | Freq: Once | INTRAVENOUS | Status: AC
  Administered 2018-02-14: 0.25 mg via INTRAVENOUS

## 2018-02-14 MED ORDER — PALONOSETRON HCL INJECTION 0.25 MG/5ML
INTRAVENOUS | Status: AC
  Filled 2018-02-14: qty 5

## 2018-02-14 MED ORDER — OMEPRAZOLE 40 MG PO CPDR: 40.0000 mg | DELAYED_RELEASE_CAPSULE | Freq: Every day | ORAL | 2 refills | Status: DC

## 2018-02-14 MED ORDER — POTASSIUM CHLORIDE CRYS ER 20 MEQ PO TBCR
40.0000 meq | EXTENDED_RELEASE_TABLET | Freq: Once | ORAL | Status: AC
  Administered 2018-02-14: 40 meq via ORAL

## 2018-02-14 MED ORDER — DEXTROSE 5 % IV SOLN
Freq: Once | INTRAVENOUS | Status: AC
  Administered 2018-02-14: 13:00:00 via INTRAVENOUS

## 2018-02-14 MED ORDER — LEUCOVORIN CALCIUM INJECTION 350 MG: 400.0000 mg/m2 | Freq: Once | INTRAVENOUS | Status: DC

## 2018-02-14 MED ORDER — FLUOROURACIL CHEMO INJECTION 5 GM/100ML
2200.0000 mg/m2 | INTRAVENOUS | Status: DC
  Administered 2018-02-14: 3750 mg via INTRAVENOUS
  Filled 2018-02-14: qty 75

## 2018-02-14 MED ORDER — SODIUM CHLORIDE 0.9 % IV SOLN: 6.0000 mg/kg | Freq: Once | INTRAVENOUS | Status: DC

## 2018-02-14 MED ORDER — IRINOTECAN HCL CHEMO INJECTION 100 MG/5ML
130.0000 mg/m2 | Freq: Once | INTRAVENOUS | Status: DC
  Filled 2018-02-14: qty 12

## 2018-02-14 MED ORDER — OXALIPLATIN CHEMO INJECTION 100 MG/20ML: 60.0000 mg/m2 | Freq: Once | INTRAVENOUS | Status: DC

## 2018-02-14 MED ORDER — SODIUM CHLORIDE 0.9 % IV SOLN: 2200.0000 mg/m2 | INTRAVENOUS | Status: DC

## 2018-02-14 MED ORDER — POTASSIUM CHLORIDE CRYS ER 20 MEQ PO TBCR
EXTENDED_RELEASE_TABLET | ORAL | Status: AC
  Filled 2018-02-14: qty 2

## 2018-02-14 MED ORDER — OXALIPLATIN CHEMO INJECTION 100 MG/20ML
60.0000 mg/m2 | Freq: Once | INTRAVENOUS | Status: AC
  Administered 2018-02-14: 100 mg via INTRAVENOUS
  Filled 2018-02-14: qty 20

## 2018-02-14 MED ORDER — PANITUMUMAB CHEMO INJECTION 100 MG/5ML
6.0000 mg/kg | Freq: Once | INTRAVENOUS | Status: AC
  Administered 2018-02-14: 340 mg via INTRAVENOUS
  Filled 2018-02-14: qty 17

## 2018-02-14 MED ORDER — SODIUM CHLORIDE 0.9 % IV SOLN
Freq: Once | INTRAVENOUS | Status: AC
  Administered 2018-02-14: 10:00:00 via INTRAVENOUS

## 2018-02-14 MED ORDER — LEUCOVORIN CALCIUM INJECTION 350 MG
400.0000 mg/m2 | Freq: Once | INTRAVENOUS | Status: AC
  Administered 2018-02-14: 680 mg via INTRAVENOUS
  Filled 2018-02-14: qty 34

## 2018-02-14 NOTE — Progress Notes (Signed)
Ok to proceed with 02/14/18 lab results per Dr. Sharon Mt, RN.

## 2018-02-14 NOTE — Patient Instructions (Signed)
[facility] Discharge Instructions for Patients Receiving Chemotherapy  Today you received the following chemotherapy agents Oxaliplatin, Irinotecan, Leucovorin, 5FU, Vectibix  To help prevent nausea and vomiting after your treatment, we encourage you to take your nausea medication as directed   If you develop nausea and vomiting that is not controlled by your nausea medication, call the clinic.   BELOW ARE SYMPTOMS THAT SHOULD BE REPORTED IMMEDIATELY:  *FEVER GREATER THAN 100.5 F  *CHILLS WITH OR WITHOUT FEVER  NAUSEA AND VOMITING THAT IS NOT CONTROLLED WITH YOUR NAUSEA MEDICATION  *UNUSUAL SHORTNESS OF BREATH  *UNUSUAL BRUISING OR BLEEDING  TENDERNESS IN MOUTH AND THROAT WITH OR WITHOUT PRESENCE OF ULCERS  *URINARY PROBLEMS  *BOWEL PROBLEMS  UNUSUAL RASH Items with * indicate a potential emergency and should be followed up as soon as possible.  Feel free to call the clinic should you have any questions or concerns. The clinic phone number is [phone].  Please show the [facility] at check-in to the Emergency Department and triage nurse.   Hypokalemia Hypokalemia means that the amount of potassium in the blood is lower than normal.Potassium is a chemical that helps regulate the amount of fluid in the body (electrolyte). It also stimulates muscle tightening (contraction) and helps nerves work properly.Normally, most of the body's potassium is inside of cells, and only a very small amount is in the blood. Because the amount in the blood is so small, minor changes to potassium levels in the blood can be life-threatening. What are the causes? This condition may be caused by:  Antibiotic medicine.  Diarrhea or vomiting. Taking too much of a medicine that helps you have a bowel movement (laxative) can cause diarrhea and lead to hypokalemia.  Chronic kidney disease (CKD).  Medicines that help the body get rid of excess fluid (diuretics).  Eating  disorders, such as bulimia.  Low magnesium levels in the body.  Sweating a lot.  What are the signs or symptoms? Symptoms of this condition include:  Weakness.  Constipation.  Fatigue.  Muscle cramps.  Mental confusion.  Skipped heartbeats or irregular heartbeat (palpitations).  Tingling or numbness.  How is this diagnosed? This condition is diagnosed with a blood test. How is this treated? Hypokalemia can be treated by taking potassium supplements by mouth or adjusting the medicines that you take. Treatment may also include eating more foods that contain a lot of potassium. If your potassium level is very low, you may need to get potassium through an IV tube in one of your veins and be monitored in the hospital. Follow these instructions at home:  Take over-the-counter and prescription medicines only as told by your health care provider. This includes vitamins and supplements.  Eat a healthy diet. A healthy diet includes fresh fruits and vegetables, whole grains, healthy fats, and lean proteins.  If instructed, eat more foods that contain a lot of potassium, such as: ? Nuts, such as peanuts and pistachios. ? Seeds, such as sunflower seeds and pumpkin seeds. ? Peas, lentils, and lima beans. ? Whole grain and bran cereals and breads. ? Fresh fruits and vegetables, such as apricots, avocado, bananas, cantaloupe, kiwi, oranges, tomatoes, asparagus, and potatoes. ? Orange juice. ? Tomato juice. ? Red meats. ? Yogurt.  Keep all follow-up visits as told by your health care provider. This is important. Contact a health care provider if:  You have weakness that gets worse.  You feel your heart pounding or racing.  You vomit.  You have diarrhea.  You have diabetes (diabetes mellitus) and you have trouble keeping your blood sugar (glucose) in your target range. Get help right away if:  You have chest pain.  You have shortness of breath.  You have vomiting or  diarrhea that lasts for more than 2 days.  You faint. This information is not intended to replace advice given to you by your health care provider. Make sure you discuss any questions you have with your health care provider. Document Released: 08/14/2005 Document Revised: 04/01/2016 Document Reviewed: 04/01/2016 Elsevier Interactive Patient Education  2018 [employer].

## 2018-02-15 ENCOUNTER — Encounter: Payer: Self-pay | Admitting: Pharmacist

## 2018-02-15 ENCOUNTER — Telehealth: Payer: Self-pay | Admitting: Hematology

## 2018-02-15 NOTE — Telephone Encounter (Signed)
Appointment scheduled referral sent, calendar/letter mailed to patient per 6/20 los

## 2018-02-16 ENCOUNTER — Inpatient Hospital Stay: Payer: BLUE CROSS/BLUE SHIELD

## 2018-02-16 VITALS — BP 123/87 | HR 90 | Temp 97.6°F | Resp 18

## 2018-02-16 DIAGNOSIS — C20 Malignant neoplasm of rectum: Secondary | ICD-10-CM | POA: Diagnosis not present

## 2018-02-16 MED ORDER — HEPARIN SOD (PORK) LOCK FLUSH 100 UNIT/ML IV SOLN
500.0000 [IU] | Freq: Once | INTRAVENOUS | Status: AC | PRN
  Administered 2018-02-16: 500 [IU]
  Filled 2018-02-16: qty 5

## 2018-02-16 MED ORDER — PEGFILGRASTIM-CBQV 6 MG/0.6ML ~~LOC~~ SOSY
6.0000 mg | PREFILLED_SYRINGE | Freq: Once | SUBCUTANEOUS | Status: AC
  Administered 2018-02-16: 6 mg via SUBCUTANEOUS

## 2018-02-16 MED ORDER — SODIUM CHLORIDE 0.9% FLUSH
10.0000 mL | INTRAVENOUS | Status: DC | PRN
  Administered 2018-02-16: 10 mL
  Filled 2018-02-16: qty 10

## 2018-02-16 MED ORDER — PEGFILGRASTIM-CBQV 6 MG/0.6ML ~~LOC~~ SOSY
PREFILLED_SYRINGE | SUBCUTANEOUS | Status: AC
Start: 2018-02-16 — End: ?
  Filled 2018-02-16: qty 0.6

## 2018-02-18 ENCOUNTER — Ambulatory Visit: Payer: BLUE CROSS/BLUE SHIELD

## 2018-02-26 ENCOUNTER — Ambulatory Visit: Payer: BLUE CROSS/BLUE SHIELD | Attending: Hematology | Admitting: Physical Therapy

## 2018-02-26 ENCOUNTER — Other Ambulatory Visit: Payer: Self-pay

## 2018-02-26 DIAGNOSIS — R252 Cramp and spasm: Secondary | ICD-10-CM | POA: Diagnosis present

## 2018-02-26 DIAGNOSIS — M6281 Muscle weakness (generalized): Secondary | ICD-10-CM | POA: Diagnosis present

## 2018-02-26 DIAGNOSIS — R262 Difficulty in walking, not elsewhere classified: Secondary | ICD-10-CM

## 2018-02-26 NOTE — Patient Instructions (Signed)
Access Code: [email or web address]  URL: [email or web address]  Date: 02/26/2018  Prepared by: Lovett Calender   Exercises  Tandem Stance - 10 reps - 3 sets - 1x daily - 7x weekly  Standing Hip Extension with Chair - 10 reps - 3 sets - 1x daily - 7x weekly  Standing March with Counter Support - 10 reps - 3 sets - 1x daily - 7x weekly  Standing Hip Abduction AROM - 10 reps - 3 sets - 1x daily - 7x weekly   [facility] [address] [place], [place] [address] Phone # [phone] Fax [phone]

## 2018-02-26 NOTE — Therapy (Addendum)
[facility] [address] [place], [place], [address] Phone: [phone]   Fax:  [phone]  Physical Therapy Evaluation  Patient Details  Name: David Clarke MRN: [id number] Date of Birth: 1955-09-02 Referring Provider: Truitt Merle, MD   Encounter Date: 02/26/2018    PT End of Session - 02/26/18 1734    Visit Number  1    Date for PT Re-Evaluation  05/21/18    PT Start Time  1147    PT Stop Time  1225    PT Time Calculation (min)  38 min    Activity Tolerance  Patient tolerated treatment well        Past Medical History:  Diagnosis Date  . Allergy   . Cancer ([place]) 10/09/2016   rectal adenocarcinoma  . Inguinal hernia     Past Surgical History:  Procedure Laterality Date  . COLONOSCOPY W/ BIOPSIES Bilateral 10/09/2016   rectal adenocarcinoma  . IR FLUORO GUIDE PORT INSERTION RIGHT  11/13/2017  . IR US GUIDE VASC ACCESS RIGHT  11/13/2017  . MOUTH SURGERY     age 62, to remove extra "eye" teeth  . [place] teeth extraction    . XI ROBOT ABDOMINAL PERINEAL RESECTION N/A 02/21/2017   Procedure: XI ROBOT ABDOMINOPERINEAL RESECTION WITH COLOSTOMY;  Surgeon: Michael Boston, MD;  Location: [facility];  Service: General;  Laterality: N/A;    There were no vitals filed for this visit.      Subjective Assessment - 02/26/18 1150    Subjective  Pt reports he had surgery end of June last year and has had pelvic floor PT with no urinary or sexual function issues at this time. Pt has ileostomy in place and no issues regarding BM or ileostomy management. Pt presents to PT with Right thigh numbness and complains of deconditioning. He recenlty began to feel his energy coming back and he is walking with a cane occasion. Radiation ended in May 2019 to Right hip lymph node. He has numbness in his right thigh.     How long can you walk comfortably?  10-15 minutes limited by fatigued    Patient Stated Goals  build up strength in right  leg      Patient denies pain - No pain   [facility] PT Assessment - 03/03/18 0001      Assessment   Medical Diagnosis  C20 (ICD-10-CM) - Rectal adenocarcinoma ([place])    Referring Provider  Truitt Merle, MD    Onset Date/Surgical Date  -- deconditioning from cancer treatments - chemo and radiation    Prior Therapy  Yes      Precautions   Precautions  None      Restrictions   Weight Bearing Restrictions  No      Balance Screen   Has the patient fallen in the past 6 months  Yes    How many times?  1 2 months ago, slipped    Has the patient had a decrease in activity level because of a fear of falling?   No    Is the patient reluctant to leave their home because of a fear of falling?   No      Home Environment   Living Environment  Private residence    Living Arrangements  Spouse/significant other      Prior Function   Level of Independence  Independent      Cognition   Overall Cognitive Status  Within Functional Limits for tasks assessed  Observation/Other Assessments   Focus on Therapeutic Outcomes (FOTO)   6MWT and LE balance - 80% limited based off clinical immpression      Posture/Postural Control   Posture/Postural Control  Postural limitations    Postural Limitations  Rounded Shoulders;Weight shift left      PROM   Overall PROM Comments  bilateral hip overally 25% limited    Lumbar Flexion  80%    Lumbar Extension  80%      Strength   Right Hip Flexion  4-/5    Right Hip Extension  4-/5    Right Hip External Rotation   4/5    Right Hip ABduction  4-/5    Right Hip ADduction  3+/5    Left Hip Flexion  4+/5    Left Hip Extension  4+/5    Left Hip External Rotation  4+/5    Left Hip ABduction  4/5    Left Hip ADduction  4/5      Flexibility   Soft Tissue Assessment /Muscle Length  yes    Hamstrings  80%      Ambulation/Gait   Gait Pattern  Within Functional Limits      6 minute walk test results    Aerobic Endurance Distance Walked  1240 significant slow  down after 3-4 minutes      Standardized Balance Assessment   Standardized Balance Assessment  Timed Up and Go Test;Five Times Sit to Stand    Five times sit to stand comments   23 needs min assist to stand with UE      Timed Up and Go Test   Normal TUG (seconds)  10      High Level Balance   High Level Balance Activites  Other (comment)    High Level Balance Comments  single leg stand 10+ sec Lt LE; 2 sec Rt LE             PT Education - 02/26/18 1733    Education Details  Access Code: [email or web address]     Person(s) Educated  Patient    Methods  Explanation;Demonstration;Handout;Verbal cues    Comprehension  Verbalized understanding;Returned demonstration           Objective measurements completed on examination: See above findings.      [facility] Adult PT Treatment/Exercise - 03/03/18 0001      Self-Care   Self-Care  Other Self-Care Comments    Other Self-Care Comments   initial HEP as seen in chart               PT Short Term Goals - 03/03/18 1103      PT SHORT TERM GOAL #1   Title  independent with flexibility exercises to improve elongation of the hip and thigh muscles    Time  4    Period  Weeks    Status  New    Target Date  03/26/18      PT SHORT TERM GOAL #2   Title  pt will be able to perform single leg stand on Rt LE for at least 4 seconds without UE support    Time  4    Period  Weeks    Status  New    Target Date  03/26/18      PT SHORT TERM GOAL #3   Title  pt will perform 6 min walk test and ambulate 1350 feet or more due to improved endurance    Time  4    Period  Weeks    Status  New    Target Date  03/26/18        PT Long Term Goals - 03/03/18 1106      PT LONG TERM GOAL #1   Title  independent with HEP and understand how to progress himself    Time  12    Period  Weeks    Status  New    Target Date  05/21/18      PT LONG TERM GOAL #2   Title  6 min walk test 1600 feet or more due to improved strength and endurance    Time   12    Period  Weeks    Status  New    Target Date  05/21/18      PT LONG TERM GOAL #3   Title  ability to be up walking and standing for 45 minutes or more due to improved endurance    Time  12    Period  Weeks    Status  New    Target Date  05/21/18      PT LONG TERM GOAL #4   Title  ability to lift 25# with correct bodymechanics due to LE strength >/= 4+/5    Time  12    Period  Weeks    Status  New    Target Date  05/21/18      PT LONG TERM GOAL #5   Title  pt will report 25% reduction in nerve related symptoms such as numbness in Rt thigh    Time  12    Period  Weeks    Status  New    Target Date  05/21/18             Plan - 03/03/18 1053    Clinical Impression Statement  Patient presents to clinic due to deconditioning from cancer treatments.  He has been experiencing worsening of numbness and weakness in Rt LE.  Pt demonstrates decreased endurance in 6 min walk test with significant slowing of pace after walking 3-4 minutes.  He completed test without stopping 1240 feet.  Pt able to do TUG in 10 seconds. Pt has ability to balance on single leg Lt LE for over 10 seconds and Rt LE only 2 seconds max.  Pt reports no bowel or bladder or sexual function difficulties.  He does state that his endurance and fatigue prevent him from wanting to participate in sexual intercourse.  Pt has decreased strength and ROM as noted above.  Pt will benefit from skilled PT to address impairments so he can return to maximum function.    History and Personal Factors relevant to plan of care:  rectal adenocarcinoma treatments including chemo and radiation, ileostomy in place and well managed, currently taking chemo    Clinical Presentation  Evolving    Clinical Presentation due to:  worsening of Rt LE numbness    Clinical Decision Making  Moderate    Rehab Potential  Excellent    PT Frequency  2x / week    PT Duration  8 weeks    PT Treatment/Interventions  ADLs/Self Care Home  Management;Biofeedback;Pensions consultant;Therapeutic activities;Therapeutic exercise;Neuromuscular re-education;Balance training;Patient/family education;Manual techniques;Dry needling;Manual lymph drainage;Passive range of motion;Taping;Energy conservation    PT Next Visit Plan  endurance, LE ROM and strength, lumbar, glute, and hamstring stretches    PT Home Exercise Plan  Access Code: [email or web address]     Recommended Other Services  eval 02/26/18  Consulted and Agree with Plan of Care  Patient       Patient will benefit from skilled therapeutic intervention in order to improve the following deficits and impairments:  Decreased endurance, Decreased range of motion, Decreased strength, Difficulty walking  Visit Diagnosis: Difficulty in walking, not elsewhere classified  Muscle weakness (generalized)     Problem List Patient Active Problem List   Diagnosis Date Noted  . Port-A-Cath in place 02/27/2018  . Goals of care, counseling/discussion 10/30/2017  . 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 Fever   . Dehydration   . Diarrhea   . 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 Hypokalemia 12/03/2016  . Leukopenia 12/03/2016  . Prolonged Q-T interval on ECG 12/03/2016  . Oral thrush 12/03/2016  . Hemochromatosis 11/19/2016  . Family history of hemochromatosis 10/25/2016  . Iron deficiency anemia due to chronic blood loss 10/25/2016  . Rectal adenocarcinoma ([place]) 10/05/2016    Zannie Cove, PT 03/03/2018, 11:14 AM  [facility] [address], [place], [place], [address] Phone: [phone]   Fax:  [phone]  Name: David Clarke MRN: [id number] Date of Birth: 11-30-1955

## 2018-02-27 ENCOUNTER — Encounter: Payer: Self-pay | Admitting: Nurse Practitioner

## 2018-02-27 ENCOUNTER — Inpatient Hospital Stay: Payer: BLUE CROSS/BLUE SHIELD

## 2018-02-27 ENCOUNTER — Encounter: Payer: Self-pay | Admitting: Physical Therapy

## 2018-02-27 ENCOUNTER — Inpatient Hospital Stay: Payer: BLUE CROSS/BLUE SHIELD | Attending: Hematology

## 2018-02-27 ENCOUNTER — Telehealth: Payer: Self-pay | Admitting: Nurse Practitioner

## 2018-02-27 ENCOUNTER — Inpatient Hospital Stay (HOSPITAL_BASED_OUTPATIENT_CLINIC_OR_DEPARTMENT_OTHER): Payer: BLUE CROSS/BLUE SHIELD | Admitting: Nurse Practitioner

## 2018-02-27 VITALS — BP 121/84 | HR 97 | Temp 97.6°F | Resp 18 | Ht 72.0 in | Wt 125.0 lb

## 2018-02-27 DIAGNOSIS — C20 Malignant neoplasm of rectum: Secondary | ICD-10-CM | POA: Insufficient documentation

## 2018-02-27 DIAGNOSIS — C787 Secondary malignant neoplasm of liver and intrahepatic bile duct: Secondary | ICD-10-CM

## 2018-02-27 DIAGNOSIS — G620 Drug-induced polyneuropathy: Secondary | ICD-10-CM | POA: Insufficient documentation

## 2018-02-27 DIAGNOSIS — D500 Iron deficiency anemia secondary to blood loss (chronic): Secondary | ICD-10-CM | POA: Diagnosis not present

## 2018-02-27 DIAGNOSIS — F5089 Other specified eating disorder: Secondary | ICD-10-CM | POA: Diagnosis not present

## 2018-02-27 DIAGNOSIS — L270 Generalized skin eruption due to drugs and medicaments taken internally: Secondary | ICD-10-CM | POA: Diagnosis not present

## 2018-02-27 DIAGNOSIS — Z5189 Encounter for other specified aftercare: Secondary | ICD-10-CM | POA: Insufficient documentation

## 2018-02-27 DIAGNOSIS — Z5112 Encounter for antineoplastic immunotherapy: Secondary | ICD-10-CM | POA: Insufficient documentation

## 2018-02-27 DIAGNOSIS — K922 Gastrointestinal hemorrhage, unspecified: Secondary | ICD-10-CM | POA: Diagnosis not present

## 2018-02-27 DIAGNOSIS — C7989 Secondary malignant neoplasm of other specified sites: Secondary | ICD-10-CM | POA: Diagnosis not present

## 2018-02-27 DIAGNOSIS — Z5111 Encounter for antineoplastic chemotherapy: Secondary | ICD-10-CM | POA: Diagnosis not present

## 2018-02-27 DIAGNOSIS — Z148 Genetic carrier of other disease: Secondary | ICD-10-CM | POA: Diagnosis not present

## 2018-02-27 DIAGNOSIS — R530 Neoplastic (malignant) related fatigue: Secondary | ICD-10-CM | POA: Diagnosis not present

## 2018-02-27 DIAGNOSIS — C7800 Secondary malignant neoplasm of unspecified lung: Secondary | ICD-10-CM | POA: Insufficient documentation

## 2018-02-27 DIAGNOSIS — Z923 Personal history of irradiation: Secondary | ICD-10-CM | POA: Insufficient documentation

## 2018-02-27 DIAGNOSIS — T451X5A Adverse effect of antineoplastic and immunosuppressive drugs, initial encounter: Secondary | ICD-10-CM

## 2018-02-27 DIAGNOSIS — E876 Hypokalemia: Secondary | ICD-10-CM | POA: Diagnosis not present

## 2018-02-27 DIAGNOSIS — Z9221 Personal history of antineoplastic chemotherapy: Secondary | ICD-10-CM | POA: Diagnosis not present

## 2018-02-27 DIAGNOSIS — C778 Secondary and unspecified malignant neoplasm of lymph nodes of multiple regions: Secondary | ICD-10-CM

## 2018-02-27 DIAGNOSIS — Z95828 Presence of other vascular implants and grafts: Secondary | ICD-10-CM | POA: Insufficient documentation

## 2018-02-27 LAB — CBC WITH DIFFERENTIAL/PLATELET
BASOS ABS: 0.1 10*3/uL (ref 0.0–0.1)
BASOS PCT: 1 %
EOS ABS: 0 10*3/uL (ref 0.0–0.5)
Eosinophils Relative: 0 %
HCT: 33.4 % — ABNORMAL LOW (ref 38.4–49.9)
HEMOGLOBIN: 11.2 g/dL — AB (ref 13.0–17.1)
LYMPHS PCT: 3 %
Lymphs Abs: 0.4 10*3/uL — ABNORMAL LOW (ref 0.9–3.3)
MCH: 33.6 pg — ABNORMAL HIGH (ref 27.2–33.4)
MCHC: 33.6 g/dL (ref 32.0–36.0)
MCV: 99.9 fL — ABNORMAL HIGH (ref 79.3–98.0)
Monocytes Absolute: 1 10*3/uL — ABNORMAL HIGH (ref 0.1–0.9)
Monocytes Relative: 8 %
NEUTROS ABS: 11.4 10*3/uL — AB (ref 1.5–6.5)
NEUTROS PCT: 88 %
Platelets: 269 10*3/uL (ref 140–400)
RBC: 3.34 MIL/uL — AB (ref 4.20–5.82)
RDW: 17.6 % — ABNORMAL HIGH (ref 11.0–14.6)
WBC: 13 10*3/uL — AB (ref 4.0–10.3)

## 2018-02-27 LAB — COMPREHENSIVE METABOLIC PANEL
ALBUMIN: 3.1 g/dL — AB (ref 3.5–5.0)
ALK PHOS: 256 U/L — AB (ref 38–126)
ALT: 12 U/L (ref 0–44)
AST: 23 U/L (ref 15–41)
Anion gap: 9 (ref 5–15)
BUN: 9 mg/dL (ref 8–23)
CALCIUM: 9.3 mg/dL (ref 8.9–10.3)
CO2: 24 mmol/L (ref 22–32)
Chloride: 103 mmol/L (ref 98–111)
Creatinine, Ser: 0.72 mg/dL (ref 0.61–1.24)
GFR calc Af Amer: >60 mL/min (ref >60)
GFR calc non Af Amer: >60 mL/min (ref >60)
GLUCOSE: 99 mg/dL (ref 70–99)
Potassium: 3.4 mmol/L — ABNORMAL LOW (ref 3.5–5.1)
SODIUM: 136 mmol/L (ref 135–145)
Total Bilirubin: 0.3 mg/dL (ref 0.3–1.2)
Total Protein: 6.7 g/dL (ref 6.5–8.1)

## 2018-02-27 LAB — MAGNESIUM: Magnesium: 1.5 mg/dL — ABNORMAL LOW (ref 1.7–2.4)

## 2018-02-27 MED ORDER — MAGNESIUM OXIDE 400 (241.3 MG) MG PO TABS: 400.0000 mg | ORAL_TABLET | Freq: Every day | ORAL | 1 refills | Status: DC

## 2018-02-27 MED ORDER — SODIUM CHLORIDE 0.9 % IV SOLN
Freq: Once | INTRAVENOUS | Status: AC
  Administered 2018-02-27: 12:00:00 via INTRAVENOUS

## 2018-02-27 MED ORDER — DEXTROSE 5 % IV SOLN
400.0000 mg/m2 | Freq: Once | INTRAVENOUS | Status: AC
  Administered 2018-02-27: 680 mg via INTRAVENOUS
  Filled 2018-02-27: qty 34

## 2018-02-27 MED ORDER — PANITUMUMAB CHEMO INJECTION 100 MG/5ML
6.0000 mg/kg | Freq: Once | INTRAVENOUS | Status: AC
  Administered 2018-02-27: 340 mg via INTRAVENOUS
  Filled 2018-02-27: qty 17

## 2018-02-27 MED ORDER — DEXTROSE 5 % IV SOLN
130.0000 mg/m2 | Freq: Once | INTRAVENOUS | Status: AC
  Administered 2018-02-27: 220 mg via INTRAVENOUS
  Filled 2018-02-27: qty 11

## 2018-02-27 MED ORDER — ATROPINE SULFATE 1 MG/ML IJ SOLN: 0.5000 mg | Freq: Once | INTRAMUSCULAR | Status: DC | PRN

## 2018-02-27 MED ORDER — DEXTROSE 5 % IV SOLN
INTRAVENOUS | Status: DC
  Administered 2018-02-27: 14:00:00 via INTRAVENOUS

## 2018-02-27 MED ORDER — PALONOSETRON HCL INJECTION 0.25 MG/5ML
INTRAVENOUS | Status: AC
  Filled 2018-02-27: qty 5

## 2018-02-27 MED ORDER — OXALIPLATIN CHEMO INJECTION 100 MG/20ML
60.0000 mg/m2 | Freq: Once | INTRAVENOUS | Status: AC
  Administered 2018-02-27: 100 mg via INTRAVENOUS
  Filled 2018-02-27: qty 20

## 2018-02-27 MED ORDER — ATROPINE SULFATE 1 MG/ML IJ SOLN
INTRAMUSCULAR | Status: AC
  Filled 2018-02-27: qty 1

## 2018-02-27 MED ORDER — SODIUM CHLORIDE 0.9% FLUSH
10.0000 mL | Freq: Once | INTRAVENOUS | Status: AC
  Administered 2018-02-27: 10 mL
  Filled 2018-02-27: qty 10

## 2018-02-27 MED ORDER — SODIUM CHLORIDE 0.9 % IV SOLN
2200.0000 mg/m2 | INTRAVENOUS | Status: DC
  Administered 2018-02-27: 3750 mg via INTRAVENOUS
  Filled 2018-02-27: qty 75

## 2018-02-27 MED ORDER — PALONOSETRON HCL INJECTION 0.25 MG/5ML
0.2500 mg | Freq: Once | INTRAVENOUS | Status: AC
  Administered 2018-02-27: 0.25 mg via INTRAVENOUS

## 2018-02-27 MED ORDER — DEXAMETHASONE 4 MG PO TABS: 4.0000 mg | ORAL_TABLET | Freq: Every day | ORAL | 2 refills | Status: DC

## 2018-02-27 MED ORDER — FOSAPREPITANT DIMEGLUMINE INJECTION 150 MG
Freq: Once | INTRAVENOUS | Status: AC
  Administered 2018-02-27: 12:00:00 via INTRAVENOUS
  Filled 2018-02-27: qty 5

## 2018-02-27 NOTE — Patient Instructions (Signed)
[facility] Discharge Instructions for Patients Receiving Chemotherapy  Today you received the following chemotherapy agents Vectibix, oxaliplatin, leucovorin, irinotecan and 5FU  To help prevent nausea and vomiting after your treatment, we encourage you to take your nausea medication as directed   If you develop nausea and vomiting that is not controlled by your nausea medication, call the clinic.   BELOW ARE SYMPTOMS THAT SHOULD BE REPORTED IMMEDIATELY:  *FEVER GREATER THAN 100.5 F  *CHILLS WITH OR WITHOUT FEVER  NAUSEA AND VOMITING THAT IS NOT CONTROLLED WITH YOUR NAUSEA MEDICATION  *UNUSUAL SHORTNESS OF BREATH  *UNUSUAL BRUISING OR BLEEDING  TENDERNESS IN MOUTH AND THROAT WITH OR WITHOUT PRESENCE OF ULCERS  *URINARY PROBLEMS  *BOWEL PROBLEMS  UNUSUAL RASH Items with * indicate a potential emergency and should be followed up as soon as possible.  Feel free to call the clinic should you have any questions or concerns. The clinic phone number is [phone].  Please show the [place] [facility] at check-in to the Emergency Department and triage nurse.

## 2018-02-27 NOTE — Telephone Encounter (Signed)
Appointments scheduled AVS/Calendar printed per 7/3 los/ One infusion appt to be added after patient moved PT visit 7/31 @ 11:45

## 2018-02-27 NOTE — Progress Notes (Signed)
[facility]  Clinic Follow up Note   Patient Care Team: Robyn Haber, MD as PCP - General (Family Medicine) Michael Boston, MD as Consulting Physician (General Surgery) Pyrtle, Lajuan Lines, MD as Consulting Physician (Gastroenterology) Truitt Merle, MD as Consulting Physician (Hematology and Oncology) Kyung Rudd, MD as Consulting Physician (Radiation Oncology) 02/27/2018  SUMMARY OF ONCOLOGIC HISTORY: Oncology History   Cancer Staging Rectal adenocarcinoma [facility]) Staging form: Colon and Rectum, AJCC 8th Edition - Clinical stage from 10/09/2016: Stage IIIB (cT3, cN1b, cM0) - Signed by Truitt Merle, MD on 10/25/2016 - Pathologic stage from 02/21/2017: Stage IIA (ypT3, pN0, cM0) - Signed by Truitt Merle, MD on 10/18/2017       Rectal adenocarcinoma ([place])   10/03/2016 - 10/03/2016 Hospital Admission    Admit date: 10/03/16 The patient presented to the [facility] ED with rectal bleeding and difficulty with hygiene.      10/09/2016 Initial Diagnosis    Rectal adenocarcinoma       10/09/2016 Procedure    Colonoscopy showed a fungating, infiltrative and ulcerated partially obstructing large mass in the distal rectum, measuring 9 cm, prolapsing at anal canal. Scope not advanced proximal to the rectosigmoid colon.      10/09/2016 Initial Biopsy    Rectal mass biopsy showed invasive adenocarcinoma.      10/13/2016 Imaging    CT Chest Abdomen Pelvis w contrast IMPRESSION: Large irregular enhancing rectal mass consistent with known rectal cancer. There are small perirectal and small pelvic sidewall lymph nodes which are suspicious. Prominent perirectal vessels are also noted. No evidence of metastatic retroperitoneal or sigmoid mesocolon adenopathy or hepatic or pulmonary metastasis.      10/24/2016 Imaging    MRI Pelvis IMPRESSION: 1. Moderately motion degraded exam. 2. Large mass is centered at the anus with extension into the low rectum.  No complicating obstruction. 3. Mild mesorectal adenopathy, suspicious. 4. Right inguinal hernia containing nonobstructive small bowel.       11/08/2016 - 12/19/2016 Chemotherapy    Xeloda 50 mg twice daily, with concurrent radiation, held since 12/03/2016 due to colitis and hospitalization      11/08/2016 - 12/19/2016 Radiation Therapy    Neoadjuvant radiation to his rectal cancer  1) Pelvis/ 45 Gy in 25 fractions  2) Rectum boost/ 9 Gy in 5 fractions Under the care of Dr. Lisbeth Renshaw.      12/03/2016 - 12/09/2016 Hospital Admission    He presented with few days to one week history of diarrhea with 6-7 loose stools daily, along with a fever of 101.5 on the day of admission. Patient was also noted to be hypokalemic, hyponatremic. Hospitalized for further management. Stool studies were sent. C. difficile was negative. GI pathogen panel was positive for Yersinia.  XELODA was held at this time, until infection has cleared.       12/07/2016 Imaging    CT A/P W Contrast  IMPRESSION: 1. Abnormal dilatation of the proximal small bowel loops worrisome for either small bowel obstruction versus small bowel ileus. 2. 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 3. Large right inguinal hernia containing edematous loop of distal small bowel 4. No pneumatosis or bowel perforation.  No abscess. 5. Aortic atherosclerosis 6. Mild decrease in size of rectal mass. No change in right common iliac adenopathy.      02/21/2017 Surgery    XI ROBOT ABDOMINOPERINEAL RESECTION WITH COLOSTOMY by  Dr. Johney Maine      02/21/2017 Pathology Results    Diagnosis 02/21/17 1. Colon, segmental resection for tumor, rectosigmoid - INVASIVE ADENOCARCINOMA, MODERATELY DIFFERENTIATED, SPANNING 3.9 CM. - TUMOR INVADES THROUGH MUSCULARIS PROPRIA. - RESECTION MARGINS ARE NEGATIVE. - EIGHT OF EIGHT LYMPH NODES NEGATIVE FOR CARCINOMA (0/8),  SEE COMMENT. - SEE ONCOLOGY TABLE. 2. Soft tissue, biopsy, left lateral pelvic floor - BENIGN SKELETAL MUSCLE AND SOFT TISSUE. - NO MALIGNANCY IDENTIFIED. 3. Colon, segmental resection, proximal sigmoid - BENIGN COLONIC MUCOSA. - NO DYSPLASIA OR MALIGNANCY. 4. Liver, biopsy, liver mass - BENIGN VASCULAR PROLIFERATION CONSISTENT WITH HEMANGIOMA. - NO MALIGNANCY IDENTIFIED.      04/04/2017 - 08/12/2017 Chemotherapy    CAPOX every 3 weeks for 6 cycles. I decreased his first cycle of oxaliplatin platinum by 15%, he tolerated well, so we increased to full dose from cycle 2. He completed 5 cycles of oxaliplatin on 07/16/17 and completed Xeloda on 08/12/17.        08/15/2017 Procedure    Colonoscopy 08/15/17 IMPRESSION - Patent end colostomy with healthy appearing mucosa in the sigmoid colon. - The entire examined colon is normal. - No specimens collected.      09/18/2017 Imaging    CT CAP W Contrast 09/18/17 IMPRESSION: Interval abdomino-peritoneal resection with left lower quadrant colostomy. Increased diffuse cystitis, likely due to radiation. New presacral soft tissue density with central low attenuation, also suspicious for post treatment changes. Recommend continued attention on follow-up imaging. New mild retroperitoneal lymphadenopathy in the aortocaval space, measuring up to 1.8 cm. This is suspicious for metastatic disease, although it is conceivable that this could be reactive in etiology due to interval surgery and radiation therapy. Recommend short-term follow-up by CT in 3 months. New indeterminate 4 mm left upper lobe pulmonary nodule, likely inflammatory in etiology with metastatic disease considered less likely. Recommend continued attention on follow-up CT. Bilateral inguinal hernias, largest on the right containing several small bowel loops.      10/17/2017 PET scan    MPRESSION: Increased hypermetabolic lymphadenopathy in abdominal retroperitoneum, right  common iliac chain, and left supraclavicular region, consistent with metastatic disease. Stable tiny bilateral pulmonary nodules show no FDG uptake but are too small to characterize by PET. Recommend continued attention on follow-up CT. 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      10/29/2017 Relapse/Recurrence    Diagnosis Lymph node, needle/core biopsy, left supraclavicular - METASTATIC ADENOCARCINOMA, CONSISTENT WITH COLORECTAL PRIMARY. - SEE COMMENT.      11/20/2017 - 01/17/2018 Chemotherapy    FOLFIRI every 2 weeks, panitumumab was added from cycle 3.  FOLFIRI was held after cycle 3 due to disease progression, he continued with 5-FU and Panitumumab until further disease progression.        01/08/2018 Imaging    CT CAP with contrast IMPRESSION: 1. Today's study demonstrates dramatic progression of disease with interval development of widespread pulmonary metastases, multiple new liver lesions and progressive lymphadenopathy noted in the pelvis, retroperitoneum and left supraclavicular nodal stations. In addition, the previously noted metastatic lesion in the right psoas muscle has markedly increased in size. Whether or not this reflects growth of the lesion and/or internal hemorrhage of the lesion is uncertain. 2. Stable postoperative thickening and small postoperative fluid collection in the presacral space, as above, following abdominal perineal resection. 3. Large right inguinal hernia containing several loops of small bowel, without evidence of bowel incarceration or obstruction at this time.      01/14/2018 - 01/24/2018 Radiation  Therapy    Radiation with Dr. Lisbeth Renshaw starting 01/14/18. Plan to complete on 01/24/18      01/31/2018 -  Chemotherapy    FOLFIRINOX plus Vectibix every 2 weeks starting 01/31/18     CURRENT THERAPY:  -FOFIRINOX with dose reduction plus panitumumab every 2 weeks starting on 01/31/18   INTERVAL HISTORY: David Clarke  returns for follow up and cycle 7 FOLFIRINOX. He completed cycle 6 with dose-reduced oxaliplatin and irinotecan on 6/20 with udenyca on day 3. He denies bone or other pain, took claritin. He experienced prolonged fatigue and was less active in general. He began PT to build leg strength. His appetite remains low, weight is stable. He began remeron and has taken 2 doses. Has not noticed effect on appetite yet. He has stable transient neuropathy to feet, uses gabapentin intermittently. Numbness to right thigh is improved. Using his cane less. Cold sensitivity after oxaliplatin affects his fingertips, lasts 3-4 days; he is still able to drink cold liquids. Has oral sensitivity especially with acidic food/drink, but denies obvious mouth sores. Uses salt/soda rinse at home. Hands and feet peel on day 1 after treatment, uses moisturizer. He has cuticle sensitivity, no drainage from nails. He has occasional loose stool, not watery. GERD nearly resolved with prilosec. He has satiety after a few bites of food, often resumes eating after a short break.   REVIEW OF SYSTEMS:   Constitutional: Denies fevers, chills or abnormal weight loss (+) fatigue (+) low appetite Eyes: Denies blurriness of vision Ears, nose, mouth, throat, and face: Denies mucositis or sore throat (+) oral sensitivity with acidic foods/drink Respiratory: Denies cough, dyspnea or wheezes Cardiovascular: Denies palpitation, chest discomfort or lower extremity swelling Gastrointestinal:  Denies nausea, vomiting, constipation, diarrhea, or change in bowel habits (+) GERD, nearly resolved with Prilosec (+) early satiety (+) occasional loose stool Skin: (+) Acne-type skin rash to face, neck, chest (+) hands/feet peeling after chemo (+) cuticle sensitivity Lymphatics: Denies new lymphadenopathy or easy bruising Neurological:Denies new weaknesses (+) transient peripheral neuropathy to feet, stable (+) sensitivity to hands last 3-4 days Behavioral/Psych:  Mood is stable, no new changes  MSK: Denies bone pain with Neulasta All other systems were reviewed with the patient and are negative.  MEDICAL HISTORY:  Past Medical History:  Diagnosis Date  . Allergy   . Cancer ([place]) 10/09/2016   rectal adenocarcinoma  . Inguinal hernia     SURGICAL HISTORY: Past Surgical History:  Procedure Laterality Date  . COLONOSCOPY W/ BIOPSIES Bilateral 10/09/2016   rectal adenocarcinoma  . IR FLUORO GUIDE PORT INSERTION RIGHT  11/13/2017  . IR US GUIDE VASC ACCESS RIGHT  11/13/2017  . MOUTH SURGERY     age 57, to remove extra "eye" teeth  . [place] teeth extraction    . XI ROBOT ABDOMINAL PERINEAL RESECTION N/A 02/21/2017   Procedure: XI ROBOT ABDOMINOPERINEAL RESECTION WITH COLOSTOMY;  Surgeon: Michael Boston, MD;  Location: [facility];  Service: General;  Laterality: N/A;    I have reviewed the social history and family history with the patient and they are unchanged from previous note.  ALLERGIES:  is allergic to sulfa antibiotics.  MEDICATIONS:  Current Outpatient Medications  Medication Sig Dispense Refill  . clindamycin (CLINDAGEL) 1 % gel Apply topically 2 (two) times daily. 60 g 1  . mirtazapine (REMERON) 7.5 MG tablet Take 1 tablet (7.5 mg total) by mouth at bedtime. 30 tablet 0  . Multiple Vitamin (MULTIVITAMIN WITH MINERALS) TABS tablet Take 1 tablet by mouth  every other day.     Marland Kitchen omeprazole (PRILOSEC) 40 MG capsule Take 1 capsule (40 mg total) by mouth daily. 30 capsule 2  . potassium chloride SA (K-DUR,KLOR-CON) 20 MEQ tablet Take 1 tablet (20 mEq total) by mouth 2 (two) times daily. 60 tablet 1  . acetaminophen (TYLENOL) 500 MG tablet Take 500 mg by mouth every 6 (six) hours as needed.    . Cyanocobalamin (VITAMIN B 12 PO) Take 1 tablet by mouth daily.    Marland Kitchen dexamethasone (DECADRON) 4 MG tablet Take 1 tablet (4 mg total) by mouth daily. Start the day after chemo for 3-5 days. 10 tablet 2  . doxycycline (VIBRA-TABS) 100 MG tablet Take 1 tablet  (100 mg total) by mouth 2 (two) times daily. 60 tablet 2  . gabapentin (NEURONTIN) 100 MG capsule Take 1 capsule (100 mg total) by mouth at bedtime. 60 capsule 0  . lidocaine-prilocaine (EMLA) cream Apply 1 application topically as needed. 30 g 2  . lidocaine-prilocaine (EMLA) cream Apply to affected area once 30 g 3  . loperamide (IMODIUM A-D) 2 MG tablet Take 1 tablet (2 mg total) by mouth 4 (four) times daily as needed. Take 2 at diarrhea onset , then 1 every 2hr until 12hrs with no BM. May take 2 every 4hrs at night. If diarrhea recurs repeat. 100 tablet 1  . LORazepam (ATIVAN) 1 MG tablet Take 1 tablet (1 mg total) by mouth every 6 (six) hours as needed (NAUSEA). 30 tablet 0  . magnesium oxide (MAG-OX) 400 (241.3 Mg) MG tablet Take 1 tablet (400 mg total) by mouth daily. 30 tablet 1  . nystatin (MYCOSTATIN) 100000 UNIT/ML suspension Take 5 mLs (500,000 Units total) by mouth 4 (four) times daily. 473 mL 0  . ondansetron (ZOFRAN) 8 MG tablet Take 1 tablet (8 mg total) by mouth 2 (two) times daily as needed for refractory nausea / vomiting. Start on day 3 after chemotherapy. 30 tablet 1  . oxyCODONE-acetaminophen (PERCOCET/ROXICET) 5-325 MG tablet Take 1-2 tablets by mouth every 6 (six) hours as needed for severe pain. (Patient not taking: Reported on 02/14/2018) 40 tablet 0  . prochlorperazine (COMPAZINE) 10 MG tablet Take 1 tablet (10 mg total) by mouth every 6 (six) hours as needed (NAUSEA). 30 tablet 1  . ranitidine (ZANTAC) 150 MG tablet Take 150 mg by mouth 2 (two) times daily.    . traMADol (ULTRAM) 50 MG tablet Take 1-2 tablets (50-100 mg total) by mouth every 8 (eight) hours as needed. (Patient not taking: Reported on 02/14/2018) 60 tablet 0   Current Facility-Administered Medications  Medication Dose Route Frequency Provider Last Rate Last Dose  . 0.9 %  sodium chloride infusion  500 mL Intravenous Continuous Pyrtle, Lajuan Lines, MD      . 0.9 %  sodium chloride infusion  500 mL Intravenous Once  Pyrtle, Lajuan Lines, MD       Facility-Administered Medications Ordered in Other Visits  Medication Dose Route Frequency Provider Last Rate Last Dose  . 0.9 %  sodium chloride infusion   Intravenous Once Truitt Merle, MD      . atropine injection 0.5 mg  0.5 mg Intravenous Once PRN Truitt Merle, MD      . dextrose 5 % solution   Intravenous Continuous Truitt Merle, MD 20 mL/hr at 02/27/18 1425    . fluorouracil (ADRUCIL) 3,750 mg in sodium chloride 0.9 % 75 mL chemo infusion  2,200 mg/m2 (Order-Specific) Intravenous 1 day or 1 dose Truitt Merle,  MD      . irinotecan (CAMPTOSAR) 220 mg in dextrose 5 % 500 mL chemo infusion  130 mg/m2 (Order-Specific) Intravenous Once Truitt Merle, MD      . leucovorin 680 mg in dextrose 5 % 250 mL infusion  400 mg/m2 (Order-Specific) Intravenous Once Truitt Merle, MD      . oxaliplatin (ELOXATIN) 100 mg in dextrose 5 % 500 mL chemo infusion  60 mg/m2 (Order-Specific) Intravenous Once Truitt Merle, MD 260 mL/hr at 02/27/18 1426 100 mg at 02/27/18 1426    PHYSICAL EXAMINATION: ECOG PERFORMANCE STATUS: 2 - Symptomatic, <50% confined to bed  Vitals:   02/27/18 1018  BP: 121/84  Pulse: 97  Resp: 18  Temp: 97.6 F (36.4 C)  SpO2: 100%   Filed Weights   02/27/18 1018  Weight: 125 lb (56.7 kg)    GENERAL:alert, no distress and comfortable SKIN: Moderate acne type skin rash to face, neck, and chest.  Hyperpigmentation to hands without erythema, peeling, or cracking EYES: normal, Conjunctiva are pink and non-injected, sclera clear OROPHARYNX:no thrush or ulcers LYMPH: Left supraclavicular lymph node approximately 1 cm (previously 2 x 1.5 cm), nontender LUNGS: clear to auscultation with normal breathing effort HEART: regular rate & rhythm and no murmurs and no lower extremity edema ABDOMEN:abdomen soft, non-tender and normal bowel sounds.  Palpable mass in the right lower quadrant Musculoskeletal:no cyanosis of digits and no clubbing  NEURO: alert & oriented x 3 with fluent speech,  no focal motor/sensory deficits PAC without erythema  LABORATORY DATA:  I have reviewed the data as listed CBC Latest Ref Rng & Units 02/27/2018 02/14/2018 01/31/2018  WBC 4.0 - 10.3 K/uL 13.0(H) 1.8(L) 4.4  Hemoglobin 13.0 - 17.1 g/dL 11.2(L) 10.9(L) 10.6(L)  Hematocrit 38.4 - 49.9 % 33.4(L) 31.8(L) 31.1(L)  Platelets 140 - 400 K/uL 269 317 301     CMP Latest Ref Rng & Units 02/27/2018 02/14/2018 01/31/2018  Glucose 70 - 99 mg/dL 99 134 104  BUN 8 - 23 mg/dL _0 Creatinine 0.61 - 1.24 mg/dL 0.72 0.71 0.68(L)  Sodium 135 - 145 mmol/L 136 134(L) 135(L)  Potassium 3.5 - 5.1 mmol/L 3.4(L) 2.9(LL) 3.5  Chloride 98 - 111 mmol/L 103 99 101  CO2 22 - 32 mmol/L _1 Calcium 8.9 - 10.3 mg/dL 9.3 8.9 8.7  Total Protein 6.5 - 8.1 g/dL 6.7 6.4 6.4  Total Bilirubin 0.3 - 1.2 mg/dL 0.3 0.5 0.5  Alkaline Phos 38 - 126 U/L 256(H) 144 121  AST 15 - 41 U/L _2 ALT 0 - 44 U/L _3 CE[facility] ([facility])  10/27/2016: 7.81 11/27/2016: 5.56 01/10/2017: 5.61 03/14/17: 5.11 04/11/17: 5.87 05/23/17: 8.79 06/19/17:  9.15 07/16/17:  9.88 08/10/17: 10.40 09/18/17: 8.31 10/30/17: 8.11 11/20/17: 7.96 12/25/2017: 13.95 01/17/18: 10.68 02/14/2018: 7.17   PATHOLOGY:  10/29/17 Diagnosis Lymph node, needle/core biopsy, left supraclavicular - METASTATIC ADENOCARCINOMA, CONSISTENT WITH COLORECTAL PRIMARY. - SEE COMMENT. Microscopic Comment The malignant cells are positive for CDX-2 and cytokeratin 20. They are negative for cytokeratin 7, p63 and cytokeratin 5/6. The findings are consistent with primary colorectal adenocarcinoma. Dr. Thressa Sheller has reviewed the case and concurs with this interpretation. Dr. Burr Medico was paged on 10/30/17. Additional studies can be performed upon clinician request. (JBK:gt, 10/31/17)   Diagnosis 02/21/17 1. Colon, segmental resection for tumor, rectosigmoid - INVASIVE ADENOCARCINOMA, MODERATELY DIFFERENTIATED, SPANNING 3.9 CM. - TUMOR INVADES THROUGH MUSCULARIS  PROPRIA. - RESECTION MARGINS  ARE NEGATIVE. - EIGHT OF EIGHT LYMPH NODES NEGATIVE FOR CARCINOMA (0/8), SEE COMMENT. - SEE ONCOLOGY TABLE. 2. Soft tissue, biopsy, left lateral pelvic floor - BENIGN SKELETAL MUSCLE AND SOFT TISSUE. - NO MALIGNANCY IDENTIFIED. 3. Colon, segmental resection, proximal sigmoid - BENIGN COLONIC MUCOSA. - NO DYSPLASIA OR MALIGNANCY. 4. Liver, biopsy, liver mass - BENIGN VASCULAR PROLIFERATION CONSISTENT WITH HEMANGIOMA. - NO MALIGNANCY IDENTIFIED. Microscopic Comment 1. COLON AND RECTUM (INCLUDING TRANS-ANAL RESECTION): Specimen: Rectosigmoid colon with anus. Procedure: Abdominoperineal resection. Tumor site: Distal 1/3 rectum to anus. Specimen integrity: Intact. Macroscopic intactness of mesorectum: Incomplete. Macroscopic tumor perforation: Not identified. Invasive tumor: Maximum size: 3.9 cm Histologic type(s): Invasive adenocarcinoma. Histologic grade and differentiation: G2: moderately differentiated/low grade Type of polyp in which invasive carcinoma arose: N/A. Microscopic extension of invasive tumor: Tumor invades through muscularis propria. Lymph-Vascular invasion: Not identified. 1 of 3 FINAL for TANNOR, PYON [phone]) Microscopic Comment(continued) Peri-neural invasion: Present. Tumor deposit(s) (discontinuous extramural extension): Not identified. Resection margins: Proximal margin: Negative. Distal margin: Negative. Circumferential (radial) (posterior ascending, posterior descending; lateral and posterior mid-rectum; and entire lower 1/3 rectum): Negative. Mesenteric margin (sigmoid and transverse): Negative. Distance closest margin (if all above margins negative): <0.1 cm radial margin. >1 cm distal margin. Treatment effect (neo-adjuvant therapy): Present Additional polyp(s): N/A. Non-neoplastic findings: None. Lymph nodes: number examined 8; number positive: 0 (treatment effect present). Pathologic Staging: ypT3, ypN0,  ypMX Ancillary studies: Can be performed upon request. Comment: The fat was cleared and additional lymph nodes were not identified.  Diagnosis 10/09/2016 Rectum, biopsy, mass - ADENOCARCINOMA.    RADIOGRAPHIC STUDIES: I have personally reviewed the radiological images as listed and agreed with the findings in the report. No results found.   ASSESSMENT & PLAN: David Clarke 62 y.o.  gentleman, previously healthy, presented with an anal mass and a mild intermittent rectal bleeding for 5-6 months. Fatigue, anorexia, fatigue and weight loss for a month. He is currently on radiation for his rectal cancer, chemotherapy has been held lately.  1. Rectal Adenocarcinoma, low rectum, [id number], ypT3N0, stage IIIB, MSI-stable, nodes metastasis 09/2017, KRAS/NRAS/BRAF wild type  2. 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 Peripheral neuropathy, secondary to chemotherapy, G1 4. Goals of care discussion  5. Right sacral and anterolateral upper leg pain with associated numbness to mid-thigh - likely secondary to right iliac adenopathy and psoas involvement  6. Constipation  7. Rash on face and neck, secondary to panitumumab  8. Weight loss 9. Hypokalemia 10. Hypomagnesemia, secondary to panitumumab 11. Skin/nail toxicity, secondary to chemotherapy likely 5FU  David Clarke appears stable.  He completed cycle 6 FOLFIRINOX/vectibix.  He tolerated treatment fairly well, with prolonged fatigue, mild skin/nail toxicity, and neuropathy. His symptoms appear to be well controlled. I recommend he take gabapentin consistently for neuropathy. I recommend warm soaks for his hands/nails and topical hydrocortisone for mild hand-foot peeling after treatment. For prolonged fatigue, I recommend 3-5 day course of decadron, will support with IVF on day 3 with pump d/c. For oral sensitivity without mucositis, I recommend he continue  oral salt/baking soda rinse. He will call if he develops sores or increased pain for Rx with lidocaine. For vectibix-induced skin rash, I encouraged him to add hydrocortisone if he has increased rash, he can use on face and body. Doxycycline caused GI upset. Will monitor closely.   Labs reviewed, [place] responded to [place]. I recommend he continue oral K supplement and increase in  his diet. For hypomagnesemia Mg 1.5, I prescribed mag-ox 1 tab daily replacement to his pharmacy. Labs overall adequate for treatment today. Proceed with cycle 7 FOLFIRINOX at current dose reductions, udenyca on day 3 with IVF. Continue q2 weeks. F/u in 2 weeks with next cycle to consider possible dose increases.   PLAN: -Labs reviewed, proceed with cycle 7 FOLFIRINOX/vectibix with current dose reductions, Udenyca on day 3 -Add on 1 L NS with pump d/c -Begin MAg-ox 1 tab daily -Resume routine use of gabapentin -Continue mouth rinse, call for Rx with lidocain if you develop sores  -Decadron 4 mg daily x3-5 days, begin day after chemo  -Topical hydrocortisone to hands/feet for peeling, begin warm water soaks for nail toxicity  -Return in 2 weeks for f/u and cycle 8  All questions were answered. The patient knows to call the clinic with any problems, questions or concerns. No barriers to learning was detected. I spent 20 minutes counseling the patient face to face. The total time spent in the appointment was 25 minutes and more than 50% was on counseling and review of test results     Alla Feeling, NP 02/27/18

## 2018-03-01 ENCOUNTER — Inpatient Hospital Stay: Payer: BLUE CROSS/BLUE SHIELD

## 2018-03-01 VITALS — BP 99/70 | HR 74 | Temp 97.8°F | Resp 16

## 2018-03-01 DIAGNOSIS — D500 Iron deficiency anemia secondary to blood loss (chronic): Secondary | ICD-10-CM

## 2018-03-01 DIAGNOSIS — Z95828 Presence of other vascular implants and grafts: Secondary | ICD-10-CM

## 2018-03-01 DIAGNOSIS — C20 Malignant neoplasm of rectum: Secondary | ICD-10-CM | POA: Diagnosis not present

## 2018-03-01 MED ORDER — ALTEPLASE 2 MG IJ SOLR
2.0000 mg | Freq: Once | INTRAMUSCULAR | Status: DC | PRN
  Filled 2018-03-01: qty 2

## 2018-03-01 MED ORDER — PEGFILGRASTIM-CBQV 6 MG/0.6ML ~~LOC~~ SOSY
6.0000 mg | PREFILLED_SYRINGE | Freq: Once | SUBCUTANEOUS | Status: AC
Start: 2018-03-01 — End: 2018-03-01
  Administered 2018-03-01: 6 mg via SUBCUTANEOUS

## 2018-03-01 MED ORDER — PEGFILGRASTIM-CBQV 6 MG/0.6ML ~~LOC~~ SOSY
PREFILLED_SYRINGE | SUBCUTANEOUS | Status: AC
  Filled 2018-03-01: qty 0.6

## 2018-03-01 MED ORDER — SODIUM CHLORIDE 0.9% FLUSH
10.0000 mL | INTRAVENOUS | Status: DC | PRN
  Administered 2018-03-01: 10 mL
  Filled 2018-03-01: qty 10

## 2018-03-01 MED ORDER — HEPARIN SOD (PORK) LOCK FLUSH 100 UNIT/ML IV SOLN
500.0000 [IU] | Freq: Once | INTRAVENOUS | Status: AC | PRN
  Administered 2018-03-01: 500 [IU]
  Filled 2018-03-01: qty 5

## 2018-03-01 MED ORDER — HEPARIN SOD (PORK) LOCK FLUSH 100 UNIT/ML IV SOLN
250.0000 [IU] | Freq: Once | INTRAVENOUS | Status: DC | PRN
  Filled 2018-03-01: qty 5

## 2018-03-01 MED ORDER — SODIUM CHLORIDE 0.9 % IV SOLN
1000.0000 mL | Freq: Once | INTRAVENOUS | Status: AC
  Administered 2018-03-01: 1000 mL via INTRAVENOUS

## 2018-03-01 NOTE — Patient Instructions (Signed)
Dehydration, Adult Dehydration is when there is not enough fluid or water in your body. This happens when you lose more fluids than you take in. Dehydration can range from mild to very bad. It should be treated right away to keep it from getting very bad. Symptoms of mild dehydration may include:  Thirst.  Dry lips.  Slightly dry mouth.  Dry, warm skin.  Dizziness. Symptoms of moderate dehydration may include:  Very dry mouth.  Muscle cramps.  Dark pee (urine). Pee may be the color of tea.  Your body making less pee.  Your eyes making fewer tears.  Heartbeat that is uneven or faster than normal (palpitations).  Headache.  Light-headedness, especially when you stand up from sitting.  Fainting (syncope). Symptoms of very bad dehydration may include:  Changes in skin, such as: ? Cold and clammy skin. ? Blotchy (mottled) or pale skin. ? Skin that does not quickly return to normal after being lightly pinched and let go (poor skin turgor).  Changes in body fluids, such as: ? Feeling very thirsty. ? Your eyes making fewer tears. ? Not sweating when body temperature is high, such as in hot weather. ? Your body making very little pee.  Changes in vital signs, such as: ? Weak pulse. ? Pulse that is more than 100 beats a minute when you are sitting still. ? Fast breathing. ? Low blood pressure.  Other changes, such as: ? Sunken eyes. ? Cold hands and feet. ? Confusion. ? Lack of energy (lethargy). ? Trouble waking up from sleep. ? Short-term weight loss. ? Unconsciousness. Follow these instructions at home:  If told by your doctor, drink an ORS: ? Make an ORS by using instructions on the package. ? Start by drinking small amounts, about  cup (120 mL) every 5-10 minutes. ? Slowly drink more until you have had the amount that your doctor said to have.  Drink enough clear fluid to keep your pee clear or pale yellow. If you were told to drink an ORS, finish the ORS  first, then start slowly drinking clear fluids. Drink fluids such as: ? Water. Do not drink only water by itself. Doing that can make the salt (sodium) level in your body get too low (hyponatremia). ? Ice chips. ? Fruit juice that you have added water to (diluted). ? Low-calorie sports drinks.  Avoid: ? Alcohol. ? Drinks that have a lot of sugar. These include high-calorie sports drinks, fruit juice that does not have water added, and soda. ? Caffeine. ? Foods that are greasy or have a lot of fat or sugar.  Take over-the-counter and prescription medicines only as told by your doctor.  Do not take salt tablets. Doing that can make the salt level in your body get too high (hypernatremia).  Eat foods that have minerals (electrolytes). Examples include bananas, oranges, potatoes, tomatoes, and spinach.  Keep all follow-up visits as told by your doctor. This is important. Contact a doctor if:  You have belly (abdominal) pain that: ? Gets worse. ? Stays in one area (localizes).  You have a rash.  You have a stiff neck.  You get angry or annoyed more easily than normal (irritability).  You are more sleepy than normal.  You have a harder time waking up than normal.  You feel: ? Weak. ? Dizzy. ? Very thirsty.  You have peed (urinated) only a small amount of very dark pee during 6-8 hours. Get help right away if:  You have symptoms of   very bad dehydration.  You cannot drink fluids without throwing up (vomiting).  Your symptoms get worse with treatment.  You have a fever.  You have a very bad headache.  You are throwing up or having watery poop (diarrhea) and it: ? Gets worse. ? Does not go away.  You have blood or something green (bile) in your throw-up.  You have blood in your poop (stool). This may cause poop to look black and tarry.  You have not peed in 6-8 hours.  You pass out (faint).  Your heart rate when you are sitting still is more than 100 beats a  minute.  You have trouble breathing. This information is not intended to replace advice given to you by your health care provider. Make sure you discuss any questions you have with your health care provider. Document Released: 06/10/2009 Document Revised: 03/03/2016 Document Reviewed: 10/08/2015 Elsevier Interactive Patient Education  2018 Elsevier Inc.  

## 2018-03-03 NOTE — Addendum Note (Signed)
Addended by: Lovett Calender D on: 03/03/2018 11:17 AM   Modules accepted: Orders

## 2018-03-04 ENCOUNTER — Encounter: Payer: Self-pay | Admitting: Radiation Oncology

## 2018-03-04 ENCOUNTER — Other Ambulatory Visit: Payer: Self-pay

## 2018-03-04 ENCOUNTER — Ambulatory Visit
Admission: RE | Admit: 2018-03-04 | Discharge: 2018-03-04 | Disposition: A | Discharge status: Home / Self Care | Payer: BLUE CROSS/BLUE SHIELD | Source: Ambulatory Visit | Attending: Radiation Oncology | Admitting: Radiation Oncology

## 2018-03-04 ENCOUNTER — Ambulatory Visit: Payer: BLUE CROSS/BLUE SHIELD | Admitting: Nurse Practitioner

## 2018-03-04 VITALS — BP 97/82 | HR 98 | Temp 98.1°F | Resp 20 | Ht <= 58 in | Wt 121.4 lb

## 2018-03-04 DIAGNOSIS — C20 Malignant neoplasm of rectum: Secondary | ICD-10-CM | POA: Diagnosis not present

## 2018-03-04 DIAGNOSIS — L309 Dermatitis, unspecified: Secondary | ICD-10-CM | POA: Diagnosis not present

## 2018-03-04 DIAGNOSIS — K117 Disturbances of salivary secretion: Secondary | ICD-10-CM | POA: Insufficient documentation

## 2018-03-04 DIAGNOSIS — Z923 Personal history of irradiation: Secondary | ICD-10-CM | POA: Insufficient documentation

## 2018-03-04 DIAGNOSIS — C779 Secondary and unspecified malignant neoplasm of lymph node, unspecified: Secondary | ICD-10-CM | POA: Insufficient documentation

## 2018-03-04 DIAGNOSIS — C800 Disseminated malignant neoplasm, unspecified: Secondary | ICD-10-CM

## 2018-03-04 DIAGNOSIS — Z79899 Other long term (current) drug therapy: Secondary | ICD-10-CM | POA: Insufficient documentation

## 2018-03-04 NOTE — Progress Notes (Signed)
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  The patient's right hip and leg pain have significantly reduced. He remains on systemic therapy which has caused dermatitis symptoms and dry mouth with early mucositis like mucosal pains. No other complaints are noted.                      ALLERGIES:  is allergic to sulfa antibiotics.  Meds: Current Outpatient Medications  Medication Sig Dispense Refill  . clindamycin (CLINDAGEL) 1 % gel Apply topically 2 (two) times daily. 60 g 1  . Cyanocobalamin (VITAMIN B 12 PO) Take 1 tablet by mouth daily.    Marland Kitchen dexamethasone (DECADRON) 4 MG tablet Take 1 tablet (4 mg total) by mouth daily. Start the day after chemo for 3-5 days. 10 tablet 2  . doxycycline (VIBRA-TABS) 100 MG tablet Take 1 tablet (100 mg total) by mouth 2 (two) times daily. 60 tablet 2  . gabapentin (NEURONTIN) 100 MG capsule Take 1 capsule (100 mg total) by mouth at bedtime. 60 capsule 0  . magnesium oxide (MAG-OX) 400 (241.3 Mg) MG tablet Take 1 tablet (400 mg total) by mouth daily. 30 tablet 1  . mirtazapine (REMERON) 7.5 MG tablet Take 1 tablet (7.5 mg total) by mouth at bedtime. 30 tablet 0  . Multiple Vitamin (MULTIVITAMIN WITH MINERALS) TABS tablet Take 1 tablet by mouth every other day.     Marland Kitchen omeprazole (PRILOSEC) 40 MG capsule Take 1 capsule (40 mg total) by mouth daily. 30 capsule 2  . potassium chloride SA (K-DUR,KLOR-CON) 20 MEQ  tablet Take 1 tablet (20 mEq total) by mouth 2 (two) times daily. 60 tablet 1  . acetaminophen (TYLENOL) 500 MG tablet Take 500 mg by mouth every 6 (six) hours as needed.    . lidocaine-prilocaine (EMLA) cream Apply 1 application topically as needed. (Patient not taking: Reported on 03/04/2018) 30 g 2  . lidocaine-prilocaine (EMLA) cream Apply to affected area once (Patient not taking: Reported on 03/04/2018) 30 g 3  . loperamide (IMODIUM A-D) 2 MG tablet Take 1 tablet (2 mg total) by mouth 4 (four) times daily as needed. Take 2 at diarrhea onset , then 1 every 2hr until 12hrs with no BM. May take 2 every 4hrs at night. If diarrhea recurs repeat. (Patient not taking: Reported on 03/04/2018) 100 tablet 1  . LORazepam (ATIVAN) 1 MG tablet Take 1 tablet (1 mg total) by mouth every 6 (six) hours as needed (NAUSEA). (Patient not taking: Reported on 03/04/2018) 30 tablet 0  . nystatin (MYCOSTATIN) 100000 UNIT/ML suspension Take 5 mLs (500,000 Units total) by mouth 4 (four) times daily. (Patient not taking: Reported on 03/04/2018) 473 mL 0  . ondansetron (ZOFRAN) 8 MG tablet Take 1 tablet (8 mg total) by mouth 2 (two) times daily as needed for refractory nausea / vomiting. Start on day 3 after chemotherapy. (Patient not taking: Reported on 03/04/2018) 30 tablet  1  . oxyCODONE-acetaminophen (PERCOCET/ROXICET) 5-325 MG tablet Take 1-2 tablets by mouth every 6 (six) hours as needed for severe pain. (Patient not taking: Reported on 02/14/2018) 40 tablet 0  . prochlorperazine (COMPAZINE) 10 MG tablet Take 1 tablet (10 mg total) by mouth every 6 (six) hours as needed (NAUSEA). (Patient not taking: Reported on 03/04/2018) 30 tablet 1  . ranitidine (ZANTAC) 150 MG tablet Take 150 mg by mouth 2 (two) times daily.    . traMADol (ULTRAM) 50 MG tablet Take 1-2 tablets (50-100 mg total) by mouth every 8 (eight) hours as needed. (Patient not taking: Reported on 02/14/2018) 60 tablet 0   Current Facility-Administered Medications    Medication Dose Route Frequency Provider Last Rate Last Dose  . 0.9 %  sodium chloride infusion  500 mL Intravenous Continuous Pyrtle, Lajuan Lines, MD      . 0.9 %  sodium chloride infusion  500 mL Intravenous Once Pyrtle, Lajuan Lines, MD        Physical Findings:  height is 6" (0.152 m) (abnormal) and weight is 121 lb 6.4 oz (55.1 kg). His oral temperature is 98.1 F (36.7 C). His blood pressure is 97/82 and his pulse is 98. His respiration is 20 and oxygen saturation is 100%.  Pain Assessment Pain Score: 0-No pain/10 In general this is a chronically ill appearing caucasian male in no acute distress. He's alert and oriented x4 and appropriate throughout the examination. Cardiopulmonary assessment is negative for acute distress and he exhibits normal effort.   Lab Findings: Lab Results  Component Value Date   WBC 13.0 (H) 02/27/2018   HGB 11.2 (L) 02/27/2018   HCT 33.4 (L) 02/27/2018   MCV 99.9 (H) 02/27/2018   PLT 269 02/27/2018     Radiographic Findings: No results found.  Impression/Plan: 1.  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 The patient will continue on systemic therapy and follow up with Dr. Burr Medico during his course. We will see him as needed moving forward.  2. Xerostomia and buccal pain. The patient was given some OTC medication information including Xylimelts and H-B12 information.        Carola Rhine, PAC

## 2018-03-11 ENCOUNTER — Encounter: Payer: Self-pay | Admitting: Physical Therapy

## 2018-03-11 ENCOUNTER — Ambulatory Visit: Payer: BLUE CROSS/BLUE SHIELD | Admitting: Physical Therapy

## 2018-03-11 DIAGNOSIS — R252 Cramp and spasm: Secondary | ICD-10-CM

## 2018-03-11 DIAGNOSIS — M6281 Muscle weakness (generalized): Secondary | ICD-10-CM

## 2018-03-11 DIAGNOSIS — R262 Difficulty in walking, not elsewhere classified: Secondary | ICD-10-CM | POA: Diagnosis not present

## 2018-03-11 NOTE — Patient Instructions (Signed)
Access Code: [email or web address]  URL: [email or web address]  Date: 03/11/2018  Prepared by: Lovett Calender   Exercises  Tandem Stance - 10 reps - 3 sets - 1x daily - 7x weekly  Standing Hip Extension with Chair - 10 reps - 3 sets - 1x daily - 7x weekly  Standing March with Counter Support - 10 reps - 3 sets - 1x daily - 7x weekly  Standing Hip Abduction AROM - 10 reps - 3 sets - 1x daily - 7x weekly  Beginner Bridge - 10 reps - 3 sets - 1x daily - 7x weekly  Hooklying Clamshell with Resistance - 10 reps - 3 sets - 1x daily - 7x weekly  Short Arc Quad with Ankle Weight - 10 reps - 3 sets - 1x daily - 7x weekly  Hooklying Single Leg Abduction and Adduction AROM - 10 reps - 3 sets - 1x daily - 7x weekly   [facility] [address], [place], [place] [address] Phone # [phone] Fax [phone]

## 2018-03-11 NOTE — Therapy (Signed)
[facility] [address], [place], [place], [address] Phone: [phone]   Fax:  [phone]  Physical Therapy Treatment  Patient Details  Name: David Clarke MRN: [id number] Date of Birth: 01/21/1956 Referring Provider: Truitt Merle, MD   Encounter Date: 03/11/2018  PT End of Session - 03/11/18 1129    Visit Number  2    Date for PT Re-Evaluation  05/21/18    PT Start Time  1118    PT Stop Time  1142    PT Time Calculation (min)  24 min    Activity Tolerance  Patient limited by fatigue    Behavior During Therapy  [facility] for tasks assessed/performed       Past Medical History:  Diagnosis Date  . Allergy   . Cancer ([place]) 10/09/2016   rectal adenocarcinoma  . Inguinal hernia     Past Surgical History:  Procedure Laterality Date  . COLONOSCOPY W/ BIOPSIES Bilateral 10/09/2016   rectal adenocarcinoma  . IR FLUORO GUIDE PORT INSERTION RIGHT  11/13/2017  . IR US GUIDE VASC ACCESS RIGHT  11/13/2017  . MOUTH SURGERY     age 62, to remove extra "eye" teeth  . [place]om teeth extraction    . XI ROBOT ABDOMINAL PERINEAL RESECTION N/A 02/21/2017   Procedure: XI ROBOT ABDOMINOPERINEAL RESECTION WITH COLOSTOMY;  Surgeon: Michael Boston, MD;  Location: [facility];  Service: General;  Laterality: N/A;    There were no vitals filed for this visit.  Subjective Assessment - 03/11/18 1121    Subjective  Pt has been more fatigued since his last infusion of chemo for cancer treatment.      Patient Stated Goals  build up strength in right leg    Currently in Pain?  No/denies                       [facility] Adult PT Treatment/Exercise - 03/11/18 0001      Lumbar Exercises: Supine   Ab Set  20 reps;3 seconds    Bent Knee Raise  5 reps    Bridge  20 reps;2 seconds    Other Supine Lumbar Exercises  hook lying LE drop out - 10x each    Other Supine Lumbar Exercises  UE alternating flexion and bilateral flexion while keeping TrA  engaged      Knee/Hip Exercises: Aerobic   Stationary Bike  L1 x 2 min             PT Education - 03/11/18 1146    Education Details   Access Code: [email or web address]     Person(s) Educated  Patient    Methods  Explanation;Demonstration;Handout    Comprehension  Verbalized understanding;Returned demonstration       PT Short Term Goals - 03/03/18 1103      PT SHORT TERM GOAL #1   Title  independent with flexibility exercises to improve elongation of the hip and thigh muscles    Time  4    Period  Weeks    Status  New    Target Date  03/26/18      PT SHORT TERM GOAL #2   Title  pt will be able to perform single leg stand on Rt LE for at least 4 seconds without UE support    Time  4    Period  Weeks    Status  New    Target Date  03/26/18      PT SHORT  TERM GOAL #3   Title  pt will perform 6 min walk test and ambulate 1350 feet or more due to improved endurance    Time  4    Period  Weeks    Status  New    Target Date  03/26/18        PT Long Term Goals - 03/03/18 1106      PT LONG TERM GOAL #1   Title  independent with HEP and understand how to progress himself    Time  12    Period  Weeks    Status  New    Target Date  05/21/18      PT LONG TERM GOAL #2   Title  6 min walk test 1600 feet or more due to improved strength and endurance    Time  12    Period  Weeks    Status  New    Target Date  05/21/18      PT LONG TERM GOAL #3   Title  ability to be up walking and standing for 45 minutes or more due to improved endurance    Time  12    Period  Weeks    Status  New    Target Date  05/21/18      PT LONG TERM GOAL #4   Title  ability to lift 25# with correct bodymechanics due to LE strength >/= 4+/5    Time  12    Period  Weeks    Status  New    Target Date  05/21/18      PT LONG TERM GOAL #5   Title  pt will report 25% reduction in nerve related symptoms such as numbness in Rt thigh    Time  12    Period  Weeks    Status  New    Target Date   05/21/18            Plan - 03/11/18 1356    Clinical Impression Statement  Pt arrived late today.  Pt was limited by fatigue due to complication from chemo infusion. Pt did well with exercises.  He was unable to perform bent knee flexion in supine on right side due to muslce weakness. Pt will continue to benefit from skilled PT to work on strength and endurance.    PT Treatment/Interventions  ADLs/Self Care Home Management;Biofeedback;Pensions consultant;Therapeutic activities;Therapeutic exercise;Neuromuscular re-education;Balance training;Patient/family education;Manual techniques;Dry needling;Manual lymph drainage;Passive range of motion;Taping;Energy conservation    PT Next Visit Plan  endurance, LE ROM and strength, lumbar, glute, and hamstring stretches    PT Home Exercise Plan  Access Code: [email or web address]     Consulted and Agree with Plan of Care  Patient       Patient will benefit from skilled therapeutic intervention in order to improve the following deficits and impairments:  Decreased endurance, Decreased range of motion, Decreased strength, Difficulty walking  Visit Diagnosis: Difficulty in walking, not elsewhere classified  Muscle weakness (generalized)  Cramp and spasm     Problem List Patient Active Problem List   Diagnosis Date Noted  . Port-A-Cath in place 02/27/2018  . Goals of care, counseling/discussion 10/30/2017  . 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 Fever   . Dehydration   . Diarrhea   . 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 Hypokalemia 12/03/2016  . Leukopenia 12/03/2016  . Prolonged Q-T interval on ECG 12/03/2016  . Oral thrush 12/03/2016  . Hemochromatosis 11/19/2016  .  Family history of hemochromatosis 10/25/2016  . Iron deficiency anemia due to chronic blood loss 10/25/2016  . Rectal adenocarcinoma ([place]) 10/05/2016    Zannie Cove, PT 03/11/2018, 2:01 PM  [facility] [address] [place] [address], [place], [place], [address] Phone: [phone]   Fax:  [phone]  Name: David Clarke MRN: [id number] Date of Birth: 09-15-1955

## 2018-03-12 ENCOUNTER — Telehealth: Payer: Self-pay

## 2018-03-12 ENCOUNTER — Encounter: Payer: Self-pay | Admitting: Hematology

## 2018-03-12 NOTE — Telephone Encounter (Signed)
Patient calls requesting Dr. Burr Medico call him at her convenience.  Has questions regarding treatment/infusion options. He has appointment on Thursday.  His [phone]

## 2018-03-13 ENCOUNTER — Ambulatory Visit: Payer: BLUE CROSS/BLUE SHIELD | Admitting: Physical Therapy

## 2018-03-13 DIAGNOSIS — M6281 Muscle weakness (generalized): Secondary | ICD-10-CM

## 2018-03-13 DIAGNOSIS — R252 Cramp and spasm: Secondary | ICD-10-CM

## 2018-03-13 DIAGNOSIS — R262 Difficulty in walking, not elsewhere classified: Secondary | ICD-10-CM

## 2018-03-13 NOTE — Therapy (Signed)
[facility] [address] [place], [place], [address] Phone: [phone]   Fax:  [phone]  Physical Therapy Treatment  Patient Details  Name: David Clarke MRN: [id number] Date of Birth: 08/20/1956 Referring Provider: Truitt Merle, MD   Encounter Date: 03/13/2018  PT End of Session - 03/13/18 1153    Visit Number  3    Date for PT Re-Evaluation  05/21/18    PT Start Time  1149    PT Stop Time  1230    PT Time Calculation (min)  41 min    Activity Tolerance  Patient limited by fatigue    Behavior During Therapy  [facility] for tasks assessed/performed       Past Medical History:  Diagnosis Date  . Allergy   . Cancer ([place]) 10/09/2016   rectal adenocarcinoma  . Inguinal hernia     Past Surgical History:  Procedure Laterality Date  . COLONOSCOPY W/ BIOPSIES Bilateral 10/09/2016   rectal adenocarcinoma  . IR FLUORO GUIDE PORT INSERTION RIGHT  11/13/2017  . IR US GUIDE VASC ACCESS RIGHT  11/13/2017  . MOUTH SURGERY     age 14, to remove extra "eye" teeth  . wisdoom teeth extraction    . XI ROBOT ABDOMINAL PERINEAL RESECTION N/A 02/21/2017   Procedure: XI ROBOT ABDOMINOPERINEAL RESECTION WITH COLOSTOMY;  Surgeon: Michael Boston, MD;  Location: [facility];  Service: General;  Laterality: N/A;    There were no vitals filed for this visit.  Subjective Assessment - 03/13/18 1419    Subjective  Pt reports he is feeling better today than the other day.    Currently in Pain?  No/denies                       [facility] Adult PT Treatment/Exercise - 03/13/18 0001      Lumbar Exercises: Supine   Heel Slides  20 reps    Bridge  20 reps;2 seconds red band    Other Supine Lumbar Exercises  hook lying LE drop out - 10x each      Knee/Hip Exercises: Standing   Hip Flexion  Stengthening;Right;Left;20 reps no UE support - tapping 6" step    Hip Abduction  Stengthening;Right;Left;15 reps;Knee straight    Forward Step Up   Right;15 reps;Hand Hold: 1;Step Height: 2"      Knee/Hip Exercises: Seated   Heel Slides  Strengthening;Right slide out and lift Right LE    Clamshell with TheraBand  Red 20 reps    Hamstring Curl  Strengthening;Right;20 reps;Limitations    Hamstring Limitations  red band    Sit to [employer]  20 reps      Shoulder Exercises: Standing   Extension  Strengthening;Both;20 reps;Theraband    Theraband Level (Shoulder Extension)  Level 1 (Yellow)    Row  Strengthening;Right;Left;20 reps;Theraband    Theraband Level (Shoulder Row)  Level 1 (Yellow)    Other Standing Exercises  UE ranger level 35 with stepping into flexion - 25 x each side               PT Short Term Goals - 03/03/18 1103      PT SHORT TERM GOAL #1   Title  independent with flexibility exercises to improve elongation of the hip and thigh muscles    Time  4    Period  Weeks    Status  New    Target Date  03/26/18      PT  SHORT TERM GOAL #2   Title  pt will be able to perform single leg stand on Rt LE for at least 4 seconds without UE support    Time  4    Period  Weeks    Status  New    Target Date  03/26/18      PT SHORT TERM GOAL #3   Title  pt will perform 6 min walk test and ambulate 1350 feet or more due to improved endurance    Time  4    Period  Weeks    Status  New    Target Date  03/26/18        PT Long Term Goals - 03/03/18 1106      PT LONG TERM GOAL #1   Title  independent with HEP and understand how to progress himself    Time  12    Period  Weeks    Status  New    Target Date  05/21/18      PT LONG TERM GOAL #2   Title  6 min walk test 1600 feet or more due to improved strength and endurance    Time  12    Period  Weeks    Status  New    Target Date  05/21/18      PT LONG TERM GOAL #3   Title  ability to be up walking and standing for 45 minutes or more due to improved endurance    Time  12    Period  Weeks    Status  New    Target Date  05/21/18      PT LONG TERM GOAL #4    Title  ability to lift 25# with correct bodymechanics due to LE strength >/= 4+/5    Time  12    Period  Weeks    Status  New    Target Date  05/21/18      PT LONG TERM GOAL #5   Title  pt will report 25% reduction in nerve related symptoms such as numbness in Rt thigh    Time  12    Period  Weeks    Status  New    Target Date  05/21/18            Plan - 03/13/18 1417    Clinical Impression Statement  Pt was able to tolerate more exercises today.  Pt continues to have the most weakness with right hip flexion.  He will benefit from skilled PT to cont to address strength and endurance deficits.    PT Treatment/Interventions  ADLs/Self Care Home Management;Biofeedback;Pensions consultant;Therapeutic activities;Therapeutic exercise;Neuromuscular re-education;Balance training;Patient/family education;Manual techniques;Dry needling;Manual lymph drainage;Passive range of motion;Taping;Energy conservation    PT Next Visit Plan  endurance, LE ROM and strength, lumbar, glute, and hamstring stretches    PT Home Exercise Plan  Access Code: [email or web address]     Consulted and Agree with Plan of Care  Patient       Patient will benefit from skilled therapeutic intervention in order to improve the following deficits and impairments:  Decreased endurance, Decreased range of motion, Decreased strength, Difficulty walking  Visit Diagnosis: Difficulty in walking, not elsewhere classified  Muscle weakness (generalized)  Cramp and spasm     Problem List Patient Active Problem List   Diagnosis Date Noted  . Port-A-Cath in place 02/27/2018  . Goals of care, counseling/discussion 10/30/2017  . 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 Fever   .  Dehydration   . Diarrhea   . 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 Hypokalemia 12/03/2016  . Leukopenia 12/03/2016  . Prolonged Q-T interval on ECG 12/03/2016  . Oral thrush 12/03/2016   . Hemochromatosis 11/19/2016  . Family history of hemochromatosis 10/25/2016  . Iron deficiency anemia due to chronic blood loss 10/25/2016  . Rectal adenocarcinoma ([place]) 10/05/2016    Zannie Cove, PT 03/13/2018, 2:20 PM  [facility] [address], [place], [place], [address] Phone: [phone]   Fax:  [phone]  Name: David Clarke MRN: [id number] Date of Birth: March 18, 1956

## 2018-03-14 ENCOUNTER — Ambulatory Visit: Payer: BLUE CROSS/BLUE SHIELD

## 2018-03-14 ENCOUNTER — Inpatient Hospital Stay: Payer: BLUE CROSS/BLUE SHIELD

## 2018-03-14 ENCOUNTER — Telehealth: Payer: Self-pay

## 2018-03-14 ENCOUNTER — Inpatient Hospital Stay (HOSPITAL_BASED_OUTPATIENT_CLINIC_OR_DEPARTMENT_OTHER): Payer: BLUE CROSS/BLUE SHIELD | Admitting: Hematology

## 2018-03-14 ENCOUNTER — Other Ambulatory Visit: Payer: BLUE CROSS/BLUE SHIELD

## 2018-03-14 ENCOUNTER — Encounter: Payer: Self-pay | Admitting: Hematology

## 2018-03-14 DIAGNOSIS — L270 Generalized skin eruption due to drugs and medicaments taken internally: Secondary | ICD-10-CM

## 2018-03-14 DIAGNOSIS — F5089 Other specified eating disorder: Secondary | ICD-10-CM

## 2018-03-14 DIAGNOSIS — Z9221 Personal history of antineoplastic chemotherapy: Secondary | ICD-10-CM

## 2018-03-14 DIAGNOSIS — C787 Secondary malignant neoplasm of liver and intrahepatic bile duct: Secondary | ICD-10-CM

## 2018-03-14 DIAGNOSIS — C20 Malignant neoplasm of rectum: Secondary | ICD-10-CM

## 2018-03-14 DIAGNOSIS — D500 Iron deficiency anemia secondary to blood loss (chronic): Secondary | ICD-10-CM

## 2018-03-14 DIAGNOSIS — Z95828 Presence of other vascular implants and grafts: Secondary | ICD-10-CM

## 2018-03-14 DIAGNOSIS — Z148 Genetic carrier of other disease: Secondary | ICD-10-CM

## 2018-03-14 DIAGNOSIS — K922 Gastrointestinal hemorrhage, unspecified: Secondary | ICD-10-CM

## 2018-03-14 DIAGNOSIS — E876 Hypokalemia: Secondary | ICD-10-CM

## 2018-03-14 DIAGNOSIS — C7989 Secondary malignant neoplasm of other specified sites: Secondary | ICD-10-CM

## 2018-03-14 DIAGNOSIS — R530 Neoplastic (malignant) related fatigue: Secondary | ICD-10-CM

## 2018-03-14 DIAGNOSIS — Z923 Personal history of irradiation: Secondary | ICD-10-CM

## 2018-03-14 DIAGNOSIS — C778 Secondary and unspecified malignant neoplasm of lymph nodes of multiple regions: Secondary | ICD-10-CM

## 2018-03-14 DIAGNOSIS — C7800 Secondary malignant neoplasm of unspecified lung: Secondary | ICD-10-CM

## 2018-03-14 DIAGNOSIS — G620 Drug-induced polyneuropathy: Secondary | ICD-10-CM

## 2018-03-14 LAB — COMPREHENSIVE METABOLIC PANEL
ALBUMIN: 3.2 g/dL — AB (ref 3.5–5.0)
ALT: 14 U/L (ref 0–44)
AST: 31 U/L (ref 15–41)
Alkaline Phosphatase: 344 U/L — ABNORMAL HIGH (ref 38–126)
Anion gap: 11 (ref 5–15)
BUN: 4 mg/dL — AB (ref 8–23)
CHLORIDE: 101 mmol/L (ref 98–111)
CO2: 25 mmol/L (ref 22–32)
CREATININE: 0.68 mg/dL (ref 0.61–1.24)
Calcium: 9.1 mg/dL (ref 8.9–10.3)
GFR calc Af Amer: >60 mL/min (ref >60)
GLUCOSE: 116 mg/dL — AB (ref 70–99)
POTASSIUM: 3.2 mmol/L — AB (ref 3.5–5.1)
SODIUM: 137 mmol/L (ref 135–145)
Total Bilirubin: 0.3 mg/dL (ref 0.3–1.2)
Total Protein: 6.9 g/dL (ref 6.5–8.1)

## 2018-03-14 LAB — CBC WITH DIFFERENTIAL/PLATELET
Basophils Absolute: 0.1 10*3/uL (ref 0.0–0.1)
Basophils Relative: 0 %
EOS ABS: 0 10*3/uL (ref 0.0–0.5)
EOS PCT: 0 %
HCT: 33.4 % — ABNORMAL LOW (ref 38.4–49.9)
Hemoglobin: 10.9 g/dL — ABNORMAL LOW (ref 13.0–17.1)
LYMPHS ABS: 0.7 10*3/uL — AB (ref 0.9–3.3)
Lymphocytes Relative: 3 %
MCH: 32.1 pg (ref 27.2–33.4)
MCHC: 32.6 g/dL (ref 32.0–36.0)
MCV: 98.7 fL — AB (ref 79.3–98.0)
MONO ABS: 1.9 10*3/uL — AB (ref 0.1–0.9)
MONOS PCT: 9 %
Neutro Abs: 17.5 10*3/uL — ABNORMAL HIGH (ref 1.5–6.5)
Neutrophils Relative %: 88 %
PLATELETS: 327 10*3/uL (ref 140–400)
RBC: 3.38 MIL/uL — ABNORMAL LOW (ref 4.20–5.82)
RDW: 17.5 % — ABNORMAL HIGH (ref 11.0–14.6)
WBC: 20.1 10*3/uL — ABNORMAL HIGH (ref 4.0–10.3)

## 2018-03-14 LAB — FERRITIN: FERRITIN: 891 ng/mL — AB (ref 24–336)

## 2018-03-14 LAB — MAGNESIUM: Magnesium: 1.4 mg/dL — CL (ref 1.7–2.4)

## 2018-03-14 LAB — CEA (IN HOUSE-CHCC): CEA (CHCC-IN HOUSE): 10.76 ng/mL — AB (ref 0.00–5.00)

## 2018-03-14 LAB — IRON AND TIBC
Iron: 46 ug/dL (ref 42–163)
SATURATION RATIOS: 20 % — AB (ref 42–163)
TIBC: 233 ug/dL (ref 202–409)
UIBC: 187 ug/dL

## 2018-03-14 MED ORDER — SODIUM CHLORIDE 0.9% FLUSH
10.0000 mL | Freq: Once | INTRAVENOUS | Status: AC
  Administered 2018-03-14: 10 mL
  Filled 2018-03-14: qty 10

## 2018-03-14 MED ORDER — MIRTAZAPINE 7.5 MG PO TABS: 7.5000 mg | ORAL_TABLET | Freq: Every day | ORAL | 3 refills | Status: DC

## 2018-03-14 MED ORDER — HEPARIN SOD (PORK) LOCK FLUSH 100 UNIT/ML IV SOLN
500.0000 [IU] | Freq: Once | INTRAVENOUS | Status: AC
  Administered 2018-03-14: 500 [IU]
  Filled 2018-03-14: qty 5

## 2018-03-14 MED ORDER — CLINDAMYCIN PHOSPHATE 1 % EX GEL: Freq: Two times a day (BID) | CUTANEOUS | 1 refills | Status: DC

## 2018-03-14 NOTE — Telephone Encounter (Signed)
Printed avs and calender of upcoming appointment. Per 7/18 los 

## 2018-03-14 NOTE — Progress Notes (Signed)
[place]  [phone] [phone]  Clinic Follow Up Note   Patient Care Team: Robyn Haber, MD as PCP - General (Family Medicine) Michael Boston, MD as Consulting Physician (General Surgery) Pyrtle, Lajuan Lines, MD as Consulting Physician (Gastroenterology) Truitt Merle, MD as Consulting Physician (Hematology and Oncology) Kyung Rudd, MD as Consulting Physician (Radiation Oncology)   Date of Service:  03/14/2018  CHIEF COMPLAINTS:  Follow up rectal adenocarcinoma  Oncology History   Cancer Staging Rectal adenocarcinoma [facility]) Staging form: Colon and Rectum, AJCC 8th Edition - Clinical stage from 10/09/2016: Stage IIIB (cT3, cN1b, cM0) - Signed by Truitt Merle, MD on 10/25/2016 - Pathologic stage from 02/21/2017: Stage IIA (ypT3, pN0, cM0) - Signed by Truitt Merle, MD on 10/18/2017       Rectal adenocarcinoma ([place])   10/03/2016 - 10/03/2016 Hospital Admission    Admit date: 10/03/16 The patient presented to the [facility] ED with rectal bleeding and difficulty with hygiene.      10/09/2016 Initial Diagnosis    Rectal adenocarcinoma       10/09/2016 Procedure    Colonoscopy showed a fungating, infiltrative and ulcerated partially obstructing large mass in the distal rectum, measuring 9 cm, prolapsing at anal canal. Scope not advanced proximal to the rectosigmoid colon.      10/09/2016 Initial Biopsy    Rectal mass biopsy showed invasive adenocarcinoma.      10/13/2016 Imaging    CT Chest Abdomen Pelvis w contrast IMPRESSION: Large irregular enhancing rectal mass consistent with known rectal cancer. There are small perirectal and small pelvic sidewall lymph nodes which are suspicious. Prominent perirectal vessels are also noted. No evidence of metastatic retroperitoneal or sigmoid mesocolon adenopathy or hepatic or pulmonary metastasis.      10/24/2016 Imaging    MRI Pelvis IMPRESSION: 1. Moderately motion degraded exam. 2. Large mass is centered at  the anus with extension into the low rectum. No complicating obstruction. 3. Mild mesorectal adenopathy, suspicious. 4. Right inguinal hernia containing nonobstructive small bowel.       11/08/2016 - 12/19/2016 Chemotherapy    Xeloda 50 mg twice daily, with concurrent radiation, held since 12/03/2016 due to colitis and hospitalization      11/08/2016 - 12/19/2016 Radiation Therapy    Neoadjuvant radiation to his rectal cancer  1) Pelvis/ 45 Gy in 25 fractions  2) Rectum boost/ 9 Gy in 5 fractions Under the care of Dr. Lisbeth Renshaw.      12/03/2016 - 12/09/2016 Hospital Admission    He presented with few days to one week history of diarrhea with 6-7 loose stools daily, along with a fever of 101.5 on the day of admission. Patient was also noted to be hypokalemic, hyponatremic. Hospitalized for further management. Stool studies were sent. C. difficile was negative. GI pathogen panel was positive for Yersinia.  XELODA was held at this time, until infection has cleared.       12/07/2016 Imaging    CT A/P W Contrast  IMPRESSION: 1. Abnormal dilatation of the proximal small bowel loops worrisome for either small bowel obstruction versus small bowel ileus. 2. 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 3. Large right inguinal hernia containing edematous loop of distal small bowel 4. No pneumatosis or bowel perforation.  No abscess. 5. Aortic atherosclerosis 6. Mild decrease in size of rectal mass. No change in right common iliac adenopathy.      02/21/2017 Surgery  XI ROBOT ABDOMINOPERINEAL RESECTION WITH COLOSTOMY by Dr. Johney Maine      02/21/2017 Pathology Results    Diagnosis 02/21/17 1. Colon, segmental resection for tumor, rectosigmoid - INVASIVE ADENOCARCINOMA, MODERATELY DIFFERENTIATED, SPANNING 3.9 CM. - TUMOR INVADES THROUGH MUSCULARIS PROPRIA. - RESECTION MARGINS ARE NEGATIVE. - EIGHT OF  EIGHT LYMPH NODES NEGATIVE FOR CARCINOMA (0/8), SEE COMMENT. - SEE ONCOLOGY TABLE. 2. Soft tissue, biopsy, left lateral pelvic floor - BENIGN SKELETAL MUSCLE AND SOFT TISSUE. - NO MALIGNANCY IDENTIFIED. 3. Colon, segmental resection, proximal sigmoid - BENIGN COLONIC MUCOSA. - NO DYSPLASIA OR MALIGNANCY. 4. Liver, biopsy, liver mass - BENIGN VASCULAR PROLIFERATION CONSISTENT WITH HEMANGIOMA. - NO MALIGNANCY IDENTIFIED.      04/04/2017 - 08/12/2017 Chemotherapy    CAPOX every 3 weeks for 6 cycles. I decreased his first cycle of oxaliplatin platinum by 15%, he tolerated well, so we increased to full dose from cycle 2. He completed 5 cycles of oxaliplatin on 07/16/17 and completed Xeloda on 08/12/17.        08/15/2017 Procedure    Colonoscopy 08/15/17 IMPRESSION - Patent end colostomy with healthy appearing mucosa in the sigmoid colon. - The entire examined colon is normal. - No specimens collected.      09/18/2017 Imaging    CT CAP W Contrast 09/18/17 IMPRESSION: Interval abdomino-peritoneal resection with left lower quadrant colostomy. Increased diffuse cystitis, likely due to radiation. New presacral soft tissue density with central low attenuation, also suspicious for post treatment changes. Recommend continued attention on follow-up imaging. New mild retroperitoneal lymphadenopathy in the aortocaval space, measuring up to 1.8 cm. This is suspicious for metastatic disease, although it is conceivable that this could be reactive in etiology due to interval surgery and radiation therapy. Recommend short-term follow-up by CT in 3 months. New indeterminate 4 mm left upper lobe pulmonary nodule, likely inflammatory in etiology with metastatic disease considered less likely. Recommend continued attention on follow-up CT. Bilateral inguinal hernias, largest on the right containing several small bowel loops.      10/17/2017 PET scan    MPRESSION: Increased hypermetabolic  lymphadenopathy in abdominal retroperitoneum, right common iliac chain, and left supraclavicular region, consistent with metastatic disease. Stable tiny bilateral pulmonary nodules show no FDG uptake but are too small to characterize by PET. Recommend continued attention on follow-up CT. 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      10/29/2017 Relapse/Recurrence    Diagnosis Lymph node, needle/core biopsy, left supraclavicular - METASTATIC ADENOCARCINOMA, CONSISTENT WITH COLORECTAL PRIMARY. - SEE COMMENT.      11/20/2017 - 01/17/2018 Chemotherapy    FOLFIRI every 2 weeks, panitumumab was added from cycle 3.  FOLFIRI was held after cycle 3 due to disease progression, he continued with 5-FU and Panitumumab until further disease progression.        01/08/2018 Imaging    CT CAP with contrast IMPRESSION: 1. Today's study demonstrates dramatic progression of disease with interval development of widespread pulmonary metastases, multiple new liver lesions and progressive lymphadenopathy noted in the pelvis, retroperitoneum and left supraclavicular nodal stations. In addition, the previously noted metastatic lesion in the right psoas muscle has markedly increased in size. Whether or not this reflects growth of the lesion and/or internal hemorrhage of the lesion is uncertain. 2. Stable postoperative thickening and small postoperative fluid collection in the presacral space, as above, following abdominal perineal resection. 3. Large right inguinal hernia containing several loops of small bowel, without evidence of bowel incarceration or obstruction at this time.  01/14/2018 - 01/24/2018 Radiation Therapy    Radiation with Dr. Lisbeth Renshaw starting 01/14/18. Plan to complete on 01/24/18      01/31/2018 -  Chemotherapy    FOLFIRINOX plus Vectibix every 2 weeks starting 01/31/18      HISTORY OF PRESENTING ILLNESS (10/25/16):  David Clarke 62 y.o. male is here  because of his recently diagnosed rectal adenocarcinoma. He was referred by his gastroenterologist title parietal. Patient presents to my clinic, accompanied by his wife and his daughter.       He has noticed a anal mass accompanied by intermittent bleeding and pain in the rectum for the past 5 months. The patient initially presented to the [facility] ED on 10/03/16 with rectal bleeding and difficulty with hygiene. He was without health insurance previously and so did not pursue evaluation or treatment until this time.  The patient was seen by Dr. Hilarie Fredrickson for a colonoscopy and biopsy on 10/09/16, revealing a large partially obstructing low rectal mass, prolapsing to anal verge. Biopsy showed adenocarcinoma. CT C/A/P on 10/13/16 revealed a large, irregular, enhancing mass consistent with known rectal cancer. There were several suspicious small perirectal and small pelvic sidewall lymph nodes. Prominent perirectal vessels were also noted. MRI of the pelvis on 10/24/16 showed a large mass centered at the anus with extension into the low rectum without obstruction. There was suspicious mild mesorectal adenopathy, and a right inguinal hernia containing nonobstructive small bowel.   The patient comes to the clinic for consultation today accompanied by his partner and daughter. He denies abdominal cramps, nausea, bowel or bladder concerns. He reports taking OTC stool softener as needed. He reports his appetite is good, but he has lost 12-15 lbs in the past few weeks. His daughter notes she has hemochromatosis and questions if this could be playing a role in the patient's anemia.  CURRENT THERAPY:  FOFIRINOX with dose reduction plus panitumumab every 2 weeks started on 01/31/18    INTERIM HISTORY:   REYNOLDO MAINER is a 62 y.o. male who presents for follow up. He presents to the clinic today with his wife.   He notes he had thrush which progressed to where he could not eat. He then took nystatin and thrush has now  resolved and he can eat again starting a few days ago. Due to that, he would like to postpone his chemo treatment today to next week.  He notes his biggest issues with last cycle his symptoms are beginning to accumulates. He has sensitivity in his nailbeds, skin peeling and cuts, skin acne rash has spread to b/l arms with no itching. He notes the skin is dry and scaly. He is still using clindamycin and hydrocortisone cream.   He has intermittent cold sensitivity and neuropathy in the feet with is intermittent. Numbness of legs are improved with PT and allow him to ambulate better. He denies having chemo related pain.    On review of symptoms, pt notes dry and scaly skin, skin peeling, intermittent cold sensitivity and neuropathy in feet.     MEDICAL HISTORY:  Past Medical History:  Diagnosis Date  . Allergy   . Cancer ([place]) 10/09/2016   rectal adenocarcinoma  . Inguinal hernia     SURGICAL HISTORY: Past Surgical History:  Procedure Laterality Date  . COLONOSCOPY W/ BIOPSIES Bilateral 10/09/2016   rectal adenocarcinoma  . IR FLUORO GUIDE PORT INSERTION RIGHT  11/13/2017  . IR US GUIDE VASC ACCESS RIGHT  11/13/2017  . MOUTH SURGERY  age 27, to remove extra "eye" teeth  . [place] teeth extraction    . XI ROBOT ABDOMINAL PERINEAL RESECTION N/A 02/21/2017   Procedure: XI ROBOT ABDOMINOPERINEAL RESECTION WITH COLOSTOMY;  Surgeon: Michael Boston, MD;  Location: [facility];  Service: General;  Laterality: N/A;    SOCIAL HISTORY: Social History   Socioeconomic History  . Marital status: Soil scientist    Spouse name: Not on file  . Number of children: 1  . Years of education: Not on file  . Highest education level: Not on file  Occupational History  . Occupation: Architectural technologist  Social Needs  . Financial resource strain: Not on file  . Food insecurity:    Worry: Not on file    Inability: Not on file  . Transportation needs:    Medical: Not on file    Non-medical: Not on  file  Tobacco Use  . Smoking status: Former Smoker    Years: 2.00    Types: Cigarettes    Last attempt to quit: 2002    Years since quitting: 17.5  . Smokeless tobacco: Never Used  Substance and Sexual Activity  . Alcohol use: Not Currently    Alcohol/week: 6.0 oz    Types: 4 Glasses of wine, 6 Cans of beer per week    Comment: beer or a glass or wine most days of the week  . Drug use: No  . Sexual activity: Yes    Partners: Female  Lifestyle  . Physical activity:    Days per week: Not on file    Minutes per session: Not on file  . Stress: Not on file  Relationships  . Social connections:    Talks on phone: Not on file    Gets together: Not on file    Attends religious service: Not on file    Active member of club or organization: Not on file    Attends meetings of clubs or organizations: Not on file    Relationship status: Not on file  . Intimate partner violence:    Fear of current or ex partner: Not on file    Emotionally abused: Not on file    Physically abused: Not on file    Forced sexual activity: Not on file  Other Topics Concern  . Not on file  Social History Narrative  . Not on file    FAMILY HISTORY: Family History  Problem Relation Age of Onset  . Other Mother        bile duct tumor  . Stroke Mother   . Cancer Mother 27       bile duct cancer   . Prostate cancer Father 47  . Emphysema Father   . Colon cancer Neg Hx   . Esophageal cancer Neg Hx   . Pancreatic cancer Neg Hx   . Rectal cancer Neg Hx   . Stomach cancer Neg Hx     ALLERGIES:  is allergic to sulfa antibiotics.  MEDICATIONS:  Current Outpatient Medications  Medication Sig Dispense Refill  . acetaminophen (TYLENOL) 500 MG tablet Take 500 mg by mouth every 6 (six) hours as needed.    . clindamycin (CLINDAGEL) 1 % gel Apply topically 2 (two) times daily. 60 g 1  . gabapentin (NEURONTIN) 100 MG capsule Take 1 capsule (100 mg total) by mouth at bedtime. 60 capsule 0  . magnesium oxide  (MAG-OX) 400 (241.3 Mg) MG tablet Take 1 tablet (400 mg total) by mouth daily. 30 tablet 1  . mirtazapine (  REMERON) 7.5 MG tablet Take 1 tablet (7.5 mg total) by mouth at bedtime. 30 tablet 3  . Multiple Vitamin (MULTIVITAMIN WITH MINERALS) TABS tablet Take 1 tablet by mouth every other day.     . nystatin (MYCOSTATIN) 100000 UNIT/ML suspension Take 5 mLs (500,000 Units total) by mouth 4 (four) times daily. 473 mL 0  . omeprazole (PRILOSEC) 40 MG capsule Take 1 capsule (40 mg total) by mouth daily. 30 capsule 2  . potassium chloride SA (K-DUR,KLOR-CON) 20 MEQ tablet Take 1 tablet (20 mEq total) by mouth 2 (two) times daily. 60 tablet 1  . dexamethasone (DECADRON) 4 MG tablet Take 1 tablet (4 mg total) by mouth daily. Start the day after chemo for 3-5 days. (Patient not taking: Reported on 03/14/2018) 10 tablet 2  . lidocaine-prilocaine (EMLA) cream Apply 1 application topically as needed. (Patient not taking: Reported on 03/04/2018) 30 g 2  . ondansetron (ZOFRAN) 8 MG tablet Take 1 tablet (8 mg total) by mouth 2 (two) times daily as needed for refractory nausea / vomiting. Start on day 3 after chemotherapy. (Patient not taking: Reported on 03/04/2018) 30 tablet 1  . prochlorperazine (COMPAZINE) 10 MG tablet Take 1 tablet (10 mg total) by mouth every 6 (six) hours as needed (NAUSEA). (Patient not taking: Reported on 03/04/2018) 30 tablet 1   Current Facility-Administered Medications  Medication Dose Route Frequency Provider Last Rate Last Dose  . 0.9 %  sodium chloride infusion  500 mL Intravenous Continuous Pyrtle, Lajuan Lines, MD      . 0.9 %  sodium chloride infusion  500 mL Intravenous Once Pyrtle, Lajuan Lines, MD        REVIEW OF SYSTEMS:  Constitutional: Denies fevers, chills or abnormal night sweats (+) weight and appetite overall stable Eyes: Denies blurriness of vision, double vision or watery eyes Ears, nose, mouth, throat, and face: Denies mucositis or sore throat  Respiratory: Denies cough, dyspnea or  wheezes Cardiovascular: Denies palpitation, chest discomfort  Gastrointestinal:  Denies nausea  (+) colostomy bag  Skin: (+) rash on face and neck, improving but spread to b/l arms (+) dry, scaly and peeling skin Lymphatics: Denies new lymphadenopathy or easy bruising MSK: (+) right glute pain and right mid-thigh numbness, with limp, much improved (+) ambulates with cane, improved Neurological: (+) improved intermittent neuropathy in feet, mild and cold sensitivity Behavioral/Psych: Mood is stable, no new changes  All other systems were reviewed with the patient and are negative.   PHYSICAL EXAMINATION:   ECOG PERFORMANCE STATUS: 2  Vitals:   03/14/18 1037  BP: 109/75  Pulse: 84  Resp: 18  Temp: 97.9 F (36.6 C)  SpO2: 100%   Filed Weights   03/14/18 1037  Weight: 122 lb 8 oz (55.6 kg)     GENERAL: alert, no distress and comfortable. (+) Cane for ambulation SKIN: skin color, texture, turgor are normal, (+) diffuse acne-like rash on his face, neck and upper chest, no diffuse skin erythema. NECK: supple, thyroid normal size, non-tender, without nodularity LYMPH: (+) 2x 1.5 cm left supraclavicular lymph node is now barely palpable, non-tender LUNGS: clear to auscultation and percussion with normal breathing effort HEART: regular rate & rhythm and no murmurs and no lower extremity edema ABDOMEN: (+) grapefruit size mass palpated in the RLQ, has improved. abdomen soft, non-tender and normal bowel sounds (+) laparoscopic incisions all healed Musculoskeletal: no cyanosis of digits and no clubbing  PSYCH: alert & oriented x 3 with fluent speech NEURO: no focal motor/sensory deficits. (+)  mild weakness upon lifting right leg.   LABORATORY DATA:  I have reviewed the data as listed CBC Latest Ref Rng & Units 03/14/2018 02/27/2018 02/14/2018  WBC 4.0 - 10.3 K/uL 20.1(H) 13.0(H) 1.8(L)  Hemoglobin 13.0 - 17.1 g/dL 10.9(L) 11.2(L) 10.9(L)  Hematocrit 38.4 - 49.9 % 33.4(L) 33.4(L) 31.8(L)    Platelets 140 - 400 K/uL 327 269 317   CMP Latest Ref Rng & Units 03/14/2018 02/27/2018 02/14/2018  Glucose 70 - 99 mg/dL 116(H) 99 134  BUN 8 - 23 mg/dL 4(L) 9 9  Creatinine 0.61 - 1.24 mg/dL 0.68 0.72 0.71  Sodium 135 - 145 mmol/L 137 136 134(L)  Potassium 3.5 - 5.1 mmol/L 3.2(L) 3.4(L) 2.9(LL)  Chloride 98 - 111 mmol/L 101 103 99  CO2 22 - 32 mmol/L '25 24 25  '$ Calcium 8.9 - 10.3 mg/dL 9.1 9.3 8.9  Total Protein 6.5 - 8.1 g/dL 6.9 6.7 6.4  Total Bilirubin 0.3 - 1.2 mg/dL 0.3 0.3 0.5  Alkaline Phos 38 - 126 U/L 344(H) 256(H) 144  AST 15 - 41 U/L '31 23 17  '$ ALT 0 - 44 U/L '14 12 9   '$ Magnesium: 1.4      CEA ([facility])  10/27/2016: 7.81 11/27/2016: 5.56 01/10/2017: 5.61 03/14/17: 5.11 04/11/17: 5.87 05/23/17: 8.79 06/19/17:  9.15 07/16/17:  9.88 08/10/17: 10.40 09/18/17: 8.31 10/30/17: 8.11 11/20/17: 7.96 12/25/2017: 13.95 01/17/18: 10.68 02/14/18: 7.17 03/14/18: PENDING   PATHOLOGY:  10/29/17 Diagnosis Lymph node, needle/core biopsy, left supraclavicular - METASTATIC ADENOCARCINOMA, CONSISTENT WITH COLORECTAL PRIMARY. - SEE COMMENT. Microscopic Comment The malignant cells are positive for CDX-2 and cytokeratin 20. They are negative for cytokeratin 7, p63 and cytokeratin 5/6. The findings are consistent with primary colorectal adenocarcinoma. Dr. Thressa Sheller has reviewed the case and concurs with this interpretation. Dr. Burr Medico was paged on 10/30/17. Additional studies can be performed upon clinician request. (JBK:gt, 10/31/17)   Diagnosis 02/21/17 1. Colon, segmental resection for tumor, rectosigmoid - INVASIVE ADENOCARCINOMA, MODERATELY DIFFERENTIATED, SPANNING 3.9 CM. - TUMOR INVADES THROUGH MUSCULARIS PROPRIA. - RESECTION MARGINS ARE NEGATIVE. - EIGHT OF EIGHT LYMPH NODES NEGATIVE FOR CARCINOMA (0/8), SEE COMMENT. - SEE ONCOLOGY TABLE. 2. Soft tissue, biopsy, left lateral pelvic floor - BENIGN SKELETAL MUSCLE AND SOFT TISSUE. - NO MALIGNANCY IDENTIFIED. 3. Colon, segmental  resection, proximal sigmoid - BENIGN COLONIC MUCOSA. - NO DYSPLASIA OR MALIGNANCY. 4. Liver, biopsy, liver mass - BENIGN VASCULAR PROLIFERATION CONSISTENT WITH HEMANGIOMA. - NO MALIGNANCY IDENTIFIED. Microscopic Comment 1. COLON AND RECTUM (INCLUDING TRANS-ANAL RESECTION): Specimen: Rectosigmoid colon with anus. Procedure: Abdominoperineal resection. Tumor site: Distal 1/3 rectum to anus. Specimen integrity: Intact. Macroscopic intactness of mesorectum: Incomplete. Macroscopic tumor perforation: Not identified. Invasive tumor: Maximum size: 3.9 cm Histologic type(s): Invasive adenocarcinoma. Histologic grade and differentiation: G2: moderately differentiated/low grade Type of polyp in which invasive carcinoma arose: N/A. Microscopic extension of invasive tumor: Tumor invades through muscularis propria. Lymph-Vascular invasion: Not identified. 1 of 3 FINAL for GABRIELL, CASIMIR [phone]) Microscopic Comment(continued) Peri-neural invasion: Present. Tumor deposit(s) (discontinuous extramural extension): Not identified. Resection margins: Proximal margin: Negative. Distal margin: Negative. Circumferential (radial) (posterior ascending, posterior descending; lateral and posterior mid-rectum; and entire lower 1/3 rectum): Negative. Mesenteric margin (sigmoid and transverse): Negative. Distance closest margin (if all above margins negative): <0.1 cm radial margin. >1 cm distal margin. Treatment effect (neo-adjuvant therapy): Present Additional polyp(s): N/A. Non-neoplastic findings: None. Lymph nodes: number examined 8; number positive: 0 (treatment effect present). Pathologic Staging: ypT3, ypN0, ypMX Ancillary studies: Can be performed upon request. Comment: The  fat was cleared and additional lymph nodes were not identified.  Diagnosis 10/09/2016 Rectum, biopsy, mass - ADENOCARCINOMA.  RADIOGRAPHIC STUDIES: I have personally reviewed the radiological images as listed and agreed  with the findings in the report.  CT CAP with contrast, 01/08/2018 IMPRESSION: 1. Today's study demonstrates dramatic progression of disease with interval development of widespread pulmonary metastases, multiple new liver lesions and progressive lymphadenopathy noted in the pelvis, retroperitoneum and left supraclavicular nodal stations. In addition, the previously noted metastatic lesion in the right psoas muscle has markedly increased in size. Whether or not this reflects growth of the lesion and/or internal hemorrhage of the lesion is uncertain. 2. Stable postoperative thickening and small postoperative fluid collection in the presacral space, as above, following abdominal perineal resection. 3. Large right inguinal hernia containing several loops of small bowel, without evidence of bowel incarceration or obstruction at this time.  PET Scan 10/17/17 IMPRESSION: Increased hypermetabolic lymphadenopathy in abdominal retroperitoneum, right common iliac chain, and left supraclavicular region, consistent with metastatic disease. Stable tiny bilateral pulmonary nodules show no FDG uptake but are too small to characterize by PET. Recommend continued attention on follow-up CT. 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   CT CAP W Contrast 09/18/17 IMPRESSION: Interval abdomino-peritoneal resection with left lower quadrant colostomy. Increased diffuse cystitis, likely due to radiation. New presacral soft tissue density with central low attenuation, also suspicious for post treatment changes. Recommend continued attention on follow-up imaging. New mild retroperitoneal lymphadenopathy in the aortocaval space, measuring up to 1.8 cm. This is suspicious for metastatic disease, although it is conceivable that this could be reactive in etiology due to interval surgery and radiation therapy. Recommend short-term follow-up by CT in 3 months. New indeterminate  4 mm left upper lobe pulmonary nodule, likely inflammatory in etiology with metastatic disease considered less likely. Recommend continued attention on follow-up CT. Bilateral inguinal hernias, largest on the right containing several small bowel loops.   Colonoscopy 08/15/17 IMPRESSION - Patent end colostomy with healthy appearing mucosa in the sigmoid colon. - The entire examined colon is normal. - No specimens collected.  MRI Pelvis W WO Contrast 01/29/17 IMPRESSION: Significant decrease in size of soft tissue mass at the anorectal junction, with post radiation changes. No residual perirectal lymphadenopathy or other signs of pelvic metastatic disease. Stable small right inguinal hernia containing small bowel.  CT A/P W Contrast 12/07/16 IMPRESSION: 1. Abnormal dilatation of the proximal small bowel loops worrisome for either small bowel obstruction versus small bowel ileus. 2. 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 3. Large right inguinal hernia containing edematous loop of distal small bowel 4. No pneumatosis or bowel perforation.  No abscess. 5. Aortic atherosclerosis 6. Mild decrease in size of rectal mass. No change in right common iliac adenopathy.  DG Chest 2 View 12/03/2016 IMPRESSION: 1. No active cardiopulmonary disease. No evidence of pneumonia or pulmonary edema. No evidence of intrathoracic metastasis. 2. Hyperexpanded lungs suggesting COPD.  MRI Pelvis 10/24/2016 IMPRESSION: 1. Moderately motion degraded exam. 2. Large mass is centered at the anus with extension into the low rectum. No complicating obstruction. 3. Mild mesorectal adenopathy, suspicious. 4. Right inguinal hernia containing nonobstructive small bowel.  CT CAP w Contrast 10/13/2016 IMPRESSION: Large irregular enhancing rectal mass consistent with known rectal cancer. There are small  perirectal and small pelvic sidewall lymph nodes which are suspicious. Prominent perirectal vessels are also noted. No  evidence of metastatic retroperitoneal or sigmoid mesocolon adenopathy or hepatic or pulmonary metastasis.  COLONOSCOPY 10/09/2016 Dr. Hilarie Fredrickson  A fungating, infiltrative and ulcerated partially obstructing large mass was found in the distal rectum. The mass was circumferential. The mass measured seven cm in length (prolapsing and from dentate line to approx 7 cm). In addition, its inner diameter measured nine mm. Oozing was present. This was biopsied with a cold forceps for histology. - Due to the distal rectal mass the bowel preparation was poor and unable to be cleared via the adult upper endoscope (adult upper endoscope used due to obstructing mass). Scope not advanced proximal to the rectosigmoid colon.  ASSESSMENT & PLAN: DECLIN RAJAN 62 y.o.  gentleman, previously healthy, presented with an anal mass and a mild intermittent rectal bleeding for 5-6 months. Fatigue, anorexia, fatigue and weight loss for a month. He is currently on radiation for his rectal cancer, chemotherapy has been held lately.  1. Rectal Adenocarcinoma, low rectum, [id number], ypT3N0, stage IIIB, MSI-stable, nodes metastasis 09/2017, KRAS/NRAS/BRAF wild type  -I previously reviewed his CT and MRI scan, colonoscopy and biopsy findings in detail with pt and his family members -Based on his imaging findings, he had clinical stage III disease. We reviewed his image in our GI tumor Board, he also has a small liver lesion which is indeterminate, likely benign.  -patient received neoadjuvant chemotherapy and radiation with Xeloda 10/1416-12/19/16, which was held for the last few weeks due to infectious colitis. -He underwent APR on 02/21/2017, surgical margins were negative, all lymph nodes were negative. We've reviewed his surgical pathology findings he did have significant treatment effect on his surgical sample.    -He recovered well from surgery, started adjuvant CAPOX 04/04/17. He completed 5 cycles of oxaliplatin on 07/16/17 and completed Xeloda on 08/12/17. His oxaliplatin treatment was postponed a few times due to his working schedule -He unfortunately had recurrence in 09/2017 -I discussed the treatment option. Given his rapid Recurrence (2 months) after completion of adjuvant Capox, I recommend FOLFIRI as his next line therapy. -He went to [facility] for 2nd opinion with Dr. Aleatha Borer on 3/18 and understands he is not a candidate for clinical trial [id number] at [facility] and chose to proceed with current chemotherapy plan.  -He started FOLFIRI on 11/20/17 and has tolerated first cycle well.  -His FO genomic testing showed MSI stable disease, K-ras, NRAS, BRAF wild-type, no other targetable mutations.  Panitumumab was added on from cycle 3.  -I previously reviewed his 01/08/18 restaging CT scan findings with patient and his family, unfortunately he has dramatic progression of the right metastatic lesion in the right psoas muscle which is 13.9 cm, and he also developed new pulmonary, liver metastasis. We discussed this could be partially related to his delay of treatment, and he has had only 3 cycle chemo, 1 cycle of panitumumab. Unfortunately the biology of his tumor is extremely aggressive, I recommend him to consider more intense chemotherapy FOLFIRINOX every 2 weeks and continue panitumumab -Due to his worsening right flank pain, he received palliative radiation to the right psoas muscle mass.  His pain has significantly improved after radiation. -He tolerated first cycle FOLFIRINOX with dose reduction well overall, but has lost weight. I previously encouraged him to take more high-calorie and high-protein diet.  He does not think he needs to see dietitian.  He is cycle 2-3 chemo dose was further reduced due to the tolerance issue. -Given his poor chemo tolerance I will postpone treatment cycle 4 FOLFIRINOX  For 1 week. I  encouraged him to take time to rest and increase calorie intake.  -Plan to rescan after cycle 5.  -F/u in 2 weeks to monitor tolerance.   2. 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 This was confirmed by his iron study. -His daughter was diagnosed with hemochromatosis (homozygous), his genetic testing showed he is a carrier -He received IV iron once, he previously took some oral iron, I'll be cautious about IV iron replacement due to his hemachromatosis carrier status. -CBC and iron level are closely monitored.  -Hg stable at 10.9 today (03/14/18)   3. Peripheral neuropathy, secondary to chemo, G1   -mild numbness, no decreased sensation on exam, hand function is normal  -He had slightly more noticeable neuropathy, but hand functions are normal and adequate for treatment -Neuropathy in feet mild and intermittent, continue Neurontin and B complex  4.Goal of care discussion  -We again discussed the incurable nature of his cancer, and the overall poor prognosis due to the metastatic disease, especially if he does not have good response to chemotherapy or progress on chemo -The patient understands the goal of care is palliative, to prolong his life. -He is full code for now   5. Right sacral and anterolateral upper leg pain , now resolved, with associated numbness to mid-thigh   -Likely secondary to right iliac adenopathy and psoas involvement. Has been taking tramadol 2 tabs BID and supplementing with tylenol as well as integrating non-pharm measures.  -Pain is well managed but interfering with sleep. His work requires him to be on his feet for prolonged periods. He does not like "fog" and constipation effects of tramadol, therefore not interested in escalating opioids at this time.  -Pain resolved after  palliative radiation. He no longer is taking oxycodone, just Tylenol and CBD oil pen.  -PT is helping with his balance and strength, numbness in legs and ambulating improved.   6. Constipation  -He uses Senakot and miralax PRN and will use magnesium citrate as a back up if he still has no relief.  -He understands indications for using laxative vs anti-diarrheal if he develops change in his bowel habits.  -I previously encouraged him to increase water intake if he develops diarrhea and call clinic if not controlled by imodium -Constipation resolved, likely due to chemotherapy. Bowel habits overall regular.  7. Rash on face and neck, secondary to panitumumab  -Secondary to Vectibix -I previously prescribed doxycycline to help control his rash.  -I previously recommended he uses a hat and sunscreen when out in the sun.  -Rash improved with oral doxycycline and topical treatments although Doxycycline caused GI upset. Will monitor closely.  -Rash mild but spread to b/l arms, continue clindamycin and hydrocortisone. If rash worsens with white-heads I suggest he restart doxycycline.     8. Weight loss, Low appetite -Secondary to chemo and associated with loss of appetite  -He previously agreed to increase his calorie intake -He has a dietitian friend, does not feel needing to meet our dietitian  -Continue nutritional supplements   9. Hypokalemia, hypomagnesium, secondary to panitumumab  -He was previously prescribed Oral potassium 44mq on 02/14/18, he will continue twice daily  -He was previously prescribed mag-ox 1 tab BID replacement on 02/27/18 -K at 3.2 and Mg at 1.4 today (03/14/18), I encouraged him to increase  oral K to TID for 1 week and then back to BID and start oral Mag BID.   10. Skin/nail toxicity, secondary to chemotherapy likely 5FU -NP Lacie recommended warm soaks for his hands/nails and topical hydrocortisone for mild hand-foot peeling after treatment   11. Oral  Thrush -secondary to chemo and steroids -resolved now  -He has nystatin mouthwash, I recommend he continue daily dose for prophylaxis.     Plan: -Due to decreased tolerance I will not proceed with cycle 4 today and postpone for one week with slight 5-fu dose reduction.  -Please cancel his future scheduled appointments  -CT CAP with contrast in 4-5 weeks  -Lab, flush and chemo FOLFIRINOX and vec avastin in 1, 3, 5 and 7 weeks  -F/u with me or Lacie in 2, 3, 5 and 7 weeks     All questions were answered. The patient knows to call the clinic with any problems, questions or concerns.  I spent 25 minutes counseling the patient face to face. The total time spent in the appointment was 30 minutes and more than 50% was on counseling.  Oneal Deputy, am acting as scribe for Truitt Merle, MD.   I have reviewed the above documentation for accuracy and completeness, and I agree with the above.     Truitt Merle, MD 03/14/2018  11:42 AM

## 2018-03-18 ENCOUNTER — Telehealth: Payer: Self-pay

## 2018-03-18 ENCOUNTER — Ambulatory Visit: Payer: BLUE CROSS/BLUE SHIELD | Admitting: Physical Therapy

## 2018-03-18 DIAGNOSIS — R262 Difficulty in walking, not elsewhere classified: Secondary | ICD-10-CM | POA: Diagnosis not present

## 2018-03-18 DIAGNOSIS — R252 Cramp and spasm: Secondary | ICD-10-CM

## 2018-03-18 DIAGNOSIS — M6281 Muscle weakness (generalized): Secondary | ICD-10-CM

## 2018-03-18 NOTE — Telephone Encounter (Signed)
Left voice message per Dr. Burr Medico notifying patient that ferritin level is high, do not take an iron supplement, continue magnesium and Vitamin K.  Encouraged patient to call back if he has questions.

## 2018-03-18 NOTE — Therapy (Signed)
[facility] [address] [place], [place], [address] Phone: [phone]   Fax:  [phone]  Physical Therapy Treatment  Patient Details  Name: David Clarke MRN: [id number] Date of Birth: Aug 27, 1956 Referring Provider: Truitt Merle, MD   Encounter Date: 03/18/2018  PT End of Session - 03/18/18 1246    Visit Number  4    Date for PT Re-Evaluation  05/21/18    PT Start Time  1110    PT Stop Time  1150    PT Time Calculation (min)  40 min    Activity Tolerance  Patient limited by fatigue;Patient tolerated treatment well    Behavior During Therapy  [facility] for tasks assessed/performed       Past Medical History:  Diagnosis Date  . Allergy   . Cancer ([place]) 10/09/2016   rectal adenocarcinoma  . Inguinal hernia     Past Surgical History:  Procedure Laterality Date  . COLONOSCOPY W/ BIOPSIES Bilateral 10/09/2016   rectal adenocarcinoma  . IR FLUORO GUIDE PORT INSERTION RIGHT  11/13/2017  . IR US GUIDE VASC ACCESS RIGHT  11/13/2017  . MOUTH SURGERY     age 62, to remove extra "eye" teeth  . wisdoom teeth extraction    . XI ROBOT ABDOMINAL PERINEAL RESECTION N/A 02/21/2017   Procedure: XI ROBOT ABDOMINOPERINEAL RESECTION WITH COLOSTOMY;  Surgeon: Michael Boston, MD;  Location: [facility];  Service: General;  Laterality: N/A;    There were no vitals filed for this visit.  Subjective Assessment - 03/18/18 1252    Subjective  Pt states had a hectic morning getting his ride here.  Pt reports feeling a little better and litting less numbness.    Currently in Pain?  No/denies                       [facility] Adult PT Treatment/Exercise - 03/18/18 0001      Knee/Hip Exercises: Standing   Heel Raises  20 reps heel/toe raise on foam mat    Forward Step Up  Right;20 reps;Step Height: 4"    Functional Squat  15 reps limited by fatigue    SLS  marching on foam; NBOS with head turns on foam; tandem standing with head turns  even surface      Knee/Hip Exercises: Seated   Hamstring Curl  Strengthening;Right;20 reps;Limitations    Hamstring Limitations  red band      Knee/Hip Exercises: Supine   Short Arc Quad Sets  Strengthening;Right;20 reps;Limitations    Short Arc Quad Sets Limitations  1 lb      Manual Therapy   Manual Therapy  Soft tissue mobilization    Soft tissue mobilization  glutes on right side      Ankle Exercises: Supine   T-Band  DF, inv, ev - red band - 15 x each               PT Short Term Goals - 03/18/18 1250      PT SHORT TERM GOAL #1   Title  independent with flexibility exercises to improve elongation of the hip and thigh muscles    Baseline  getting cramping in glutes    Status  On-going      PT SHORT TERM GOAL #2   Title  pt will be able to perform single leg stand on Rt LE for at least 4 seconds without UE support    Status  On-going  PT SHORT TERM GOAL #3   Title  pt will perform 6 min walk test and ambulate 1350 feet or more due to improved endurance    Status  On-going        PT Long Term Goals - 03/03/18 1106      PT LONG TERM GOAL #1   Title  independent with HEP and understand how to progress himself    Time  12    Period  Weeks    Status  New    Target Date  05/21/18      PT LONG TERM GOAL #2   Title  6 min walk test 1600 feet or more due to improved strength and endurance    Time  12    Period  Weeks    Status  New    Target Date  05/21/18      PT LONG TERM GOAL #3   Title  ability to be up walking and standing for 45 minutes or more due to improved endurance    Time  12    Period  Weeks    Status  New    Target Date  05/21/18      PT LONG TERM GOAL #4   Title  ability to lift 25# with correct bodymechanics due to LE strength >/= 4+/5    Time  12    Period  Weeks    Status  New    Target Date  05/21/18      PT LONG TERM GOAL #5   Title  pt will report 25% reduction in nerve related symptoms such as numbness in Rt thigh    Time   12    Period  Weeks    Status  New    Target Date  05/21/18            Plan - 03/18/18 1247    Clinical Impression Statement  Able to do more standing and single leg standing with increased difficulty of balance.   Pt continues to be limited mostly by fatigue.  Quad and hip flexion strength are deficient.  Pt continues to benefit from skilled PT to address strength, endurance, and balance.      PT Treatment/Interventions  ADLs/Self Care Home Management;Biofeedback;Pensions consultant;Therapeutic activities;Therapeutic exercise;Neuromuscular re-education;Balance training;Patient/family education;Manual techniques;Dry needling;Manual lymph drainage;Passive range of motion;Taping;Energy conservation    PT Next Visit Plan  progress endurance, LE ROM and strength, lumbar, glute, and hamstring stretches, balance    PT Home Exercise Plan  Access Code: [email or web address]     Recommended Other Services  eval 02/26/18 has been cosigned    Consulted and Agree with Plan of Care  Patient       Patient will benefit from skilled therapeutic intervention in order to improve the following deficits and impairments:  Decreased endurance, Decreased range of motion, Decreased strength, Difficulty walking  Visit Diagnosis: Difficulty in walking, not elsewhere classified  Muscle weakness (generalized)  Cramp and spasm     Problem List Patient Active Problem List   Diagnosis Date Noted  . Port-A-Cath in place 02/27/2018  . Goals of care, counseling/discussion 10/30/2017  . 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 Fever   . Dehydration   . Diarrhea   . 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 Hypokalemia 12/03/2016  . Leukopenia 12/03/2016  . Prolonged Q-T interval on ECG 12/03/2016  . Oral thrush 12/03/2016  . Hemochromatosis 11/19/2016  . Family history of hemochromatosis 10/25/2016  .  Iron deficiency anemia due to chronic blood  loss 10/25/2016  . Rectal adenocarcinoma ([place]) 10/05/2016    Zannie Cove, PT 03/18/2018, 12:52 PM  [facility] [address], [place], [place], [address] Phone: [phone]   Fax:  [phone]  Name: ARMONDO CECH MRN: [id number] Date of Birth: September 22, 1955

## 2018-03-18 NOTE — Telephone Encounter (Signed)
-----   Message from Truitt Merle, MD sent at 03/18/2018  8:13 AM EDT ----- Please let pt know his lab results, make sure he is not taking iron supplement, and he is on Mag and K supplement, thanks   Truitt Merle  03/18/2018

## 2018-03-20 ENCOUNTER — Ambulatory Visit: Payer: BLUE CROSS/BLUE SHIELD | Admitting: Physical Therapy

## 2018-03-20 DIAGNOSIS — R252 Cramp and spasm: Secondary | ICD-10-CM

## 2018-03-20 DIAGNOSIS — R262 Difficulty in walking, not elsewhere classified: Secondary | ICD-10-CM

## 2018-03-20 DIAGNOSIS — M6281 Muscle weakness (generalized): Secondary | ICD-10-CM

## 2018-03-20 NOTE — Therapy (Signed)
[facility] [address] [place], [place], [address] Phone: [phone]   Fax:  [phone]  Physical Therapy Treatment  Patient Details  Name: David Clarke MRN: [id number] Date of Birth: 25-Jul-1956 Referring Provider: Truitt Merle, MD   Encounter Date: 03/20/2018  PT End of Session - 03/20/18 1253    Visit Number  5    Date for PT Re-Evaluation  05/21/18    PT Start Time  1201    PT Stop Time  1230 late    PT Time Calculation (min)  29 min    Activity Tolerance  Patient limited by fatigue;Patient tolerated treatment well    Behavior During Therapy  [facility] for tasks assessed/performed       Past Medical History:  Diagnosis Date  . Allergy   . Cancer ([place]) 10/09/2016   rectal adenocarcinoma  . Inguinal hernia     Past Surgical History:  Procedure Laterality Date  . COLONOSCOPY W/ BIOPSIES Bilateral 10/09/2016   rectal adenocarcinoma  . IR FLUORO GUIDE PORT INSERTION RIGHT  11/13/2017  . IR US GUIDE VASC ACCESS RIGHT  11/13/2017  . MOUTH SURGERY     age 42, to remove extra "eye" teeth  . wisdoom teeth extraction    . XI ROBOT ABDOMINAL PERINEAL RESECTION N/A 02/21/2017   Procedure: XI ROBOT ABDOMINOPERINEAL RESECTION WITH COLOSTOMY;  Surgeon: Michael Boston, MD;  Location: [facility];  Service: General;  Laterality: N/A;    There were no vitals filed for this visit.  Subjective Assessment - 03/20/18 1204    Subjective  I was a little sore from doing more exercises.  I walked up and down the steps reciprocally and I am trying to do more in general around the home    Patient Stated Goals  build up strength in right leg    Currently in Pain?  No/denies         [facility] PT Assessment - 03/20/18 0001      6 minute walk test results    Aerobic Endurance Distance Walked  1355                   [facility] Adult PT Treatment/Exercise - 03/20/18 0001      Lumbar Exercises: Supine   Large Ball Abdominal Isometric   20 reps ball roll out and trunk rotation    Other Supine Lumbar Exercises  upper extremity flexion with blue weighted ball      Knee/Hip Exercises: Aerobic   Nustep  L1 x 3 min PT present to assess status      Knee/Hip Exercises: Seated   Clamshell with TheraBand  Red 50x      Knee/Hip Exercises: Supine   Other Supine Knee/Hip Exercises  upper extremity flexion red band with core engaged 20x; horizontal abduciton, external rotation - 30x               PT Short Term Goals - 03/18/18 1250      PT SHORT TERM GOAL #1   Title  independent with flexibility exercises to improve elongation of the hip and thigh muscles    Baseline  getting cramping in glutes    Status  On-going      PT SHORT TERM GOAL #2   Title  pt will be able to perform single leg stand on Rt LE for at least 4 seconds without UE support    Status  On-going      PT SHORT TERM GOAL #  3   Title  pt will perform 6 min walk test and ambulate 1350 feet or more due to improved endurance    Status  On-going        PT Long Term Goals - 03/20/18 1247      PT LONG TERM GOAL #2   Title  6 min walk test 1600 feet or more due to improved strength and endurance    Baseline  1355    Status  On-going            Plan - 03/20/18 1241    Clinical Impression Statement  Pt was able to increase distance walked during 6 min walk test today.  He is able to walk up and down stair reciprocally.  Pt will have infusion tomorrow and will try to continue as much as possible with HEP.  Pt continues to benefit from skilled PT continue with POC.    PT Treatment/Interventions  ADLs/Self Care Home Management;Biofeedback;Pensions consultant;Therapeutic activities;Therapeutic exercise;Neuromuscular re-education;Balance training;Patient/family education;Manual techniques;Dry needling;Manual lymph drainage;Passive range of motion;Taping;Energy conservation    PT Next Visit Plan  progress endurance,  LE ROM and strength, lumbar, glute, and hamstring stretches, balance    PT Home Exercise Plan  Access Code: [email or web address]     Consulted and Agree with Plan of Care  Patient       Patient will benefit from skilled therapeutic intervention in order to improve the following deficits and impairments:  Decreased endurance, Decreased range of motion, Decreased strength, Difficulty walking  Visit Diagnosis: No diagnosis found.     Problem List Patient Active Problem List   Diagnosis Date Noted  . Port-A-Cath in place 02/27/2018  . Goals of care, counseling/discussion 10/30/2017  . 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 Fever   . Dehydration   . Diarrhea   . 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 Hypokalemia 12/03/2016  . Leukopenia 12/03/2016  . Prolonged Q-T interval on ECG 12/03/2016  . Oral thrush 12/03/2016  . Hemochromatosis 11/19/2016  . Family history of hemochromatosis 10/25/2016  . Iron deficiency anemia due to chronic blood loss 10/25/2016  . Rectal adenocarcinoma ([place]) 10/05/2016    Wayne Both 03/20/2018, 12:54 PM  [facility] [address] [place], [place], [address] Phone: [phone]   Fax:  [phone]  Name: David Clarke MRN: [id number] Date of Birth: 01/25/1956

## 2018-03-21 ENCOUNTER — Other Ambulatory Visit: Payer: Self-pay | Admitting: Nurse Practitioner

## 2018-03-21 ENCOUNTER — Inpatient Hospital Stay: Payer: BLUE CROSS/BLUE SHIELD

## 2018-03-21 ENCOUNTER — Other Ambulatory Visit: Payer: Self-pay | Admitting: Oncology

## 2018-03-21 ENCOUNTER — Ambulatory Visit: Payer: BLUE CROSS/BLUE SHIELD | Admitting: Nutrition

## 2018-03-21 VITALS — BP 121/88 | HR 74 | Temp 97.9°F | Resp 18

## 2018-03-21 DIAGNOSIS — C20 Malignant neoplasm of rectum: Secondary | ICD-10-CM

## 2018-03-21 DIAGNOSIS — Z95828 Presence of other vascular implants and grafts: Secondary | ICD-10-CM

## 2018-03-21 DIAGNOSIS — D500 Iron deficiency anemia secondary to blood loss (chronic): Secondary | ICD-10-CM

## 2018-03-21 LAB — COMPREHENSIVE METABOLIC PANEL
ALBUMIN: 3 g/dL — AB (ref 3.5–5.0)
ALK PHOS: 425 U/L — AB (ref 38–126)
ALT: 14 U/L (ref 0–44)
AST: 43 U/L — AB (ref 15–41)
Anion gap: 9 (ref 5–15)
BILIRUBIN TOTAL: 0.3 mg/dL (ref 0.3–1.2)
BUN: 7 mg/dL — AB (ref 8–23)
CALCIUM: 8.9 mg/dL (ref 8.9–10.3)
CO2: 23 mmol/L (ref 22–32)
CREATININE: 0.6 mg/dL — AB (ref 0.61–1.24)
Chloride: 105 mmol/L (ref 98–111)
GFR calc Af Amer: >60 mL/min (ref >60)
GFR calc non Af Amer: >60 mL/min (ref >60)
GLUCOSE: 108 mg/dL — AB (ref 70–99)
POTASSIUM: 3.6 mmol/L (ref 3.5–5.1)
Sodium: 137 mmol/L (ref 135–145)
Total Protein: 6.9 g/dL (ref 6.5–8.1)

## 2018-03-21 LAB — CBC WITH DIFFERENTIAL/PLATELET
BASOS ABS: 0.1 10*3/uL (ref 0.0–0.1)
Basophils Relative: 0 %
Eosinophils Absolute: 0 10*3/uL (ref 0.0–0.5)
Eosinophils Relative: 0 %
HCT: 30.9 % — ABNORMAL LOW (ref 38.4–49.9)
Hemoglobin: 10.3 g/dL — ABNORMAL LOW (ref 13.0–17.1)
Lymphocytes Relative: 3 %
Lymphs Abs: 0.5 10*3/uL — ABNORMAL LOW (ref 0.9–3.3)
MCH: 33.2 pg (ref 27.2–33.4)
MCHC: 33.4 g/dL (ref 32.0–36.0)
MCV: 99.5 fL — ABNORMAL HIGH (ref 79.3–98.0)
Monocytes Absolute: 1.5 10*3/uL — ABNORMAL HIGH (ref 0.1–0.9)
Monocytes Relative: 10 %
Neutro Abs: 13.1 10*3/uL — ABNORMAL HIGH (ref 1.5–6.5)
Neutrophils Relative %: 87 %
Platelets: 339 10*3/uL (ref 140–400)
RBC: 3.11 MIL/uL — ABNORMAL LOW (ref 4.20–5.82)
RDW: 18.1 % — ABNORMAL HIGH (ref 11.0–14.6)
WBC: 15.1 10*3/uL — AB (ref 4.0–10.3)

## 2018-03-21 LAB — MAGNESIUM: Magnesium: 1.4 mg/dL — CL (ref 1.7–2.4)

## 2018-03-21 MED ORDER — LEUCOVORIN CALCIUM INJECTION 350 MG
400.0000 mg/m2 | Freq: Once | INTRAVENOUS | Status: AC
  Administered 2018-03-21: 680 mg via INTRAVENOUS
  Filled 2018-03-21: qty 34

## 2018-03-21 MED ORDER — SODIUM CHLORIDE 0.9 % IV SOLN
2000.0000 mg/m2 | INTRAVENOUS | Status: DC
  Administered 2018-03-21: 3400 mg via INTRAVENOUS
  Filled 2018-03-21: qty 68

## 2018-03-21 MED ORDER — PALONOSETRON HCL INJECTION 0.25 MG/5ML
0.2500 mg | Freq: Once | INTRAVENOUS | Status: AC
  Administered 2018-03-21: 0.25 mg via INTRAVENOUS

## 2018-03-21 MED ORDER — HEPARIN SOD (PORK) LOCK FLUSH 100 UNIT/ML IV SOLN
500.0000 [IU] | Freq: Once | INTRAVENOUS | Status: DC | PRN
  Filled 2018-03-21: qty 5

## 2018-03-21 MED ORDER — SODIUM CHLORIDE 0.9 % IV SOLN
Freq: Once | INTRAVENOUS | Status: AC
  Administered 2018-03-21: 12:00:00 via INTRAVENOUS
  Filled 2018-03-21: qty 5

## 2018-03-21 MED ORDER — IRINOTECAN HCL CHEMO INJECTION 100 MG/5ML
130.0000 mg/m2 | Freq: Once | INTRAVENOUS | Status: AC
  Administered 2018-03-21: 220 mg via INTRAVENOUS
  Filled 2018-03-21: qty 11

## 2018-03-21 MED ORDER — OXALIPLATIN CHEMO INJECTION 100 MG/20ML
60.0000 mg/m2 | Freq: Once | INTRAVENOUS | Status: AC
  Administered 2018-03-21: 100 mg via INTRAVENOUS
  Filled 2018-03-21: qty 20

## 2018-03-21 MED ORDER — MAGNESIUM OXIDE 400 (241.3 MG) MG PO TABS: 400.0000 mg | ORAL_TABLET | Freq: Two times a day (BID) | ORAL | 1 refills | Status: DC

## 2018-03-21 MED ORDER — SODIUM CHLORIDE 0.9% FLUSH
10.0000 mL | Freq: Once | INTRAVENOUS | Status: AC
  Administered 2018-03-21: 10 mL
  Filled 2018-03-21: qty 10

## 2018-03-21 MED ORDER — SODIUM CHLORIDE 0.9 % IV SOLN
Freq: Once | INTRAVENOUS | Status: AC
  Filled 2018-03-21: qty 250

## 2018-03-21 MED ORDER — SODIUM CHLORIDE 0.9 % IV SOLN
Freq: Once | INTRAVENOUS | Status: AC
  Administered 2018-03-21: 12:00:00 via INTRAVENOUS
  Filled 2018-03-21: qty 250

## 2018-03-21 MED ORDER — SODIUM CHLORIDE 0.9% FLUSH
10.0000 mL | INTRAVENOUS | Status: DC | PRN
  Filled 2018-03-21: qty 10

## 2018-03-21 MED ORDER — PALONOSETRON HCL INJECTION 0.25 MG/5ML
INTRAVENOUS | Status: AC
  Filled 2018-03-21: qty 5

## 2018-03-21 MED ORDER — SODIUM CHLORIDE 0.9 % IV SOLN
6.0000 mg/kg | Freq: Once | INTRAVENOUS | Status: AC
  Administered 2018-03-21: 340 mg via INTRAVENOUS
  Filled 2018-03-21: qty 17

## 2018-03-21 NOTE — Progress Notes (Signed)
Pt's 5-FU pump will be connected about 18:00 today, so it would be time to D/C on Saturday about 16:00. Per Dr. Benay Spice (since Dr. Burr Medico is off) OK to D/C pump on Saturday at 13:00/14:00 when the pt comes in rather than increase the rate or bolus the pt. Plan explained to pt who verbalized understanding and agreement

## 2018-03-21 NOTE — Progress Notes (Signed)
Nutrition follow-up completed with patient while receiving chemotherapy for rectal cancer. Weight has decreased and was documented as 122.5 pounds on July 18. Patient denies nutrition impact symptoms. He does have thrush which affects his taste but he is still trying to eat. He is trying to choose a variety of healthy foods.  He really has no questions or concerns. I have encouraged patient to contact me as needed.  **Disclaimer: This note was dictated with voice recognition software. Similar sounding words can inadvertently be transcribed and this note may contain transcription errors which may not have been corrected upon publication of note.**

## 2018-03-21 NOTE — Progress Notes (Signed)
Okay to treat with Mag of 1.4 per Dr. Learta Codding. Seth Bake

## 2018-03-21 NOTE — Patient Instructions (Signed)
[facility] Discharge Instructions for Patients Receiving Chemotherapy  Today you received the following chemotherapy agents: Vectibix, Irinotecan, leucovorin, Oxaliplatin, 5FU  To help prevent nausea and vomiting after your treatment, we encourage you to take your nausea medication as directed.   If you develop nausea and vomiting that is not controlled by your nausea medication, call the clinic.   BELOW ARE SYMPTOMS THAT SHOULD BE REPORTED IMMEDIATELY:  *FEVER GREATER THAN 100.5 F  *CHILLS WITH OR WITHOUT FEVER  NAUSEA AND VOMITING THAT IS NOT CONTROLLED WITH YOUR NAUSEA MEDICATION  *UNUSUAL SHORTNESS OF BREATH  *UNUSUAL BRUISING OR BLEEDING  TENDERNESS IN MOUTH AND THROAT WITH OR WITHOUT PRESENCE OF ULCERS  *URINARY PROBLEMS  *BOWEL PROBLEMS  UNUSUAL RASH Items with * indicate a potential emergency and should be followed up as soon as possible.  Feel free to call the clinic should you have any questions or concerns. The clinic phone number is [phone].  Please show the [facility] at check-in to the Emergency Department and triage nurse.

## 2018-03-23 ENCOUNTER — Inpatient Hospital Stay: Payer: BLUE CROSS/BLUE SHIELD

## 2018-03-23 VITALS — BP 111/81 | HR 67 | Temp 97.7°F | Resp 18

## 2018-03-23 DIAGNOSIS — C20 Malignant neoplasm of rectum: Secondary | ICD-10-CM

## 2018-03-23 MED ORDER — PEGFILGRASTIM-CBQV 6 MG/0.6ML ~~LOC~~ SOSY
6.0000 mg | PREFILLED_SYRINGE | Freq: Once | SUBCUTANEOUS | Status: AC
  Administered 2018-03-23: 6 mg via SUBCUTANEOUS

## 2018-03-23 MED ORDER — HEPARIN SOD (PORK) LOCK FLUSH 100 UNIT/ML IV SOLN
500.0000 [IU] | Freq: Once | INTRAVENOUS | Status: AC | PRN
  Administered 2018-03-23: 500 [IU]
  Filled 2018-03-23: qty 5

## 2018-03-23 MED ORDER — SODIUM CHLORIDE 0.9% FLUSH
10.0000 mL | INTRAVENOUS | Status: DC | PRN
Start: 2018-03-23 — End: 2018-03-23
  Administered 2018-03-23: 10 mL
  Filled 2018-03-23: qty 10

## 2018-03-23 MED ORDER — PEGFILGRASTIM-CBQV 6 MG/0.6ML ~~LOC~~ SOSY
PREFILLED_SYRINGE | SUBCUTANEOUS | Status: AC
  Filled 2018-03-23: qty 0.6

## 2018-03-23 NOTE — Patient Instructions (Signed)
Pegfilgrastim injection What is this medicine? PEGFILGRASTIM (PEG fil gra stim) is a long-acting granulocyte colony-stimulating factor that stimulates the growth of neutrophils, a type of white blood cell important in the body's fight against infection. It is used to reduce the incidence of fever and infection in patients with certain types of cancer who are receiving chemotherapy that affects the bone marrow, and to increase survival after being exposed to high doses of radiation. This medicine may be used for other purposes; ask your health care provider or pharmacist if you have questions. COMMON BRAND NAME(S): Neulasta What should I tell my health care provider before I take this medicine? They need to know if you have any of these conditions: -kidney disease -latex allergy -ongoing radiation therapy -sickle cell disease -skin reactions to acrylic adhesives (On-Body Injector only) -an unusual or allergic reaction to pegfilgrastim, filgrastim, other medicines, foods, dyes, or preservatives -pregnant or trying to get pregnant -breast-feeding How should I use this medicine? This medicine is for injection under the skin. If you get this medicine at home, you will be taught how to prepare and give the pre-filled syringe or how to use the On-body Injector. Refer to the patient Instructions for Use for detailed instructions. Use exactly as directed. Tell your healthcare provider immediately if you suspect that the On-body Injector may not have performed as intended or if you suspect the use of the On-body Injector resulted in a missed or partial dose. It is important that you put your used needles and syringes in a special sharps container. Do not put them in a trash can. If you do not have a sharps container, call your pharmacist or healthcare provider to get one. Talk to your pediatrician regarding the use of this medicine in children. While this drug may be prescribed for selected conditions,  precautions do apply. Overdosage: If you think you have taken too much of this medicine contact a poison control center or emergency room at once. NOTE: This medicine is only for you. Do not share this medicine with others. What if I miss a dose? It is important not to miss your dose. Call your doctor or health care professional if you miss your dose. If you miss a dose due to an On-body Injector failure or leakage, a new dose should be administered as soon as possible using a single prefilled syringe for manual use. What may interact with this medicine? Interactions have not been studied. Give your health care provider a list of all the medicines, herbs, non-prescription drugs, or dietary supplements you use. Also tell them if you smoke, drink alcohol, or use illegal drugs. Some items may interact with your medicine. This list may not describe all possible interactions. Give your health care provider a list of all the medicines, herbs, non-prescription drugs, or dietary supplements you use. Also tell them if you smoke, drink alcohol, or use illegal drugs. Some items may interact with your medicine. What should I watch for while using this medicine? You may need blood work done while you are taking this medicine. If you are going to need a MRI, CT scan, or other procedure, tell your doctor that you are using this medicine (On-Body Injector only). What side effects may I notice from receiving this medicine? Side effects that you should report to your doctor or health care professional as soon as possible: -allergic reactions like skin rash, itching or hives, swelling of the face, lips, or tongue -dizziness -fever -pain, redness, or irritation at site   where injected -pinpoint red spots on the skin -red or dark-brown urine -shortness of breath or breathing problems -stomach or side pain, or pain at the shoulder -swelling -tiredness -trouble passing urine or change in the amount of urine Side  effects that usually do not require medical attention (report to your doctor or health care professional if they continue or are bothersome): -bone pain -muscle pain This list may not describe all possible side effects. Call your doctor for medical advice about side effects. You may report side effects to FDA at 1-800-FDA-1088. Where should I keep my medicine? Keep out of the reach of children. Store pre-filled syringes in a refrigerator between 2 and 8 degrees C (36 and 46 degrees F). Do not freeze. Keep in carton to protect from light. Throw away this medicine if it is left out of the refrigerator for more than 48 hours. Throw away any unused medicine after the expiration date. NOTE: This sheet is a summary. It may not cover all possible information. If you have questions about this medicine, talk to your doctor, pharmacist, or health care provider.  2018 Elsevier/Gold Standard (2016-08-10 12:58:03)  

## 2018-03-25 ENCOUNTER — Encounter: Payer: Self-pay | Admitting: Physical Therapy

## 2018-03-25 ENCOUNTER — Ambulatory Visit: Payer: BLUE CROSS/BLUE SHIELD | Admitting: Physical Therapy

## 2018-03-25 DIAGNOSIS — M6281 Muscle weakness (generalized): Secondary | ICD-10-CM

## 2018-03-25 DIAGNOSIS — R252 Cramp and spasm: Secondary | ICD-10-CM

## 2018-03-25 DIAGNOSIS — R262 Difficulty in walking, not elsewhere classified: Secondary | ICD-10-CM | POA: Diagnosis not present

## 2018-03-25 NOTE — Therapy (Signed)
[facility] [address], [place], [place], [address] Phone: [phone]   Fax:  [phone]  Physical Therapy Treatment  Patient Details  Name: David Clarke MRN: [id number] Date of Birth: [birth year] Referring Provider: Truitt Merle, MD   Encounter Date: 03/25/2018  PT End of Session - 03/25/18 1136    Visit Number  6    Date for PT Re-Evaluation  05/21/18    PT Start Time  1107 late    PT Stop Time  1145    PT Time Calculation (min)  38 min    Activity Tolerance  Patient limited by fatigue;Patient tolerated treatment well    Behavior During Therapy  [facility] for tasks assessed/performed       Past Medical History:  Diagnosis Date  . Allergy   . Cancer ([place]) 10/09/2016   rectal adenocarcinoma  . Inguinal hernia     Past Surgical History:  Procedure Laterality Date  . COLONOSCOPY W/ BIOPSIES Bilateral 10/09/2016   rectal adenocarcinoma  . IR FLUORO GUIDE PORT INSERTION RIGHT  11/13/2017  . IR US GUIDE VASC ACCESS RIGHT  11/13/2017  . MOUTH SURGERY     age 55, to remove extra "eye" teeth  . [place] teeth extraction    . XI ROBOT ABDOMINAL PERINEAL RESECTION N/A 02/21/2017   Procedure: XI ROBOT ABDOMINOPERINEAL RESECTION WITH COLOSTOMY;  Surgeon: Michael Boston, MD;  Location: [facility];  Service: General;  Laterality: N/A;    There were no vitals filed for this visit.  Subjective Assessment - 03/25/18 1154    Subjective  I am extremely fatigued and not sure how much I'll be able to do.  I am feeling more sensation in my leg.    Patient Stated Goals  build up strength in right leg    Currently in Pain?  No/denies                       [facility] Adult PT Treatment/Exercise - 03/25/18 0001      Lumbar Exercises: Supine   Other Supine Lumbar Exercises  UE chest press with cane add 3lb then flex - 2 x 10       Knee/Hip Exercises: Aerobic   Recumbent Bike  L1 x 3 min PT present for status update      Knee/Hip Exercises: Supine   Heel Slides  Strengthening;Right    Hip Adduction Isometric  Strengthening;Right;20 reps abduction and adduction    Bridges with [employer]  Strengthening;Both;2 sets;10 reps    Other Supine Knee/Hip Exercises  clam with red band - 40x      Knee/Hip Exercises: Prone   Straight Leg Raises  Strengthening;Right;Left 8 reps      Shoulder Exercises: Standing   Horizontal ABduction  Strengthening;Both;20 reps;Theraband sitting               PT Short Term Goals - 03/25/18 1138      PT SHORT TERM GOAL #1   Title  independent with flexibility exercises to improve elongation of the hip and thigh muscles    Time  4    Period  Weeks    Status  Achieved      PT SHORT TERM GOAL #2   Title  pt will be able to perform single leg stand on Rt LE for at least 4 seconds without UE support    Status  On-going      PT SHORT TERM GOAL #3   Title  pt will perform 6 min walk test and ambulate 1350 feet or more due to improved endurance    Baseline  1355    Status  Achieved        PT Long Term Goals - 03/20/18 1247      PT LONG TERM GOAL #2   Title  6 min walk test 1600 feet or more due to improved strength and endurance    Baseline  1355    Status  On-going            Plan - 03/25/18 1149    Clinical Impression Statement  Pt was more fatigued today due to recent infusion of chemo last week. Pt was able to tolerate a full treatment and demonstrates improved trunk control during heel slides and ball roll outs.  He reports he is able to feel a little more sensation in his leg.  Pt will benefit from skilled PT to continue working on strength and endurance for return to maximum funciton.    PT Treatment/Interventions  ADLs/Self Care Home Management;Biofeedback;Pensions consultant;Therapeutic activities;Therapeutic exercise;Neuromuscular re-education;Balance training;Patient/family education;Manual techniques;Dry  needling;Manual lymph drainage;Passive range of motion;Taping;Energy conservation    PT Next Visit Plan  single leg stand for goals, progress endurance, LE ROM and strength, core strength, balance, lumbar, glute, and hamstring stretches    PT Home Exercise Plan  Access Code: [email or web address]     Consulted and Agree with Plan of Care  Patient       Patient will benefit from skilled therapeutic intervention in order to improve the following deficits and impairments:  Decreased endurance, Decreased range of motion, Decreased strength, Difficulty walking  Visit Diagnosis: Difficulty in walking, not elsewhere classified  Muscle weakness (generalized)  Cramp and spasm     Problem List Patient Active Problem List   Diagnosis Date Noted  . Port-A-Cath in place 02/27/2018  . Goals of care, counseling/discussion 10/30/2017  . 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 Fever   . Dehydration   . Diarrhea   . 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 Hypokalemia 12/03/2016  . Leukopenia 12/03/2016  . Prolonged Q-T interval on ECG 12/03/2016  . Oral thrush 12/03/2016  . Hemochromatosis 11/19/2016  . Family history of hemochromatosis 10/25/2016  . Iron deficiency anemia due to chronic blood loss 10/25/2016  . Rectal adenocarcinoma ([place]) 10/05/2016    Zannie Cove, PT 03/25/2018, 11:54 AM  [facility] [address], [place], [place], [address] Phone: [phone]   Fax:  [phone]  Name: David Clarke MRN: [id number] Date of Birth: 09/12/1955

## 2018-03-27 ENCOUNTER — Inpatient Hospital Stay: Payer: BLUE CROSS/BLUE SHIELD

## 2018-03-27 ENCOUNTER — Telehealth: Payer: Self-pay | Admitting: Hematology

## 2018-03-27 ENCOUNTER — Inpatient Hospital Stay (HOSPITAL_BASED_OUTPATIENT_CLINIC_OR_DEPARTMENT_OTHER): Payer: BLUE CROSS/BLUE SHIELD | Admitting: Hematology

## 2018-03-27 ENCOUNTER — Encounter: Payer: Self-pay | Admitting: Hematology

## 2018-03-27 ENCOUNTER — Ambulatory Visit: Payer: BLUE CROSS/BLUE SHIELD | Admitting: Physical Therapy

## 2018-03-27 VITALS — BP 113/82 | HR 83 | Temp 97.5°F | Resp 18 | Ht 72.0 in | Wt 123.9 lb

## 2018-03-27 DIAGNOSIS — C7800 Secondary malignant neoplasm of unspecified lung: Secondary | ICD-10-CM | POA: Diagnosis not present

## 2018-03-27 DIAGNOSIS — D500 Iron deficiency anemia secondary to blood loss (chronic): Secondary | ICD-10-CM

## 2018-03-27 DIAGNOSIS — R262 Difficulty in walking, not elsewhere classified: Secondary | ICD-10-CM | POA: Diagnosis not present

## 2018-03-27 DIAGNOSIS — M6281 Muscle weakness (generalized): Secondary | ICD-10-CM

## 2018-03-27 DIAGNOSIS — G620 Drug-induced polyneuropathy: Secondary | ICD-10-CM

## 2018-03-27 DIAGNOSIS — K922 Gastrointestinal hemorrhage, unspecified: Secondary | ICD-10-CM

## 2018-03-27 DIAGNOSIS — C778 Secondary and unspecified malignant neoplasm of lymph nodes of multiple regions: Secondary | ICD-10-CM | POA: Diagnosis not present

## 2018-03-27 DIAGNOSIS — R252 Cramp and spasm: Secondary | ICD-10-CM

## 2018-03-27 DIAGNOSIS — Z95828 Presence of other vascular implants and grafts: Secondary | ICD-10-CM

## 2018-03-27 DIAGNOSIS — C20 Malignant neoplasm of rectum: Secondary | ICD-10-CM

## 2018-03-27 DIAGNOSIS — C787 Secondary malignant neoplasm of liver and intrahepatic bile duct: Secondary | ICD-10-CM

## 2018-03-27 DIAGNOSIS — L270 Generalized skin eruption due to drugs and medicaments taken internally: Secondary | ICD-10-CM

## 2018-03-27 DIAGNOSIS — E876 Hypokalemia: Secondary | ICD-10-CM

## 2018-03-27 DIAGNOSIS — C7989 Secondary malignant neoplasm of other specified sites: Secondary | ICD-10-CM

## 2018-03-27 LAB — CBC WITH DIFFERENTIAL/PLATELET
BASOS PCT: 0 %
Basophils Absolute: 0 10*3/uL (ref 0.0–0.1)
Eosinophils Absolute: 0 10*3/uL (ref 0.0–0.5)
Eosinophils Relative: 0 %
HCT: 30.4 % — ABNORMAL LOW (ref 38.4–49.9)
Hemoglobin: 9.7 g/dL — ABNORMAL LOW (ref 13.0–17.1)
Lymphocytes Relative: 3 %
Lymphs Abs: 0.7 10*3/uL — ABNORMAL LOW (ref 0.9–3.3)
MCH: 32.4 pg (ref 27.2–33.4)
MCHC: 31.9 g/dL — ABNORMAL LOW (ref 32.0–36.0)
MCV: 101.7 fL — ABNORMAL HIGH (ref 79.3–98.0)
Monocytes Absolute: 0.8 10*3/uL (ref 0.1–0.9)
Monocytes Relative: 4 %
Neutro Abs: 21.1 10*3/uL — ABNORMAL HIGH (ref 1.5–6.5)
Neutrophils Relative %: 93 %
PLATELETS: 221 10*3/uL (ref 140–400)
RBC: 2.99 MIL/uL — AB (ref 4.20–5.82)
RDW: 16.7 % — AB (ref 11.0–14.6)
WBC: 22.7 10*3/uL — AB (ref 4.0–10.3)

## 2018-03-27 LAB — COMPREHENSIVE METABOLIC PANEL
ALK PHOS: 627 U/L — AB (ref 38–126)
ALT: 19 U/L (ref 0–44)
AST: 33 U/L (ref 15–41)
Albumin: 3.1 g/dL — ABNORMAL LOW (ref 3.5–5.0)
Anion gap: 10 (ref 5–15)
BILIRUBIN TOTAL: 0.4 mg/dL (ref 0.3–1.2)
BUN: 8 mg/dL (ref 8–23)
CALCIUM: 9.1 mg/dL (ref 8.9–10.3)
CO2: 25 mmol/L (ref 22–32)
Chloride: 101 mmol/L (ref 98–111)
Creatinine, Ser: 0.65 mg/dL (ref 0.61–1.24)
GFR calc Af Amer: >60 mL/min (ref >60)
GFR calc non Af Amer: >60 mL/min (ref >60)
Glucose, Bld: 116 mg/dL — ABNORMAL HIGH (ref 70–99)
Potassium: 4 mmol/L (ref 3.5–5.1)
Sodium: 136 mmol/L (ref 135–145)
TOTAL PROTEIN: 7 g/dL (ref 6.5–8.1)

## 2018-03-27 LAB — MAGNESIUM: Magnesium: 1.7 mg/dL (ref 1.7–2.4)

## 2018-03-27 MED ORDER — SODIUM CHLORIDE 0.9% FLUSH
10.0000 mL | Freq: Once | INTRAVENOUS | Status: AC
  Administered 2018-03-27: 10 mL
  Filled 2018-03-27: qty 10

## 2018-03-27 NOTE — Telephone Encounter (Signed)
No LOS 7/31

## 2018-03-27 NOTE — Therapy (Signed)
[facility] [address], [place], [place], [address] Phone: [phone]   Fax:  [phone]  Physical Therapy Treatment  Patient Details  Name: David Clarke MRN: [id number] Date of Birth: 14-Jan-1956 Referring Provider: Truitt Merle, MD   Encounter Date: 03/27/2018  PT End of Session - 03/27/18 1155    Visit Number  7    Date for PT Re-Evaluation  05/21/18    Authorization Type  [employer]    PT Start Time  1146    PT Stop Time  1225    PT Time Calculation (min)  39 min    Activity Tolerance  Patient limited by fatigue;Patient tolerated treatment well    Behavior During Therapy  [facility] for tasks assessed/performed       Past Medical History:  Diagnosis Date  . Allergy   . Cancer ([place]) 10/09/2016   rectal adenocarcinoma  . Inguinal hernia     Past Surgical History:  Procedure Laterality Date  . COLONOSCOPY W/ BIOPSIES Bilateral 10/09/2016   rectal adenocarcinoma  . IR FLUORO GUIDE PORT INSERTION RIGHT  11/13/2017  . IR US GUIDE VASC ACCESS RIGHT  11/13/2017  . MOUTH SURGERY     age 62, to remove extra "eye" teeth  . [place] teeth extraction    . XI ROBOT ABDOMINAL PERINEAL RESECTION N/A 02/21/2017   Procedure: XI ROBOT ABDOMINOPERINEAL RESECTION WITH COLOSTOMY;  Surgeon: Michael Boston, MD;  Location: [facility];  Service: General;  Laterality: N/A;    There were no vitals filed for this visit.  Subjective Assessment - 03/27/18 1211    Subjective  I went up and down the stairs and felt sore from that.  I am still tired from infusion and was at the doctors today    Patient Stated Goals  build up strength in right leg    Currently in Pain?  No/denies                       [facility] Adult PT Treatment/Exercise - 03/27/18 0001      Lumbar Exercises: Supine   Large Ball Oblique Isometric  20 reps ball overhead with TrA activation    Other Supine Lumbar Exercises  shoulder ext red band - 30x      Knee/Hip  Exercises: Supine   Quad Sets  --    Short Arc Quad Sets  Strengthening;Right;20 reps;Limitations    Short Arc Quad Sets Limitations  4 lb    Heel Slides  Strengthening;Right;Left;20 reps alt LE slides small ROM    Hip Adduction Isometric  Strengthening;Both;20 reps;Limitations    Hip Adduction Isometric Limitations  bent knee drop outs    Bridges with [employer]  Strengthening;Both;2 sets;10 reps               PT Short Term Goals - 03/25/18 1138      PT SHORT TERM GOAL #1   Title  independent with flexibility exercises to improve elongation of the hip and thigh muscles    Time  4    Period  Weeks    Status  Achieved      PT SHORT TERM GOAL #2   Title  pt will be able to perform single leg stand on Rt LE for at least 4 seconds without UE support    Status  On-going      PT SHORT TERM GOAL #3   Title  pt will perform 6 min walk test and ambulate  1350 feet or more due to improved endurance    Baseline  1355    Status  Achieved        PT Long Term Goals - 03/27/18 1212      PT LONG TERM GOAL #1   Title  independent with HEP and understand how to progress himself    Status  On-going      PT LONG TERM GOAL #3   Title  ability to be up walking and standing for 45 minutes or more due to improved endurance    Status  On-going      PT LONG TERM GOAL #4   Title  ability to lift 25# with correct bodymechanics due to LE strength >/= 4+/5      PT LONG TERM GOAL #5   Title  pt will report 25% reduction in nerve related symptoms such as numbness in Rt thigh    Status  On-going            Plan - 03/27/18 1200    Clinical Impression Statement  Pt was still fatigued today from his most recent chemo treatment.  Pt needed modification to account for fatigue. Pt has difficulty keeping Rt LE  stable due to hip flexor and adductor weakness.  Pt will benefit from skilled PT for endurance and strength due to deconditioning.     PT Treatment/Interventions  ADLs/Self Care Home  Management;Biofeedback;Pensions consultant;Therapeutic activities;Therapeutic exercise;Neuromuscular re-education;Balance training;Patient/family education;Manual techniques;Dry needling;Manual lymph drainage;Passive range of motion;Taping;Energy conservation    PT Next Visit Plan  standing as tolerance allows, endurance, LE strength    Consulted and Agree with Plan of Care  Patient       Patient will benefit from skilled therapeutic intervention in order to improve the following deficits and impairments:  Decreased endurance, Decreased range of motion, Decreased strength, Difficulty walking  Visit Diagnosis: Difficulty in walking, not elsewhere classified  Muscle weakness (generalized)  Cramp and spasm     Problem List Patient Active Problem List   Diagnosis Date Noted  . Port-A-Cath in place 02/27/2018  . Goals of care, counseling/discussion 10/30/2017  . 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 Fever   . Dehydration   . Diarrhea   . 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 Hypokalemia 12/03/2016  . Leukopenia 12/03/2016  . Prolonged Q-T interval on ECG 12/03/2016  . Oral thrush 12/03/2016  . Hemochromatosis 11/19/2016  . Family history of hemochromatosis 10/25/2016  . Iron deficiency anemia due to chronic blood loss 10/25/2016  . Rectal adenocarcinoma ([place]) 10/05/2016    Zannie Cove, PT 03/27/2018, 12:30 PM  [facility] [address], [place], [place], [address] Phone: [phone]   Fax:  [phone]  Name: David Clarke MRN: [id number] Date of Birth: 08-Aug-1956

## 2018-03-27 NOTE — Progress Notes (Signed)
[place]  Telephone:([phone]) [phone] Fax:(336) [phone]  Clinic Follow Up Note   Patient Care Team: Robyn Haber, MD as PCP - General (Family Medicine) Michael Boston, MD as Consulting Physician (General Surgery) Pyrtle, Lajuan Lines, MD as Consulting Physician (Gastroenterology) Truitt Merle, MD as Consulting Physician (Hematology and Oncology) Kyung Rudd, MD as Consulting Physician (Radiation Oncology)   Date of Service:  03/27/2018  CHIEF COMPLAINTS:  Follow up rectal adenocarcinoma  Oncology History   Cancer Staging Rectal adenocarcinoma [facility]) Staging form: Colon and Rectum, AJCC 8th Edition - Clinical stage from 10/09/2016: Stage IIIB (cT3, cN1b, cM0) - Signed by Truitt Merle, MD on 10/25/2016 - Pathologic stage from 02/21/2017: Stage IIA (ypT3, pN0, cM0) - Signed by Truitt Merle, MD on 10/18/2017       Rectal adenocarcinoma ([place])   10/03/2016 - 10/03/2016 Hospital Admission    Admit date: 10/03/16 The patient presented to the [facility] ED with rectal bleeding and difficulty with hygiene.      10/09/2016 Initial Diagnosis    Rectal adenocarcinoma       10/09/2016 Procedure    Colonoscopy showed a fungating, infiltrative and ulcerated partially obstructing large mass in the distal rectum, measuring 9 cm, prolapsing at anal canal. Scope not advanced proximal to the rectosigmoid colon.      10/09/2016 Initial Biopsy    Rectal mass biopsy showed invasive adenocarcinoma.      10/13/2016 Imaging    CT Chest Abdomen Pelvis w contrast IMPRESSION: Large irregular enhancing rectal mass consistent with known rectal cancer. There are small perirectal and small pelvic sidewall lymph nodes which are suspicious. Prominent perirectal vessels are also noted. No evidence of metastatic retroperitoneal or sigmoid mesocolon adenopathy or hepatic or pulmonary metastasis.      10/24/2016 Imaging    MRI Pelvis IMPRESSION: 1. Moderately motion degraded exam. 2. Large mass is centered at  the anus with extension into the low rectum. No complicating obstruction. 3. Mild mesorectal adenopathy, suspicious. 4. Right inguinal hernia containing nonobstructive small bowel.       11/08/2016 - 12/19/2016 Chemotherapy    Xeloda 50 mg twice daily, with concurrent radiation, held since 12/03/2016 due to colitis and hospitalization      11/08/2016 - 12/19/2016 Radiation Therapy    Neoadjuvant radiation to his rectal cancer  1) Pelvis/ 45 Gy in 25 fractions  2) Rectum boost/ 9 Gy in 5 fractions Under the care of Dr. Lisbeth Renshaw.      12/03/2016 - 12/09/2016 Hospital Admission    He presented with few days to one week history of diarrhea with 6-7 loose stools daily, along with a fever of 101.5 on the day of admission. Patient was also noted to be hypokalemic, hyponatremic. Hospitalized for further management. Stool studies were sent. C. difficile was negative. GI pathogen panel was positive for Yersinia.  XELODA was held at this time, until infection has cleared.       12/07/2016 Imaging    CT A/P W Contrast  IMPRESSION: 1. Abnormal dilatation of the proximal small bowel loops worrisome for either small bowel obstruction versus small bowel ileus. 2. 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 3. Large right inguinal hernia containing edematous loop of distal small bowel 4. No pneumatosis or bowel perforation.  No abscess. 5. Aortic atherosclerosis 6. Mild decrease in size of rectal mass. No change in right common iliac adenopathy.      02/21/2017 Surgery  XI ROBOT ABDOMINOPERINEAL RESECTION WITH COLOSTOMY by Dr. Johney Maine      02/21/2017 Pathology Results    Diagnosis 02/21/17 1. Colon, segmental resection for tumor, rectosigmoid - INVASIVE ADENOCARCINOMA, MODERATELY DIFFERENTIATED, SPANNING 3.9 CM. - TUMOR INVADES THROUGH MUSCULARIS PROPRIA. - RESECTION MARGINS ARE NEGATIVE. - EIGHT OF  EIGHT LYMPH NODES NEGATIVE FOR CARCINOMA (0/8), SEE COMMENT. - SEE ONCOLOGY TABLE. 2. Soft tissue, biopsy, left lateral pelvic floor - BENIGN SKELETAL MUSCLE AND SOFT TISSUE. - NO MALIGNANCY IDENTIFIED. 3. Colon, segmental resection, proximal sigmoid - BENIGN COLONIC MUCOSA. - NO DYSPLASIA OR MALIGNANCY. 4. Liver, biopsy, liver mass - BENIGN VASCULAR PROLIFERATION CONSISTENT WITH HEMANGIOMA. - NO MALIGNANCY IDENTIFIED.      04/04/2017 - 08/12/2017 Chemotherapy    CAPOX every 3 weeks for 6 cycles. I decreased his first cycle of oxaliplatin platinum by 15%, he tolerated well, so we increased to full dose from cycle 2. He completed 5 cycles of oxaliplatin on 07/16/17 and completed Xeloda on 08/12/17.        08/15/2017 Procedure    Colonoscopy 08/15/17 IMPRESSION - Patent end colostomy with healthy appearing mucosa in the sigmoid colon. - The entire examined colon is normal. - No specimens collected.      09/18/2017 Imaging    CT CAP W Contrast 09/18/17 IMPRESSION: Interval abdomino-peritoneal resection with left lower quadrant colostomy. Increased diffuse cystitis, likely due to radiation. New presacral soft tissue density with central low attenuation, also suspicious for post treatment changes. Recommend continued attention on follow-up imaging. New mild retroperitoneal lymphadenopathy in the aortocaval space, measuring up to 1.8 cm. This is suspicious for metastatic disease, although it is conceivable that this could be reactive in etiology due to interval surgery and radiation therapy. Recommend short-term follow-up by CT in 3 months. New indeterminate 4 mm left upper lobe pulmonary nodule, likely inflammatory in etiology with metastatic disease considered less likely. Recommend continued attention on follow-up CT. Bilateral inguinal hernias, largest on the right containing several small bowel loops.      10/17/2017 PET scan    MPRESSION: Increased hypermetabolic  lymphadenopathy in abdominal retroperitoneum, right common iliac chain, and left supraclavicular region, consistent with metastatic disease. Stable tiny bilateral pulmonary nodules show no FDG uptake but are too small to characterize by PET. Recommend continued attention on follow-up CT. 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      10/29/2017 Relapse/Recurrence    Diagnosis Lymph node, needle/core biopsy, left supraclavicular - METASTATIC ADENOCARCINOMA, CONSISTENT WITH COLORECTAL PRIMARY. - SEE COMMENT.      11/20/2017 - 01/17/2018 Chemotherapy    FOLFIRI every 2 weeks, panitumumab was added from cycle 3.  FOLFIRI was held after cycle 3 due to disease progression, he continued with 5-FU and Panitumumab until further disease progression.        01/08/2018 Imaging    CT CAP with contrast IMPRESSION: 1. Today's study demonstrates dramatic progression of disease with interval development of widespread pulmonary metastases, multiple new liver lesions and progressive lymphadenopathy noted in the pelvis, retroperitoneum and left supraclavicular nodal stations. In addition, the previously noted metastatic lesion in the right psoas muscle has markedly increased in size. Whether or not this reflects growth of the lesion and/or internal hemorrhage of the lesion is uncertain. 2. Stable postoperative thickening and small postoperative fluid collection in the presacral space, as above, following abdominal perineal resection. 3. Large right inguinal hernia containing several loops of small bowel, without evidence of bowel incarceration or obstruction at this time.  01/14/2018 - 01/24/2018 Radiation Therapy    Radiation with Dr. Lisbeth Renshaw starting 01/14/18. Plan to complete on 01/24/18      01/31/2018 -  Chemotherapy    FOLFIRINOX plus Vectibix every 2 weeks starting 01/31/18      HISTORY OF PRESENTING ILLNESS (10/25/16):  David Clarke 62 y.o. male is here  because of his recently diagnosed rectal adenocarcinoma. He was referred by his gastroenterologist title parietal. Patient presents to my clinic, accompanied by his wife and his daughter.       He has noticed a anal mass accompanied by intermittent bleeding and pain in the rectum for the past 5 months. The patient initially presented to the [facility] ED on 10/03/16 with rectal bleeding and difficulty with hygiene. He was without health insurance previously and so did not pursue evaluation or treatment until this time.  The patient was seen by Dr. Hilarie Fredrickson for a colonoscopy and biopsy on 10/09/16, revealing a large partially obstructing low rectal mass, prolapsing to anal verge. Biopsy showed adenocarcinoma. CT C/A/P on 10/13/16 revealed a large, irregular, enhancing mass consistent with known rectal cancer. There were several suspicious small perirectal and small pelvic sidewall lymph nodes. Prominent perirectal vessels were also noted. MRI of the pelvis on 10/24/16 showed a large mass centered at the anus with extension into the low rectum without obstruction. There was suspicious mild mesorectal adenopathy, and a right inguinal hernia containing nonobstructive small bowel.   The patient comes to the clinic for consultation today accompanied by his partner and daughter. He denies abdominal cramps, nausea, bowel or bladder concerns. He reports taking OTC stool softener as needed. He reports his appetite is good, but he has lost 12-15 lbs in the past few weeks. His daughter notes she has hemochromatosis and questions if this could be playing a role in the patient's anemia.  CURRENT THERAPY:  FOFIRINOX with dose reduction plus panitumumab every 2 weeks started on 01/31/18, has been dose reduced with cycle 2-4.    INTERIM HISTORY:   MIKIE MISNER is a 62 y.o. male who presents for follow up. He presents to the clinic today with his wife. He notes his last cycle went well and only had skin cracks on hands and feet.  He is using ointment and creams as before. He notes he did have fatigue but not his other symptoms as before.  He notes his weight has been fluctuating but he has been eating broth/soup like foods. He plans to take high protein shakes.  He notes his glute pain is muscular, based on visit with PT. He will see PT again today.     MEDICAL HISTORY:  Past Medical History:  Diagnosis Date  . Allergy   . Cancer ([place]) 10/09/2016   rectal adenocarcinoma  . Inguinal hernia     SURGICAL HISTORY: Past Surgical History:  Procedure Laterality Date  . COLONOSCOPY W/ BIOPSIES Bilateral 10/09/2016   rectal adenocarcinoma  . IR FLUORO GUIDE PORT INSERTION RIGHT  11/13/2017  . IR US GUIDE VASC ACCESS RIGHT  11/13/2017  . MOUTH SURGERY     age 42, to remove extra "eye" teeth  . [place] teeth extraction    . XI ROBOT ABDOMINAL PERINEAL RESECTION N/A 02/21/2017   Procedure: XI ROBOT ABDOMINOPERINEAL RESECTION WITH COLOSTOMY;  Surgeon: Michael Boston, MD;  Location: [facility];  Service: General;  Laterality: N/A;    SOCIAL HISTORY: Social History   Socioeconomic History  . Marital status: Soil scientist    Spouse name:  Not on file  . Number of children: 1  . Years of education: Not on file  . Highest education level: Not on file  Occupational History  . Occupation: Architectural technologist  Social Needs  . Financial resource strain: Not on file  . Food insecurity:    Worry: Not on file    Inability: Not on file  . Transportation needs:    Medical: Not on file    Non-medical: Not on file  Tobacco Use  . Smoking status: Former Smoker    Years: 2.00    Types: Cigarettes    Last attempt to quit: 2002    Years since quitting: 17.5  . Smokeless tobacco: Never Used  Substance and Sexual Activity  . Alcohol use: Not Currently    Alcohol/week: 6.0 oz    Types: 4 Glasses of wine, 6 Cans of beer per week    Comment: beer or a glass or wine most days of the week  . Drug use: No  . Sexual activity: Yes      Partners: Female  Lifestyle  . Physical activity:    Days per week: Not on file    Minutes per session: Not on file  . Stress: Not on file  Relationships  . Social connections:    Talks on phone: Not on file    Gets together: Not on file    Attends religious service: Not on file    Active member of club or organization: Not on file    Attends meetings of clubs or organizations: Not on file    Relationship status: Not on file  . Intimate partner violence:    Fear of current or ex partner: Not on file    Emotionally abused: Not on file    Physically abused: Not on file    Forced sexual activity: Not on file  Other Topics Concern  . Not on file  Social History Narrative  . Not on file    FAMILY HISTORY: Family History  Problem Relation Age of Onset  . Other Mother        bile duct tumor  . Stroke Mother   . Cancer Mother 67       bile duct cancer   . Prostate cancer Father 32  . Emphysema Father   . Colon cancer Neg Hx   . Esophageal cancer Neg Hx   . Pancreatic cancer Neg Hx   . Rectal cancer Neg Hx   . Stomach cancer Neg Hx     ALLERGIES:  is allergic to sulfa antibiotics.  MEDICATIONS:  Current Outpatient Medications  Medication Sig Dispense Refill  . acetaminophen (TYLENOL) 500 MG tablet Take 500 mg by mouth every 6 (six) hours as needed.    . clindamycin (CLINDAGEL) 1 % gel Apply topically 2 (two) times daily. 60 g 1  . dexamethasone (DECADRON) 4 MG tablet Take 1 tablet (4 mg total) by mouth daily. Start the day after chemo for 3-5 days. 10 tablet 2  . gabapentin (NEURONTIN) 100 MG capsule Take 1 capsule (100 mg total) by mouth at bedtime. 60 capsule 0  . magnesium oxide (MAG-OX) 400 (241.3 Mg) MG tablet Take 1 tablet (400 mg total) by mouth 2 (two) times daily. 60 tablet 1  . Multiple Vitamin (MULTIVITAMIN WITH MINERALS) TABS tablet Take 1 tablet by mouth every other day.     . nystatin (MYCOSTATIN) 100000 UNIT/ML suspension Take 5 mLs (500,000 Units total)  by mouth 4 (four) times daily. 473 mL  0  . omeprazole (PRILOSEC) 40 MG capsule Take 1 capsule (40 mg total) by mouth daily. 30 capsule 2  . ondansetron (ZOFRAN) 8 MG tablet Take 1 tablet (8 mg total) by mouth 2 (two) times daily as needed for refractory nausea / vomiting. Start on day 3 after chemotherapy. 30 tablet 1  . potassium chloride SA (K-DUR,KLOR-CON) 20 MEQ tablet Take 1 tablet (20 mEq total) by mouth 2 (two) times daily. 60 tablet 1  . prochlorperazine (COMPAZINE) 10 MG tablet Take 1 tablet (10 mg total) by mouth every 6 (six) hours as needed (NAUSEA). 30 tablet 1   Current Facility-Administered Medications  Medication Dose Route Frequency Provider Last Rate Last Dose  . 0.9 %  sodium chloride infusion  500 mL Intravenous Continuous Pyrtle, Lajuan Lines, MD      . 0.9 %  sodium chloride infusion  500 mL Intravenous Once Pyrtle, Lajuan Lines, MD        REVIEW OF SYSTEMS:  Constitutional: Denies fevers, chills or abnormal night sweats (+) appetite adequate  Eyes: Denies blurriness of vision, double vision or watery eyes Ears, nose, mouth, throat, and face: Denies mucositis or sore throat  Respiratory: Denies cough, dyspnea or wheezes Cardiovascular: Denies palpitation, chest discomfort  Gastrointestinal:  Denies nausea  (+) colostomy bag  Skin: (+) rash on face and neck (+) dry, scaly and peeling skin with mild cracks  Lymphatics: Denies new lymphadenopathy or easy bruising MSK: (+) right glute pain and right mid-thigh numbness, with limp, much improved (+) ambulates with cane, improved Neurological: (+) improved intermittent neuropathy in feet, mild and cold sensitivity Behavioral/Psych: Mood is stable, no new changes  All other systems were reviewed with the patient and are negative.   PHYSICAL EXAMINATION:   ECOG PERFORMANCE STATUS: 2  Vitals:   03/27/18 1046  BP: 113/82  Pulse: 83  Resp: 18  Temp: (!) 97.5 F (36.4 C)  SpO2: 98%   Filed Weights   03/27/18 1046  Weight: 123 lb  14.4 oz (56.2 kg)     GENERAL: alert, no distress and comfortable. (+) Cane for ambulation SKIN: skin color, texture, turgor are normal, (+) diffuse acne-like rash on his face, neck and upper chest, no diffuse skin erythema. NECK: supple, thyroid normal size, non-tender, without nodularity LYMPH: (+) 2x 1.5 cm left supraclavicular lymph node is now barely palpable, non-tender LUNGS: clear to auscultation and percussion with normal breathing effort HEART: regular rate & rhythm and no murmurs and no lower extremity edema ABDOMEN: (+) grapefruit size mass palpated in the RLQ, has improved. abdomen soft, non-tender and normal bowel sounds (+) laparoscopic incisions all healed Musculoskeletal: no cyanosis of digits and no clubbing  PSYCH: alert & oriented x 3 with fluent speech NEURO: no focal motor/sensory deficits. (+) mild weakness upon lifting right leg.   LABORATORY DATA:  I have reviewed the data as listed CBC Latest Ref Rng & Units 03/27/2018 03/21/2018 03/14/2018  WBC 4.0 - 10.3 K/uL 22.7(H) 15.1(H) 20.1(H)  Hemoglobin 13.0 - 17.1 g/dL 9.7(L) 10.3(L) 10.9(L)  Hematocrit 38.4 - 49.9 % 30.4(L) 30.9(L) 33.4(L)  Platelets 140 - 400 K/uL 221 339 327   CMP Latest Ref Rng & Units 03/27/2018 03/21/2018 03/14/2018  Glucose 70 - 99 mg/dL 116(H) 108(H) 116(H)  BUN 8 - 23 mg/dL 8 7(L) 4(L)  Creatinine 0.61 - 1.24 mg/dL 0.65 0.60(L) 0.68  Sodium 135 - 145 mmol/L 136 137 137  Potassium 3.5 - 5.1 mmol/L 4.0 3.6 3.2(L)  Chloride 98 - 111 mmol/L  101 105 101  CO2 22 - 32 mmol/L _0 Calcium 8.9 - 10.3 mg/dL 9.1 8.9 9.1  Total Protein 6.5 - 8.1 g/dL 7.0 6.9 6.9  Total Bilirubin 0.3 - 1.2 mg/dL 0.4 0.3 0.3  Alkaline Phos 38 - 126 U/L 627(H) 425(H) 344(H)  AST 15 - 41 U/L 33 43(H) 31  ALT 0 - 44 U/L _1 Magnesium: 1.4      CEA ([facility])  10/27/2016: 7.81 11/27/2016: 5.56 01/10/2017: 5.61 03/14/17: 5.11 04/11/17: 5.87 05/23/17: 8.79 06/19/17:  9.15 07/16/17:  9.88 08/10/17:  10.40 09/18/17: 8.31 10/30/17: 8.11 11/20/17: 7.96 12/25/2017: 13.95 01/17/18: 10.68 02/14/18: 7.17 03/14/18: 10.76   PATHOLOGY:  10/29/17 Diagnosis Lymph node, needle/core biopsy, left supraclavicular - METASTATIC ADENOCARCINOMA, CONSISTENT WITH COLORECTAL PRIMARY. - SEE COMMENT. Microscopic Comment The malignant cells are positive for CDX-2 and cytokeratin 20. They are negative for cytokeratin 7, p63 and cytokeratin 5/6. The findings are consistent with primary colorectal adenocarcinoma. Dr. Thressa Sheller has reviewed the case and concurs with this interpretation. Dr. Burr Medico was paged on 10/30/17. Additional studies can be performed upon clinician request. (JBK:gt, 10/31/17)   Diagnosis 02/21/17 1. Colon, segmental resection for tumor, rectosigmoid - INVASIVE ADENOCARCINOMA, MODERATELY DIFFERENTIATED, SPANNING 3.9 CM. - TUMOR INVADES THROUGH MUSCULARIS PROPRIA. - RESECTION MARGINS ARE NEGATIVE. - EIGHT OF EIGHT LYMPH NODES NEGATIVE FOR CARCINOMA (0/8), SEE COMMENT. - SEE ONCOLOGY TABLE. 2. Soft tissue, biopsy, left lateral pelvic floor - BENIGN SKELETAL MUSCLE AND SOFT TISSUE. - NO MALIGNANCY IDENTIFIED. 3. Colon, segmental resection, proximal sigmoid - BENIGN COLONIC MUCOSA. - NO DYSPLASIA OR MALIGNANCY. 4. Liver, biopsy, liver mass - BENIGN VASCULAR PROLIFERATION CONSISTENT WITH HEMANGIOMA. - NO MALIGNANCY IDENTIFIED. Microscopic Comment 1. COLON AND RECTUM (INCLUDING TRANS-ANAL RESECTION): Specimen: Rectosigmoid colon with anus. Procedure: Abdominoperineal resection. Tumor site: Distal 1/3 rectum to anus. Specimen integrity: Intact. Macroscopic intactness of mesorectum: Incomplete. Macroscopic tumor perforation: Not identified. Invasive tumor: Maximum size: 3.9 cm Histologic type(s): Invasive adenocarcinoma. Histologic grade and differentiation: G2: moderately differentiated/low grade Type of polyp in which invasive carcinoma arose: N/A. Microscopic extension of invasive tumor:  Tumor invades through muscularis propria. Lymph-Vascular invasion: Not identified. 1 of 3 FINAL for BRAVLIO, LUCA [phone]) Microscopic Comment(continued) Peri-neural invasion: Present. Tumor deposit(s) (discontinuous extramural extension): Not identified. Resection margins: Proximal margin: Negative. Distal margin: Negative. Circumferential (radial) (posterior ascending, posterior descending; lateral and posterior mid-rectum; and entire lower 1/3 rectum): Negative. Mesenteric margin (sigmoid and transverse): Negative. Distance closest margin (if all above margins negative): <0.1 cm radial margin. >1 cm distal margin. Treatment effect (neo-adjuvant therapy): Present Additional polyp(s): N/A. Non-neoplastic findings: None. Lymph nodes: number examined 8; number positive: 0 (treatment effect present). Pathologic Staging: ypT3, ypN0, ypMX Ancillary studies: Can be performed upon request. Comment: The fat was cleared and additional lymph nodes were not identified.  Diagnosis 10/09/2016 Rectum, biopsy, mass - ADENOCARCINOMA.  RADIOGRAPHIC STUDIES: I have personally reviewed the radiological images as listed and agreed with the findings in the report.  CT CAP with contrast, 01/08/2018 IMPRESSION: 1. Today's study demonstrates dramatic progression of disease with interval development of widespread pulmonary metastases, multiple new liver lesions and progressive lymphadenopathy noted in the pelvis, retroperitoneum and left supraclavicular nodal stations. In addition, the previously noted metastatic lesion in the right psoas muscle has markedly increased in size. Whether or not this reflects growth of the lesion and/or internal hemorrhage of the lesion is uncertain. 2. Stable postoperative thickening and small postoperative fluid collection in the presacral space, as above,  following abdominal perineal resection. 3. Large right inguinal hernia containing several loops of small bowel,  without evidence of bowel incarceration or obstruction at this time.  PET Scan 10/17/17 IMPRESSION: Increased hypermetabolic lymphadenopathy in abdominal retroperitoneum, right common iliac chain, and left supraclavicular region, consistent with metastatic disease. Stable tiny bilateral pulmonary nodules show no FDG uptake but are too small to characterize by PET. Recommend continued attention on follow-up CT. 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   CT CAP W Contrast 09/18/17 IMPRESSION: Interval abdomino-peritoneal resection with left lower quadrant colostomy. Increased diffuse cystitis, likely due to radiation. New presacral soft tissue density with central low attenuation, also suspicious for post treatment changes. Recommend continued attention on follow-up imaging. New mild retroperitoneal lymphadenopathy in the aortocaval space, measuring up to 1.8 cm. This is suspicious for metastatic disease, although it is conceivable that this could be reactive in etiology due to interval surgery and radiation therapy. Recommend short-term follow-up by CT in 3 months. New indeterminate 4 mm left upper lobe pulmonary nodule, likely inflammatory in etiology with metastatic disease considered less likely. Recommend continued attention on follow-up CT. Bilateral inguinal hernias, largest on the right containing several small bowel loops.   Colonoscopy 08/15/17 IMPRESSION - Patent end colostomy with healthy appearing mucosa in the sigmoid colon. - The entire examined colon is normal. - No specimens collected.  MRI Pelvis W WO Contrast 01/29/17 IMPRESSION: Significant decrease in size of soft tissue mass at the anorectal junction, with post radiation changes. No residual perirectal lymphadenopathy or other signs of pelvic metastatic disease. Stable small right inguinal hernia containing small bowel.  CT A/P W Contrast 12/07/16 IMPRESSION: 1.  Abnormal dilatation of the proximal small bowel loops worrisome for either small bowel obstruction versus small bowel ileus. 2. 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 3. Large right inguinal hernia containing edematous loop of distal small bowel 4. No pneumatosis or bowel perforation.  No abscess. 5. Aortic atherosclerosis 6. Mild decrease in size of rectal mass. No change in right common iliac adenopathy.  DG Chest 2 View 12/03/2016 IMPRESSION: 1. No active cardiopulmonary disease. No evidence of pneumonia or pulmonary edema. No evidence of intrathoracic metastasis. 2. Hyperexpanded lungs suggesting COPD.  MRI Pelvis 10/24/2016 IMPRESSION: 1. Moderately motion degraded exam. 2. Large mass is centered at the anus with extension into the low rectum. No complicating obstruction. 3. Mild mesorectal adenopathy, suspicious. 4. Right inguinal hernia containing nonobstructive small bowel.  CT CAP w Contrast 10/13/2016 IMPRESSION: Large irregular enhancing rectal mass consistent with known rectal cancer. There are small perirectal and small pelvic sidewall lymph nodes which are suspicious. Prominent perirectal vessels are also noted. No evidence of metastatic retroperitoneal or sigmoid mesocolon adenopathy or hepatic or pulmonary metastasis.  COLONOSCOPY 10/09/2016 Dr. Hilarie Fredrickson  A fungating, infiltrative and ulcerated partially obstructing large mass was found in the distal rectum. The mass was circumferential. The mass measured seven cm in length (prolapsing and from dentate line to approx 7 cm). In addition, its inner diameter measured nine mm. Oozing was present. This was biopsied with a cold forceps for histology. - Due to the distal rectal mass the bowel preparation was poor and unable to be cleared via the adult upper endoscope (adult upper endoscope used due to obstructing mass).  Scope not advanced proximal to the rectosigmoid colon.  ASSESSMENT & PLAN: HARRIET BOLLEN 62 y.o.  gentleman, previously healthy, presented  with an anal mass and a mild intermittent rectal bleeding for 5-6 months. Fatigue, anorexia, fatigue and weight loss for a month. He is currently on radiation for his rectal cancer, chemotherapy has been held lately.  1. Rectal Adenocarcinoma, low rectum, [id number], ypT3N0, stage IIIB, MSI-stable, nodes metastasis 09/2017, KRAS/NRAS/BRAF wild type  -I previously reviewed his CT and MRI scan, colonoscopy and biopsy findings in detail with pt and his family members -Based on his imaging findings, he had clinical stage III disease. We reviewed his image in our GI tumor Board, he also has a small liver lesion which is indeterminate, likely benign.  -patient received neoadjuvant chemotherapy and radiation with Xeloda 10/1416-12/19/16, which was held for the last few weeks due to infectious colitis. -He underwent APR on 02/21/2017, surgical margins were negative, all lymph nodes were negative. We've reviewed his surgical pathology findings he did have significant treatment effect on his surgical sample.  -He recovered well from surgery, started adjuvant CAPOX 04/04/17. He completed 5 cycles of oxaliplatin on 07/16/17 and completed Xeloda on 08/12/17. His oxaliplatin treatment was postponed a few times due to his working schedule -He unfortunately had recurrence in 09/2017 -I discussed the treatment option. Given his rapid Recurrence (2 months) after completion of adjuvant Capox, I recommend FOLFIRI as his next line therapy. -He went to [facility] for 2nd opinion with Dr. Aleatha Borer on 3/18 and understands he is not a candidate for clinical trial [id number] at [facility] and chose to proceed with current chemotherapy plan.  -He started FOLFIRI on 11/20/17 and has tolerated first cycle well.  -His FO genomic testing showed MSI stable disease, K-ras, NRAS, BRAF wild-type, no other targetable mutations.   Panitumumab was added on from cycle 3.  -I previously reviewed his 01/08/18 restaging CT scan findings with patient and his family, unfortunately he has dramatic progression of the right metastatic lesion in the right psoas muscle which is 13.9 cm, and he also developed new pulmonary, liver metastasis. We discussed this could be partially related to his delay of treatment, and he has had only 3 cycle chemo, 1 cycle of panitumumab. Unfortunately the biology of his tumor is extremely aggressive, I recommend him to consider more intense chemotherapy FOLFIRINOX every 2 weeks and continue panitumumab -Due to his worsening right flank pain, he received palliative radiation to the right psoas muscle mass.  His pain has significantly improved after radiation. -He tolerated first cycle FOLFIRINOX with dose reduction well overall, but lost weight. I previously encouraged him to take more high-calorie and high-protein diet.  He does not think he needs to see dietitian. His cycle 2-4 chemo dose was further reduced due to the tolerance issue. -With further reduced chemo he was able to manage with fatigue and skin cracking. I suggest he take biotin supplement and he continue topical treatment and include hydrocortisone.  -Labs reviewed, hg at 9.7, alk phos at 627, Magnesium normal at 1.7. Continue oral magnesium.  -Plan to rescan after cycle 5 on 04/10/18  -F/u in 1 week for cycle 5 chemo   2. 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 This was confirmed by his iron study. -His daughter was diagnosed with hemochromatosis (homozygous), his genetic testing showed he is a carrier -He received IV iron once, he previously took some oral iron, I'll be cautious about IV iron replacement due to  his hemachromatosis carrier status. -CBC  and iron level are closely monitored.  -Hg decreased to 9.7 today (03/27/18)   3. Peripheral neuropathy, secondary to chemo, G1   -mild numbness, no decreased sensation on exam, hand function is normal  -He had slightly more noticeable neuropathy, but hand functions are normal and adequate for treatment -Neuropathy in feet mild and intermittent, Stable. Continue Neurontin and B complex  4.Goal of care discussion  -We again discussed the incurable nature of his cancer, and the overall poor prognosis due to the metastatic disease, especially if he does not have good response to chemotherapy or progress on chemo -The patient understands the goal of care is palliative, to prolong his life. -He is full code for now   5. Right sacral and anterolateral upper leg pain , now resolved, with associated numbness to mid-thigh   -Likely secondary to right iliac adenopathy and psoas involvement. Has been taking tramadol 2 tabs BID and supplementing with tylenol as well as integrating non-pharm measures.  -Pain is well managed but interfering with sleep. His work requires him to be on his feet for prolonged periods. He does not like "fog" and constipation effects of tramadol, therefore not interested in escalating opioids at this time.  -Pain resolved after palliative radiation. He no longer is taking oxycodone, just Tylenol and CBD oil pen.  -His gluteal pain is MSK. PT is helping with his balance and strength, numbness in legs and ambulating improved.  6. Constipation  -He uses Senakot and miralax PRN and will use magnesium citrate as a back up if he still has no relief.  -He understands indications for using laxative vs anti-diarrheal if he develops change in his bowel habits.  -I previously encouraged him to increase water intake if he develops diarrhea and call clinic if not controlled by imodium -Constipation resolved, likely due to chemotherapy. Bowel habits overall regular.  7. Rash on face and  neck, secondary to panitumumab  -I previously prescribed doxycycline to help control his rash.  -I previously recommended he uses a hat and sunscreen when out in the sun.  -Rash improved with oral doxycycline and topical treatments although Doxycycline caused GI upset. Will monitor closely.  -Rash mild  -continue clindamycin and hydrocortisone. If rash worsens with white-heads I suggest he restart doxycycline.     8. Weight loss, Low appetite  -Secondary to chemo and associated with loss of appetite  -He previously agreed to increase his calorie intake -He has a dietitian friend, does not feel needing to meet our dietitian  -Continue nutritional supplements  -Appetite is adequate now, weight is mostly stable. Continue nutritional supplements and increase calorie intake   9. Hypokalemia, hypomagnesium, secondary to panitumumab  -He was previously prescribed Oral potassium 36mq on 02/14/18, he will continue twice daily  -He was previously prescribed mag-ox 1 tab BID replacement on 02/27/18 -K at 4.0 and Mg at 1.7 today (03/27/18)  -Continue Oral K BID and Oral Mag BID.   10. Skin/nail toxicity, secondary to chemotherapy likely 5FU  -NP Lacie recommended warm soaks for his hands/nails and topical hydrocortisone for mild hand-foot peeling after treatment -I recommend he continue topical treatment and suggest he start biotin supplement    11. Oral Thrush -secondary to chemo and steroids -resolved now  -He has nystatin mouthwash, I recommend he continue daily dose for prophylaxis.     Plan: -He is tolerating current doe chemo better  -lab, flush, f/u and chemo FOLFIRINOX and vectibix in 1 week  -  CT CAP on 8/14   All questions were answered. The patient knows to call the clinic with any problems, questions or concerns.  I spent 20 minutes counseling the patient face to face. The total time spent in the appointment was 25 minutes and more than 50% was on counseling.  Oneal Deputy,  am acting as scribe for Truitt Merle, MD.   I have reviewed the above documentation for accuracy and completeness, and I agree with the above.      Truitt Merle, MD 03/27/2018

## 2018-04-03 NOTE — Progress Notes (Signed)
[place]  [phone] [phone]) [phone]  Clinic Follow up Note   Patient Care Team: Robyn Haber, MD as PCP - General (Family Medicine) Michael Boston, MD as Consulting Physician (General Surgery) Pyrtle, Lajuan Lines, MD as Consulting Physician (Gastroenterology) Truitt Merle, MD as Consulting Physician (Hematology and Oncology) Kyung Rudd, MD as Consulting Physician (Radiation Oncology) 04/04/2018  SUMMARY OF ONCOLOGIC HISTORY: Oncology History   Cancer Staging Rectal adenocarcinoma [facility]) Staging form: Colon and Rectum, AJCC 8th Edition - Clinical stage from 10/09/2016: Stage IIIB (cT3, cN1b, cM0) - Signed by Truitt Merle, MD on 10/25/2016 - Pathologic stage from 02/21/2017: Stage IIA (ypT3, pN0, cM0) - Signed by Truitt Merle, MD on 10/18/2017       Rectal adenocarcinoma ([place])   10/03/2016 - 10/03/2016 Hospital Admission    Admit date: 10/03/16 The patient presented to the [facility] ED with rectal bleeding and difficulty with hygiene.    10/09/2016 Initial Diagnosis    Rectal adenocarcinoma     10/09/2016 Procedure    Colonoscopy showed a fungating, infiltrative and ulcerated partially obstructing large mass in the distal rectum, measuring 9 cm, prolapsing at anal canal. Scope not advanced proximal to the rectosigmoid colon.    10/09/2016 Initial Biopsy    Rectal mass biopsy showed invasive adenocarcinoma.    10/13/2016 Imaging    CT Chest Abdomen Pelvis w contrast IMPRESSION: Large irregular enhancing rectal mass consistent with known rectal cancer. There are small perirectal and small pelvic sidewall lymph nodes which are suspicious. Prominent perirectal vessels are also noted. No evidence of metastatic retroperitoneal or sigmoid mesocolon adenopathy or hepatic or pulmonary metastasis.    10/24/2016 Imaging    MRI Pelvis IMPRESSION: 1. Moderately motion degraded exam. 2. Large mass is centered at the anus with extension into the low rectum. No  complicating obstruction. 3. Mild mesorectal adenopathy, suspicious. 4. Right inguinal hernia containing nonobstructive small bowel.     11/08/2016 - 12/19/2016 Chemotherapy    Xeloda 50 mg twice daily, with concurrent radiation, held since 12/03/2016 due to colitis and hospitalization    11/08/2016 - 12/19/2016 Radiation Therapy    Neoadjuvant radiation to his rectal cancer  1) Pelvis/ 45 Gy in 25 fractions  2) Rectum boost/ 9 Gy in 5 fractions Under the care of Dr. Lisbeth Renshaw.    12/03/2016 - 12/09/2016 Hospital Admission    He presented with few days to one week history of diarrhea with 6-7 loose stools daily, along with a fever of 101.5 on the day of admission. Patient was also noted to be hypokalemic, hyponatremic. Hospitalized for further management. Stool studies were sent. C. difficile was negative. GI pathogen panel was positive for Yersinia.  XELODA was held at this time, until infection has cleared.     12/07/2016 Imaging    CT A/P W Contrast  IMPRESSION: 1. Abnormal dilatation of the proximal small bowel loops worrisome for either small bowel obstruction versus small bowel ileus. 2. 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 3. Large right inguinal hernia containing edematous loop of distal small bowel 4. No pneumatosis or bowel perforation.  No abscess. 5. Aortic atherosclerosis 6. Mild decrease in size of rectal mass. No change in right common iliac adenopathy.    02/21/2017 Surgery    XI ROBOT ABDOMINOPERINEAL RESECTION WITH COLOSTOMY by Dr. Johney Maine    02/21/2017 Pathology Results    Diagnosis 02/21/17 1. Colon, segmental resection for tumor, rectosigmoid -  INVASIVE ADENOCARCINOMA, MODERATELY DIFFERENTIATED, SPANNING 3.9 CM. - TUMOR INVADES THROUGH MUSCULARIS PROPRIA. - RESECTION MARGINS ARE NEGATIVE. - EIGHT OF EIGHT LYMPH NODES NEGATIVE FOR CARCINOMA (0/8), SEE COMMENT. - SEE  ONCOLOGY TABLE. 2. Soft tissue, biopsy, left lateral pelvic floor - BENIGN SKELETAL MUSCLE AND SOFT TISSUE. - NO MALIGNANCY IDENTIFIED. 3. Colon, segmental resection, proximal sigmoid - BENIGN COLONIC MUCOSA. - NO DYSPLASIA OR MALIGNANCY. 4. Liver, biopsy, liver mass - BENIGN VASCULAR PROLIFERATION CONSISTENT WITH HEMANGIOMA. - NO MALIGNANCY IDENTIFIED.    04/04/2017 - 08/12/2017 Chemotherapy    CAPOX every 3 weeks for 6 cycles. I decreased his first cycle of oxaliplatin platinum by 15%, he tolerated well, so we increased to full dose from cycle 2. He completed 5 cycles of oxaliplatin on 07/16/17 and completed Xeloda on 08/12/17.      08/15/2017 Procedure    Colonoscopy 08/15/17 IMPRESSION - Patent end colostomy with healthy appearing mucosa in the sigmoid colon. - The entire examined colon is normal. - No specimens collected.    09/18/2017 Imaging    CT CAP W Contrast 09/18/17 IMPRESSION: Interval abdomino-peritoneal resection with left lower quadrant colostomy. Increased diffuse cystitis, likely due to radiation. New presacral soft tissue density with central low attenuation, also suspicious for post treatment changes. Recommend continued attention on follow-up imaging. New mild retroperitoneal lymphadenopathy in the aortocaval space, measuring up to 1.8 cm. This is suspicious for metastatic disease, although it is conceivable that this could be reactive in etiology due to interval surgery and radiation therapy. Recommend short-term follow-up by CT in 3 months. New indeterminate 4 mm left upper lobe pulmonary nodule, likely inflammatory in etiology with metastatic disease considered less likely. Recommend continued attention on follow-up CT. Bilateral inguinal hernias, largest on the right containing several small bowel loops.    10/17/2017 PET scan    MPRESSION: Increased hypermetabolic lymphadenopathy in abdominal retroperitoneum, right common iliac chain, and left  supraclavicular region, consistent with metastatic disease. Stable tiny bilateral pulmonary nodules show no FDG uptake but are too small to characterize by PET. Recommend continued attention on follow-up CT. 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    10/29/2017 Relapse/Recurrence    Diagnosis Lymph node, needle/core biopsy, left supraclavicular - METASTATIC ADENOCARCINOMA, CONSISTENT WITH COLORECTAL PRIMARY. - SEE COMMENT.    11/20/2017 - 01/17/2018 Chemotherapy    FOLFIRI every 2 weeks, panitumumab was added from cycle 3.  FOLFIRI was held after cycle 3 due to disease progression, he continued with 5-FU and Panitumumab until further disease progression.      01/08/2018 Imaging    CT CAP with contrast IMPRESSION: 1. Today's study demonstrates dramatic progression of disease with interval development of widespread pulmonary metastases, multiple new liver lesions and progressive lymphadenopathy noted in the pelvis, retroperitoneum and left supraclavicular nodal stations. In addition, the previously noted metastatic lesion in the right psoas muscle has markedly increased in size. Whether or not this reflects growth of the lesion and/or internal hemorrhage of the lesion is uncertain. 2. Stable postoperative thickening and small postoperative fluid collection in the presacral space, as above, following abdominal perineal resection. 3. Large right inguinal hernia containing several loops of small bowel, without evidence of bowel incarceration or obstruction at this time.    01/14/2018 - 01/24/2018 Radiation Therapy    Radiation with Dr. Lisbeth Renshaw starting 01/14/18. Plan to complete on 01/24/18    01/31/2018 -  Chemotherapy    FOLFIRINOX plus Vectibix every 2 weeks starting 01/31/18   CURRENT THERAPY:  Candie Chroman  with dose reduction plus panitumumab every 2 weeks started on 01/31/18, has been dose reduced with cycle 2-4.   INTERVAL HISTORY: Mr. Wik returns for  follow up and next cycle chemo as scheduled. He has persistent fluctuating fatigue that does not completely recover between cycles. He continues to be able to perform ADLs but requires rest after. Appetite is present, but po intake fluctuates. Takes nystatin BID for thrush prophylaxis. No mucositis. BM regular. Has mild intermittent "throat clearing" dry cough for few weeks; denies phlegm, chest pain, dyspnea, fever, or chills. Has mild right hip/gluteal discomfort he feels is muscular and r/t physical therapy; he attends 2 sessions per weak. Went once this week due to scheduling. Ibuprofen helps. Right leg continues to get stronger. Continues to note nerve impulses to anterior thigh. Neuropathy is stable in his feet; no longer taking gabapentin. Nailbed sensitivity is present. Keeps hands and feet hydrated with lotions. Face rash is stable, maintained with topical lotion and gels. Does not take doxy. He has diffuse dry and scaly skin to back, chest, abdomen, and arms. He woke up with acute stabbing right shoulder pain and "spasm" after a long nap that also occurs with deep inspiration. Improving gradually, especially with massage.   REVIEW OF SYSTEMS:   Constitutional: Denies fevers, chills or abnormal weight loss (+) fatigue  Ears, nose, mouth, throat, and face: Denies mucositis or sore throat Respiratory: Denies dyspnea or wheezes (+) intermittent mild dry cough for few weeks  Cardiovascular: Denies palpitation, chest discomfort or lower extremity swelling Gastrointestinal:  Denies nausea, vomiting, constipation, diarrhea, heartburn or change in bowel habits Skin: (+) skin rash to face, back, chest, arms (+) dry skin  Lymphatics: Denies new lymphadenopathy or easy bruising Neurological:Denies new weaknesses (+) neuropathy to feet, stable (+) right leg weakness, improving  Behavioral/Psych: Mood is stable, no new changes  MSK: (+) right hip/gluteal pain (+) acute right shoulder pain, improving   All  other systems were reviewed with the patient and are negative.  MEDICAL HISTORY:  Past Medical History:  Diagnosis Date  . Allergy   . Cancer ([place]) 10/09/2016   rectal adenocarcinoma  . Inguinal hernia     SURGICAL HISTORY: Past Surgical History:  Procedure Laterality Date  . COLONOSCOPY W/ BIOPSIES Bilateral 10/09/2016   rectal adenocarcinoma  . IR FLUORO GUIDE PORT INSERTION RIGHT  11/13/2017  . IR US GUIDE VASC ACCESS RIGHT  11/13/2017  . MOUTH SURGERY     age 52, to remove extra "eye" teeth  . [place] teeth extraction    . XI ROBOT ABDOMINAL PERINEAL RESECTION N/A 02/21/2017   Procedure: XI ROBOT ABDOMINOPERINEAL RESECTION WITH COLOSTOMY;  Surgeon: Michael Boston, MD;  Location: [facility];  Service: General;  Laterality: N/A;    I have reviewed the social history and family history with the patient and they are unchanged from previous note.  ALLERGIES:  is allergic to sulfa antibiotics.  MEDICATIONS:  Current Outpatient Medications  Medication Sig Dispense Refill  . acetaminophen (TYLENOL) 500 MG tablet Take 500 mg by mouth every 6 (six) hours as needed.    . clindamycin (CLINDAGEL) 1 % gel Apply topically 2 (two) times daily. 60 g 1  . magnesium oxide (MAG-OX) 400 (241.3 Mg) MG tablet Take 1 tablet (400 mg total) by mouth 2 (two) times daily. 60 tablet 1  . Multiple Vitamin (MULTIVITAMIN WITH MINERALS) TABS tablet Take 1 tablet by mouth every other day.     . nystatin (MYCOSTATIN) 100000 UNIT/ML suspension Take 5 mLs (  500,000 Units total) by mouth 4 (four) times daily. 473 mL 0  . omeprazole (PRILOSEC) 40 MG capsule Take 1 capsule (40 mg total) by mouth daily. 30 capsule 2  . potassium chloride SA (K-DUR,KLOR-CON) 20 MEQ tablet Take 1 tablet (20 mEq total) by mouth 2 (two) times daily. 60 tablet 1  . dexamethasone (DECADRON) 4 MG tablet Take 1 tablet (4 mg total) by mouth daily. Start the day after chemo for 3-5 days. (Patient not taking: Reported on 04/04/2018) 10 tablet 2  .  ondansetron (ZOFRAN) 8 MG tablet Take 1 tablet (8 mg total) by mouth 2 (two) times daily as needed for refractory nausea / vomiting. Start on day 3 after chemotherapy. (Patient not taking: Reported on 04/04/2018) 30 tablet 1  . prochlorperazine (COMPAZINE) 10 MG tablet Take 1 tablet (10 mg total) by mouth every 6 (six) hours as needed (NAUSEA). (Patient not taking: Reported on 04/04/2018) 30 tablet 1   Current Facility-Administered Medications  Medication Dose Route Frequency Provider Last Rate Last Dose  . 0.9 %  sodium chloride infusion  500 mL Intravenous Continuous Pyrtle, Lajuan Lines, MD      . 0.9 %  sodium chloride infusion  500 mL Intravenous Once Pyrtle, Lajuan Lines, MD       Facility-Administered Medications Ordered in Other Visits  Medication Dose Route Frequency Provider Last Rate Last Dose  . atropine injection 0.5 mg  0.5 mg Intravenous Once PRN Truitt Merle, MD      . dextrose 5 % solution   Intravenous Continuous Truitt Merle, MD 20 mL/hr at 04/04/18 1245    . fluorouracil (ADRUCIL) 3,400 mg in sodium chloride 0.9 % 82 mL chemo infusion  2,000 mg/m2 (Order-Specific) Intravenous 1 day or 1 dose Truitt Merle, MD        PHYSICAL EXAMINATION: ECOG PERFORMANCE STATUS: 2-3   Vitals:   04/04/18 0921  BP: 101/76  Pulse: 89  Resp: 18  SpO2: 100%   Filed Weights   04/04/18 0921  Weight: 123 lb 12.8 oz (56.2 kg)    GENERAL:alert, no distress and comfortable SKIN: scattered acne-type rash to face, chest, back, and upper arms with diffuse dry peeling skin  EYES:  sclera clear OROPHARYNX:no thrush or ulcers  LYMPH:  Pea-sized left supraclavicular LN  LUNGS: clear to auscultation with normal breathing effort HEART: regular rate & rhythm, no lower extremity edema ABDOMEN:abdomen soft, non-tender and normal bowel sounds. Large abdominal mass is smaller  Musculoskeletal:no cyanosis of digits and no clubbing  NEURO: alert & oriented x 3 with fluent speech, mildly weak right leg  MSK: no palpable  abnormality or tenderness to palpation of spine, right scapula, or shoulder  PAC without erythema   LABORATORY DATA:  I have reviewed the data as listed CBC Latest Ref Rng & Units 04/04/2018 03/27/2018 03/21/2018  WBC 4.0 - 10.3 K/uL 26.3(H) 22.7(H) 15.1(H)  Hemoglobin 13.0 - 17.1 g/dL 10.1(L) 9.7(L) 10.3(L)  Hematocrit 38.4 - 49.9 % 31.8(L) 30.4(L) 30.9(L)  Platelets 140 - 400 K/uL 441(H) 221 339     CMP Latest Ref Rng & Units 04/04/2018 03/27/2018 03/21/2018  Glucose 70 - 99 mg/dL 142(H) 116(H) 108(H)  BUN 8 - 23 mg/dL 12 8 7(L)  Creatinine 0.61 - 1.24 mg/dL 0.79 0.65 0.60(L)  Sodium 135 - 145 mmol/L 136 136 137  Potassium 3.5 - 5.1 mmol/L 3.8 4.0 3.6  Chloride 98 - 111 mmol/L 101 101 105  CO2 22 - 32 mmol/L 20(L) 25 23  Calcium 8.9 -  10.3 mg/dL 9.2 9.1 8.9  Total Protein 6.5 - 8.1 g/dL 7.1 7.0 6.9  Total Bilirubin 0.3 - 1.2 mg/dL 0.6 0.4 0.3  Alkaline Phos 38 - 126 U/L 730(H) 627(H) 425(H)  AST 15 - 41 U/L 49(H) 33 43(H)  ALT 0 - 44 U/L 17 19 14         CEA ([facility])  10/27/2016: 7.81 11/27/2016: 5.56 01/10/2017: 5.61 03/14/17: 5.11 04/11/17: 5.87 05/23/17: 8.79 06/19/17:  9.15 07/16/17:  9.88 08/10/17: 10.40 09/18/17: 8.31 10/30/17: 8.11 11/20/17: 7.96 12/25/2017: 13.95 01/17/18: 10.68 02/14/18: 7.17 03/14/18: 10.76   PATHOLOGY:  10/29/17 Diagnosis Lymph node, needle/core biopsy, left supraclavicular - METASTATIC ADENOCARCINOMA, CONSISTENT WITH COLORECTAL PRIMARY. - SEE COMMENT. Microscopic Comment The malignant cells are positive for CDX-2 and cytokeratin 20. They are negative for cytokeratin 7, p63 and cytokeratin 5/6. The findings are consistent with primary colorectal adenocarcinoma. Dr. Thressa Sheller has reviewed the case and concurs with this interpretation. Dr. Burr Medico was paged on 10/30/17. Additional studies can be performed upon clinician request. (JBK:gt, 10/31/17)   Diagnosis 02/21/17 1. Colon, segmental resection for tumor, rectosigmoid - INVASIVE  ADENOCARCINOMA, MODERATELY DIFFERENTIATED, SPANNING 3.9 CM. - TUMOR INVADES THROUGH MUSCULARIS PROPRIA. - RESECTION MARGINS ARE NEGATIVE. - EIGHT OF EIGHT LYMPH NODES NEGATIVE FOR CARCINOMA (0/8), SEE COMMENT. - SEE ONCOLOGY TABLE. 2. Soft tissue, biopsy, left lateral pelvic floor - BENIGN SKELETAL MUSCLE AND SOFT TISSUE. - NO MALIGNANCY IDENTIFIED. 3. Colon, segmental resection, proximal sigmoid - BENIGN COLONIC MUCOSA. - NO DYSPLASIA OR MALIGNANCY. 4. Liver, biopsy, liver mass - BENIGN VASCULAR PROLIFERATION CONSISTENT WITH HEMANGIOMA. - NO MALIGNANCY IDENTIFIED. Microscopic Comment 1. COLON AND RECTUM (INCLUDING TRANS-ANAL RESECTION): Specimen: Rectosigmoid colon with anus. Procedure: Abdominoperineal resection. Tumor site: Distal 1/3 rectum to anus. Specimen integrity: Intact. Macroscopic intactness of mesorectum: Incomplete. Macroscopic tumor perforation: Not identified. Invasive tumor: Maximum size: 3.9 cm Histologic type(s): Invasive adenocarcinoma. Histologic grade and differentiation: G2: moderately differentiated/low grade Type of polyp in which invasive carcinoma arose: N/A. Microscopic extension of invasive tumor: Tumor invades through muscularis propria. Lymph-Vascular invasion: Not identified. 1 of 3 FINAL for ELIYOHU, CLASS [id number]) Microscopic Comment(continued) Peri-neural invasion: Present. Tumor deposit(s) (discontinuous extramural extension): Not identified. Resection margins: Proximal margin: Negative. Distal margin: Negative. Circumferential (radial) (posterior ascending, posterior descending; lateral and posterior mid-rectum; and entire lower 1/3 rectum): Negative. Mesenteric margin (sigmoid and transverse): Negative. Distance closest margin (if all above margins negative): <0.1 cm radial margin. >1 cm distal margin. Treatment effect (neo-adjuvant therapy): Present Additional polyp(s): N/A. Non-neoplastic findings: None. Lymph nodes: number examined  8; number positive: 0 (treatment effect present). Pathologic Staging: ypT3, ypN0, ypMX Ancillary studies: Can be performed upon request. Comment: The fat was cleared and additional lymph nodes were not identified.  Diagnosis 10/09/2016 Rectum, biopsy, mass - ADENOCARCINOMA.    RADIOGRAPHIC STUDIES: I have personally reviewed the radiological images as listed and agreed with the findings in the report. No results found.   ASSESSMENT & PLAN: LACHARLES ALTSCHULER 62 y.o.  gentleman, previously healthy, presented with an anal mass and a mild intermittent rectal bleeding for 5-6 months. Fatigue, anorexia, fatigue and weight loss for a month. He is currently on radiation for his rectal cancer, chemotherapy has been held lately.  1. Rectal Adenocarcinoma, low rectum, [id number], ypT3N0, stage IIIB, MSI-stable, nodes metastasis 09/2017, KRAS/NRAS/BRAF wild type  2. 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 Peripheral neuropathy, secondary to chemo, G1   4.Goal of care  discussion  5. Right sacral and anterolateral upper leg pain , now resolved, with associated numbness to mid-thigh   6. Constipation 7. Rash on face, neck, secondary to panitumumab  8. Weight loss, low appetite  9. Hypokalemia, hypomagnesemia, secondary to panitumumab  10. Skin/nail toxicity, secondary to chemotherapy likely 5FU 11. Oral thrush, on prophylactic nystatin BID   Mr. Cornforth appears stable. He completed another cycle of dose-reduced FOLFIRINOX and panitumumab. He is tolerating treatment fairly well overall with persistent fatigue, neuropathy, and skin toxicity. He continues topical therapy. Labs reviewed, CBC with leukocytosis, secondary to Udenyca, and thrombocytosis. CMP with normal K, he will continue once daily. Alk phos is trending up. Mag 1.6; has been on mag-ox once daily. I recommend he increase to BID. Labs adequate to proceed  with cycle 9 FOLFIRINOX at current dose. He has no significant side effects with Udenyca, will continue. He will undergo restaging CT 8/14, will f/u with results. Return in 2 weeks for f/u and treatment.   PLAN: -Labs reviewed, proceed with FOLFIRINOX and panitumumab at current doses, udenyca on 8/10 with pump d/c -Continue oral K once daily -Increase mag-ox to 1 tab BID -Restaging CT 8/14 -F/u in 2 weeks with next cycle   All questions were answered. The patient knows to call the clinic with any problems, questions or concerns. No barriers to learning was detected. I spent 20 minutes counseling the patient face to face. The total time spent in the appointment was 25 minutes and more than 50% was on counseling and review of test results     Alla Feeling, NP 04/04/18

## 2018-04-04 ENCOUNTER — Inpatient Hospital Stay (HOSPITAL_BASED_OUTPATIENT_CLINIC_OR_DEPARTMENT_OTHER): Payer: BLUE CROSS/BLUE SHIELD | Admitting: Nurse Practitioner

## 2018-04-04 ENCOUNTER — Telehealth: Payer: Self-pay | Admitting: Nurse Practitioner

## 2018-04-04 ENCOUNTER — Inpatient Hospital Stay: Payer: BLUE CROSS/BLUE SHIELD

## 2018-04-04 ENCOUNTER — Inpatient Hospital Stay: Payer: BLUE CROSS/BLUE SHIELD | Attending: Hematology

## 2018-04-04 ENCOUNTER — Encounter: Payer: Self-pay | Admitting: Nurse Practitioner

## 2018-04-04 VITALS — BP 101/76 | HR 89 | Resp 18 | Ht 72.0 in | Wt 123.8 lb

## 2018-04-04 DIAGNOSIS — G620 Drug-induced polyneuropathy: Secondary | ICD-10-CM

## 2018-04-04 DIAGNOSIS — C20 Malignant neoplasm of rectum: Secondary | ICD-10-CM

## 2018-04-04 DIAGNOSIS — R630 Anorexia: Secondary | ICD-10-CM

## 2018-04-04 DIAGNOSIS — Z5112 Encounter for antineoplastic immunotherapy: Secondary | ICD-10-CM | POA: Diagnosis present

## 2018-04-04 DIAGNOSIS — R21 Rash and other nonspecific skin eruption: Secondary | ICD-10-CM | POA: Insufficient documentation

## 2018-04-04 DIAGNOSIS — R600 Localized edema: Secondary | ICD-10-CM | POA: Insufficient documentation

## 2018-04-04 DIAGNOSIS — Z5111 Encounter for antineoplastic chemotherapy: Secondary | ICD-10-CM | POA: Diagnosis not present

## 2018-04-04 DIAGNOSIS — Z933 Colostomy status: Secondary | ICD-10-CM

## 2018-04-04 DIAGNOSIS — C7989 Secondary malignant neoplasm of other specified sites: Secondary | ICD-10-CM | POA: Diagnosis not present

## 2018-04-04 DIAGNOSIS — R000 Tachycardia, unspecified: Secondary | ICD-10-CM | POA: Insufficient documentation

## 2018-04-04 DIAGNOSIS — R634 Abnormal weight loss: Secondary | ICD-10-CM | POA: Insufficient documentation

## 2018-04-04 DIAGNOSIS — D500 Iron deficiency anemia secondary to blood loss (chronic): Secondary | ICD-10-CM

## 2018-04-04 DIAGNOSIS — I519 Heart disease, unspecified: Secondary | ICD-10-CM | POA: Diagnosis not present

## 2018-04-04 DIAGNOSIS — K5900 Constipation, unspecified: Secondary | ICD-10-CM

## 2018-04-04 DIAGNOSIS — G589 Mononeuropathy, unspecified: Secondary | ICD-10-CM | POA: Insufficient documentation

## 2018-04-04 DIAGNOSIS — M79604 Pain in right leg: Secondary | ICD-10-CM | POA: Insufficient documentation

## 2018-04-04 DIAGNOSIS — L270 Generalized skin eruption due to drugs and medicaments taken internally: Secondary | ICD-10-CM

## 2018-04-04 DIAGNOSIS — C770 Secondary and unspecified malignant neoplasm of lymph nodes of head, face and neck: Secondary | ICD-10-CM

## 2018-04-04 DIAGNOSIS — D649 Anemia, unspecified: Secondary | ICD-10-CM | POA: Diagnosis not present

## 2018-04-04 DIAGNOSIS — E876 Hypokalemia: Secondary | ICD-10-CM

## 2018-04-04 DIAGNOSIS — C778 Secondary and unspecified malignant neoplasm of lymph nodes of multiple regions: Secondary | ICD-10-CM | POA: Insufficient documentation

## 2018-04-04 DIAGNOSIS — Z87891 Personal history of nicotine dependence: Secondary | ICD-10-CM | POA: Insufficient documentation

## 2018-04-04 DIAGNOSIS — R0609 Other forms of dyspnea: Secondary | ICD-10-CM | POA: Diagnosis not present

## 2018-04-04 DIAGNOSIS — C7800 Secondary malignant neoplasm of unspecified lung: Secondary | ICD-10-CM | POA: Insufficient documentation

## 2018-04-04 DIAGNOSIS — I2699 Other pulmonary embolism without acute cor pulmonale: Secondary | ICD-10-CM | POA: Insufficient documentation

## 2018-04-04 DIAGNOSIS — C787 Secondary malignant neoplasm of liver and intrahepatic bile duct: Secondary | ICD-10-CM | POA: Diagnosis not present

## 2018-04-04 DIAGNOSIS — B370 Candidal stomatitis: Secondary | ICD-10-CM

## 2018-04-04 DIAGNOSIS — Z148 Genetic carrier of other disease: Secondary | ICD-10-CM

## 2018-04-04 DIAGNOSIS — R5383 Other fatigue: Secondary | ICD-10-CM | POA: Insufficient documentation

## 2018-04-04 DIAGNOSIS — Z95828 Presence of other vascular implants and grafts: Secondary | ICD-10-CM

## 2018-04-04 DIAGNOSIS — D473 Essential (hemorrhagic) thrombocythemia: Secondary | ICD-10-CM

## 2018-04-04 DIAGNOSIS — R748 Abnormal levels of other serum enzymes: Secondary | ICD-10-CM

## 2018-04-04 DIAGNOSIS — Z923 Personal history of irradiation: Secondary | ICD-10-CM

## 2018-04-04 DIAGNOSIS — D72829 Elevated white blood cell count, unspecified: Secondary | ICD-10-CM | POA: Diagnosis not present

## 2018-04-04 DIAGNOSIS — T451X5A Adverse effect of antineoplastic and immunosuppressive drugs, initial encounter: Secondary | ICD-10-CM

## 2018-04-04 LAB — COMPREHENSIVE METABOLIC PANEL
ALBUMIN: 3.1 g/dL — AB (ref 3.5–5.0)
ALK PHOS: 730 U/L — AB (ref 38–126)
ALT: 17 U/L (ref 0–44)
ANION GAP: 15 (ref 5–15)
AST: 49 U/L — AB (ref 15–41)
BUN: 12 mg/dL (ref 8–23)
CALCIUM: 9.2 mg/dL (ref 8.9–10.3)
CO2: 20 mmol/L — AB (ref 22–32)
Chloride: 101 mmol/L (ref 98–111)
Creatinine, Ser: 0.79 mg/dL (ref 0.61–1.24)
GFR calc Af Amer: >60 mL/min (ref >60)
GFR calc non Af Amer: >60 mL/min (ref >60)
GLUCOSE: 142 mg/dL — AB (ref 70–99)
Potassium: 3.8 mmol/L (ref 3.5–5.1)
SODIUM: 136 mmol/L (ref 135–145)
Total Bilirubin: 0.6 mg/dL (ref 0.3–1.2)
Total Protein: 7.1 g/dL (ref 6.5–8.1)

## 2018-04-04 LAB — CBC WITH DIFFERENTIAL/PLATELET
BASOS PCT: 0 %
Basophils Absolute: 0.1 10*3/uL (ref 0.0–0.1)
Eosinophils Absolute: 0 10*3/uL (ref 0.0–0.5)
Eosinophils Relative: 0 %
HEMATOCRIT: 31.8 % — AB (ref 38.4–49.9)
Hemoglobin: 10.1 g/dL — ABNORMAL LOW (ref 13.0–17.1)
Lymphocytes Relative: 4 %
Lymphs Abs: 1.1 10*3/uL (ref 0.9–3.3)
MCH: 31.8 pg (ref 27.2–33.4)
MCHC: 31.8 g/dL — AB (ref 32.0–36.0)
MCV: 100 fL — ABNORMAL HIGH (ref 79.3–98.0)
MONO ABS: 3 10*3/uL — AB (ref 0.1–0.9)
MONOS PCT: 11 %
NEUTROS ABS: 22.1 10*3/uL — AB (ref 1.5–6.5)
Neutrophils Relative %: 85 %
Platelets: 441 10*3/uL — ABNORMAL HIGH (ref 140–400)
RBC: 3.18 MIL/uL — ABNORMAL LOW (ref 4.20–5.82)
RDW: 16.7 % — AB (ref 11.0–14.6)
WBC: 26.3 10*3/uL — ABNORMAL HIGH (ref 4.0–10.3)

## 2018-04-04 LAB — MAGNESIUM: Magnesium: 1.6 mg/dL — ABNORMAL LOW (ref 1.7–2.4)

## 2018-04-04 MED ORDER — OXALIPLATIN CHEMO INJECTION 100 MG/20ML
60.0000 mg/m2 | Freq: Once | INTRAVENOUS | Status: AC
  Administered 2018-04-04: 100 mg via INTRAVENOUS
  Filled 2018-04-04: qty 20

## 2018-04-04 MED ORDER — SODIUM CHLORIDE 0.9 % IV SOLN
Freq: Once | INTRAVENOUS | Status: AC
  Administered 2018-04-04: 11:00:00 via INTRAVENOUS
  Filled 2018-04-04: qty 5

## 2018-04-04 MED ORDER — IRINOTECAN HCL CHEMO INJECTION 100 MG/5ML
130.0000 mg/m2 | Freq: Once | INTRAVENOUS | Status: AC
  Administered 2018-04-04: 220 mg via INTRAVENOUS
  Filled 2018-04-04: qty 11

## 2018-04-04 MED ORDER — SODIUM CHLORIDE 0.9 % IV SOLN
Freq: Once | INTRAVENOUS | Status: AC
  Administered 2018-04-04: 11:00:00 via INTRAVENOUS
  Filled 2018-04-04: qty 250

## 2018-04-04 MED ORDER — SODIUM CHLORIDE 0.9% FLUSH
10.0000 mL | Freq: Once | INTRAVENOUS | Status: AC
  Administered 2018-04-04: 10 mL
  Filled 2018-04-04: qty 10

## 2018-04-04 MED ORDER — DEXTROSE 5 % IV SOLN
INTRAVENOUS | Status: DC
  Administered 2018-04-04: 13:00:00 via INTRAVENOUS
  Filled 2018-04-04: qty 250

## 2018-04-04 MED ORDER — PALONOSETRON HCL INJECTION 0.25 MG/5ML
INTRAVENOUS | Status: AC
  Filled 2018-04-04: qty 5

## 2018-04-04 MED ORDER — LEUCOVORIN CALCIUM INJECTION 350 MG
400.0000 mg/m2 | Freq: Once | INTRAVENOUS | Status: AC
  Administered 2018-04-04: 680 mg via INTRAVENOUS
  Filled 2018-04-04: qty 34

## 2018-04-04 MED ORDER — SODIUM CHLORIDE 0.9 % IV SOLN
6.0000 mg/kg | Freq: Once | INTRAVENOUS | Status: AC
  Administered 2018-04-04: 340 mg via INTRAVENOUS
  Filled 2018-04-04: qty 17

## 2018-04-04 MED ORDER — PALONOSETRON HCL INJECTION 0.25 MG/5ML
0.2500 mg | Freq: Once | INTRAVENOUS | Status: AC
  Administered 2018-04-04: 0.25 mg via INTRAVENOUS

## 2018-04-04 MED ORDER — ATROPINE SULFATE 1 MG/ML IJ SOLN: 0.5000 mg | Freq: Once | INTRAMUSCULAR | Status: DC | PRN

## 2018-04-04 MED ORDER — SODIUM CHLORIDE 0.9 % IV SOLN
2000.0000 mg/m2 | INTRAVENOUS | Status: DC
  Administered 2018-04-04: 3400 mg via INTRAVENOUS
  Filled 2018-04-04: qty 68

## 2018-04-04 NOTE — Telephone Encounter (Signed)
Scheduled appt per 8/8 los - pt aware - no print out wanted per patient - my chart active./

## 2018-04-04 NOTE — Patient Instructions (Signed)
[place] Discharge Instructions for Patients Receiving Chemotherapy  Today you received the following chemotherapy agents:  Vectibix, Irinotecan, Oxaliplatin, Leucovorin, and 5FU.  To help prevent nausea and vomiting after your treatment, we encourage you to take your nausea medication as directed.   If you develop nausea and vomiting that is not controlled by your nausea medication, call the clinic.   BELOW ARE SYMPTOMS THAT SHOULD BE REPORTED IMMEDIATELY:  *FEVER GREATER THAN 100.5 F  *CHILLS WITH OR WITHOUT FEVER  NAUSEA AND VOMITING THAT IS NOT CONTROLLED WITH YOUR NAUSEA MEDICATION  *UNUSUAL SHORTNESS OF BREATH  *UNUSUAL BRUISING OR BLEEDING  TENDERNESS IN MOUTH AND THROAT WITH OR WITHOUT PRESENCE OF ULCERS  *URINARY PROBLEMS  *BOWEL PROBLEMS  UNUSUAL RASH Items with * indicate a potential emergency and should be followed up as soon as possible.  Feel free to call the clinic should you have any questions or concerns. The clinic phone number is [phone].  Please show the [facility] at check-in to the Emergency Department and triage nurse.

## 2018-04-06 ENCOUNTER — Inpatient Hospital Stay: Payer: BLUE CROSS/BLUE SHIELD

## 2018-04-06 VITALS — BP 115/89 | HR 81 | Temp 97.6°F | Resp 18

## 2018-04-06 DIAGNOSIS — C20 Malignant neoplasm of rectum: Secondary | ICD-10-CM

## 2018-04-06 MED ORDER — PEGFILGRASTIM-CBQV 6 MG/0.6ML ~~LOC~~ SOSY
PREFILLED_SYRINGE | SUBCUTANEOUS | Status: AC
  Filled 2018-04-06: qty 0.6

## 2018-04-06 MED ORDER — HEPARIN SOD (PORK) LOCK FLUSH 100 UNIT/ML IV SOLN
500.0000 [IU] | Freq: Once | INTRAVENOUS | Status: AC | PRN
  Administered 2018-04-06: 500 [IU]
  Filled 2018-04-06: qty 5

## 2018-04-06 MED ORDER — SODIUM CHLORIDE 0.9% FLUSH
10.0000 mL | INTRAVENOUS | Status: DC | PRN
  Administered 2018-04-06: 10 mL
  Filled 2018-04-06: qty 10

## 2018-04-06 MED ORDER — PEGFILGRASTIM-CBQV 6 MG/0.6ML ~~LOC~~ SOSY
6.0000 mg | PREFILLED_SYRINGE | Freq: Once | SUBCUTANEOUS | Status: AC
  Administered 2018-04-06: 6 mg via SUBCUTANEOUS

## 2018-04-10 ENCOUNTER — Encounter (HOSPITAL_COMMUNITY): Payer: Self-pay

## 2018-04-10 ENCOUNTER — Encounter: Payer: Self-pay | Admitting: Medical

## 2018-04-10 ENCOUNTER — Ambulatory Visit (HOSPITAL_COMMUNITY)
Admission: RE | Admit: 2018-04-10 | Discharge: 2018-04-10 | Disposition: A | Discharge status: Home / Self Care | Payer: BLUE CROSS/BLUE SHIELD | Source: Ambulatory Visit | Attending: Hematology | Admitting: Hematology

## 2018-04-10 ENCOUNTER — Telehealth: Payer: Self-pay

## 2018-04-10 ENCOUNTER — Other Ambulatory Visit: Payer: Self-pay

## 2018-04-10 ENCOUNTER — Inpatient Hospital Stay (HOSPITAL_BASED_OUTPATIENT_CLINIC_OR_DEPARTMENT_OTHER): Payer: BLUE CROSS/BLUE SHIELD | Admitting: Medical

## 2018-04-10 ENCOUNTER — Inpatient Hospital Stay (HOSPITAL_COMMUNITY)
Admission: EM | Admit: 2018-04-10 | Discharge: 2018-04-13 | DRG: 175 | Disposition: A | Discharge status: Home / Self Care | Payer: BLUE CROSS/BLUE SHIELD | Source: Ambulatory Visit | Attending: Internal Medicine | Admitting: Internal Medicine

## 2018-04-10 VITALS — BP 113/87 | HR 73 | Resp 18 | Ht 72.0 in | Wt 123.8 lb

## 2018-04-10 DIAGNOSIS — Z681 Body mass index (BMI) 19 or less, adult: Secondary | ICD-10-CM

## 2018-04-10 DIAGNOSIS — D6481 Anemia due to antineoplastic chemotherapy: Secondary | ICD-10-CM | POA: Diagnosis present

## 2018-04-10 DIAGNOSIS — C20 Malignant neoplasm of rectum: Secondary | ICD-10-CM

## 2018-04-10 DIAGNOSIS — D649 Anemia, unspecified: Secondary | ICD-10-CM | POA: Diagnosis present

## 2018-04-10 DIAGNOSIS — R21 Rash and other nonspecific skin eruption: Secondary | ICD-10-CM

## 2018-04-10 DIAGNOSIS — C787 Secondary malignant neoplasm of liver and intrahepatic bile duct: Secondary | ICD-10-CM | POA: Insufficient documentation

## 2018-04-10 DIAGNOSIS — I700 Atherosclerosis of aorta: Secondary | ICD-10-CM

## 2018-04-10 DIAGNOSIS — C7800 Secondary malignant neoplasm of unspecified lung: Secondary | ICD-10-CM

## 2018-04-10 DIAGNOSIS — Z8042 Family history of malignant neoplasm of prostate: Secondary | ICD-10-CM

## 2018-04-10 DIAGNOSIS — R000 Tachycardia, unspecified: Secondary | ICD-10-CM | POA: Diagnosis not present

## 2018-04-10 DIAGNOSIS — I82441 Acute embolism and thrombosis of right tibial vein: Secondary | ICD-10-CM | POA: Diagnosis present

## 2018-04-10 DIAGNOSIS — Z933 Colostomy status: Secondary | ICD-10-CM

## 2018-04-10 DIAGNOSIS — D61818 Other pancytopenia: Secondary | ICD-10-CM | POA: Diagnosis not present

## 2018-04-10 DIAGNOSIS — G589 Mononeuropathy, unspecified: Secondary | ICD-10-CM

## 2018-04-10 DIAGNOSIS — R0609 Other forms of dyspnea: Secondary | ICD-10-CM

## 2018-04-10 DIAGNOSIS — M79604 Pain in right leg: Secondary | ICD-10-CM

## 2018-04-10 DIAGNOSIS — Z87891 Personal history of nicotine dependence: Secondary | ICD-10-CM | POA: Diagnosis not present

## 2018-04-10 DIAGNOSIS — Z825 Family history of asthma and other chronic lower respiratory diseases: Secondary | ICD-10-CM

## 2018-04-10 DIAGNOSIS — C7801 Secondary malignant neoplasm of right lung: Secondary | ICD-10-CM | POA: Diagnosis present

## 2018-04-10 DIAGNOSIS — D696 Thrombocytopenia, unspecified: Secondary | ICD-10-CM | POA: Diagnosis present

## 2018-04-10 DIAGNOSIS — D630 Anemia in neoplastic disease: Secondary | ICD-10-CM | POA: Diagnosis present

## 2018-04-10 DIAGNOSIS — I82411 Acute embolism and thrombosis of right femoral vein: Secondary | ICD-10-CM | POA: Diagnosis present

## 2018-04-10 DIAGNOSIS — D6959 Other secondary thrombocytopenia: Secondary | ICD-10-CM | POA: Diagnosis present

## 2018-04-10 DIAGNOSIS — K4090 Unilateral inguinal hernia, without obstruction or gangrene, not specified as recurrent: Secondary | ICD-10-CM | POA: Insufficient documentation

## 2018-04-10 DIAGNOSIS — C7989 Secondary malignant neoplasm of other specified sites: Secondary | ICD-10-CM | POA: Diagnosis not present

## 2018-04-10 DIAGNOSIS — I2609 Other pulmonary embolism with acute cor pulmonale: Secondary | ICD-10-CM

## 2018-04-10 DIAGNOSIS — E43 Unspecified severe protein-calorie malnutrition: Secondary | ICD-10-CM | POA: Diagnosis present

## 2018-04-10 DIAGNOSIS — R0902 Hypoxemia: Secondary | ICD-10-CM | POA: Diagnosis present

## 2018-04-10 DIAGNOSIS — C778 Secondary and unspecified malignant neoplasm of lymph nodes of multiple regions: Secondary | ICD-10-CM | POA: Diagnosis not present

## 2018-04-10 DIAGNOSIS — I2699 Other pulmonary embolism without acute cor pulmonale: Secondary | ICD-10-CM

## 2018-04-10 DIAGNOSIS — I519 Heart disease, unspecified: Secondary | ICD-10-CM | POA: Diagnosis not present

## 2018-04-10 DIAGNOSIS — R5383 Other fatigue: Secondary | ICD-10-CM | POA: Diagnosis not present

## 2018-04-10 DIAGNOSIS — T451X5A Adverse effect of antineoplastic and immunosuppressive drugs, initial encounter: Secondary | ICD-10-CM | POA: Diagnosis present

## 2018-04-10 DIAGNOSIS — M7989 Other specified soft tissue disorders: Secondary | ICD-10-CM | POA: Diagnosis not present

## 2018-04-10 DIAGNOSIS — Z800 Family history of malignant neoplasm of digestive organs: Secondary | ICD-10-CM

## 2018-04-10 DIAGNOSIS — G629 Polyneuropathy, unspecified: Secondary | ICD-10-CM | POA: Diagnosis present

## 2018-04-10 DIAGNOSIS — Z823 Family history of stroke: Secondary | ICD-10-CM | POA: Diagnosis not present

## 2018-04-10 DIAGNOSIS — R600 Localized edema: Secondary | ICD-10-CM | POA: Diagnosis not present

## 2018-04-10 DIAGNOSIS — C7802 Secondary malignant neoplasm of left lung: Secondary | ICD-10-CM | POA: Diagnosis present

## 2018-04-10 DIAGNOSIS — I82409 Acute embolism and thrombosis of unspecified deep veins of unspecified lower extremity: Secondary | ICD-10-CM | POA: Diagnosis not present

## 2018-04-10 DIAGNOSIS — Z5111 Encounter for antineoplastic chemotherapy: Secondary | ICD-10-CM | POA: Diagnosis not present

## 2018-04-10 DIAGNOSIS — C779 Secondary and unspecified malignant neoplasm of lymph node, unspecified: Secondary | ICD-10-CM | POA: Diagnosis not present

## 2018-04-10 DIAGNOSIS — Z923 Personal history of irradiation: Secondary | ICD-10-CM

## 2018-04-10 DIAGNOSIS — I361 Nonrheumatic tricuspid (valve) insufficiency: Secondary | ICD-10-CM | POA: Diagnosis not present

## 2018-04-10 DIAGNOSIS — N1330 Unspecified hydronephrosis: Secondary | ICD-10-CM | POA: Diagnosis present

## 2018-04-10 DIAGNOSIS — R748 Abnormal levels of other serum enzymes: Secondary | ICD-10-CM

## 2018-04-10 DIAGNOSIS — R630 Anorexia: Secondary | ICD-10-CM | POA: Diagnosis not present

## 2018-04-10 DIAGNOSIS — R634 Abnormal weight loss: Secondary | ICD-10-CM | POA: Diagnosis not present

## 2018-04-10 DIAGNOSIS — Z5112 Encounter for antineoplastic immunotherapy: Secondary | ICD-10-CM | POA: Diagnosis present

## 2018-04-10 LAB — COMPREHENSIVE METABOLIC PANEL
ALBUMIN: 2.9 g/dL — AB (ref 3.5–5.0)
ALK PHOS: 685 U/L — AB (ref 38–126)
ALT: 35 U/L (ref 0–44)
AST: 33 U/L (ref 15–41)
Anion gap: 12 (ref 5–15)
BUN: 22 mg/dL (ref 8–23)
CHLORIDE: 101 mmol/L (ref 98–111)
CO2: 21 mmol/L — ABNORMAL LOW (ref 22–32)
Calcium: 8.4 mg/dL — ABNORMAL LOW (ref 8.9–10.3)
Creatinine, Ser: 0.89 mg/dL (ref 0.61–1.24)
GFR calc non Af Amer: >60 mL/min (ref >60)
GLUCOSE: 113 mg/dL — AB (ref 70–99)
POTASSIUM: 4.3 mmol/L (ref 3.5–5.1)
SODIUM: 134 mmol/L — AB (ref 135–145)
Total Bilirubin: 0.8 mg/dL (ref 0.3–1.2)
Total Protein: 6.1 g/dL — ABNORMAL LOW (ref 6.5–8.1)

## 2018-04-10 LAB — I-STAT CHEM 8, ED
BUN: 20 mg/dL (ref 8–23)
CALCIUM ION: 1.17 mmol/L (ref 1.15–1.40)
CREATININE: 0.8 mg/dL (ref 0.61–1.24)
Chloride: 99 mmol/L (ref 98–111)
Glucose, Bld: 111 mg/dL — ABNORMAL HIGH (ref 70–99)
HEMATOCRIT: 22 % — AB (ref 39.0–52.0)
HEMOGLOBIN: 7.5 g/dL — AB (ref 13.0–17.0)
Potassium: 4.2 mmol/L (ref 3.5–5.1)
SODIUM: 131 mmol/L — AB (ref 135–145)
TCO2: 19 mmol/L — AB (ref 22–32)

## 2018-04-10 LAB — CBC WITH DIFFERENTIAL/PLATELET
BASOS ABS: 0 10*3/uL (ref 0.0–0.1)
Basophils Relative: 0 %
EOS ABS: 0 10*3/uL (ref 0.0–0.7)
Eosinophils Relative: 0 %
HCT: 23.6 % — ABNORMAL LOW (ref 39.0–52.0)
HEMOGLOBIN: 7.7 g/dL — AB (ref 13.0–17.0)
Lymphocytes Relative: 3 %
Lymphs Abs: 0.4 10*3/uL — ABNORMAL LOW (ref 0.7–4.0)
MCH: 32.1 pg (ref 26.0–34.0)
MCHC: 32.6 g/dL (ref 30.0–36.0)
MCV: 98.3 fL (ref 78.0–100.0)
MONO ABS: 0.3 10*3/uL (ref 0.1–1.0)
Monocytes Relative: 2 %
NEUTROS ABS: 12.4 10*3/uL — AB (ref 1.7–7.7)
Neutrophils Relative %: 95 %
PLATELETS: 72 10*3/uL — AB (ref 150–400)
RBC: 2.4 MIL/uL — ABNORMAL LOW (ref 4.22–5.81)
RDW: 17 % — AB (ref 11.5–15.5)
WBC: 13.1 10*3/uL — ABNORMAL HIGH (ref 4.0–10.5)

## 2018-04-10 LAB — TROPONIN I: TROPONIN I: 0.03 ng/mL — AB (ref <0.03)

## 2018-04-10 MED ORDER — SODIUM CHLORIDE 0.9 % IV SOLN
INTRAVENOUS | Status: AC
  Administered 2018-04-10: 21:00:00 via INTRAVENOUS

## 2018-04-10 MED ORDER — ONDANSETRON HCL 4 MG/2ML IJ SOLN: 4.0000 mg | Freq: Four times a day (QID) | INTRAMUSCULAR | Status: DC | PRN

## 2018-04-10 MED ORDER — IOHEXOL 300 MG/ML  SOLN
100.0000 mL | Freq: Once | INTRAMUSCULAR | Status: AC | PRN
  Administered 2018-04-10: 100 mL via INTRAVENOUS

## 2018-04-10 MED ORDER — SODIUM CHLORIDE 0.9% FLUSH
10.0000 mL | INTRAVENOUS | Status: DC | PRN
  Administered 2018-04-12 – 2018-04-13 (×2): 10 mL
  Filled 2018-04-10 (×2): qty 40

## 2018-04-10 MED ORDER — HEPARIN BOLUS VIA INFUSION: 60.0000 [IU]/kg | Freq: Once | INTRAVENOUS | Status: DC

## 2018-04-10 MED ORDER — NYSTATIN 100000 UNIT/ML MT SUSP
5.0000 mL | Freq: Four times a day (QID) | OROMUCOSAL | Status: DC
  Administered 2018-04-10 – 2018-04-13 (×10): 500000 [IU] via ORAL
  Filled 2018-04-10 (×10): qty 5

## 2018-04-10 MED ORDER — HEPARIN SOD (PORK) LOCK FLUSH 100 UNIT/ML IV SOLN
INTRAVENOUS | Status: AC
  Administered 2018-04-10: 500 [IU] via INTRAVENOUS
  Filled 2018-04-10: qty 5

## 2018-04-10 MED ORDER — HEPARIN (PORCINE) IN NACL 100-0.45 UNIT/ML-% IJ SOLN
1200.0000 [IU]/h | INTRAMUSCULAR | Status: DC
  Administered 2018-04-11 – 2018-04-12 (×2): 1000 [IU]/h via INTRAVENOUS
  Filled 2018-04-10 (×2): qty 250

## 2018-04-10 MED ORDER — SODIUM CHLORIDE 0.9 % IV BOLUS
1000.0000 mL | Freq: Once | INTRAVENOUS | Status: AC
  Administered 2018-04-10: 1000 mL via INTRAVENOUS

## 2018-04-10 MED ORDER — ACETAMINOPHEN 650 MG RE SUPP: 650.0000 mg | Freq: Four times a day (QID) | RECTAL | Status: DC | PRN

## 2018-04-10 MED ORDER — ENSURE ENLIVE PO LIQD
237.0000 mL | Freq: Two times a day (BID) | ORAL | Status: DC
  Administered 2018-04-11 – 2018-04-12 (×4): 237 mL via ORAL

## 2018-04-10 MED ORDER — ENOXAPARIN SODIUM 60 MG/0.6ML ~~LOC~~ SOLN
55.0000 mg | SUBCUTANEOUS | Status: AC
  Administered 2018-04-10: 55 mg via SUBCUTANEOUS
  Filled 2018-04-10: qty 0.55

## 2018-04-10 MED ORDER — HEPARIN SOD (PORK) LOCK FLUSH 100 UNIT/ML IV SOLN
500.0000 [IU] | Freq: Once | INTRAVENOUS | Status: AC
  Administered 2018-04-10: 500 [IU] via INTRAVENOUS

## 2018-04-10 MED ORDER — IOPAMIDOL (ISOVUE-370) INJECTION 76%: 100.0000 mL | Freq: Once | INTRAVENOUS | Status: DC | PRN

## 2018-04-10 MED ORDER — MAGNESIUM OXIDE 400 (241.3 MG) MG PO TABS
400.0000 mg | ORAL_TABLET | Freq: Two times a day (BID) | ORAL | Status: DC
  Administered 2018-04-10 – 2018-04-13 (×6): 400 mg via ORAL
  Filled 2018-04-10 (×6): qty 1

## 2018-04-10 MED ORDER — ONDANSETRON HCL 4 MG PO TABS: 4.0000 mg | ORAL_TABLET | Freq: Four times a day (QID) | ORAL | Status: DC | PRN

## 2018-04-10 MED ORDER — PANTOPRAZOLE SODIUM 40 MG PO TBEC
40.0000 mg | DELAYED_RELEASE_TABLET | Freq: Every day | ORAL | Status: DC
  Administered 2018-04-10 – 2018-04-13 (×4): 40 mg via ORAL
  Filled 2018-04-10 (×4): qty 1

## 2018-04-10 MED ORDER — ACETAMINOPHEN 325 MG PO TABS: 650.0000 mg | ORAL_TABLET | Freq: Four times a day (QID) | ORAL | Status: DC | PRN

## 2018-04-10 NOTE — Progress Notes (Signed)
[facility] for heparin IV Indication: pulmonary embolus  Allergies  Allergen Reactions  . Sulfa Antibiotics Nausea Only and Other (See Comments)    Reaction may years ago, pt thinks nausea only.     Patient Measurements: Height: 6' (182.9 cm) Weight: 123 lb 12.8 oz (56.2 kg) IBW/kg (Calculated) : 77.6 Heparin Dosing Weight: TBW  Vital Signs: Temp: 98 F (36.7 C) (08/14 1609) Temp Source: Oral (08/14 1609) BP: 137/87 (08/14 2030) Pulse Rate: 72 (08/14 2030)  Labs: Recent Labs    04/10/18 1705 04/10/18 1728 04/10/18 1937  HGB 7.7* 7.5*  --   HCT 23.6* 22.0*  --   PLT 72*  --   --   CREATININE 0.89 0.80  --   TROPONINI  --   --  0.03*    Estimated Creatinine Clearance: 76.1 mL/min (by C-G formula based on SCr of 0.8 mg/dL).   Medical History: Past Medical History:  Diagnosis Date  . Allergy   . Cancer ([place]) 10/09/2016   rectal adenocarcinoma  . Inguinal hernia     Medications:  Facility-Administered Medications Prior to Admission  Medication Dose Route Frequency Provider Last Rate Last Dose  . 0.9 %  sodium chloride infusion  500 mL Intravenous Continuous Pyrtle, Lajuan Lines, MD      . 0.9 %  sodium chloride infusion  500 mL Intravenous Once Pyrtle, Lajuan Lines, MD       Medications Prior to Admission  Medication Sig Dispense Refill Last Dose  . Cannabinoids (THC FREE PO) Inhale into the lungs. THC Oil via vape as needed for sleep   04/09/2018 at Unknown time  . clindamycin (CLINDAGEL) 1 % gel Apply topically 2 (two) times daily. 60 g 1 04/10/2018 at Unknown time  . ibuprofen (ADVIL,MOTRIN) 200 MG tablet Take 200-600 mg by mouth daily as needed for moderate pain.   Past Week at Unknown time  . magnesium oxide (MAG-OX) 400 (241.3 Mg) MG tablet Take 1 tablet (400 mg total) by mouth 2 (two) times daily. 60 tablet 1 Past Week at Unknown time  . Multiple Vitamin (MULTIVITAMIN WITH MINERALS) TABS tablet Take 1 tablet by mouth every other day.     Past Week at Unknown time  . nystatin (MYCOSTATIN) 100000 UNIT/ML suspension Take 5 mLs (500,000 Units total) by mouth 4 (four) times daily. 473 mL 0 04/09/2018  . omeprazole (PRILOSEC) 40 MG capsule Take 1 capsule (40 mg total) by mouth daily. 30 capsule 2 04/09/2018 at Unknown time  . potassium chloride SA (K-DUR,KLOR-CON) 20 MEQ tablet Take 1 tablet (20 mEq total) by mouth 2 (two) times daily. 60 tablet 1 Past Week at Unknown time  . dexamethasone (DECADRON) 4 MG tablet Take 1 tablet (4 mg total) by mouth daily. Start the day after chemo for 3-5 days. (Patient not taking: Reported on 04/10/2018) 10 tablet 2 Not Taking at Unknown time  . ondansetron (ZOFRAN) 8 MG tablet Take 1 tablet (8 mg total) by mouth 2 (two) times daily as needed for refractory nausea / vomiting. Start on day 3 after chemotherapy. (Patient not taking: Reported on 04/04/2018) 30 tablet 1 Not Taking at Unknown time  . prochlorperazine (COMPAZINE) 10 MG tablet Take 1 tablet (10 mg total) by mouth every 6 (six) hours as needed (NAUSEA). (Patient not taking: Reported on 04/04/2018) 30 tablet 1 Not Taking at Unknown time   Scheduled:  . [START ON 04/11/2018] feeding supplement (ENSURE ENLIVE)  237 mL Oral BID BM  . magnesium  oxide  400 mg Oral BID  . nystatin  5 mL Oral QID  . pantoprazole  40 mg Oral Daily   PRN: acetaminophen **OR** acetaminophen, ondansetron **OR** ondansetron (ZOFRAN) IV  Assessment: 52 yoM with PMH metastatic colon cancer, rectal adenocarcinoma with last chemo given 04/04/18, and neuropathy. Found to have new PE. Given Lovenox 1 mg/kg x 1 in ED, but oncologist would prefer patient on heparin given low Hgb/Plt from recent chemo.   Baseline INR, aPTT: pending  Prior anticoagulation: none PTA; Lovenox 55 mg given at 1730 on 8/14  Significant events:  Today, 04/10/2018:  CBC: Hgb 7.5 from chemo/IDA, Plt low as well  No bleeding or infusion issues per nursing  CrCl: 76 ml/min  Goal of Therapy: Heparin  level 0.3-0.7 units/ml Monitor platelets by anticoagulation protocol: Yes  Plan:  Heparin 1000 units/hr IV infusion to start tomorrow at 0600 (12 hrs after Lovenox dose), no bolus  Check heparin level 6 hrs after start  Daily CBC, daily heparin level once stable  Monitor for signs of bleeding or thrombosis   Reuel Boom, PharmD, BCPS [phone] 04/10/2018, 9:12 PM

## 2018-04-10 NOTE — ED Notes (Signed)
ED TO INPATIENT HANDOFF REPORT  Name/Age/Gender David Clarke 62 y.o. male  Code Status Code Status History    Date Active Date Inactive Code Status Order ID Comments User Context   02/21/2017 1429 02/25/2017 1455 Full Code [id number]  Michael Boston, MD Inpatient   12/03/2016 1524 12/09/2016 1712 Full Code [id number]  Debbe Odea, MD ED    Advance Directive Documentation     Most Recent Value  Type of Advance Directive  Healthcare Power of Attorney  Pre-existing out of facility DNR order (yellow form or pink MOST form)  -  "MOST" Form in Place?  -      Home/SNF/Other Home  Chief Complaint pulmonary embolism  Level of Care/Admitting Diagnosis ED Disposition    ED Disposition Condition [facility] Area: [place] [[id number]]  Level of Care: Telemetry [5]  Admit to tele based on following criteria: Monitor for Ischemic changes  Diagnosis: Pulmonary embolism ([place]) [160109]  Admitting Physician: Toy Baker [[id number]]  Attending Physician: Toy Baker [[id number]]  Estimated length of stay: 3 - 4 days  Certification:: I certify this patient will need inpatient services for at least 2 midnights  PT Class (Do Not Modify): Inpatient [101]  PT Acc Code (Do Not Modify): Private [1]       Medical History Past Medical History:  Diagnosis Date  . Allergy   . Cancer ([place]) 10/09/2016   rectal adenocarcinoma  . Inguinal hernia     Allergies Allergies  Allergen Reactions  . Sulfa Antibiotics Nausea Only and Other (See Comments)    Reaction may years ago, pt thinks nausea only.     IV Location/Drains/Wounds Patient Lines/Drains/Airways Status   Active Line/Drains/Airways    Name:   Placement date:   Placement time:   Site:   Days:   Implanted Port 11/13/17 Right Chest   11/13/17    1504    Chest   148   Arterial Line 03/02/17 Radial   03/02/17    1308    Radial   404   Closed System Drain Right Abdomen Bulb (JP) 19 Fr.   02/21/17    1124     Abdomen   413   Colostomy LUQ   02/21/17    1145    LUQ   413   Incision (Closed) 02/21/17 Abdomen Other (Comment)   02/21/17    1223     413   Incision (Closed) 10/29/17 Neck Left   10/29/17    1459     163   Incision - 4 Ports Abdomen Right;Lateral Right Mid Left   02/21/17    0945     413          Labs/Imaging Results for orders placed or performed during the hospital encounter of 04/10/18 (from the past 48 hour(s))  CBC with Differential/Platelet     Status: Abnormal   Collection Time: 04/10/18  5:05 PM  Result Value Ref Range   WBC 13.1 (H) 4.0 - 10.5 K/uL   RBC 2.40 (L) 4.22 - 5.81 MIL/uL   Hemoglobin 7.7 (L) 13.0 - 17.0 g/dL   HCT 23.6 (L) 39.0 - 52.0 %   MCV 98.3 78.0 - 100.0 fL   MCH 32.1 26.0 - 34.0 pg   MCHC 32.6 30.0 - 36.0 g/dL   RDW 17.0 (H) 11.5 - 15.5 %   Platelets 72 (L) 150 - 400 K/uL    Comment: REPEATED TO VERIFY SPECIMEN CHECKED FOR CLOTS PLATELET COUNT  CONFIRMED BY SMEAR    Neutrophils Relative % 95 %   Lymphocytes Relative 3 %   Monocytes Relative 2 %   Eosinophils Relative 0 %   Basophils Relative 0 %   Neutro Abs 12.4 (H) 1.7 - 7.7 K/uL   Lymphs Abs 0.4 (L) 0.7 - 4.0 K/uL   Monocytes Absolute 0.3 0.1 - 1.0 K/uL   Eosinophils Absolute 0.0 0.0 - 0.7 K/uL   Basophils Absolute 0.0 0.0 - 0.1 K/uL   Smear Review MORPHOLOGY UNREMARKABLE     Comment: PLATELET COUNT CONFIRMED BY SMEAR Performed at [facility] [address] [place], [place] [address]   Comprehensive metabolic panel     Status: Abnormal   Collection Time: 04/10/18  5:05 PM  Result Value Ref Range   Sodium 134 (L) 135 - 145 mmol/L   Potassium 4.3 3.5 - 5.1 mmol/L   Chloride 101 98 - 111 mmol/L   CO2 21 (L) 22 - 32 mmol/L   Glucose, Bld 113 (H) 70 - 99 mg/dL   BUN 22 8 - 23 mg/dL   Creatinine, Ser 0.89 0.61 - 1.24 mg/dL   Calcium 8.4 (L) 8.9 - 10.3 mg/dL   Total Protein 6.1 (L) 6.5 - 8.1 g/dL   Albumin 2.9 (L) 3.5 - 5.0 g/dL   AST 33 15 - 41 U/L   ALT 35 0 -  44 U/L   Alkaline Phosphatase 685 (H) 38 - 126 U/L   Total Bilirubin 0.8 0.3 - 1.2 mg/dL   GFR calc non Af Amer >60 >60 mL/min   GFR calc Af Amer >60 >60 mL/min    Comment: (NOTE) The eGFR has been calculated using the CKD EPI equation. This calculation has not been validated in all clinical situations. eGFR's persistently <60 mL/min signify possible Chronic Kidney Disease.    Anion gap 12 5 - 15    Comment: Performed at [facility], [place] [address] [place],  [address]  I-stat chem 8, ed     Status: Abnormal   Collection Time: 04/10/18  5:28 PM  Result Value Ref Range   Sodium 131 (L) 135 - 145 mmol/L   Potassium 4.2 3.5 - 5.1 mmol/L   Chloride 99 98 - 111 mmol/L   BUN 20 8 - 23 mg/dL   Creatinine, Ser 0.80 0.61 - 1.24 mg/dL   Glucose, Bld 111 (H) 70 - 99 mg/dL   Calcium, Ion 1.17 1.15 - 1.40 mmol/L   TCO2 19 (L) 22 - 32 mmol/L   Hemoglobin 7.5 (L) 13.0 - 17.0 g/dL   HCT 22.0 (L) 39.0 - 52.0 %   Ct Chest W Contrast  Result Date: 04/10/2018 CLINICAL DATA:  Stage IV rectal adenocarcinoma, presenting for restaging with ongoing chemotherapy. EXAM: CT CHEST, ABDOMEN, AND PELVIS WITH CONTRAST TECHNIQUE: Multidetector CT imaging of the chest, abdomen and pelvis was performed following the standard protocol during bolus administration of intravenous contrast. CONTRAST:  129m OMNIPAQUE IOHEXOL 300 MG/ML  SOLN COMPARISON:  01/08/2018 CT chest, abdomen and pelvis. FINDINGS: CT CHEST FINDINGS Cardiovascular: Normal heart size. No significant pericardial effusion/thickening. Right internal jugular MediPort terminates at the cavoatrial junction. Great vessels are normal in course and caliber. There is an acute pulmonary embolus to the right lower lobe involving lobar, segmental and subsegmental branches. Elevated RV/LV ratio 1.08. Mediastinum/Nodes: No discrete thyroid nodules. Unremarkable esophagus. Mildly enlarged 1.3 cm left supraclavicular node (series 2/image 6) is  decreased from 2.5 cm. No axillary adenopathy. No mediastinal or  hilar adenopathy. Lungs/Pleura: No pneumothorax. No pleural effusion. Numerous solid pulmonary masses and nodules throughout both lungs (greater than 30), significantly increased in size and number. For example, a 3.3 cm right middle lobe mass (series 9/image 129), increased from 1.8 cm. Basilar left lower lobe 2.3 cm nodule (series 9/image 136) is increased from 0.8 cm. Left upper lobe 1.9 cm nodule (series 9/image 49) is increased from 0.8 cm. Musculoskeletal:  No discrete focal osseous lesions in the chest. CT ABDOMEN PELVIS FINDINGS Hepatobiliary: Liver is enlarged by numerous (greater than 10) bulky heterogeneous hypoenhancing masses, significantly increased in size and number. For example, an 8.0 x 7.5 cm lateral segment left liver lobe mass (series 2/image 71), increased from 2.8 x 1.8 cm. Inferior right liver lobe 7.8 x 4.7 cm mass (series 2/image 74), increased from 2.1 x 2.1 cm. Right liver dome 9.7 x 8.3 cm mass (series 2/image 56), increased from 1.1 x 1.0 cm. Normal gallbladder with no radiopaque cholelithiasis. No biliary ductal dilatation. Pancreas: Normal, with no mass or duct dilation. Spleen: Normal size. No mass. Adrenals/Urinary Tract: No discrete adrenal nodules. Mild-to-moderate right hydroureteronephrosis to the level of mid right lumbar ureter is mildly increased. No left hydronephrosis. Contrast nephrograms are symmetric and within normal limits. Subcentimeter hypodense renal cortical lesions in the upper right kidney are too small to characterize and are stable. No new renal lesions. Bladder is collapsed with stable chronic mild diffuse bladder wall thickening and perivesical fat haziness. Stomach/Bowel: Normal non-distended stomach. Moderate right inguinal hernia containing multiple right pelvic small bowel loops, unchanged. No small bowel dilatation or wall thickening. Oral contrast transits to the colon. Appendix appears  normal. Stable postsurgical changes from end sigmoid colostomy in the ventral left abdominal wall with no wall thickening or acute pericolonic fat stranding in the remnant large-bowel. Stable postsurgical and post treatment changes from abdominoperineal resection, with mildly heterogeneous soft tissue density in the presacral space measuring up to 3.4 cm thickness (series 2/image 113), stable. Vascular/Lymphatic: Atherosclerotic nonaneurysmal abdominal aorta. Patent portal, hepatic, splenic and renal veins. Aortocaval adenopathy measures up to 3.5 cm (series 2/image 77), decreased from 4.3 cm. No new adenopathy in the abdomen or pelvis. Reproductive: Stable top-normal size prostate. Other: No pneumoperitoneum. No ascites. No focal fluid collections. Heterogeneous hypodense right psoas muscle 11.2 x 10.6 cm mass (series 2/image 80), previously 11.8 x 11.1 cm, not appreciably changed. Musculoskeletal: No discrete osseous lesions. Mild lumbar spondylosis. IMPRESSION: 1. Acute pulmonary embolism in the right lower lobe, extending proximally to the lobar branch. 2. Positive for acute PE with CT evidence of right heart strain (RV/LV Ratio = 1.1) consistent with at least submassive (intermediate risk) PE. The presence of right heart strain has been associated with an increased risk of morbidity and mortality. 3. Overall substantial progression of metastatic disease, although there are mixed changes as detailed below. 4. Pulmonary metastases have significantly increased in size and number. 5. Liver metastases have significantly increased in size and number. 6. Left supraclavicular and retroperitoneal adenopathy is decreased. 7. Large right psoas muscle metastasis is stable. 8. Mild to moderate right hydroureteronephrosis is mildly increased, although there is no evidence of a delayed right contrast nephrogram, suggesting that the degree of ureteral obstruction is low-grade at this time. 9. Stable right inguinal hernia  containing small bowel loops without bowel complication. 10. Stable postsurgical/post-treatment changes in the presacral space from APR. 11.  Aortic Atherosclerosis (ICD10-I70.0). Critical Value/emergent results were called by telephone at the time of interpretation on 04/10/2018 at  2:43 pm to Dr. Truitt Merle , who verbally acknowledged these results. Electronically Signed   By: Ilona Sorrel M.D.   On: 04/10/2018 14:47   Ct Abdomen Pelvis W Contrast  Result Date: 04/10/2018 CLINICAL DATA:  Stage IV rectal adenocarcinoma, presenting for restaging with ongoing chemotherapy. EXAM: CT CHEST, ABDOMEN, AND PELVIS WITH CONTRAST TECHNIQUE: Multidetector CT imaging of the chest, abdomen and pelvis was performed following the standard protocol during bolus administration of intravenous contrast. CONTRAST:  145m OMNIPAQUE IOHEXOL 300 MG/ML  SOLN COMPARISON:  01/08/2018 CT chest, abdomen and pelvis. FINDINGS: CT CHEST FINDINGS Cardiovascular: Normal heart size. No significant pericardial effusion/thickening. Right internal jugular MediPort terminates at the cavoatrial junction. Great vessels are normal in course and caliber. There is an acute pulmonary embolus to the right lower lobe involving lobar, segmental and subsegmental branches. Elevated RV/LV ratio 1.08. Mediastinum/Nodes: No discrete thyroid nodules. Unremarkable esophagus. Mildly enlarged 1.3 cm left supraclavicular node (series 2/image 6) is decreased from 2.5 cm. No axillary adenopathy. No mediastinal or hilar adenopathy. Lungs/Pleura: No pneumothorax. No pleural effusion. Numerous solid pulmonary masses and nodules throughout both lungs (greater than 30), significantly increased in size and number. For example, a 3.3 cm right middle lobe mass (series 9/image 129), increased from 1.8 cm. Basilar left lower lobe 2.3 cm nodule (series 9/image 136) is increased from 0.8 cm. Left upper lobe 1.9 cm nodule (series 9/image 49) is increased from 0.8 cm. Musculoskeletal:   No discrete focal osseous lesions in the chest. CT ABDOMEN PELVIS FINDINGS Hepatobiliary: Liver is enlarged by numerous (greater than 10) bulky heterogeneous hypoenhancing masses, significantly increased in size and number. For example, an 8.0 x 7.5 cm lateral segment left liver lobe mass (series 2/image 71), increased from 2.8 x 1.8 cm. Inferior right liver lobe 7.8 x 4.7 cm mass (series 2/image 74), increased from 2.1 x 2.1 cm. Right liver dome 9.7 x 8.3 cm mass (series 2/image 56), increased from 1.1 x 1.0 cm. Normal gallbladder with no radiopaque cholelithiasis. No biliary ductal dilatation. Pancreas: Normal, with no mass or duct dilation. Spleen: Normal size. No mass. Adrenals/Urinary Tract: No discrete adrenal nodules. Mild-to-moderate right hydroureteronephrosis to the level of mid right lumbar ureter is mildly increased. No left hydronephrosis. Contrast nephrograms are symmetric and within normal limits. Subcentimeter hypodense renal cortical lesions in the upper right kidney are too small to characterize and are stable. No new renal lesions. Bladder is collapsed with stable chronic mild diffuse bladder wall thickening and perivesical fat haziness. Stomach/Bowel: Normal non-distended stomach. Moderate right inguinal hernia containing multiple right pelvic small bowel loops, unchanged. No small bowel dilatation or wall thickening. Oral contrast transits to the colon. Appendix appears normal. Stable postsurgical changes from end sigmoid colostomy in the ventral left abdominal wall with no wall thickening or acute pericolonic fat stranding in the remnant large-bowel. Stable postsurgical and post treatment changes from abdominoperineal resection, with mildly heterogeneous soft tissue density in the presacral space measuring up to 3.4 cm thickness (series 2/image 113), stable. Vascular/Lymphatic: Atherosclerotic nonaneurysmal abdominal aorta. Patent portal, hepatic, splenic and renal veins. Aortocaval adenopathy  measures up to 3.5 cm (series 2/image 77), decreased from 4.3 cm. No new adenopathy in the abdomen or pelvis. Reproductive: Stable top-normal size prostate. Other: No pneumoperitoneum. No ascites. No focal fluid collections. Heterogeneous hypodense right psoas muscle 11.2 x 10.6 cm mass (series 2/image 80), previously 11.8 x 11.1 cm, not appreciably changed. Musculoskeletal: No discrete osseous lesions. Mild lumbar spondylosis. IMPRESSION: 1. Acute pulmonary embolism in the  right lower lobe, extending proximally to the lobar branch. 2. Positive for acute PE with CT evidence of right heart strain (RV/LV Ratio = 1.1) consistent with at least submassive (intermediate risk) PE. The presence of right heart strain has been associated with an increased risk of morbidity and mortality. 3. Overall substantial progression of metastatic disease, although there are mixed changes as detailed below. 4. Pulmonary metastases have significantly increased in size and number. 5. Liver metastases have significantly increased in size and number. 6. Left supraclavicular and retroperitoneal adenopathy is decreased. 7. Large right psoas muscle metastasis is stable. 8. Mild to moderate right hydroureteronephrosis is mildly increased, although there is no evidence of a delayed right contrast nephrogram, suggesting that the degree of ureteral obstruction is low-grade at this time. 9. Stable right inguinal hernia containing small bowel loops without bowel complication. 10. Stable postsurgical/post-treatment changes in the presacral space from APR. 11.  Aortic Atherosclerosis (ICD10-I70.0). Critical Value/emergent results were called by telephone at the time of interpretation on 04/10/2018 at 2:43 pm to Dr. Truitt Merle , who verbally acknowledged these results. Electronically Signed   By: Ilona Sorrel M.D.   On: 04/10/2018 14:47    Pending Labs Unresulted Labs (From admission, onward)    Start     Ordered   04/10/18 1920  Troponin I (q 6hr x  3)  Now then every 6 hours,   R     04/10/18 1919   04/10/18 1908  Type and screen  Once,   R     04/10/18 1907   Signed and Held  HIV antibody (Routine Testing)  Tomorrow morning,   R     Signed and Held   Signed and Held  Magnesium  Tomorrow morning,   R    Comments:  Call MD if <1.5    Signed and Held   Signed and Held  Phosphorus  Tomorrow morning,   R     Signed and Held   Signed and Held  TSH  Once,   R    Comments:  Cancel if already done within 1 month and notify MD    Signed and Held   Signed and Held  Comprehensive metabolic panel  Once,   R    Comments:  Cal MD for K<3.5 or >5.0    Signed and Held   Signed and Held  CBC  Once,   R    Comments:  Call for hg <8.0    Signed and Held          Vitals/Pain Today's Vitals   04/10/18 1851 04/10/18 1900 04/10/18 1930 04/10/18 1944  BP: 134/86 (!) 108/91 (!) 119/102   Pulse: 73 75 70   Resp: 10 12 17    Temp:      TempSrc:      SpO2: 96% 100% 100%   Weight:      Height:      PainSc:    0-No pain    Isolation Precautions No active isolations  Medications Medications  sodium chloride 0.9 % bolus 1,000 mL (1,000 mLs Intravenous New Bag/Given 04/10/18 1942)  enoxaparin (LOVENOX) injection 55 mg (55 mg Subcutaneous Given 04/10/18 1727)    Mobility non-ambulatory (PE)

## 2018-04-10 NOTE — ED Notes (Addendum)
Date and time results received: 04/10/18 2035  Test: Trop Critical Value: 0.03  Name of Provider Notified: Roel Cluck, MD Orders Received? Or Actions Taken?:

## 2018-04-10 NOTE — Telephone Encounter (Addendum)
Patient calls stating the right leg is very stiff and upon awakening this morning is slightly swollen as well as ankle, denies warmth, denies pain, would like to be seen in [facility] if possible for someone to take a look at it.  Has CT scan at 11:00 will come to [facility] at 12:00 patient notified.

## 2018-04-10 NOTE — H&P (Signed)
[name] BWI:[id number] DOB: May 09, 1956 DOA: 04/10/2018     PCP: Robyn Haber, MD   Outpatient Specialists:     [facility]   Dr. Burr Medico Patient arrived to ER on 04/10/18 at 1556  Patient coming from:    home Lives   With family    Chief Complaint:  Chief Complaint  Patient presents with  . Shortness of Breath    HPI: David Clarke is a 62 y.o. male with medical history significant of metastatic  Colon cancer rectal adenocarcinoma, neuropathy    Presented with right leg swelling and pain some shortness of breath he presented to his oncology office today first felt right leg pain was secondary to neuropathy but restaging CT scan done today did show PE was progression metastatic disease.  CT scan also showed right heart strain Patient endorses numbness and pain in right anterior lower extremity and stiffness pain shooting down his legs but also associated with some swelling he has been started on physical therapy 2 to 3 weeks ago but symptoms have started to get worse over the past few days. Reports leg tightness and thigh swelling.  Endorsing some shortness of breath worse with exertion in the office oxygen level dropped to 83% on room air with ambulation Sent to emergency department [facility] for further management. Otherwise patient not endorsing any chest pain or cough no fevers no headaches he does have generalized rash which is secondary to recent chemotherapy patient have not attempted to use anything for his symptoms.  Regarding pertinent Chronic problems: Patient was diagnosed with rectal and carcinoma status post radiation and chemotherapy last chemotherapy dose (cycle 9 ofFOLFIRINOX plus Vectibix) was on a 04 April 2018 recent restaging CT scans showed disease progression she was referred to [facility] trial   While in ER: Per pharmacy consult started on Lovenox Did not meet PE code criteria vital stable no evidence of hypotension The following Work up has been  ordered so far:  Orders Placed This Encounter  Procedures  . CBC with Differential/Platelet  . Comprehensive metabolic panel  . Consult to hospitalist  . I-stat chem 8, ed  . Type and screen      Following Medications were ordered in ER: Medications  enoxaparin (LOVENOX) injection 55 mg (55 mg Subcutaneous Given 04/10/18 1727)    Significant initial  Findings: Abnormal Labs Reviewed  CBC WITH DIFFERENTIAL/PLATELET - Abnormal; Notable for the following components:      Result Value   WBC 13.1 (*)    RBC 2.40 (*)    Hemoglobin 7.7 (*)    HCT 23.6 (*)    RDW 17.0 (*)    Platelets 72 (*)    Neutro Abs 12.4 (*)    Lymphs Abs 0.4 (*)    All other components within normal limits  COMPREHENSIVE METABOLIC PANEL - Abnormal; Notable for the following components:   Sodium 134 (*)    CO2 21 (*)    Glucose, Bld 113 (*)    Calcium 8.4 (*)    Total Protein 6.1 (*)    Albumin 2.9 (*)    Alkaline Phosphatase 685 (*)    All other components within normal limits  I-STAT CHEM 8, ED - Abnormal; Notable for the following components:   Sodium 131 (*)    Glucose, Bld 111 (*)    TCO2 19 (*)    Hemoglobin 7.5 (*)    HCT 22.0 (*)    All other components within normal limits  WBC 13.1   K 4.3  Cr    Stable,  Lab Results  Component Value Date   CREATININE 0.80 04/10/2018   CREATININE 0.89 04/10/2018   CREATININE 0.79 04/04/2018      HG/HCT   Down  from baseline see below    Component Value Date/Time   HGB 7.5 (L) 04/10/2018 1728   HGB 12.3 (L) 08/10/2017 0829   HCT 22.0 (L) 04/10/2018 1728   HCT 37.1 (L) 08/10/2017 0829     Plt 72 down from baseline  Troponin (Point of Care Test) No results for input(s): TROPIPOC in the last 72 hours.    Lactic Acid, Venous    Component Value Date/Time   LATICACIDVEN 1.63 12/03/2016 1130      UA  not ordered   CTabd/pelvis -  Large right psoas muscle metastasis, moderate right hydroureteronephrosis   CT A - right lower lobe  PE, inc pulmonary, liver mets. Large right psoas muscle metastasis  ECG:  Personally reviewed by me showing: HR : 71 Rhythm:  NSR   no evidence of ischemic changes QTC 426     ED Triage Vitals  Enc Vitals Group     BP 04/10/18 1609 133/89     Pulse Rate 04/10/18 1609 83     Resp 04/10/18 1609 14     Temp 04/10/18 1609 98 F (36.7 C)     Temp Source 04/10/18 1609 Oral     SpO2 04/10/18 1609 100 %     Weight 04/10/18 1610 123 lb 12.8 oz (56.2 kg)     Height 04/10/18 1610 6' (1.829 m)     Head Circumference --      Peak Flow --      Pain Score 04/10/18 1609 1     Pain Loc --      Pain Edu? --      Excl. in [place]? --   TMAX(24)@       Latest  Blood pressure 134/86, pulse 73, temperature 98 F (36.7 C), temperature source Oral, resp. rate 10, height 6' (1.829 m), weight 56.2 kg, SpO2 96 %.   Hospitalist was called for admission for pulmonary  embolism   Review of Systems:    Pertinent positives include: right leg swelling, shortness of breath at rest.  Constitutional:  No weight loss, night sweats, Fevers, chills, fatigue, weight loss  HEENT:  No headaches, Difficulty swallowing,Tooth/dental problems,Sore throat,  No sneezing, itching, ear ache, nasal congestion, post nasal drip,  Cardio-vascular:  No chest pain, Orthopnea, PND, anasarca, dizziness, palpitations.no Bilateral lower extremity swelling  GI:  No heartburn, indigestion, abdominal pain, nausea, vomiting, diarrhea, change in bowel habits, loss of appetite, melena, blood in stool, hematemesis Resp:    No dyspnea on exertion, No excess mucus, no productive cough, No non-productive cough, No coughing up of blood. No change in color of mucus.No wheezing. Skin:  no rash or lesions. No jaundice GU:  no dysuria, change in color of urine, no urgency or frequency. No straining to urinate.  No flank pain.  Musculoskeletal:  No joint pain or no joint swelling. No decreased range of motion. No back pain.  Psych:  No  change in mood or affect. No depression or anxiety. No memory loss.  Neuro: no localizing neurological complaints, no tingling, no weakness, no double vision, no gait abnormality, no slurred speech, no confusion  All systems reviewed and apart from [place] all are negative  Past Medical History:   Past Medical History:  Diagnosis  Date  . Allergy   . Cancer ([place]) 10/09/2016   rectal adenocarcinoma  . Inguinal hernia       Past Surgical History:  Procedure Laterality Date  . COLONOSCOPY W/ BIOPSIES Bilateral 10/09/2016   rectal adenocarcinoma  . IR FLUORO GUIDE PORT INSERTION RIGHT  11/13/2017  . IR US GUIDE VASC ACCESS RIGHT  11/13/2017  . MOUTH SURGERY     age 43, to remove extra "eye" teeth  . [place] teeth extraction    . XI ROBOT ABDOMINAL PERINEAL RESECTION N/A 02/21/2017   Procedure: XI ROBOT ABDOMINOPERINEAL RESECTION WITH COLOSTOMY;  Surgeon: Michael Boston, MD;  Location: [facility];  Service: General;  Laterality: N/A;    Social History:  Ambulatory   cane,       reports that he quit smoking about 17 years ago. His smoking use included cigarettes. He quit after 2.00 years of use. He has never used smokeless tobacco. He reports that he drank about 10.0 standard drinks of alcohol per week. He reports that he does not use drugs.     Family History:   Family History  Problem Relation Age of Onset  . Other Mother        bile duct tumor  . Stroke Mother   . Cancer Mother 81       bile duct cancer   . Prostate cancer Father 24  . Emphysema Father   . Colon cancer Neg Hx   . Esophageal cancer Neg Hx   . Pancreatic cancer Neg Hx   . Rectal cancer Neg Hx   . Stomach cancer Neg Hx     Allergies: Allergies  Allergen Reactions  . Sulfa Antibiotics Nausea Only and Other (See Comments)    Reaction may years ago, pt thinks nausea only.      Prior to Admission medications   Medication Sig Start Date End Date Taking? Authorizing Provider  Cannabinoids (THC FREE PO) Inhale  into the lungs. THC Oil via vape as needed for sleep   Yes [provider]  clindamycin (CLINDAGEL) 1 % gel Apply topically 2 (two) times daily. 03/14/18  Yes Truitt Merle, MD  ibuprofen (ADVIL,MOTRIN) 200 MG tablet Take 200-600 mg by mouth daily as needed for moderate pain.   Yes [provider]  magnesium oxide (MAG-OX) 400 (241.3 Mg) MG tablet Take 1 tablet (400 mg total) by mouth 2 (two) times daily. 03/21/18  Yes Alla Feeling, NP  Multiple Vitamin (MULTIVITAMIN WITH MINERALS) TABS tablet Take 1 tablet by mouth every other day.    Yes [provider]  nystatin (MYCOSTATIN) 100000 UNIT/ML suspension Take 5 mLs (500,000 Units total) by mouth 4 (four) times daily. 01/10/18  Yes Truitt Merle, MD  omeprazole (PRILOSEC) 40 MG capsule Take 1 capsule (40 mg total) by mouth daily. 02/14/18  Yes Truitt Merle, MD  potassium chloride SA (K-DUR,KLOR-CON) 20 MEQ tablet Take 1 tablet (20 mEq total) by mouth 2 (two) times daily. 02/14/18  Yes Truitt Merle, MD  dexamethasone (DECADRON) 4 MG tablet Take 1 tablet (4 mg total) by mouth daily. Start the day after chemo for 3-5 days. Patient not taking: Reported on 04/10/2018 02/27/18   Alla Feeling, NP  ondansetron (ZOFRAN) 8 MG tablet Take 1 tablet (8 mg total) by mouth 2 (two) times daily as needed for refractory nausea / vomiting. Start on day 3 after chemotherapy. Patient not taking: Reported on 04/04/2018 11/20/17   Truitt Merle, MD  prochlorperazine (COMPAZINE) 10 MG tablet Take 1  tablet (10 mg total) by mouth every 6 (six) hours as needed (NAUSEA). Patient not taking: Reported on 04/04/2018 11/20/17   Truitt Merle, MD   Physical Exam: Blood pressure 134/86, pulse 73, temperature 98 F (36.7 C), temperature source Oral, resp. rate 10, height 6' (1.829 m), weight 56.2 kg, SpO2 96 %. 1. General:  in No Acute distress   Chronically ill cachectic -appearing 2. Psychological: Alert and Oriented 3. Head/ENT:    Dry Mucous Membranes                           Head Non traumatic, neck supple                            Poor Dentition 4. SKIN:  decreased Skin turgor,  Skin clean Dry and intact diffuse rash   5. Heart: Regular rate and rhythm no Murmur, no Rub or gallop 6. Lungs:  no wheezes or crackles   7. Abdomen: Soft,  non-tender, Non distended bowel sounds present 8. Lower extremities: no clubbing, cyanosis,   right leg edema 9. Neurologically   strength 5 out of 5 in all 4 extremities cranial nerves II through XII intact 10. MSK: Normal range of motion   LABS:     Recent Labs  Lab 04/04/18 0839 04/10/18 1705 04/10/18 1728  WBC 26.3* 13.1*  --   NEUTROABS 22.1* 12.4*  --   HGB 10.1* 7.7* 7.5*  HCT 31.8* 23.6* 22.0*  MCV 100.0* 98.3  --   PLT 441* 72*  --    Basic Metabolic Panel: Recent Labs  Lab 04/04/18 0839 04/10/18 1705 04/10/18 1728  NA 136 134* 131*  K 3.8 4.3 4.2  CL 101 101 99  CO2 20* 21*  --   GLUCOSE 142* 113* 111*  BUN 12 22 20   CREATININE 0.79 0.89 0.80  CALCIUM 9.2 8.4*  --   MG 1.6*  --   --       Recent Labs  Lab 04/04/18 0839 04/10/18 1705  AST 49* 33  ALT 17 35  ALKPHOS 730* 685*  BILITOT 0.6 0.8  PROT 7.1 6.1*  ALBUMIN 3.1* 2.9*   No results for input(s): LIPASE, AMYLASE in the last 168 hours. No results for input(s): AMMONIA in the last 168 hours.    HbA1C: No results for input(s): HGBA1C in the last 72 hours. CBG: No results for input(s): GLUCAP in the last 168 hours.    Urine analysis:    Component Value Date/Time   COLORURINE YELLOW 12/07/2016 1934   APPEARANCEUR CLEAR 12/07/2016 1934   LABSPEC 1.020 12/07/2016 1934   PHURINE 5.0 12/07/2016 1934   GLUCOSEU NEGATIVE 12/07/2016 1934   HGBUR SMALL (A) 12/07/2016 [place] NEGATIVE 12/07/2016 [place] NEGATIVE 12/07/2016 1934   PROTEINUR 30 (A) 12/07/2016 1934   NITRITE NEGATIVE 12/07/2016 1934   LEUKOCYTESUR NEGATIVE 12/07/2016 1934     Cultures:    Component Value Date/Time   SDES URINE, CLEAN CATCH  12/07/2016 1934   SPECREQUEST NONE 12/07/2016 1934   CULT  12/07/2016 1934    NO GROWTH Performed at [facility], [place] [address], [place], [place] [address]    REPTSTATUS 12/09/2016 FINAL 12/07/2016 1934     Radiological Exams on Admission: Ct Chest W Contrast  Result Date: 04/10/2018 CLINICAL DATA:  Stage IV rectal adenocarcinoma, presenting for restaging with ongoing chemotherapy. EXAM: CT  CHEST, ABDOMEN, AND PELVIS WITH CONTRAST TECHNIQUE: Multidetector CT imaging of the chest, abdomen and pelvis was performed following the standard protocol during bolus administration of intravenous contrast. CONTRAST:  140mL OMNIPAQUE IOHEXOL 300 MG/ML  SOLN COMPARISON:  01/08/2018 CT chest, abdomen and pelvis. FINDINGS: CT CHEST FINDINGS Cardiovascular: Normal heart size. No significant pericardial effusion/thickening. Right internal jugular MediPort terminates at the cavoatrial junction. Great vessels are normal in course and caliber. There is an acute pulmonary embolus to the right lower lobe involving lobar, segmental and subsegmental branches. Elevated RV/LV ratio 1.08. Mediastinum/Nodes: No discrete thyroid nodules. Unremarkable esophagus. Mildly enlarged 1.3 cm left supraclavicular node (series 2/image 6) is decreased from 2.5 cm. No axillary adenopathy. No mediastinal or hilar adenopathy. Lungs/Pleura: No pneumothorax. No pleural effusion. Numerous solid pulmonary masses and nodules throughout both lungs (greater than 30), significantly increased in size and number. For example, a 3.3 cm right middle lobe mass (series 9/image 129), increased from 1.8 cm. Basilar left lower lobe 2.3 cm nodule (series 9/image 136) is increased from 0.8 cm. Left upper lobe 1.9 cm nodule (series 9/image 49) is increased from 0.8 cm. Musculoskeletal:  No discrete focal osseous lesions in the chest. CT ABDOMEN PELVIS FINDINGS Hepatobiliary: Liver is enlarged by numerous (greater than 10) bulky heterogeneous hypoenhancing  masses, significantly increased in size and number. For example, an 8.0 x 7.5 cm lateral segment left liver lobe mass (series 2/image 71), increased from 2.8 x 1.8 cm. Inferior right liver lobe 7.8 x 4.7 cm mass (series 2/image 74), increased from 2.1 x 2.1 cm. Right liver dome 9.7 x 8.3 cm mass (series 2/image 56), increased from 1.1 x 1.0 cm. Normal gallbladder with no radiopaque cholelithiasis. No biliary ductal dilatation. Pancreas: Normal, with no mass or duct dilation. Spleen: Normal size. No mass. Adrenals/Urinary Tract: No discrete adrenal nodules. Mild-to-moderate right hydroureteronephrosis to the level of mid right lumbar ureter is mildly increased. No left hydronephrosis. Contrast nephrograms are symmetric and within normal limits. Subcentimeter hypodense renal cortical lesions in the upper right kidney are too small to characterize and are stable. No new renal lesions. Bladder is collapsed with stable chronic mild diffuse bladder wall thickening and perivesical fat haziness. Stomach/Bowel: Normal non-distended stomach. Moderate right inguinal hernia containing multiple right pelvic small bowel loops, unchanged. No small bowel dilatation or wall thickening. Oral contrast transits to the colon. Appendix appears normal. Stable postsurgical changes from end sigmoid colostomy in the ventral left abdominal wall with no wall thickening or acute pericolonic fat stranding in the remnant large-bowel. Stable postsurgical and post treatment changes from abdominoperineal resection, with mildly heterogeneous soft tissue density in the presacral space measuring up to 3.4 cm thickness (series 2/image 113), stable. Vascular/Lymphatic: Atherosclerotic nonaneurysmal abdominal aorta. Patent portal, hepatic, splenic and renal veins. Aortocaval adenopathy measures up to 3.5 cm (series 2/image 77), decreased from 4.3 cm. No new adenopathy in the abdomen or pelvis. Reproductive: Stable top-normal size prostate. Other: No  pneumoperitoneum. No ascites. No focal fluid collections. Heterogeneous hypodense right psoas muscle 11.2 x 10.6 cm mass (series 2/image 80), previously 11.8 x 11.1 cm, not appreciably changed. Musculoskeletal: No discrete osseous lesions. Mild lumbar spondylosis. IMPRESSION: 1. Acute pulmonary embolism in the right lower lobe, extending proximally to the lobar branch. 2. Positive for acute PE with CT evidence of right heart strain (RV/LV Ratio = 1.1) consistent with at least submassive (intermediate risk) PE. The presence of right heart strain has been associated with an increased risk of morbidity and mortality. 3. Overall  substantial progression of metastatic disease, although there are mixed changes as detailed below. 4. Pulmonary metastases have significantly increased in size and number. 5. Liver metastases have significantly increased in size and number. 6. Left supraclavicular and retroperitoneal adenopathy is decreased. 7. Large right psoas muscle metastasis is stable. 8. Mild to moderate right hydroureteronephrosis is mildly increased, although there is no evidence of a delayed right contrast nephrogram, suggesting that the degree of ureteral obstruction is low-grade at this time. 9. Stable right inguinal hernia containing small bowel loops without bowel complication. 10. Stable postsurgical/post-treatment changes in the presacral space from APR. 11.  Aortic Atherosclerosis (ICD10-I70.0). Critical Value/emergent results were called by telephone at the time of interpretation on 04/10/2018 at 2:43 pm to Dr. Truitt Merle , who verbally acknowledged these results. Electronically Signed   By: Ilona Sorrel M.D.   On: 04/10/2018 14:47   Ct Abdomen Pelvis W Contrast  Result Date: 04/10/2018 CLINICAL DATA:  Stage IV rectal adenocarcinoma, presenting for restaging with ongoing chemotherapy. EXAM: CT CHEST, ABDOMEN, AND PELVIS WITH CONTRAST TECHNIQUE: Multidetector CT imaging of the chest, abdomen and pelvis was  performed following the standard protocol during bolus administration of intravenous contrast. CONTRAST:  179mL OMNIPAQUE IOHEXOL 300 MG/ML  SOLN COMPARISON:  01/08/2018 CT chest, abdomen and pelvis. FINDINGS: CT CHEST FINDINGS Cardiovascular: Normal heart size. No significant pericardial effusion/thickening. Right internal jugular MediPort terminates at the cavoatrial junction. Great vessels are normal in course and caliber. There is an acute pulmonary embolus to the right lower lobe involving lobar, segmental and subsegmental branches. Elevated RV/LV ratio 1.08. Mediastinum/Nodes: No discrete thyroid nodules. Unremarkable esophagus. Mildly enlarged 1.3 cm left supraclavicular node (series 2/image 6) is decreased from 2.5 cm. No axillary adenopathy. No mediastinal or hilar adenopathy. Lungs/Pleura: No pneumothorax. No pleural effusion. Numerous solid pulmonary masses and nodules throughout both lungs (greater than 30), significantly increased in size and number. For example, a 3.3 cm right middle lobe mass (series 9/image 129), increased from 1.8 cm. Basilar left lower lobe 2.3 cm nodule (series 9/image 136) is increased from 0.8 cm. Left upper lobe 1.9 cm nodule (series 9/image 49) is increased from 0.8 cm. Musculoskeletal:  No discrete focal osseous lesions in the chest. CT ABDOMEN PELVIS FINDINGS Hepatobiliary: Liver is enlarged by numerous (greater than 10) bulky heterogeneous hypoenhancing masses, significantly increased in size and number. For example, an 8.0 x 7.5 cm lateral segment left liver lobe mass (series 2/image 71), increased from 2.8 x 1.8 cm. Inferior right liver lobe 7.8 x 4.7 cm mass (series 2/image 74), increased from 2.1 x 2.1 cm. Right liver dome 9.7 x 8.3 cm mass (series 2/image 56), increased from 1.1 x 1.0 cm. Normal gallbladder with no radiopaque cholelithiasis. No biliary ductal dilatation. Pancreas: Normal, with no mass or duct dilation. Spleen: Normal size. No mass. Adrenals/Urinary  Tract: No discrete adrenal nodules. Mild-to-moderate right hydroureteronephrosis to the level of mid right lumbar ureter is mildly increased. No left hydronephrosis. Contrast nephrograms are symmetric and within normal limits. Subcentimeter hypodense renal cortical lesions in the upper right kidney are too small to characterize and are stable. No new renal lesions. Bladder is collapsed with stable chronic mild diffuse bladder wall thickening and perivesical fat haziness. Stomach/Bowel: Normal non-distended stomach. Moderate right inguinal hernia containing multiple right pelvic small bowel loops, unchanged. No small bowel dilatation or wall thickening. Oral contrast transits to the colon. Appendix appears normal. Stable postsurgical changes from end sigmoid colostomy in the ventral left abdominal wall with no wall  thickening or acute pericolonic fat stranding in the remnant large-bowel. Stable postsurgical and post treatment changes from abdominoperineal resection, with mildly heterogeneous soft tissue density in the presacral space measuring up to 3.4 cm thickness (series 2/image 113), stable. Vascular/Lymphatic: Atherosclerotic nonaneurysmal abdominal aorta. Patent portal, hepatic, splenic and renal veins. Aortocaval adenopathy measures up to 3.5 cm (series 2/image 77), decreased from 4.3 cm. No new adenopathy in the abdomen or pelvis. Reproductive: Stable top-normal size prostate. Other: No pneumoperitoneum. No ascites. No focal fluid collections. Heterogeneous hypodense right psoas muscle 11.2 x 10.6 cm mass (series 2/image 80), previously 11.8 x 11.1 cm, not appreciably changed. Musculoskeletal: No discrete osseous lesions. Mild lumbar spondylosis. IMPRESSION: 1. Acute pulmonary embolism in the right lower lobe, extending proximally to the lobar branch. 2. Positive for acute PE with CT evidence of right heart strain (RV/LV Ratio = 1.1) consistent with at least submassive (intermediate risk) PE. The presence of  right heart strain has been associated with an increased risk of morbidity and mortality. 3. Overall substantial progression of metastatic disease, although there are mixed changes as detailed below. 4. Pulmonary metastases have significantly increased in size and number. 5. Liver metastases have significantly increased in size and number. 6. Left supraclavicular and retroperitoneal adenopathy is decreased. 7. Large right psoas muscle metastasis is stable. 8. Mild to moderate right hydroureteronephrosis is mildly increased, although there is no evidence of a delayed right contrast nephrogram, suggesting that the degree of ureteral obstruction is low-grade at this time. 9. Stable right inguinal hernia containing small bowel loops without bowel complication. 10. Stable postsurgical/post-treatment changes in the presacral space from APR. 11.  Aortic Atherosclerosis (ICD10-I70.0). Critical Value/emergent results were called by telephone at the time of interpretation on 04/10/2018 at 2:43 pm to Dr. Truitt Merle , who verbally acknowledged these results. Electronically Signed   By: Ilona Sorrel M.D.   On: 04/10/2018 14:47    Chart has been reviewed    Assessment/Plan  62 y.o. male with medical history significant of metastatic  Colon cancer rectal adenocarcinoma, neuropathy  Admitted for  pulmonary  embolism  Present on Admission: . Rectal adenocarcinoma ([place]) - unfortunately progressive, oncology is aware patient  being admitted discussed with Dr. Burr Medico who will see patient in the morning . Pulmonary embolism ([place]) -  Admit to  telemetry initially started on Lovenox given thrombocytopenia will switch to heparin drip in 12 hours as per oncology recommendations pharmacy to dose discussed with pharmacist and oncology  Would likely benefit from case manager consult for long term anticoagulation Blood pressure stable Cycle cardiac enzymes for prognostication Order echogram and lower extremity Dopplers  Most likely  risk factors for hypercoagulable state being  malignancy    . Thrombocytopenia ([place]) -the setting of recent chemo therapy likely chemotherapy no indication for transfusion at this point continue to monitor . Anemia discussed with oncology at this point would like to hold off on blood transfusion repeat in a.m. if below 7.5 would give 1 unit Slightly elevated troponin in the setting of PE already on anticoagulation Elevated white blood cell count in the setting of recent granulocyte suport Other plan as per orders.  DVT prophylaxis:   Heparin     Code Status:  FULL CODE  as per patient   I had personally discussed CODE STATUS with patient and family  Family Communication:   Family  at  Bedside  plan of care was discussed with  Daughter,  With partner  Disposition Plan:  To home once workup is complete and patient is stable                                         Care manager                     Consults called: Oncology  Admission status:   Inpatient    Level of care     tele         Toy Baker 04/10/2018, 9:09 PM    [facility]  Pager [phone]   after 2 AM please page floor coverage PA If 7AM-7PM, please contact the day team taking care of the patient  [email or web address]  Password [email or web address]

## 2018-04-10 NOTE — ED Notes (Signed)
Bed: VS25 Expected date:  Expected time:  Means of arrival:  Comments: [facility] center- right sided PE, hx mets colon CA

## 2018-04-10 NOTE — ED Provider Notes (Signed)
[facility] DEPT Provider Note   CSN: [id number] Arrival date & time: 04/10/18  1556     History   Chief Complaint Chief Complaint  Patient presents with  . Shortness of Breath    HPI David Clarke is a 62 y.o. male.  Patient complains of some shortness of breath.  He was seen by his oncologist today and they did a CT scan that shows he has a PE.  The history is provided by the patient. No language interpreter was used.  Shortness of Breath  This is a new problem. The problem occurs continuously.The current episode started more than 2 days ago. The problem has not changed since onset.Pertinent negatives include no fever, no headaches, no cough, no chest pain, no abdominal pain and no rash. Precipitated by: Unknown. He has tried nothing for the symptoms. The treatment provided no relief. He has had prior hospitalizations. He has had prior ED visits. Associated medical issues do not include asthma.    Past Medical History:  Diagnosis Date  . Allergy   . Cancer ([place]) 10/09/2016   rectal adenocarcinoma  . Inguinal hernia     Patient Active Problem List   Diagnosis Date Noted  . Port-A-Cath in place 02/27/2018  . Goals of care, counseling/discussion 10/30/2017  . 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 Fever   . Dehydration   . Diarrhea   . 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 Hypokalemia 12/03/2016  . Leukopenia 12/03/2016  . Prolonged Q-T interval on ECG 12/03/2016  . Oral thrush 12/03/2016  . Hemochromatosis 11/19/2016  . Family history of hemochromatosis 10/25/2016  . Iron deficiency anemia due to chronic blood loss 10/25/2016  . Rectal adenocarcinoma ([place]) 10/05/2016    Past Surgical History:  Procedure Laterality Date  . COLONOSCOPY W/ BIOPSIES Bilateral 10/09/2016   rectal adenocarcinoma  . IR FLUORO GUIDE PORT INSERTION RIGHT  11/13/2017  . IR US GUIDE VASC ACCESS RIGHT  11/13/2017  . MOUTH  SURGERY     age 48, to remove extra "eye" teeth  . [place] teeth extraction    . XI ROBOT ABDOMINAL PERINEAL RESECTION N/A 02/21/2017   Procedure: XI ROBOT ABDOMINOPERINEAL RESECTION WITH COLOSTOMY;  Surgeon: Michael Boston, MD;  Location: [facility] ORS;  Service: General;  Laterality: N/A;        Home Medications    Prior to Admission medications   Medication Sig Start Date End Date Taking? Authorizing Provider  Cannabinoids (THC FREE PO) Inhale into the lungs. THC Oil via vape as needed for sleep   Yes [provider]  clindamycin (CLINDAGEL) 1 % gel Apply topically 2 (two) times daily. 03/14/18  Yes Truitt Merle, MD  ibuprofen (ADVIL,MOTRIN) 200 MG tablet Take 200-600 mg by mouth daily as needed for moderate pain.   Yes [provider]  magnesium oxide (MAG-OX) 400 (241.3 Mg) MG tablet Take 1 tablet (400 mg total) by mouth 2 (two) times daily. 03/21/18  Yes Alla Feeling, NP  Multiple Vitamin (MULTIVITAMIN WITH MINERALS) TABS tablet Take 1 tablet by mouth every other day.    Yes [provider]  nystatin (MYCOSTATIN) 100000 UNIT/ML suspension Take 5 mLs (500,000 Units total) by mouth 4 (four) times daily. 01/10/18  Yes Truitt Merle, MD  omeprazole (PRILOSEC) 40 MG capsule Take 1 capsule (40 mg total) by mouth daily. 02/14/18  Yes Truitt Merle, MD  potassium chloride SA (K-DUR,KLOR-CON) 20 MEQ tablet Take 1 tablet (20 mEq total) by mouth 2 (  two) times daily. 02/14/18  Yes Truitt Merle, MD  dexamethasone (DECADRON) 4 MG tablet Take 1 tablet (4 mg total) by mouth daily. Start the day after chemo for 3-5 days. Patient not taking: Reported on 04/10/2018 02/27/18   Alla Feeling, NP  ondansetron (ZOFRAN) 8 MG tablet Take 1 tablet (8 mg total) by mouth 2 (two) times daily as needed for refractory nausea / vomiting. Start on day 3 after chemotherapy. Patient not taking: Reported on 04/04/2018 11/20/17   Truitt Merle, MD  prochlorperazine (COMPAZINE) 10 MG tablet Take 1 tablet (10 mg total) by mouth  every 6 (six) hours as needed (NAUSEA). Patient not taking: Reported on 04/04/2018 11/20/17   Truitt Merle, MD    Family History Family History  Problem Relation Age of Onset  . Other Mother        bile duct tumor  . Stroke Mother   . Cancer Mother 62       bile duct cancer   . Prostate cancer Father 40  . Emphysema Father   . Colon cancer Neg Hx   . Esophageal cancer Neg Hx   . Pancreatic cancer Neg Hx   . Rectal cancer Neg Hx   . Stomach cancer Neg Hx     Social History Social History   Tobacco Use  . Smoking status: Former Smoker    Years: 2.00    Types: Cigarettes    Last attempt to quit: 2002    Years since quitting: 17.6  . Smokeless tobacco: Never Used  Substance Use Topics  . Alcohol use: Not Currently    Alcohol/week: 10.0 standard drinks    Types: 4 Glasses of wine, 6 Cans of beer per week    Comment: beer or a glass or wine most days of the week  . Drug use: No     Allergies   Sulfa antibiotics   Review of Systems Review of Systems  Constitutional: Negative for appetite change, fatigue and fever.  HENT: Negative for congestion, ear discharge and sinus pressure.   Eyes: Negative for discharge.  Respiratory: Positive for shortness of breath. Negative for cough.   Cardiovascular: Negative for chest pain.  Gastrointestinal: Negative for abdominal pain and diarrhea.  Genitourinary: Negative for frequency and hematuria.  Musculoskeletal: Negative for back pain.  Skin: Negative for rash.  Neurological: Negative for seizures and headaches.  Psychiatric/Behavioral: Negative for hallucinations.     Physical Exam Updated Vital Signs BP 134/86 (BP Location: Left Arm)   Pulse 73   Temp 98 F (36.7 C) (Oral)   Resp 10   Ht 6' (1.829 m)   Wt 56.2 kg   SpO2 96%   BMI 16.79 kg/m   Physical Exam  Constitutional: He is oriented to person, place, and time.  Cachectic  HENT:  Head: Normocephalic.  Eyes: Conjunctivae and EOM are normal. No scleral icterus.   Neck: Neck supple. No thyromegaly present.  Cardiovascular: Normal rate and regular rhythm. Exam reveals no gallop and no friction rub.  No murmur heard. Pulmonary/Chest: No stridor. He has no wheezes. He has no rales. He exhibits no tenderness.  Abdominal: He exhibits no distension. There is no tenderness. There is no rebound.  Musculoskeletal: Normal range of motion. He exhibits no edema.  Lymphadenopathy:    He has no cervical adenopathy.  Neurological: He is oriented to person, place, and time. He exhibits normal muscle tone. Coordination normal.  Skin: No rash noted. No erythema.  Psychiatric: He has a normal mood  and affect. His behavior is normal.     ED Treatments / Results  Labs (all labs ordered are listed, but only abnormal results are displayed) Labs Reviewed  CBC WITH DIFFERENTIAL/PLATELET - Abnormal; Notable for the following components:      Result Value   WBC 13.1 (*)    RBC 2.40 (*)    Hemoglobin 7.7 (*)    HCT 23.6 (*)    RDW 17.0 (*)    Platelets 72 (*)    Neutro Abs 12.4 (*)    Lymphs Abs 0.4 (*)    All other components within normal limits  COMPREHENSIVE METABOLIC PANEL - Abnormal; Notable for the following components:   Sodium 134 (*)    CO2 21 (*)    Glucose, Bld 113 (*)    Calcium 8.4 (*)    Total Protein 6.1 (*)    Albumin 2.9 (*)    Alkaline Phosphatase 685 (*)    All other components within normal limits  I-STAT CHEM 8, ED - Abnormal; Notable for the following components:   Sodium 131 (*)    Glucose, Bld 111 (*)    TCO2 19 (*)    Hemoglobin 7.5 (*)    HCT 22.0 (*)    All other components within normal limits  TYPE AND SCREEN    EKG None  Radiology Ct Chest W Contrast  Result Date: 04/10/2018 CLINICAL DATA:  Stage IV rectal adenocarcinoma, presenting for restaging with ongoing chemotherapy. EXAM: CT CHEST, ABDOMEN, AND PELVIS WITH CONTRAST TECHNIQUE: Multidetector CT imaging of the chest, abdomen and pelvis was performed following the  standard protocol during bolus administration of intravenous contrast. CONTRAST:  185mL OMNIPAQUE IOHEXOL 300 MG/ML  SOLN COMPARISON:  01/08/2018 CT chest, abdomen and pelvis. FINDINGS: CT CHEST FINDINGS Cardiovascular: Normal heart size. No significant pericardial effusion/thickening. Right internal jugular MediPort terminates at the cavoatrial junction. Great vessels are normal in course and caliber. There is an acute pulmonary embolus to the right lower lobe involving lobar, segmental and subsegmental branches. Elevated RV/LV ratio 1.08. Mediastinum/Nodes: No discrete thyroid nodules. Unremarkable esophagus. Mildly enlarged 1.3 cm left supraclavicular node (series 2/image 6) is decreased from 2.5 cm. No axillary adenopathy. No mediastinal or hilar adenopathy. Lungs/Pleura: No pneumothorax. No pleural effusion. Numerous solid pulmonary masses and nodules throughout both lungs (greater than 30), significantly increased in size and number. For example, a 3.3 cm right middle lobe mass (series 9/image 129), increased from 1.8 cm. Basilar left lower lobe 2.3 cm nodule (series 9/image 136) is increased from 0.8 cm. Left upper lobe 1.9 cm nodule (series 9/image 49) is increased from 0.8 cm. Musculoskeletal:  No discrete focal osseous lesions in the chest. CT ABDOMEN PELVIS FINDINGS Hepatobiliary: Liver is enlarged by numerous (greater than 10) bulky heterogeneous hypoenhancing masses, significantly increased in size and number. For example, an 8.0 x 7.5 cm lateral segment left liver lobe mass (series 2/image 71), increased from 2.8 x 1.8 cm. Inferior right liver lobe 7.8 x 4.7 cm mass (series 2/image 74), increased from 2.1 x 2.1 cm. Right liver dome 9.7 x 8.3 cm mass (series 2/image 56), increased from 1.1 x 1.0 cm. Normal gallbladder with no radiopaque cholelithiasis. No biliary ductal dilatation. Pancreas: Normal, with no mass or duct dilation. Spleen: Normal size. No mass. Adrenals/Urinary Tract: No discrete adrenal  nodules. Mild-to-moderate right hydroureteronephrosis to the level of mid right lumbar ureter is mildly increased. No left hydronephrosis. Contrast nephrograms are symmetric and within normal limits. Subcentimeter hypodense renal cortical lesions in  the upper right kidney are too small to characterize and are stable. No new renal lesions. Bladder is collapsed with stable chronic mild diffuse bladder wall thickening and perivesical fat haziness. Stomach/Bowel: Normal non-distended stomach. Moderate right inguinal hernia containing multiple right pelvic small bowel loops, unchanged. No small bowel dilatation or wall thickening. Oral contrast transits to the colon. Appendix appears normal. Stable postsurgical changes from end sigmoid colostomy in the ventral left abdominal wall with no wall thickening or acute pericolonic fat stranding in the remnant large-bowel. Stable postsurgical and post treatment changes from abdominoperineal resection, with mildly heterogeneous soft tissue density in the presacral space measuring up to 3.4 cm thickness (series 2/image 113), stable. Vascular/Lymphatic: Atherosclerotic nonaneurysmal abdominal aorta. Patent portal, hepatic, splenic and renal veins. Aortocaval adenopathy measures up to 3.5 cm (series 2/image 77), decreased from 4.3 cm. No new adenopathy in the abdomen or pelvis. Reproductive: Stable top-normal size prostate. Other: No pneumoperitoneum. No ascites. No focal fluid collections. Heterogeneous hypodense right psoas muscle 11.2 x 10.6 cm mass (series 2/image 80), previously 11.8 x 11.1 cm, not appreciably changed. Musculoskeletal: No discrete osseous lesions. Mild lumbar spondylosis. IMPRESSION: 1. Acute pulmonary embolism in the right lower lobe, extending proximally to the lobar branch. 2. Positive for acute PE with CT evidence of right heart strain (RV/LV Ratio = 1.1) consistent with at least submassive (intermediate risk) PE. The presence of right heart strain has been  associated with an increased risk of morbidity and mortality. 3. Overall substantial progression of metastatic disease, although there are mixed changes as detailed below. 4. Pulmonary metastases have significantly increased in size and number. 5. Liver metastases have significantly increased in size and number. 6. Left supraclavicular and retroperitoneal adenopathy is decreased. 7. Large right psoas muscle metastasis is stable. 8. Mild to moderate right hydroureteronephrosis is mildly increased, although there is no evidence of a delayed right contrast nephrogram, suggesting that the degree of ureteral obstruction is low-grade at this time. 9. Stable right inguinal hernia containing small bowel loops without bowel complication. 10. Stable postsurgical/post-treatment changes in the presacral space from APR. 11.  Aortic Atherosclerosis (ICD10-I70.0). Critical Value/emergent results were called by telephone at the time of interpretation on 04/10/2018 at 2:43 pm to Dr. Truitt Merle , who verbally acknowledged these results. Electronically Signed   By: Ilona Sorrel M.D.   On: 04/10/2018 14:47   Ct Abdomen Pelvis W Contrast  Result Date: 04/10/2018 CLINICAL DATA:  Stage IV rectal adenocarcinoma, presenting for restaging with ongoing chemotherapy. EXAM: CT CHEST, ABDOMEN, AND PELVIS WITH CONTRAST TECHNIQUE: Multidetector CT imaging of the chest, abdomen and pelvis was performed following the standard protocol during bolus administration of intravenous contrast. CONTRAST:  154mL OMNIPAQUE IOHEXOL 300 MG/ML  SOLN COMPARISON:  01/08/2018 CT chest, abdomen and pelvis. FINDINGS: CT CHEST FINDINGS Cardiovascular: Normal heart size. No significant pericardial effusion/thickening. Right internal jugular MediPort terminates at the cavoatrial junction. Great vessels are normal in course and caliber. There is an acute pulmonary embolus to the right lower lobe involving lobar, segmental and subsegmental branches. Elevated RV/LV ratio  1.08. Mediastinum/Nodes: No discrete thyroid nodules. Unremarkable esophagus. Mildly enlarged 1.3 cm left supraclavicular node (series 2/image 6) is decreased from 2.5 cm. No axillary adenopathy. No mediastinal or hilar adenopathy. Lungs/Pleura: No pneumothorax. No pleural effusion. Numerous solid pulmonary masses and nodules throughout both lungs (greater than 30), significantly increased in size and number. For example, a 3.3 cm right middle lobe mass (series 9/image 129), increased from 1.8 cm. Basilar left lower lobe  2.3 cm nodule (series 9/image 136) is increased from 0.8 cm. Left upper lobe 1.9 cm nodule (series 9/image 49) is increased from 0.8 cm. Musculoskeletal:  No discrete focal osseous lesions in the chest. CT ABDOMEN PELVIS FINDINGS Hepatobiliary: Liver is enlarged by numerous (greater than 10) bulky heterogeneous hypoenhancing masses, significantly increased in size and number. For example, an 8.0 x 7.5 cm lateral segment left liver lobe mass (series 2/image 71), increased from 2.8 x 1.8 cm. Inferior right liver lobe 7.8 x 4.7 cm mass (series 2/image 74), increased from 2.1 x 2.1 cm. Right liver dome 9.7 x 8.3 cm mass (series 2/image 56), increased from 1.1 x 1.0 cm. Normal gallbladder with no radiopaque cholelithiasis. No biliary ductal dilatation. Pancreas: Normal, with no mass or duct dilation. Spleen: Normal size. No mass. Adrenals/Urinary Tract: No discrete adrenal nodules. Mild-to-moderate right hydroureteronephrosis to the level of mid right lumbar ureter is mildly increased. No left hydronephrosis. Contrast nephrograms are symmetric and within normal limits. Subcentimeter hypodense renal cortical lesions in the upper right kidney are too small to characterize and are stable. No new renal lesions. Bladder is collapsed with stable chronic mild diffuse bladder wall thickening and perivesical fat haziness. Stomach/Bowel: Normal non-distended stomach. Moderate right inguinal hernia containing  multiple right pelvic small bowel loops, unchanged. No small bowel dilatation or wall thickening. Oral contrast transits to the colon. Appendix appears normal. Stable postsurgical changes from end sigmoid colostomy in the ventral left abdominal wall with no wall thickening or acute pericolonic fat stranding in the remnant large-bowel. Stable postsurgical and post treatment changes from abdominoperineal resection, with mildly heterogeneous soft tissue density in the presacral space measuring up to 3.4 cm thickness (series 2/image 113), stable. Vascular/Lymphatic: Atherosclerotic nonaneurysmal abdominal aorta. Patent portal, hepatic, splenic and renal veins. Aortocaval adenopathy measures up to 3.5 cm (series 2/image 77), decreased from 4.3 cm. No new adenopathy in the abdomen or pelvis. Reproductive: Stable top-normal size prostate. Other: No pneumoperitoneum. No ascites. No focal fluid collections. Heterogeneous hypodense right psoas muscle 11.2 x 10.6 cm mass (series 2/image 80), previously 11.8 x 11.1 cm, not appreciably changed. Musculoskeletal: No discrete osseous lesions. Mild lumbar spondylosis. IMPRESSION: 1. Acute pulmonary embolism in the right lower lobe, extending proximally to the lobar branch. 2. Positive for acute PE with CT evidence of right heart strain (RV/LV Ratio = 1.1) consistent with at least submassive (intermediate risk) PE. The presence of right heart strain has been associated with an increased risk of morbidity and mortality. 3. Overall substantial progression of metastatic disease, although there are mixed changes as detailed below. 4. Pulmonary metastases have significantly increased in size and number. 5. Liver metastases have significantly increased in size and number. 6. Left supraclavicular and retroperitoneal adenopathy is decreased. 7. Large right psoas muscle metastasis is stable. 8. Mild to moderate right hydroureteronephrosis is mildly increased, although there is no evidence of  a delayed right contrast nephrogram, suggesting that the degree of ureteral obstruction is low-grade at this time. 9. Stable right inguinal hernia containing small bowel loops without bowel complication. 10. Stable postsurgical/post-treatment changes in the presacral space from APR. 11.  Aortic Atherosclerosis (ICD10-I70.0). Critical Value/emergent results were called by telephone at the time of interpretation on 04/10/2018 at 2:43 pm to Dr. Truitt Merle , who verbally acknowledged these results. Electronically Signed   By: Ilona Sorrel M.D.   On: 04/10/2018 14:47    Procedures Procedures (including critical care time)  Medications Ordered in ED Medications  enoxaparin (LOVENOX) injection 55  mg (55 mg Subcutaneous Given 04/10/18 1727)     Initial Impression / Assessment and Plan / ED Course  I have reviewed the triage vital signs and the nursing notes.  Pertinent labs & imaging results that were available during my care of the patient were reviewed by me and considered in my medical decision making (see chart for details).   Patient with PE on CT scan.   Patient will be admitted to medicine and treated with anticoagulants.   Final Clinical Impressions(s) / ED Diagnoses   Final diagnoses:  None    ED Discharge Orders    None       Milton Ferguson, MD 04/10/18 1909

## 2018-04-10 NOTE — Progress Notes (Addendum)
Sympt[facility] Management [facility] Progress Note   David Clarke [id number] April 27, 1956 62 y.o.  David Clarke is managed by Dr. Truitt Merle  Actively treated with chemotherapy/immunotherapy: yes  Current Therapy: FOLFIRINOX plus Vectibix   Last Treated: 04/04/2018 (cycle 9)  Assessment: Plan:    Mononeuropathy  Pain of right lower extremity  Right leg swelling  Rectal adenocarcinoma (HCC)   Neuropathy and pain of the right lower extremity with swelling of the right leg: The patient's exam was benign.  Restaging CT scans completed today which showed a PE.  After discussion with Dr. Burr Medico is believe that the patient could have a subtle DVT in the right thigh despite his normal physical exam today.  Rectal adenocarcinoma: The patient is status post cycle 9 of FOLFIRINOX plus Vectibix which was dosed on 04/04/2018.  The patient is status post restaging CT scans earlier today which showed disease progression.  Dr. Truitt Merle is referring the patient to [facility] for consideration of clinical trial.  Please see After Visit Summary for patient specific instructions.  Future Appointments  Date Time Provider [place]  04/16/2018  3:30 PM [place], [place] T, [place] [facility]  04/18/2018  8:45 AM [facility] 2 [facility] None  04/18/2018  9:00 AM [facility] [place] [facility] None  04/18/2018  9:30 AM Alla Feeling, NP [facility] None  04/20/2018  2:00 PM [facility] [place] FL[facility] None  05/02/2018  8:30 AM [facility] LAB 2 [facility] None  05/02/2018  8:30 AM [facility] [place] FLUSH [facility] None  05/02/2018  8:45 AM Truitt Merle, MD [facility] None  05/02/2018  9:30 AM [facility] INFUSION [facility] None  05/04/2018  2:00 PM [facility] [place] FLUSH [facility] None  05/16/2018  8:45 AM [facility] LAB 1 [facility] None  05/16/2018  9:00 AM [facility] [place] FLUSH [facility] None  05/16/2018  9:30 AM Alla Feeling, NP [facility] None  05/16/2018 10:00 AM [facility] INFUSION [facility] None  05/18/2018   1:30 PM [facility] [place] FLUSH [facility] None    No orders of the defined types were placed in this encounter.      Subjective:   Patient ID:  David Clarke is a 62 y.o. (DOB 12-28-1955) male.  Chief Complaint:  Chief Complaint  Patient presents with  . Edema    HPI David Clarke is a 62 year old male with a history of a metastatic colorectal adenocarcinoma who is managed by Dr. Burr Medico and has most recently been treated with cycle 9 ofFOLFIRINOX plus Vectibix which was dosed on 04/04/2018.  [name] is status post restaging CT scans earlier today.  He presents to the office today with a report that he has had numbness in his right anterior lower extremity with stiffness in his right leg with a pain that is shooting down his leg.  He is having some swelling in his right lower extremity.  He had previously been on gabapentin which she has stopped.  He began physical therapy around 2 to 3 weeks ago.  The symptoms that he is noted in his right lower extremity have only occurred over the last couple of days.  He presents to the clinic today with his daughter.  He continues to have an acneform rash over most of his body which is believed secondary to vectibix.  Medications: I have reviewed the patient's current medications.  Allergies:  Allergies  Allergen Reactions  . Sulfa Antibiotics Nausea Only and Other (See Comments)    Reaction may years ago, pt thinks nausea only.  Past Medical History:  Diagnosis Date  . Allergy   . Cancer ([place]) 10/09/2016   rectal adenocarcinoma  . Inguinal hernia     Past Surgical History:  Procedure Laterality Date  . COLONOSCOPY W/ BIOPSIES Bilateral 10/09/2016   rectal adenocarcinoma  . IR FLUORO GUIDE PORT INSERTION RIGHT  11/13/2017  . IR US GUIDE VASC ACCESS RIGHT  11/13/2017  . MOUTH SURGERY     age 59, to remove extra "eye" teeth  . [place] teeth extraction    . XI ROBOT ABDOMINAL PERINEAL RESECTION N/A 02/21/2017   Procedure: XI ROBOT  ABDOMINOPERINEAL RESECTION WITH COLOSTOMY;  Surgeon: Michael Boston, MD;  Location: [facility];  Service: General;  Laterality: N/A;    Family History  Problem Relation Age of Onset  . Other Mother        bile duct tumor  . Stroke Mother   . Cancer Mother 58       bile duct cancer   . Prostate cancer Father 110  . Emphysema Father   . Colon cancer Neg Hx   . Esophageal cancer Neg Hx   . Pancreatic cancer Neg Hx   . Rectal cancer Neg Hx   . Stomach cancer Neg Hx     Social History   Socioeconomic History  . Marital status: Soil scientist    Spouse name: Not on file  . Number of children: 1  . Years of education: Not on file  . Highest education level: Not on file  Occupational History  . Occupation: Architectural technologist  Social Needs  . Financial resource strain: Not on file  . Food insecurity:    Worry: Not on file    Inability: Not on file  . Transportation needs:    Medical: Not on file    Non-medical: Not on file  Tobacco Use  . Smoking status: Former Smoker    Years: 2.00    Types: Cigarettes    Last attempt to quit: 2002    Years since quitting: 17.6  . Smokeless tobacco: Never Used  Substance and Sexual Activity  . Alcohol use: Not Currently    Alcohol/week: 10.0 standard drinks    Types: 4 Glasses of wine, 6 Cans of beer per week    Comment: beer or a glass or wine most days of the week  . Drug use: No  . Sexual activity: Yes    Partners: Female  Lifestyle  . Physical activity:    Days per week: Not on file    Minutes per session: Not on file  . Stress: Not on file  Relationships  . Social connections:    Talks on phone: Not on file    Gets together: Not on file    Attends religious service: Not on file    Active member of club or organization: Not on file    Attends meetings of clubs or organizations: Not on file    Relationship status: Not on file  . Intimate partner violence:    Fear of current or ex partner: Not on file    Emotionally abused:  Not on file    Physically abused: Not on file    Forced sexual activity: Not on file  Other Topics Concern  . Not on file  Social History Narrative  . Not on file    Past Medical History, Surgical history, Social history, and Family history were reviewed and updated as appropriate.   Please see review of systems for further details on the patient's  review from today.   Review of Systems:  Review of Systems  Constitutional: Negative for chills, diaphoresis and fever.  HENT: Negative for facial swelling and trouble swallowing.   Respiratory: Negative for cough, chest tightness and shortness of breath.   Cardiovascular: Positive for leg swelling. Negative for chest pain.  Skin: Positive for rash.  Neurological: Positive for numbness.    Objective:   Physical Exam:  BP 113/87 (BP Location: Left Arm, Patient Position: Sitting)   Pulse 73   Resp 18   Ht 6' (1.829 m)   Wt 123 lb 12.8 oz (56.2 kg)   SpO2 100%   BMI 16.79 kg/m  ECOG: 0  Physical Exam  Constitutional: No distress.  HENT:  Head: Normocephalic and atraumatic.  Mouth/Throat: Oropharynx is clear and moist.  Cardiovascular: Normal rate, regular rhythm, normal heart sounds and intact distal pulses. Exam reveals no gallop and no friction rub.  No murmur heard. Pulses:      Dorsalis pedis pulses are 2+ on the right side, and 2+ on the left side.       Posterior tibial pulses are 2+ on the right side, and 2+ on the left side.  Pulmonary/Chest: Effort normal and breath sounds normal. No stridor. No respiratory distress. He has no wheezes. He has no rales.  Musculoskeletal: He exhibits no edema or deformity.  Mild swelling with a small effusion of the right knee. Calves are bilaterally equal at 31 cm at 12 cm distal to the inferior pole of the patella.  Neurological: He is alert. Coordination normal.  Skin: Skin is warm and dry. Rash (A wide spread acneiform rash is noted over most of the body.) noted. He is not  diaphoretic.    Lab Review:     Component Value Date/Time   NA 136 04/04/2018 0839   NA 137 08/10/2017 0829   K 3.8 04/04/2018 0839   K 3.5 08/10/2017 0829   CL 101 04/04/2018 0839   CO2 20 (L) 04/04/2018 0839   CO2 21 (L) 08/10/2017 0829   GLUCOSE 142 (H) 04/04/2018 0839   GLUCOSE 133 08/10/2017 0829   BUN 12 04/04/2018 0839   BUN 16.5 08/10/2017 0829   CREATININE 0.79 04/04/2018 0839   CREATININE 0.8 08/10/2017 0829   CALCIUM 9.2 04/04/2018 0839   CALCIUM 9.0 08/10/2017 0829   PROT 7.1 04/04/2018 0839   PROT 7.1 08/10/2017 0829   ALBUMIN 3.1 (L) 04/04/2018 0839   ALBUMIN 3.5 08/10/2017 0829   AST 49 (H) 04/04/2018 0839   AST 47 (H) 08/10/2017 0829   ALT 17 04/04/2018 0839   ALT 38 08/10/2017 0829   ALKPHOS 730 (H) 04/04/2018 0839   ALKPHOS 137 08/10/2017 0829   BILITOT 0.6 04/04/2018 0839   BILITOT 1.32 (H) 08/10/2017 0829   GFRNONAA >60 04/04/2018 0839   GFRAA >60 04/04/2018 0839       Component Value Date/Time   WBC 26.3 (H) 04/04/2018 0839   RBC 3.18 (L) 04/04/2018 0839   HGB 10.1 (L) 04/04/2018 0839   HGB 12.3 (L) 08/10/2017 0829   HCT 31.8 (L) 04/04/2018 0839   HCT 37.1 (L) 08/10/2017 0829   PLT 441 (H) 04/04/2018 0839   PLT 123 (L) 08/10/2017 0829   MCV 100.0 (H) 04/04/2018 0839   MCV 106.0 (H) 08/10/2017 0829   MCH 31.8 04/04/2018 0839   MCHC 31.8 (L) 04/04/2018 0839   RDW 16.7 (H) 04/04/2018 0839   RDW 19.4 (H) 08/10/2017 0829   LYMPHSABS 1.1 04/04/2018 0932  LYMPHSABS 0.6 (L) 08/10/2017 0829   MONOABS 3.0 (H) 04/04/2018 0839   MONOABS 0.6 08/10/2017 0829   EOSABS 0.0 04/04/2018 0839   EOSABS 0.0 08/10/2017 0829   BASOSABS 0.1 04/04/2018 0839   BASOSABS 0.0 08/10/2017 0829   -------------------------------  Imaging from last 24 hours (if applicable):  Radiology interpretation: Ct Chest W Contrast  Result Date: 04/10/2018 CLINICAL DATA:  Stage IV rectal adenocarcinoma, presenting for restaging with ongoing chemotherapy. EXAM: CT CHEST,  ABDOMEN, AND PELVIS WITH CONTRAST TECHNIQUE: Multidetector CT imaging of the chest, abdomen and pelvis was performed following the standard protocol during bolus administration of intravenous contrast. CONTRAST:  125mL OMNIPAQUE IOHEXOL 300 MG/ML  SOLN COMPARISON:  01/08/2018 CT chest, abdomen and pelvis. FINDINGS: CT CHEST FINDINGS Cardiovascular: Normal heart size. No significant pericardial effusion/thickening. Right internal jugular MediPort terminates at the cavoatrial junction. Great vessels are normal in course and caliber. There is an acute pulmonary embolus to the right lower lobe involving lobar, segmental and subsegmental branches. Elevated RV/LV ratio 1.08. Mediastinum/Nodes: No discrete thyroid nodules. Unremarkable esophagus. Mildly enlarged 1.3 cm left supraclavicular node (series 2/image 6) is decreased from 2.5 cm. No axillary adenopathy. No mediastinal or hilar adenopathy. Lungs/Pleura: No pneumothorax. No pleural effusion. Numerous solid pulmonary masses and nodules throughout both lungs (greater than 30), significantly increased in size and number. For example, a 3.3 cm right middle lobe mass (series 9/image 129), increased from 1.8 cm. Basilar left lower lobe 2.3 cm nodule (series 9/image 136) is increased from 0.8 cm. Left upper lobe 1.9 cm nodule (series 9/image 49) is increased from 0.8 cm. Musculoskeletal:  No discrete focal osseous lesions in the chest. CT ABDOMEN PELVIS FINDINGS Hepatobiliary: Liver is enlarged by numerous (greater than 10) bulky heterogeneous hypoenhancing masses, significantly increased in size and number. For example, an 8.0 x 7.5 cm lateral segment left liver lobe mass (series 2/image 71), increased from 2.8 x 1.8 cm. Inferior right liver lobe 7.8 x 4.7 cm mass (series 2/image 74), increased from 2.1 x 2.1 cm. Right liver dome 9.7 x 8.3 cm mass (series 2/image 56), increased from 1.1 x 1.0 cm. Normal gallbladder with no radiopaque cholelithiasis. No biliary ductal  dilatation. Pancreas: Normal, with no mass or duct dilation. Spleen: Normal size. No mass. Adrenals/Urinary Tract: No discrete adrenal nodules. Mild-to-moderate right hydroureteronephrosis to the level of mid right lumbar ureter is mildly increased. No left hydronephrosis. Contrast nephrograms are symmetric and within normal limits. Subcentimeter hypodense renal cortical lesions in the upper right kidney are too small to characterize and are stable. No new renal lesions. Bladder is collapsed with stable chronic mild diffuse bladder wall thickening and perivesical fat haziness. Stomach/Bowel: Normal non-distended stomach. Moderate right inguinal hernia containing multiple right pelvic small bowel loops, unchanged. No small bowel dilatation or wall thickening. Oral contrast transits to the colon. Appendix appears normal. Stable postsurgical changes from end sigmoid colostomy in the ventral left abdominal wall with no wall thickening or acute pericolonic fat stranding in the remnant large-bowel. Stable postsurgical and post treatment changes from abdominoperineal resection, with mildly heterogeneous soft tissue density in the presacral space measuring up to 3.4 cm thickness (series 2/image 113), stable. Vascular/Lymphatic: Atherosclerotic nonaneurysmal abdominal aorta. Patent portal, hepatic, splenic and renal veins. Aortocaval adenopathy measures up to 3.5 cm (series 2/image 77), decreased from 4.3 cm. No new adenopathy in the abdomen or pelvis. Reproductive: Stable top-normal size prostate. Other: No pneumoperitoneum. No ascites. No focal fluid collections. Heterogeneous hypodense right psoas muscle 11.2 x 10.6 cm  mass (series 2/image 80), previously 11.8 x 11.1 cm, not appreciably changed. Musculoskeletal: No discrete osseous lesions. Mild lumbar spondylosis. IMPRESSION: 1. Acute pulmonary embolism in the right lower lobe, extending proximally to the lobar branch. 2. Positive for acute PE with CT evidence of right  heart strain (RV/LV Ratio = 1.1) consistent with at least submassive (intermediate risk) PE. The presence of right heart strain has been associated with an increased risk of morbidity and mortality. 3. Overall substantial progression of metastatic disease, although there are mixed changes as detailed below. 4. Pulmonary metastases have significantly increased in size and number. 5. Liver metastases have significantly increased in size and number. 6. Left supraclavicular and retroperitoneal adenopathy is decreased. 7. Large right psoas muscle metastasis is stable. 8. Mild to moderate right hydroureteronephrosis is mildly increased, although there is no evidence of a delayed right contrast nephrogram, suggesting that the degree of ureteral obstruction is low-grade at this time. 9. Stable right inguinal hernia containing small bowel loops without bowel complication. 10. Stable postsurgical/post-treatment changes in the presacral space from APR. 11.  Aortic Atherosclerosis (ICD10-I70.0). Critical Value/emergent results were called by telephone at the time of interpretation on 04/10/2018 at 2:43 pm to Dr. Truitt Merle , who verbally acknowledged these results. Electronically Signed   By: Ilona Sorrel M.D.   On: 04/10/2018 14:47   Ct Abdomen Pelvis W Contrast  Result Date: 04/10/2018 CLINICAL DATA:  Stage IV rectal adenocarcinoma, presenting for restaging with ongoing chemotherapy. EXAM: CT CHEST, ABDOMEN, AND PELVIS WITH CONTRAST TECHNIQUE: Multidetector CT imaging of the chest, abdomen and pelvis was performed following the standard protocol during bolus administration of intravenous contrast. CONTRAST:  133mL OMNIPAQUE IOHEXOL 300 MG/ML  SOLN COMPARISON:  01/08/2018 CT chest, abdomen and pelvis. FINDINGS: CT CHEST FINDINGS Cardiovascular: Normal heart size. No significant pericardial effusion/thickening. Right internal jugular MediPort terminates at the cavoatrial junction. Great vessels are normal in course and  caliber. There is an acute pulmonary embolus to the right lower lobe involving lobar, segmental and subsegmental branches. Elevated RV/LV ratio 1.08. Mediastinum/Nodes: No discrete thyroid nodules. Unremarkable esophagus. Mildly enlarged 1.3 cm left supraclavicular node (series 2/image 6) is decreased from 2.5 cm. No axillary adenopathy. No mediastinal or hilar adenopathy. Lungs/Pleura: No pneumothorax. No pleural effusion. Numerous solid pulmonary masses and nodules throughout both lungs (greater than 30), significantly increased in size and number. For example, a 3.3 cm right middle lobe mass (series 9/image 129), increased from 1.8 cm. Basilar left lower lobe 2.3 cm nodule (series 9/image 136) is increased from 0.8 cm. Left upper lobe 1.9 cm nodule (series 9/image 49) is increased from 0.8 cm. Musculoskeletal:  No discrete focal osseous lesions in the chest. CT ABDOMEN PELVIS FINDINGS Hepatobiliary: Liver is enlarged by numerous (greater than 10) bulky heterogeneous hypoenhancing masses, significantly increased in size and number. For example, an 8.0 x 7.5 cm lateral segment left liver lobe mass (series 2/image 71), increased from 2.8 x 1.8 cm. Inferior right liver lobe 7.8 x 4.7 cm mass (series 2/image 74), increased from 2.1 x 2.1 cm. Right liver dome 9.7 x 8.3 cm mass (series 2/image 56), increased from 1.1 x 1.0 cm. Normal gallbladder with no radiopaque cholelithiasis. No biliary ductal dilatation. Pancreas: Normal, with no mass or duct dilation. Spleen: Normal size. No mass. Adrenals/Urinary Tract: No discrete adrenal nodules. Mild-to-moderate right hydroureteronephrosis to the level of mid right lumbar ureter is mildly increased. No left hydronephrosis. Contrast nephrograms are symmetric and within normal limits. Subcentimeter hypodense renal cortical lesions in  the upper right kidney are too small to characterize and are stable. No new renal lesions. Bladder is collapsed with stable chronic mild diffuse  bladder wall thickening and perivesical fat haziness. Stomach/Bowel: Normal non-distended stomach. Moderate right inguinal hernia containing multiple right pelvic small bowel loops, unchanged. No small bowel dilatation or wall thickening. Oral contrast transits to the colon. Appendix appears normal. Stable postsurgical changes from end sigmoid colostomy in the ventral left abdominal wall with no wall thickening or acute pericolonic fat stranding in the remnant large-bowel. Stable postsurgical and post treatment changes from abdominoperineal resection, with mildly heterogeneous soft tissue density in the presacral space measuring up to 3.4 cm thickness (series 2/image 113), stable. Vascular/Lymphatic: Atherosclerotic nonaneurysmal abdominal aorta. Patent portal, hepatic, splenic and renal veins. Aortocaval adenopathy measures up to 3.5 cm (series 2/image 77), decreased from 4.3 cm. No new adenopathy in the abdomen or pelvis. Reproductive: Stable top-normal size prostate. Other: No pneumoperitoneum. No ascites. No focal fluid collections. Heterogeneous hypodense right psoas muscle 11.2 x 10.6 cm mass (series 2/image 80), previously 11.8 x 11.1 cm, not appreciably changed. Musculoskeletal: No discrete osseous lesions. Mild lumbar spondylosis. IMPRESSION: 1. Acute pulmonary embolism in the right lower lobe, extending proximally to the lobar branch. 2. Positive for acute PE with CT evidence of right heart strain (RV/LV Ratio = 1.1) consistent with at least submassive (intermediate risk) PE. The presence of right heart strain has been associated with an increased risk of morbidity and mortality. 3. Overall substantial progression of metastatic disease, although there are mixed changes as detailed below. 4. Pulmonary metastases have significantly increased in size and number. 5. Liver metastases have significantly increased in size and number. 6. Left supraclavicular and retroperitoneal adenopathy is decreased. 7. Large  right psoas muscle metastasis is stable. 8. Mild to moderate right hydroureteronephrosis is mildly increased, although there is no evidence of a delayed right contrast nephrogram, suggesting that the degree of ureteral obstruction is low-grade at this time. 9. Stable right inguinal hernia containing small bowel loops without bowel complication. 10. Stable postsurgical/post-treatment changes in the presacral space from APR. 11.  Aortic Atherosclerosis (ICD10-I70.0). Critical Value/emergent results were called by telephone at the time of interpretation on 04/10/2018 at 2:43 pm to Dr. Truitt Merle , who verbally acknowledged these results. Electronically Signed   By: Ilona Sorrel M.D.   On: 04/10/2018 14:47        This case was discussed with Dr. Burr Medico. She expressed agreement with my management of this patient.  Addendum  I called pt back after his CT scan showed right lung PE. I have reviewed his restaging scan findings, which unfortunately showed significant disease progression in lungs and liver, but improved adenopathy. I will stop his current chemo due to overall significant disease progression.  Unfortunately, we are running out of standard effective chemotherapy options, and he disease is very aggressive, has progressed rapidly.  His overall performance status has also declined.  I have sent a message to Dr. Aleatha Borer at [facility] to see if they have clinical trial option for him.  I will also look for other clinical trial options in other institutions in [place].  He has mild dyspnea on moderate exertion.  He has developed a mild right thigh edema since this morning, and right lower extremity tightness for past 2 days, possible DVT.  We did 5 minutes walking, his oxygen level dropped to 83% on room air.  His CT scan also showed right heart strain.  I recommend hospital admission,  will send patient to [facility] ED for acute management. I will f/u in the hospital.  I spent a total of 40 mins for his visit today,  >50% on face to face counseling.  Truitt Merle  04/10/2018

## 2018-04-10 NOTE — ED Triage Notes (Signed)
Pt has hx of metastatic cancer.  Pt had a CT earlier today, that indicated he had right sided PE. Pt had shortness of breath, dipping up 90% ambulating on room air.

## 2018-04-11 ENCOUNTER — Ambulatory Visit: Payer: BLUE CROSS/BLUE SHIELD | Admitting: Hematology

## 2018-04-11 ENCOUNTER — Inpatient Hospital Stay (HOSPITAL_COMMUNITY): Payer: BLUE CROSS/BLUE SHIELD

## 2018-04-11 DIAGNOSIS — D61818 Other pancytopenia: Secondary | ICD-10-CM

## 2018-04-11 DIAGNOSIS — M7989 Other specified soft tissue disorders: Secondary | ICD-10-CM

## 2018-04-11 DIAGNOSIS — I361 Nonrheumatic tricuspid (valve) insufficiency: Secondary | ICD-10-CM

## 2018-04-11 DIAGNOSIS — I82409 Acute embolism and thrombosis of unspecified deep veins of unspecified lower extremity: Secondary | ICD-10-CM

## 2018-04-11 DIAGNOSIS — C779 Secondary and unspecified malignant neoplasm of lymph node, unspecified: Secondary | ICD-10-CM

## 2018-04-11 DIAGNOSIS — C787 Secondary malignant neoplasm of liver and intrahepatic bile duct: Secondary | ICD-10-CM

## 2018-04-11 DIAGNOSIS — C20 Malignant neoplasm of rectum: Secondary | ICD-10-CM

## 2018-04-11 DIAGNOSIS — D649 Anemia, unspecified: Secondary | ICD-10-CM

## 2018-04-11 DIAGNOSIS — C7800 Secondary malignant neoplasm of unspecified lung: Secondary | ICD-10-CM

## 2018-04-11 DIAGNOSIS — D696 Thrombocytopenia, unspecified: Secondary | ICD-10-CM

## 2018-04-11 DIAGNOSIS — R0902 Hypoxemia: Secondary | ICD-10-CM

## 2018-04-11 DIAGNOSIS — I2699 Other pulmonary embolism without acute cor pulmonale: Principal | ICD-10-CM

## 2018-04-11 DIAGNOSIS — E43 Unspecified severe protein-calorie malnutrition: Secondary | ICD-10-CM

## 2018-04-11 LAB — COMPREHENSIVE METABOLIC PANEL
ALT: 29 U/L (ref 0–44)
AST: 30 U/L (ref 15–41)
Albumin: 2.6 g/dL — ABNORMAL LOW (ref 3.5–5.0)
Alkaline Phosphatase: 599 U/L — ABNORMAL HIGH (ref 38–126)
Anion gap: 10 (ref 5–15)
BUN: 20 mg/dL (ref 8–23)
CHLORIDE: 103 mmol/L (ref 98–111)
CO2: 21 mmol/L — AB (ref 22–32)
CREATININE: 0.83 mg/dL (ref 0.61–1.24)
Calcium: 8 mg/dL — ABNORMAL LOW (ref 8.9–10.3)
GFR calc Af Amer: >60 mL/min (ref >60)
Glucose, Bld: 110 mg/dL — ABNORMAL HIGH (ref 70–99)
Potassium: 4 mmol/L (ref 3.5–5.1)
SODIUM: 134 mmol/L — AB (ref 135–145)
Total Bilirubin: 0.7 mg/dL (ref 0.3–1.2)
Total Protein: 5.5 g/dL — ABNORMAL LOW (ref 6.5–8.1)

## 2018-04-11 LAB — APTT: aPTT: 39 seconds — ABNORMAL HIGH (ref 24–36)

## 2018-04-11 LAB — CBC
HCT: 22.8 % — ABNORMAL LOW (ref 39.0–52.0)
HEMATOCRIT: 21.2 % — AB (ref 39.0–52.0)
HEMOGLOBIN: 7.6 g/dL — AB (ref 13.0–17.0)
HEMOGLOBIN: 6.9 g/dL — AB (ref 13.0–17.0)
MCH: 31.7 pg (ref 26.0–34.0)
MCH: 31.8 pg (ref 26.0–34.0)
MCHC: 32.5 g/dL (ref 30.0–36.0)
MCHC: 33.3 g/dL (ref 30.0–36.0)
MCV: 95 fL (ref 78.0–100.0)
MCV: 97.7 fL (ref 78.0–100.0)
PLATELETS: 58 10*3/uL — AB (ref 150–400)
Platelets: 65 10*3/uL — ABNORMAL LOW (ref 150–400)
RBC: 2.4 MIL/uL — ABNORMAL LOW (ref 4.22–5.81)
RBC: 2.17 MIL/uL — AB (ref 4.22–5.81)
RDW: 17 % — ABNORMAL HIGH (ref 11.5–15.5)
RDW: 17.8 % — ABNORMAL HIGH (ref 11.5–15.5)
WBC: 6.6 10*3/uL (ref 4.0–10.5)
WBC: 9.8 10*3/uL (ref 4.0–10.5)

## 2018-04-11 LAB — PROTIME-INR
INR: 1.39
Prothrombin Time: 16.9 seconds — ABNORMAL HIGH (ref 11.4–15.2)

## 2018-04-11 LAB — HEPARIN LEVEL (UNFRACTIONATED)
HEPARIN UNFRACTIONATED: 0.96 [IU]/mL — AB (ref 0.30–0.70)
Heparin Unfractionated: <0.1 IU/mL — ABNORMAL LOW (ref 0.30–0.70)

## 2018-04-11 LAB — ECHOCARDIOGRAM COMPLETE
Height: 72 in
Weight: 1980.79 oz

## 2018-04-11 LAB — TROPONIN I
TROPONIN I: 0.03 ng/mL — AB (ref <0.03)
TROPONIN I: 0.03 ng/mL — AB (ref <0.03)

## 2018-04-11 LAB — TSH: TSH: 2.877 u[IU]/mL (ref 0.350–4.500)

## 2018-04-11 LAB — MAGNESIUM: Magnesium: 1.8 mg/dL (ref 1.7–2.4)

## 2018-04-11 LAB — PREPARE RBC (CROSSMATCH)

## 2018-04-11 LAB — PHOSPHORUS: Phosphorus: 1.1 mg/dL — ABNORMAL LOW (ref 2.5–4.6)

## 2018-04-11 MED ORDER — SODIUM CHLORIDE 0.9% IV SOLUTION
Freq: Once | INTRAVENOUS | Status: AC
  Administered 2018-04-11: 03:00:00 via INTRAVENOUS

## 2018-04-11 MED ORDER — IOPAMIDOL (ISOVUE-370) INJECTION 76%
INTRAVENOUS | Status: AC
  Filled 2018-04-11: qty 200

## 2018-04-11 MED ORDER — SODIUM CHLORIDE 0.9% IV SOLUTION
Freq: Once | INTRAVENOUS | Status: AC
  Administered 2018-04-11: 12:00:00 via INTRAVENOUS

## 2018-04-11 MED ORDER — LIDOCAINE HCL 1 % IJ SOLN
INTRAMUSCULAR | Status: AC
  Filled 2018-04-11: qty 20

## 2018-04-11 NOTE — Progress Notes (Signed)
David Clarke   DOB:1956/07/22   NL#:[id number]   XTK#:[id number]  Oncology follow up   Subjective: Patient is well-known to me, under my care for his metastatic colon cancer.  He was admitted from my office yesterday due to acute PE and hypoxia.  He received 1 unit of blood transfusion last night.  No bleeding.  He overall feels better today.   Objective:  Vitals:   04/11/18 0503 04/11/18 1502  BP: 137/83 124/79  Pulse: 75 71  Resp: 16   Temp:  99.3 F (37.4 C)  SpO2: 100% 100%    Body mass index is 16.79 kg/m.  Intake/Output Summary (Last 24 hours) at 04/11/2018 1800 Last data filed at 04/11/2018 1512 Gross per 24 hour  Intake 1469.97 ml  Output 715 ml  Net 754.97 ml     Sclerae unicteric  Oropharynx clear  No peripheral adenopathy  Lungs clear -- no rales or rhonchi  Heart regular rate and rhythm  Abdomen benign  MSK no focal spinal tenderness, no peripheral edema  Neuro nonfocal    CBG (last 3)  No results for input(s): GLUCAP in the last 72 hours.   Labs:  Lab Results  Component Value Date   WBC 6.6 04/11/2018   HGB 7.6 (L) 04/11/2018   HCT 22.8 (L) 04/11/2018   MCV 95.0 04/11/2018   PLT 58 (L) 04/11/2018   NEUTROABS 12.4 (H) 04/10/2018    CMP Latest Ref Rng & Units 04/11/2018 04/10/2018 04/10/2018  Glucose 70 - 99 mg/dL 110(H) 111(H) 113(H)  BUN 8 - 23 mg/dL 20 20 22   Creatinine 0.61 - 1.24 mg/dL 0.83 0.80 0.89  Sodium 135 - 145 mmol/L 134(L) 131(L) 134(L)  Potassium 3.5 - 5.1 mmol/L 4.0 4.2 4.3  Chloride 98 - 111 mmol/L 103 99 101  CO2 22 - 32 mmol/L 21(L) - 21(L)  Calcium 8.9 - 10.3 mg/dL 8.0(L) - 8.4(L)  Total Protein 6.5 - 8.1 g/dL 5.5(L) - 6.1(L)  Total Bilirubin 0.3 - 1.2 mg/dL 0.7 - 0.8  Alkaline Phos 38 - 126 U/L 599(H) - 685(H)  AST 15 - 41 U/L 30 - 33  ALT 0 - 44 U/L 29 - 35    Urine Studies No results for input(s): UHGB, CRYS in the last 72 hours.  Invalid input(s): UACOL, UAPR, USPG, UPH, UTP, UGL, UKET, UBIL, UNIT, UROB, ULEU, UEPI,  UWBC, URBC, UBAC, CAST, UCOM, BILUA  Basic Metabolic Panel: Recent Labs  Lab 04/10/18 1705 04/10/18 1728 04/11/18 0034  NA 134* 131* 134*  K 4.3 4.2 4.0  CL 101 99 103  CO2 21*  --  21*  GLUCOSE 113* 111* 110*  BUN 22 20 20   CREATININE 0.89 0.80 0.83  CALCIUM 8.4*  --  8.0*  MG  --   --  1.8  PHOS  --   --  1.1*   GFR Estimated Creatinine Clearance: 73.4 mL/min (by C-G formula based on SCr of 0.83 mg/dL). Liver Function Tests: Recent Labs  Lab 04/10/18 1705 04/11/18 0034  AST 33 30  ALT 35 29  ALKPHOS 685* 599*  BILITOT 0.8 0.7  PROT 6.1* 5.5*  ALBUMIN 2.9* 2.6*   No results for input(s): LIPASE, AMYLASE in the last 168 hours. No results for input(s): AMMONIA in the last 168 hours. Coagulation profile Recent Labs  Lab 04/11/18 0034  INR 1.39    CBC: Recent Labs  Lab 04/10/18 1705 04/10/18 1728 04/11/18 0034 04/11/18 1605  WBC 13.1*  --  9.8 6.6  NEUTROABS  12.4*  --   --   --   HGB 7.7* 7.5* 6.9* 7.6*  HCT 23.6* 22.0* 21.2* 22.8*  MCV 98.3  --  97.7 95.0  PLT 72*  --  65* 58*   Cardiac Enzymes: Recent Labs  Lab 04/10/18 1937 04/11/18 0034 04/11/18 0732  TROPONINI 0.03* 0.03* 0.03*   BNP: Invalid input(s): POCBNP CBG: No results for input(s): GLUCAP in the last 168 hours. D-Dimer No results for input(s): DDIMER in the last 72 hours. Hgb A1c No results for input(s): HGBA1C in the last 72 hours. Lipid Profile No results for input(s): CHOL, HDL, LDLCALC, TRIG, CHOLHDL, LDLDIRECT in the last 72 hours. Thyroid function studies Recent Labs    04/11/18 0034  TSH 2.877   Anemia work up No results for input(s): VITAMINB12, FOLATE, FERRITIN, TIBC, IRON, RETICCTPCT in the last 72 hours. Microbiology No results found for this or any previous visit (from the past 240 hour(s)).    Studies:  Ct Chest W Contrast  Result Date: 04/10/2018 CLINICAL DATA:  Stage IV rectal adenocarcinoma, presenting for restaging with ongoing chemotherapy. EXAM: CT  CHEST, ABDOMEN, AND PELVIS WITH CONTRAST TECHNIQUE: Multidetector CT imaging of the chest, abdomen and pelvis was performed following the standard protocol during bolus administration of intravenous contrast. CONTRAST:  1106mL OMNIPAQUE IOHEXOL 300 MG/ML  SOLN COMPARISON:  01/08/2018 CT chest, abdomen and pelvis. FINDINGS: CT CHEST FINDINGS Cardiovascular: Normal heart size. No significant pericardial effusion/thickening. Right internal jugular MediPort terminates at the cavoatrial junction. Great vessels are normal in course and caliber. There is an acute pulmonary embolus to the right lower lobe involving lobar, segmental and subsegmental branches. Elevated RV/LV ratio 1.08. Mediastinum/Nodes: No discrete thyroid nodules. Unremarkable esophagus. Mildly enlarged 1.3 cm left supraclavicular node (series 2/image 6) is decreased from 2.5 cm. No axillary adenopathy. No mediastinal or hilar adenopathy. Lungs/Pleura: No pneumothorax. No pleural effusion. Numerous solid pulmonary masses and nodules throughout both lungs (greater than 30), significantly increased in size and number. For example, a 3.3 cm right middle lobe mass (series 9/image 129), increased from 1.8 cm. Basilar left lower lobe 2.3 cm nodule (series 9/image 136) is increased from 0.8 cm. Left upper lobe 1.9 cm nodule (series 9/image 49) is increased from 0.8 cm. Musculoskeletal:  No discrete focal osseous lesions in the chest. CT ABDOMEN PELVIS FINDINGS Hepatobiliary: Liver is enlarged by numerous (greater than 10) bulky heterogeneous hypoenhancing masses, significantly increased in size and number. For example, an 8.0 x 7.5 cm lateral segment left liver lobe mass (series 2/image 71), increased from 2.8 x 1.8 cm. Inferior right liver lobe 7.8 x 4.7 cm mass (series 2/image 74), increased from 2.1 x 2.1 cm. Right liver dome 9.7 x 8.3 cm mass (series 2/image 56), increased from 1.1 x 1.0 cm. Normal gallbladder with no radiopaque cholelithiasis. No biliary  ductal dilatation. Pancreas: Normal, with no mass or duct dilation. Spleen: Normal size. No mass. Adrenals/Urinary Tract: No discrete adrenal nodules. Mild-to-moderate right hydroureteronephrosis to the level of mid right lumbar ureter is mildly increased. No left hydronephrosis. Contrast nephrograms are symmetric and within normal limits. Subcentimeter hypodense renal cortical lesions in the upper right kidney are too small to characterize and are stable. No new renal lesions. Bladder is collapsed with stable chronic mild diffuse bladder wall thickening and perivesical fat haziness. Stomach/Bowel: Normal non-distended stomach. Moderate right inguinal hernia containing multiple right pelvic small bowel loops, unchanged. No small bowel dilatation or wall thickening. Oral contrast transits to the colon. Appendix appears normal. Stable  postsurgical changes from end sigmoid colostomy in the ventral left abdominal wall with no wall thickening or acute pericolonic fat stranding in the remnant large-bowel. Stable postsurgical and post treatment changes from abdominoperineal resection, with mildly heterogeneous soft tissue density in the presacral space measuring up to 3.4 cm thickness (series 2/image 113), stable. Vascular/Lymphatic: Atherosclerotic nonaneurysmal abdominal aorta. Patent portal, hepatic, splenic and renal veins. Aortocaval adenopathy measures up to 3.5 cm (series 2/image 77), decreased from 4.3 cm. No new adenopathy in the abdomen or pelvis. Reproductive: Stable top-normal size prostate. Other: No pneumoperitoneum. No ascites. No focal fluid collections. Heterogeneous hypodense right psoas muscle 11.2 x 10.6 cm mass (series 2/image 80), previously 11.8 x 11.1 cm, not appreciably changed. Musculoskeletal: No discrete osseous lesions. Mild lumbar spondylosis. IMPRESSION: 1. Acute pulmonary embolism in the right lower lobe, extending proximally to the lobar branch. 2. Positive for acute PE with CT evidence of  right heart strain (RV/LV Ratio = 1.1) consistent with at least submassive (intermediate risk) PE. The presence of right heart strain has been associated with an increased risk of morbidity and mortality. 3. Overall substantial progression of metastatic disease, although there are mixed changes as detailed below. 4. Pulmonary metastases have significantly increased in size and number. 5. Liver metastases have significantly increased in size and number. 6. Left supraclavicular and retroperitoneal adenopathy is decreased. 7. Large right psoas muscle metastasis is stable. 8. Mild to moderate right hydroureteronephrosis is mildly increased, although there is no evidence of a delayed right contrast nephrogram, suggesting that the degree of ureteral obstruction is low-grade at this time. 9. Stable right inguinal hernia containing small bowel loops without bowel complication. 10. Stable postsurgical/post-treatment changes in the presacral space from APR. 11.  Aortic Atherosclerosis (ICD10-I70.0). Critical Value/emergent results were called by telephone at the time of interpretation on 04/10/2018 at 2:43 pm to Dr. Truitt Merle , who verbally acknowledged these results. Electronically Signed   By: Ilona Sorrel M.D.   On: 04/10/2018 14:47   Ct Abdomen Pelvis W Contrast  Result Date: 04/10/2018 CLINICAL DATA:  Stage IV rectal adenocarcinoma, presenting for restaging with ongoing chemotherapy. EXAM: CT CHEST, ABDOMEN, AND PELVIS WITH CONTRAST TECHNIQUE: Multidetector CT imaging of the chest, abdomen and pelvis was performed following the standard protocol during bolus administration of intravenous contrast. CONTRAST:  178mL OMNIPAQUE IOHEXOL 300 MG/ML  SOLN COMPARISON:  01/08/2018 CT chest, abdomen and pelvis. FINDINGS: CT CHEST FINDINGS Cardiovascular: Normal heart size. No significant pericardial effusion/thickening. Right internal jugular MediPort terminates at the cavoatrial junction. Great vessels are normal in course and  caliber. There is an acute pulmonary embolus to the right lower lobe involving lobar, segmental and subsegmental branches. Elevated RV/LV ratio 1.08. Mediastinum/Nodes: No discrete thyroid nodules. Unremarkable esophagus. Mildly enlarged 1.3 cm left supraclavicular node (series 2/image 6) is decreased from 2.5 cm. No axillary adenopathy. No mediastinal or hilar adenopathy. Lungs/Pleura: No pneumothorax. No pleural effusion. Numerous solid pulmonary masses and nodules throughout both lungs (greater than 30), significantly increased in size and number. For example, a 3.3 cm right middle lobe mass (series 9/image 129), increased from 1.8 cm. Basilar left lower lobe 2.3 cm nodule (series 9/image 136) is increased from 0.8 cm. Left upper lobe 1.9 cm nodule (series 9/image 49) is increased from 0.8 cm. Musculoskeletal:  No discrete focal osseous lesions in the chest. CT ABDOMEN PELVIS FINDINGS Hepatobiliary: Liver is enlarged by numerous (greater than 10) bulky heterogeneous hypoenhancing masses, significantly increased in size and number. For example, an 8.0 x 7.5  cm lateral segment left liver lobe mass (series 2/image 71), increased from 2.8 x 1.8 cm. Inferior right liver lobe 7.8 x 4.7 cm mass (series 2/image 74), increased from 2.1 x 2.1 cm. Right liver dome 9.7 x 8.3 cm mass (series 2/image 56), increased from 1.1 x 1.0 cm. Normal gallbladder with no radiopaque cholelithiasis. No biliary ductal dilatation. Pancreas: Normal, with no mass or duct dilation. Spleen: Normal size. No mass. Adrenals/Urinary Tract: No discrete adrenal nodules. Mild-to-moderate right hydroureteronephrosis to the level of mid right lumbar ureter is mildly increased. No left hydronephrosis. Contrast nephrograms are symmetric and within normal limits. Subcentimeter hypodense renal cortical lesions in the upper right kidney are too small to characterize and are stable. No new renal lesions. Bladder is collapsed with stable chronic mild diffuse  bladder wall thickening and perivesical fat haziness. Stomach/Bowel: Normal non-distended stomach. Moderate right inguinal hernia containing multiple right pelvic small bowel loops, unchanged. No small bowel dilatation or wall thickening. Oral contrast transits to the colon. Appendix appears normal. Stable postsurgical changes from end sigmoid colostomy in the ventral left abdominal wall with no wall thickening or acute pericolonic fat stranding in the remnant large-bowel. Stable postsurgical and post treatment changes from abdominoperineal resection, with mildly heterogeneous soft tissue density in the presacral space measuring up to 3.4 cm thickness (series 2/image 113), stable. Vascular/Lymphatic: Atherosclerotic nonaneurysmal abdominal aorta. Patent portal, hepatic, splenic and renal veins. Aortocaval adenopathy measures up to 3.5 cm (series 2/image 77), decreased from 4.3 cm. No new adenopathy in the abdomen or pelvis. Reproductive: Stable top-normal size prostate. Other: No pneumoperitoneum. No ascites. No focal fluid collections. Heterogeneous hypodense right psoas muscle 11.2 x 10.6 cm mass (series 2/image 80), previously 11.8 x 11.1 cm, not appreciably changed. Musculoskeletal: No discrete osseous lesions. Mild lumbar spondylosis. IMPRESSION: 1. Acute pulmonary embolism in the right lower lobe, extending proximally to the lobar branch. 2. Positive for acute PE with CT evidence of right heart strain (RV/LV Ratio = 1.1) consistent with at least submassive (intermediate risk) PE. The presence of right heart strain has been associated with an increased risk of morbidity and mortality. 3. Overall substantial progression of metastatic disease, although there are mixed changes as detailed below. 4. Pulmonary metastases have significantly increased in size and number. 5. Liver metastases have significantly increased in size and number. 6. Left supraclavicular and retroperitoneal adenopathy is decreased. 7. Large  right psoas muscle metastasis is stable. 8. Mild to moderate right hydroureteronephrosis is mildly increased, although there is no evidence of a delayed right contrast nephrogram, suggesting that the degree of ureteral obstruction is low-grade at this time. 9. Stable right inguinal hernia containing small bowel loops without bowel complication. 10. Stable postsurgical/post-treatment changes in the presacral space from APR. 11.  Aortic Atherosclerosis (ICD10-I70.0). Critical Value/emergent results were called by telephone at the time of interpretation on 04/10/2018 at 2:43 pm to Dr. Truitt Merle , who verbally acknowledged these results. Electronically Signed   By: Ilona Sorrel M.D.   On: 04/10/2018 14:47    Assessment: 62 y.o. with metastatic colon cancer, on chemo, was admitted for DVT/PE and pancrytopenia   1. Acute DVT/PE with right heart strain  2. 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 Moderate thrombocytopenia, no clinical bleeding  4.  Severe calorie and protein malnutrition 5.  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  -if his plt recovers adequately, he can be discharged with Xarelto (loading dose 15mg  bid for 3 weeks followed by 20mg  daily).  We also discussed the option of Lovenox injection.  Since we now have more data of Xarelto in cancer related thrombosis, it's certainly a more convenient option for patients. -if Hb remains to be <7.5, consider 1-2 more units blood transfusion  -we discussed code status with pt and his wife and daughter, given his recent significant disease progression.  I recommend DNR and DNI, he will think about it.  -Unfortunately his cancer has progressed rapidly lately, and he has tried most effective chemo regiments for his colon cancer, I have very limited treatment options  now. I will look for clinical trials in other institution in [place], for him. He is interested.  However he understands any withdrawal will require good performance status and organ functions, and his options may be limited.  I certainly hope his performance status we improved after I stop his chemo, and treating his thrombosis  -I will f/u    Truitt Merle, MD 04/11/2018  6:00 PM

## 2018-04-11 NOTE — Progress Notes (Signed)
[place] for heparin IV Indication: pulmonary embolus  Allergies  Allergen Reactions  . Sulfa Antibiotics Nausea Only and Other (See Comments)    Reaction may years ago, pt thinks nausea only.     Patient Measurements: Height: 6' (182.9 cm) Weight: 123 lb 12.8 oz (56.2 kg) IBW/kg (Calculated) : 77.6 Heparin Dosing Weight: TBW  Vital Signs: Temp: 98.5 F (36.9 C) (08/15 0503) Temp Source: Oral (08/15 0503) BP: 137/83 (08/15 0503) Pulse Rate: 75 (08/15 0503)  Labs: Recent Labs    04/10/18 1705 04/10/18 1728 04/10/18 1937 04/11/18 0034 04/11/18 0732 04/11/18 1200  HGB 7.7* 7.5*  --  6.9*  --   --   HCT 23.6* 22.0*  --  21.2*  --   --   PLT 72*  --   --  65*  --   --   APTT  --   --   --  39*  --   --   LABPROT  --   --   --  16.9*  --   --   INR  --   --   --  1.39  --   --   HEPARINUNFRC  --   --   --   --   --  0.96*  CREATININE 0.89 0.80  --  0.83  --   --   TROPONINI  --   --  0.03* 0.03* 0.03*  --     Estimated Creatinine Clearance: 73.4 mL/min (by C-G formula based on SCr of 0.83 mg/dL).   Medical History: Past Medical History:  Diagnosis Date  . Allergy   . Cancer ([place]) 10/09/2016   rectal adenocarcinoma  . Inguinal hernia     Medications:  Facility-Administered Medications Prior to Admission  Medication Dose Route Frequency Provider Last Rate Last Dose  . 0.9 %  sodium chloride infusion  500 mL Intravenous Continuous Pyrtle, Lajuan Lines, MD      . 0.9 %  sodium chloride infusion  500 mL Intravenous Once Pyrtle, Lajuan Lines, MD       Medications Prior to Admission  Medication Sig Dispense Refill Last Dose  . Cannabinoids (THC FREE PO) Inhale into the lungs. THC Oil via vape as needed for sleep   04/09/2018 at Unknown time  . clindamycin (CLINDAGEL) 1 % gel Apply topically 2 (two) times daily. 60 g 1 04/10/2018 at Unknown time  . ibuprofen (ADVIL,MOTRIN) 200 MG tablet Take 200-600 mg by mouth daily as needed for moderate  pain.   Past Week at Unknown time  . magnesium oxide (MAG-OX) 400 (241.3 Mg) MG tablet Take 1 tablet (400 mg total) by mouth 2 (two) times daily. 60 tablet 1 Past Week at Unknown time  . Multiple Vitamin (MULTIVITAMIN WITH MINERALS) TABS tablet Take 1 tablet by mouth every other day.    Past Week at Unknown time  . nystatin (MYCOSTATIN) 100000 UNIT/ML suspension Take 5 mLs (500,000 Units total) by mouth 4 (four) times daily. 473 mL 0 04/09/2018  . omeprazole (PRILOSEC) 40 MG capsule Take 1 capsule (40 mg total) by mouth daily. 30 capsule 2 04/09/2018 at Unknown time  . potassium chloride SA (K-DUR,KLOR-CON) 20 MEQ tablet Take 1 tablet (20 mEq total) by mouth 2 (two) times daily. 60 tablet 1 Past Week at Unknown time  . dexamethasone (DECADRON) 4 MG tablet Take 1 tablet (4 mg total) by mouth daily. Start the day after chemo for 3-5 days. (Patient not taking: Reported on 04/10/2018)  10 tablet 2 Not Taking at Unknown time  . ondansetron (ZOFRAN) 8 MG tablet Take 1 tablet (8 mg total) by mouth 2 (two) times daily as needed for refractory nausea / vomiting. Start on day 3 after chemotherapy. (Patient not taking: Reported on 04/04/2018) 30 tablet 1 Not Taking at Unknown time  . prochlorperazine (COMPAZINE) 10 MG tablet Take 1 tablet (10 mg total) by mouth every 6 (six) hours as needed (NAUSEA). (Patient not taking: Reported on 04/04/2018) 30 tablet 1 Not Taking at Unknown time   Scheduled:  . feeding supplement (ENSURE ENLIVE)  237 mL Oral BID BM  . magnesium oxide  400 mg Oral BID  . nystatin  5 mL Oral QID  . pantoprazole  40 mg Oral Daily   PRN: acetaminophen **OR** acetaminophen, ondansetron **OR** ondansetron (ZOFRAN) IV, sodium chloride flush  Assessment: 6 yoM with PMH metastatic colon cancer, rectal adenocarcinoma with last chemo given 04/04/18, and neuropathy. Found to have new PE. Given Lovenox 1 mg/kg x 1 in ED, but oncologist would prefer patient on heparin given low Hgb/Plt from recent  chemo.   Baseline INR, aPTT: WNL  Prior anticoagulation: none PTA; Lovenox 55 mg given at 1730 on 8/14  Significant events:  Today, 04/11/2018:  HL 0.96, supra-therapeutic on 1000 units/hr  Plts 65  No bleeding or infusion issues per nursing  CrCl: 73 ml/min  Goal of Therapy: Heparin level 0.3-0.7 units/ml Monitor platelets by anticoagulation protocol: Yes  Plan:  Decrease heparin drip to 900 units/hr  Check heparin level 6 hrs   Daily CBC, daily heparin level once stable  Monitor for signs of bleeding or thrombosis   Dolly Rias RPh 04/11/2018, 1:21 PM Pager [phone]

## 2018-04-11 NOTE — Progress Notes (Signed)
  Echocardiogram 2D Echocardiogram has been performed.  David Clarke F 04/11/2018, 3:42 PM

## 2018-04-11 NOTE — Progress Notes (Signed)
Initial Nutrition Assessment  DOCUMENTATION CODES:   Severe malnutrition in context of chronic illness, Underweight  INTERVENTION:  - Will order [employer] Instant Breakfast once/day with soy milk - Continue to encourage PO intakes.  - Diet liberalization from Heart Healthy to Regular.  NUTRITION DIAGNOSIS:   Severe Malnutrition related to chronic illness, cancer and cancer related treatments as evidenced by severe fat depletion, severe muscle depletion.  GOAL:   Patient will meet greater than or equal to 90% of their needs  MONITOR:   PO intake, Weight trends, Labs  REASON FOR ASSESSMENT:   Malnutrition Screening Tool  ASSESSMENT:   62 y.o. male with medical history significant of metastatic colon cancer rectal adenocarcinoma and neuropathy. He presented to the ED from his oncologist's office with right leg swelling and pain and SOB. He initially felt right leg pain was secondary to neuropathy but restaging CT scan done 8/14 showed PE was progression of metastatic disease. CT scan also showed right heart strain. Patient endorses numbness and pain in right anterior lower extremity and stiffness pain shooting down his legs associated with some swelling he has been started on physical therapy 2 to 3 weeks ago but symptoms have started to get worse over the past few days. Reports leg tightness and thigh swelling.  Patient and wife in the room and both provide information. No intakes documented since admission. Patient reports eating a good breakfast which included hard boiled eggs. He states that appetite sometimes is "flat" which wife clarifies as patient often experiences poor appetite for a few days following each chemo treatment. He has also had several recurrences of oral thrush, but has found items he can tolerate well and he has been "pushing through" these periods.   He reports that he knows the importance of good nutrition, especially through treatment, and that he has made this  a primary focus. He experiences early satiety so he eats small portions throughout the day rather than larger meals. Wife reports that adequate fluid intake is difficult but that they have actively been working on this. Patient and wife very interested in diet liberalization and wife encouraged to bring in items that patient likes and tolerates well.   Patient reports that since being dx with cancer he has lost 25 lb. His goal is to gain this weight back. Per chart review, weight has been stable the past 2 months. He has lost 13 lb (9% body weight) since 5/23.   Medications reviewed; 400 mg Mag-ox BID, 5 mL Mycostatin QID, 40 mg oral Protonix/day.   Labs reviewed; Na: 134 mmol/L, BUN: 110 mg/dL, Ca: 8 mg/dL, Alk Phos elevated, Phos: 1.1 mg/dL.      NUTRITION - FOCUSED PHYSICAL EXAM:    Most Recent Value  Orbital Region  Moderate depletion  Upper Arm Region  Severe depletion  Thoracic and Lumbar Region  Unable to assess  Buccal Region  Moderate depletion  Temple Region  Unable to assess  Clavicle Bone Region  Severe depletion  Clavicle and Acromion Bone Region  Severe depletion  Scapular Bone Region  Unable to assess  Dorsal Hand  Moderate depletion  Patellar Region  Unable to assess  Anterior Thigh Region  Unable to assess  Posterior Calf Region  Unable to assess  Edema (RD Assessment)  Unable to assess  Hair  Reviewed  Eyes  Reviewed  Mouth  Reviewed  Skin  Reviewed  Nails  Reviewed       Diet Order:   Diet Order  Diet regular Room service appropriate? Yes; Fluid consistency: Thin  Diet effective now              EDUCATION NEEDS:   No education needs have been identified at this time  Skin:  Skin Assessment: Reviewed RN Assessment  Last BM:  PTA/unknown  Height:   Ht Readings from Last 1 Encounters:  04/10/18 6' (1.829 m)    Weight:   Wt Readings from Last 1 Encounters:  04/10/18 56.2 kg    Ideal Body Weight:  80.91 kg  BMI:  Body mass  index is 16.79 kg/m.  Estimated Nutritional Needs:   Kcal:  9381-0175  Protein:  100-110 grams  Fluid:  >/= 2 L/day     Jarome Matin, MS, RD, LDN, [facility] Inpatient Clinical Dietitian Pager # [phone] After hours/weekend pager # [phone]

## 2018-04-11 NOTE — Progress Notes (Signed)
Preliminary notes--Bilateral lower extremities venous duplex exam completed. Positive for acute DVT involving right Saphenous Femoral Junction, Posterior tibial veins, left posterior tibial veins and peroneal veins, superficial vein thrombosis seen at right great saphenous vein. Result notified RN [name]. David Clarke (RDMS RVT) 04/11/18 9:20 AM

## 2018-04-11 NOTE — Progress Notes (Signed)
PROGRESS NOTE [facility]   David Clarke   AST:[id number] DOB: [birth year]  DOA: 04/10/2018 PCP: Robyn Haber, MD   Brief Narrative:  David Clarke patient is a 62 year old male with medical history significant for metastatic rectal adenocarcinoma and neuropathy who presented to the emergency department after being sent by the oncology office with right leg pain and a CT that showed PE and progression of metastatic disease.  Patient was admitted with working diagnosis of PE and started on heparin drip.  Subjective: Patient seen and examined, he is feeling well this morning.  Denies chest pain and shortness of breath.  Mild R calf tenderness.  No acute events overnight.  Assessment & Plan: Acute PE/DVT  Provoke duet malignancy and immobility. CT chest shows acute RLL embolism with right heart strain. Doppler LE shows R DVT at the saphenous femoral junction. Agree with heparin ggt for now, with treat for total of 48 hrs, then switch to NOAC vs Lovenox. Patient is thrombocytopenic will defer to oncology choice of a/c. Check ECHO. Bedrest today, increase activities slowly in AM.   Anemia of chronic disease Patient was transfused 1 unit of PRBCs.  Check hemoglobin level.  No signs of overt bleeding.  Thrombocytopenia Likely related to chemotherapy, no signs of bleeding at this time.  Continue to monitor closely as patient is on heparin drip which can affect platelet levels.  Rectal adenocarcinoma New CT scans shows progressive disease.  Oncology aware.  Management per Dr. Burr Medico   DVT prophylaxis: Heparin ggt Code Status: Full code Family Communication: None at bedside Disposition Plan: Home in 24 to 48 hours after work-up has been completed.  Consultants:   Oncology   Procedures:   None   Antimicrobials:  None    Objective: Vitals:   04/11/18 0233 04/11/18 0310 04/11/18 0503 04/11/18 0503  BP: 137/84 (!) 122/91 137/83 137/83  Pulse: 77 78 76 75  Resp: 17 18 16 16     Temp: 98.1 F (36.7 C) 97.9 F (36.6 C) 98.5 F (36.9 C)   TempSrc: Oral Oral Oral   SpO2: 99% 100% 100% 100%  Weight:      Height:        Intake/Output Summary (Last 24 hours) at 04/11/2018 1330 Last data filed at 04/11/2018 1146 Gross per 24 hour  Intake 1408.14 ml  Output 715 ml  Net 693.14 ml   Filed Weights   04/10/18 1610  Weight: 56.2 kg    Examination:  General exam: Appears calm and comfortable  HEENT: OP moist and clear, marked acne Respiratory system: Clear to auscultation. No wheezes,crackle or rhonchi. Port in R chest  Cardiovascular system: S1 & S2 heard, RRR. No JVD, murmurs, rubs or gallops Gastrointestinal system: Abdomen is nondistended, soft and nontender. No organomegaly or masses Central nervous system: Alert and oriented. No focal neurological deficits. Extremities: No pedal edema.  Mild calf tenderness Skin: Multiple small pustules along b/l lower extremities  Psychiatry: Mood & affect appropriate.    Data Reviewed: I have personally reviewed following labs and imaging studies  CBC: Recent Labs  Lab 04/10/18 1705 04/10/18 1728 04/11/18 0034  WBC 13.1*  --  9.8  NEUTROABS 12.4*  --   --   HGB 7.7* 7.5* 6.9*  HCT 23.6* 22.0* 21.2*  MCV 98.3  --  97.7  PLT 72*  --  65*   Basic Metabolic Panel: Recent Labs  Lab 04/10/18 1705 04/10/18 1728 04/11/18 0034  NA 134* 131* 134*  K 4.3 4.2 4.0  CL 101 99 103  CO2 21*  --  21*  GLUCOSE 113* 111* 110*  BUN 22 20 20   CREATININE 0.89 0.80 0.83  CALCIUM 8.4*  --  8.0*  MG  --   --  1.8  PHOS  --   --  1.1*   GFR: Estimated Creatinine Clearance: 73.4 mL/min (by C-G formula based on SCr of 0.83 mg/dL). Liver Function Tests: Recent Labs  Lab 04/10/18 1705 04/11/18 0034  AST 33 30  ALT 35 29  ALKPHOS 685* 599*  BILITOT 0.8 0.7  PROT 6.1* 5.5*  ALBUMIN 2.9* 2.6*   No results for input(s): LIPASE, AMYLASE in the last 168 hours. No results for input(s): AMMONIA in the last 168  hours. Coagulation Profile: Recent Labs  Lab 04/11/18 0034  INR 1.39   Cardiac Enzymes: Recent Labs  Lab 04/10/18 1937 04/11/18 0034 04/11/18 0732  TROPONINI 0.03* 0.03* 0.03*   BNP (last 3 results) No results for input(s): PROBNP in the last 8760 hours. HbA1C: No results for input(s): HGBA1C in the last 72 hours. CBG: No results for input(s): GLUCAP in the last 168 hours. Lipid Profile: No results for input(s): CHOL, HDL, LDLCALC, TRIG, CHOLHDL, LDLDIRECT in the last 72 hours. Thyroid Function Tests: Recent Labs    04/11/18 0034  TSH 2.877   Anemia Panel: No results for input(s): VITAMINB12, FOLATE, FERRITIN, TIBC, IRON, RETICCTPCT in the last 72 hours. Sepsis Labs: No results for input(s): PROCALCITON, LATICACIDVEN in the last 168 hours.  No results found for this or any previous visit (from the past 240 hour(s)).    Radiology Studies: Ct Chest W Contrast  Result Date: 04/10/2018 CLINICAL DATA:  Stage IV rectal adenocarcinoma, presenting for restaging with ongoing chemotherapy. EXAM: CT CHEST, ABDOMEN, AND PELVIS WITH CONTRAST TECHNIQUE: Multidetector CT imaging of the chest, abdomen and pelvis was performed following the standard protocol during bolus administration of intravenous contrast. CONTRAST:  166mL OMNIPAQUE IOHEXOL 300 MG/ML  SOLN COMPARISON:  01/08/2018 CT chest, abdomen and pelvis. FINDINGS: CT CHEST FINDINGS Cardiovascular: Normal heart size. No significant pericardial effusion/thickening. Right internal jugular MediPort terminates at the cavoatrial junction. Great vessels are normal in course and caliber. There is an acute pulmonary embolus to the right lower lobe involving lobar, segmental and subsegmental branches. Elevated RV/LV ratio 1.08. Mediastinum/Nodes: No discrete thyroid nodules. Unremarkable esophagus. Mildly enlarged 1.3 cm left supraclavicular node (series 2/image 6) is decreased from 2.5 cm. No axillary adenopathy. No mediastinal or hilar  adenopathy. Lungs/Pleura: No pneumothorax. No pleural effusion. Numerous solid pulmonary masses and nodules throughout both lungs (greater than 30), significantly increased in size and number. For example, a 3.3 cm right middle lobe mass (series 9/image 129), increased from 1.8 cm. Basilar left lower lobe 2.3 cm nodule (series 9/image 136) is increased from 0.8 cm. Left upper lobe 1.9 cm nodule (series 9/image 49) is increased from 0.8 cm. Musculoskeletal:  No discrete focal osseous lesions in the chest. CT ABDOMEN PELVIS FINDINGS Hepatobiliary: Liver is enlarged by numerous (greater than 10) bulky heterogeneous hypoenhancing masses, significantly increased in size and number. For example, an 8.0 x 7.5 cm lateral segment left liver lobe mass (series 2/image 71), increased from 2.8 x 1.8 cm. Inferior right liver lobe 7.8 x 4.7 cm mass (series 2/image 74), increased from 2.1 x 2.1 cm. Right liver dome 9.7 x 8.3 cm mass (series 2/image 56), increased from 1.1 x 1.0 cm. Normal gallbladder with no radiopaque cholelithiasis. No biliary ductal dilatation. Pancreas: Normal, with no mass  or duct dilation. Spleen: Normal size. No mass. Adrenals/Urinary Tract: No discrete adrenal nodules. Mild-to-moderate right hydroureteronephrosis to the level of mid right lumbar ureter is mildly increased. No left hydronephrosis. Contrast nephrograms are symmetric and within normal limits. Subcentimeter hypodense renal cortical lesions in the upper right kidney are too small to characterize and are stable. No new renal lesions. Bladder is collapsed with stable chronic mild diffuse bladder wall thickening and perivesical fat haziness. Stomach/Bowel: Normal non-distended stomach. Moderate right inguinal hernia containing multiple right pelvic small bowel loops, unchanged. No small bowel dilatation or wall thickening. Oral contrast transits to the colon. Appendix appears normal. Stable postsurgical changes from end sigmoid colostomy in the  ventral left abdominal wall with no wall thickening or acute pericolonic fat stranding in the remnant large-bowel. Stable postsurgical and post treatment changes from abdominoperineal resection, with mildly heterogeneous soft tissue density in the presacral space measuring up to 3.4 cm thickness (series 2/image 113), stable. Vascular/Lymphatic: Atherosclerotic nonaneurysmal abdominal aorta. Patent portal, hepatic, splenic and renal veins. Aortocaval adenopathy measures up to 3.5 cm (series 2/image 77), decreased from 4.3 cm. No new adenopathy in the abdomen or pelvis. Reproductive: Stable top-normal size prostate. Other: No pneumoperitoneum. No ascites. No focal fluid collections. Heterogeneous hypodense right psoas muscle 11.2 x 10.6 cm mass (series 2/image 80), previously 11.8 x 11.1 cm, not appreciably changed. Musculoskeletal: No discrete osseous lesions. Mild lumbar spondylosis. IMPRESSION: 1. Acute pulmonary embolism in the right lower lobe, extending proximally to the lobar branch. 2. Positive for acute PE with CT evidence of right heart strain (RV/LV Ratio = 1.1) consistent with at least submassive (intermediate risk) PE. The presence of right heart strain has been associated with an increased risk of morbidity and mortality. 3. Overall substantial progression of metastatic disease, although there are mixed changes as detailed below. 4. Pulmonary metastases have significantly increased in size and number. 5. Liver metastases have significantly increased in size and number. 6. Left supraclavicular and retroperitoneal adenopathy is decreased. 7. Large right psoas muscle metastasis is stable. 8. Mild to moderate right hydroureteronephrosis is mildly increased, although there is no evidence of a delayed right contrast nephrogram, suggesting that the degree of ureteral obstruction is low-grade at this time. 9. Stable right inguinal hernia containing small bowel loops without bowel complication. 10. Stable  postsurgical/post-treatment changes in the presacral space from APR. 11.  Aortic Atherosclerosis (ICD10-I70.0). Critical Value/emergent results were called by telephone at the time of interpretation on 04/10/2018 at 2:43 pm to Dr. Truitt Merle , who verbally acknowledged these results. Electronically Signed   By: Ilona Sorrel M.D.   On: 04/10/2018 14:47   Ct Abdomen Pelvis W Contrast  Result Date: 04/10/2018 CLINICAL DATA:  Stage IV rectal adenocarcinoma, presenting for restaging with ongoing chemotherapy. EXAM: CT CHEST, ABDOMEN, AND PELVIS WITH CONTRAST TECHNIQUE: Multidetector CT imaging of the chest, abdomen and pelvis was performed following the standard protocol during bolus administration of intravenous contrast. CONTRAST:  119mL OMNIPAQUE IOHEXOL 300 MG/ML  SOLN COMPARISON:  01/08/2018 CT chest, abdomen and pelvis. FINDINGS: CT CHEST FINDINGS Cardiovascular: Normal heart size. No significant pericardial effusion/thickening. Right internal jugular MediPort terminates at the cavoatrial junction. Great vessels are normal in course and caliber. There is an acute pulmonary embolus to the right lower lobe involving lobar, segmental and subsegmental branches. Elevated RV/LV ratio 1.08. Mediastinum/Nodes: No discrete thyroid nodules. Unremarkable esophagus. Mildly enlarged 1.3 cm left supraclavicular node (series 2/image 6) is decreased from 2.5 cm. No axillary adenopathy. No mediastinal or hilar  adenopathy. Lungs/Pleura: No pneumothorax. No pleural effusion. Numerous solid pulmonary masses and nodules throughout both lungs (greater than 30), significantly increased in size and number. For example, a 3.3 cm right middle lobe mass (series 9/image 129), increased from 1.8 cm. Basilar left lower lobe 2.3 cm nodule (series 9/image 136) is increased from 0.8 cm. Left upper lobe 1.9 cm nodule (series 9/image 49) is increased from 0.8 cm. Musculoskeletal:  No discrete focal osseous lesions in the chest. CT ABDOMEN PELVIS  FINDINGS Hepatobiliary: Liver is enlarged by numerous (greater than 10) bulky heterogeneous hypoenhancing masses, significantly increased in size and number. For example, an 8.0 x 7.5 cm lateral segment left liver lobe mass (series 2/image 71), increased from 2.8 x 1.8 cm. Inferior right liver lobe 7.8 x 4.7 cm mass (series 2/image 74), increased from 2.1 x 2.1 cm. Right liver dome 9.7 x 8.3 cm mass (series 2/image 56), increased from 1.1 x 1.0 cm. Normal gallbladder with no radiopaque cholelithiasis. No biliary ductal dilatation. Pancreas: Normal, with no mass or duct dilation. Spleen: Normal size. No mass. Adrenals/Urinary Tract: No discrete adrenal nodules. Mild-to-moderate right hydroureteronephrosis to the level of mid right lumbar ureter is mildly increased. No left hydronephrosis. Contrast nephrograms are symmetric and within normal limits. Subcentimeter hypodense renal cortical lesions in the upper right kidney are too small to characterize and are stable. No new renal lesions. Bladder is collapsed with stable chronic mild diffuse bladder wall thickening and perivesical fat haziness. Stomach/Bowel: Normal non-distended stomach. Moderate right inguinal hernia containing multiple right pelvic small bowel loops, unchanged. No small bowel dilatation or wall thickening. Oral contrast transits to the colon. Appendix appears normal. Stable postsurgical changes from end sigmoid colostomy in the ventral left abdominal wall with no wall thickening or acute pericolonic fat stranding in the remnant large-bowel. Stable postsurgical and post treatment changes from abdominoperineal resection, with mildly heterogeneous soft tissue density in the presacral space measuring up to 3.4 cm thickness (series 2/image 113), stable. Vascular/Lymphatic: Atherosclerotic nonaneurysmal abdominal aorta. Patent portal, hepatic, splenic and renal veins. Aortocaval adenopathy measures up to 3.5 cm (series 2/image 77), decreased from 4.3 cm.  No new adenopathy in the abdomen or pelvis. Reproductive: Stable top-normal size prostate. Other: No pneumoperitoneum. No ascites. No focal fluid collections. Heterogeneous hypodense right psoas muscle 11.2 x 10.6 cm mass (series 2/image 80), previously 11.8 x 11.1 cm, not appreciably changed. Musculoskeletal: No discrete osseous lesions. Mild lumbar spondylosis. IMPRESSION: 1. Acute pulmonary embolism in the right lower lobe, extending proximally to the lobar branch. 2. Positive for acute PE with CT evidence of right heart strain (RV/LV Ratio = 1.1) consistent with at least submassive (intermediate risk) PE. The presence of right heart strain has been associated with an increased risk of morbidity and mortality. 3. Overall substantial progression of metastatic disease, although there are mixed changes as detailed below. 4. Pulmonary metastases have significantly increased in size and number. 5. Liver metastases have significantly increased in size and number. 6. Left supraclavicular and retroperitoneal adenopathy is decreased. 7. Large right psoas muscle metastasis is stable. 8. Mild to moderate right hydroureteronephrosis is mildly increased, although there is no evidence of a delayed right contrast nephrogram, suggesting that the degree of ureteral obstruction is low-grade at this time. 9. Stable right inguinal hernia containing small bowel loops without bowel complication. 10. Stable postsurgical/post-treatment changes in the presacral space from APR. 11.  Aortic Atherosclerosis (ICD10-I70.0). Critical Value/emergent results were called by telephone at the time of interpretation on 04/10/2018 at 2:43  pm to Dr. Truitt Merle , who verbally acknowledged these results. Electronically Signed   By: Ilona Sorrel M.D.   On: 04/10/2018 14:47      Scheduled Meds: . feeding supplement (ENSURE ENLIVE)  237 mL Oral BID BM  . magnesium oxide  400 mg Oral BID  . nystatin  5 mL Oral QID  . pantoprazole  40 mg Oral Daily    Continuous Infusions: . heparin 1,000 Units/hr (04/11/18 1146)     LOS: 1 day    Time spent: Total of 35 minutes spent with pt, greater than 50% of which was spent in discussion of  treatment, counseling and coordination of care    Chipper Oman, MD Pager: Text Page via [email or web address]   If 7PM-7AM, please contact night-coverage [email or web address] 04/11/2018, 1:30 PM   Note - This record has been created using [employer]. Chart creation errors have been sought, but may not always have been located. Such creation errors do not reflect on the standard of medical care.

## 2018-04-11 NOTE — Progress Notes (Signed)
ANTICOAGULATION CONSULT NOTE - Follow Up Consult  Pharmacy Consult for Heparin Indication: pulmonary embolus  Allergies  Allergen Reactions  . Sulfa Antibiotics Nausea Only and Other (See Comments)    Reaction may years ago, pt thinks nausea only.     Patient Measurements: Height: 6' (182.9 cm) Weight: 123 lb 12.8 oz (56.2 kg) IBW/kg (Calculated) : 77.6 Heparin Dosing Weight:   Vital Signs: Temp: 99 F (37.2 C) (08/15 2128) Temp Source: Oral (08/15 2128) BP: 122/89 (08/15 2128) Pulse Rate: 72 (08/15 2128)  Labs: Recent Labs    04/10/18 1705 04/10/18 1728 04/10/18 1937 04/11/18 0034 04/11/18 0732 04/11/18 1200 04/11/18 1605 04/11/18 1956 04/11/18 2125  HGB 7.7* 7.5*  --  6.9*  --   --  7.6*  --   --   HCT 23.6* 22.0*  --  21.2*  --   --  22.8*  --   --   PLT 72*  --   --  65*  --   --  58*  --   --   APTT  --   --   --  39*  --   --   --   --   --   LABPROT  --   --   --  16.9*  --   --   --   --   --   INR  --   --   --  1.39  --   --   --   --   --   HEPARINUNFRC  --   --   --   --   --  0.96*  --  <0.10* <0.10*  CREATININE 0.89 0.80  --  0.83  --   --   --   --   --   TROPONINI  --   --  0.03* 0.03* 0.03*  --   --   --   --     Estimated Creatinine Clearance: 73.4 mL/min (by C-G formula based on SCr of 0.83 mg/dL).   Medications:  Infusions:  . heparin 900 Units/hr (04/11/18 9794)    Assessment: Patient with 2nd heparin level low, redrawn to confirm.  Unclear what levels and rates don't seem to correlate.  Based on two separate levels drawn that are < 0.1, will increase dose and check levels.  Goal of Therapy:  Heparin level 0.3-0.7 units/ml Monitor platelets by anticoagulation protocol: Yes   Plan:  Increase heparin to 1000 units/hr Recheck heparin level at 0800  Nani Skillern Crowford 04/11/2018,11:07 PM

## 2018-04-11 NOTE — Care Management Note (Signed)
Case Management Note  Patient Details  Name: David Clarke MRN: [id number] Date of Birth: December 07, 1955  Subjective/Objective: Admitted w/Met Colon Ca. From home. Has pcp-Dr. Lauenstein(will make own pcp appt),has transp,pharmacy. No CM needs.                  Action/Plan:d/c plan home.   Expected Discharge Date:  (unknown)               Expected Discharge Plan:  Home/Self Care  In-House Referral:     Discharge planning Services  CM Consult  Post Acute Care Choice:    Choice offered to:     DME Arranged:    DME Agency:     HH Arranged:    HH Agency:     Status of Service:  In process, will continue to follow  If discussed at Long Length of Stay Meetings, dates discussed:    Additional Comments:  Dessa Phi, RN 04/11/2018, 1:24 PM

## 2018-04-12 DIAGNOSIS — I2609 Other pulmonary embolism with acute cor pulmonale: Secondary | ICD-10-CM

## 2018-04-12 LAB — CBC
HCT: 24.4 % — ABNORMAL LOW (ref 39.0–52.0)
HEMOGLOBIN: 8.2 g/dL — AB (ref 13.0–17.0)
MCH: 31.8 pg (ref 26.0–34.0)
MCHC: 33.6 g/dL (ref 30.0–36.0)
MCV: 94.6 fL (ref 78.0–100.0)
PLATELETS: 61 10*3/uL — AB (ref 150–400)
RBC: 2.58 MIL/uL — AB (ref 4.22–5.81)
RDW: 17.8 % — ABNORMAL HIGH (ref 11.5–15.5)
WBC: 4.6 10*3/uL (ref 4.0–10.5)

## 2018-04-12 LAB — HEPARIN LEVEL (UNFRACTIONATED)
Heparin Unfractionated: 0.11 IU/mL — ABNORMAL LOW (ref 0.30–0.70)
Heparin Unfractionated: <0.1 IU/mL — ABNORMAL LOW (ref 0.30–0.70)

## 2018-04-12 LAB — HIV ANTIBODY (ROUTINE TESTING W REFLEX): HIV Screen 4th Generation wRfx: NONREACTIVE

## 2018-04-12 LAB — TYPE AND SCREEN
ABO/RH(D): O POS
ANTIBODY SCREEN: NEGATIVE
UNIT DIVISION: 0

## 2018-04-12 LAB — BPAM RBC
BLOOD PRODUCT EXPIRATION DATE: 201909142359
ISSUE DATE / TIME: 201908150246
UNIT TYPE AND RH: 5100

## 2018-04-12 MED ORDER — HEPARIN (PORCINE) IN NACL 100-0.45 UNIT/ML-% IJ SOLN
1750.0000 [IU]/h | INTRAMUSCULAR | Status: DC
  Administered 2018-04-12: 1600 [IU]/h via INTRAVENOUS
  Filled 2018-04-12: qty 250

## 2018-04-12 NOTE — Progress Notes (Addendum)
[place] for heparin IV Indication: pulmonary embolus  Allergies  Allergen Reactions  . Sulfa Antibiotics Nausea Only and Other (See Comments)    Reaction may years ago, pt thinks nausea only.     Patient Measurements: Height: 6' (182.9 cm) Weight: 123 lb 12.8 oz (56.2 kg) IBW/kg (Calculated) : 77.6 Heparin Dosing Weight: TBW  Vital Signs: Temp: 99.1 F (37.3 C) (08/16 1343) Temp Source: Oral (08/16 1343) BP: 136/92 (08/16 1343) Pulse Rate: 74 (08/16 1343)  Labs: Recent Labs    04/10/18 1705 04/10/18 1728 04/10/18 1937 04/11/18 0034 04/11/18 0732  04/11/18 1605  04/11/18 2125 04/12/18 0605 04/12/18 0922 04/12/18 1838  HGB 7.7* 7.5*  --  6.9*  --   --  7.6*  --   --  8.2*  --   --   HCT 23.6* 22.0*  --  21.2*  --   --  22.8*  --   --  24.4*  --   --   PLT 72*  --   --  65*  --   --  58*  --   --  61*  --   --   APTT  --   --   --  39*  --   --   --   --   --   --   --   --   LABPROT  --   --   --  16.9*  --   --   --   --   --   --   --   --   INR  --   --   --  1.39  --   --   --   --   --   --   --   --   HEPARINUNFRC  --   --   --   --   --    < >  --    < > <0.10*  --  0.11* <0.10*  CREATININE 0.89 0.80  --  0.83  --   --   --   --   --   --   --   --   TROPONINI  --   --  0.03* 0.03* 0.03*  --   --   --   --   --   --   --    < > = values in this interval not displayed.    Estimated Creatinine Clearance: 73.4 mL/min (by C-G formula based on SCr of 0.83 mg/dL).   Medical History: Past Medical History:  Diagnosis Date  . Allergy   . Cancer ([place]) 10/09/2016   rectal adenocarcinoma  . Inguinal hernia     Medications:  Facility-Administered Medications Prior to Admission  Medication Dose Route Frequency Provider Last Rate Last Dose  . 0.9 %  sodium chloride infusion  500 mL Intravenous Continuous Pyrtle, Lajuan Lines, MD      . 0.9 %  sodium chloride infusion  500 mL Intravenous Once Pyrtle, Lajuan Lines, MD       Medications  Prior to Admission  Medication Sig Dispense Refill Last Dose  . Cannabinoids (THC FREE PO) Inhale into the lungs. THC Oil via vape as needed for sleep   04/09/2018 at Unknown time  . clindamycin (CLINDAGEL) 1 % gel Apply topically 2 (two) times daily. 60 g 1 04/10/2018 at Unknown time  . ibuprofen (ADVIL,MOTRIN) 200 MG tablet Take 200-600 mg by mouth daily as needed for  moderate pain.   Past Week at Unknown time  . magnesium oxide (MAG-OX) 400 (241.3 Mg) MG tablet Take 1 tablet (400 mg total) by mouth 2 (two) times daily. 60 tablet 1 Past Week at Unknown time  . Multiple Vitamin (MULTIVITAMIN WITH MINERALS) TABS tablet Take 1 tablet by mouth every other day.    Past Week at Unknown time  . nystatin (MYCOSTATIN) 100000 UNIT/ML suspension Take 5 mLs (500,000 Units total) by mouth 4 (four) times daily. 473 mL 0 04/09/2018  . omeprazole (PRILOSEC) 40 MG capsule Take 1 capsule (40 mg total) by mouth daily. 30 capsule 2 04/09/2018 at Unknown time  . potassium chloride SA (K-DUR,KLOR-CON) 20 MEQ tablet Take 1 tablet (20 mEq total) by mouth 2 (two) times daily. 60 tablet 1 Past Week at Unknown time  . dexamethasone (DECADRON) 4 MG tablet Take 1 tablet (4 mg total) by mouth daily. Start the day after chemo for 3-5 days. (Patient not taking: Reported on 04/10/2018) 10 tablet 2 Not Taking at Unknown time  . ondansetron (ZOFRAN) 8 MG tablet Take 1 tablet (8 mg total) by mouth 2 (two) times daily as needed for refractory nausea / vomiting. Start on day 3 after chemotherapy. (Patient not taking: Reported on 04/04/2018) 30 tablet 1 Not Taking at Unknown time  . prochlorperazine (COMPAZINE) 10 MG tablet Take 1 tablet (10 mg total) by mouth every 6 (six) hours as needed (NAUSEA). (Patient not taking: Reported on 04/04/2018) 30 tablet 1 Not Taking at Unknown time   Scheduled:  . feeding supplement (ENSURE ENLIVE)  237 mL Oral BID BM  . magnesium oxide  400 mg Oral BID  . nystatin  5 mL Oral QID  . pantoprazole  40 mg Oral  Daily   PRN: acetaminophen **OR** acetaminophen, ondansetron **OR** ondansetron (ZOFRAN) IV, sodium chloride flush  Assessment: 6 yoM with PMH metastatic colon cancer, rectal adenocarcinoma with last chemo given 04/04/18, and neuropathy. Found to have new PE. Given Lovenox 1 mg/kg x 1 in ED, but oncologist would prefer patient on heparin given low Hgb/Plt from recent chemo.   Baseline INR, aPTT: WNL  Prior anticoagulation: none PTA; Lovenox 55 mg given at 1730 on 8/14  Significant events:  Today, 04/12/2018:  Heparin level still subtherapeutic after increase in heparin infusion rate earlier today  No boluses have been given due to low plts - 61k  Per RN, no reported bleeding or problems with IV site  Goal of Therapy: Heparin level 0.3-0.7 units/ml Monitor platelets by anticoagulation protocol: Yes  Plan:  Increase IV heparin rate from 1200 units/hr to 1600 units/hr  Check heparin level 8 hrs after rate increase since not giving bolus  Daily CBC, daily heparin level once stable  Monitor for signs of bleeding or thrombosis   Adrian Saran, PharmD, BCPS Pager [phone] 04/12/2018 8:45 PM

## 2018-04-12 NOTE — Progress Notes (Signed)
PROGRESS NOTE [facility]   David Clarke   NKN:[id number] DOB: 20-Mar-1956  DOA: 04/10/2018 PCP: Robyn Haber, MD   Brief Narrative:  David Clarke patient is a 62 year old male with medical history significant for metastatic rectal adenocarcinoma and neuropathy who presented to the emergency department after being sent by the oncology office with right leg pain and a CT that showed PE and progression of metastatic disease.  Patient was admitted with working diagnosis of PE and started on heparin drip.  04/12/2018: We will continue heparin drip for now.  Discuss anticoagulation to transitions on discharge the patient's heme-onc prior to discharge.  Patient still gets short winded on minimal exertion.  Monitor CBC, especially, platelet count.  Subjective: Patient seen alongside patient's daughter, partner, 2 friends and patient's nurse.  Patient gets shortness of breath with exertion. No fever or chills. No chest pain.   Assessment & Plan: Acute PE/DVT  [place] duet malignancy and immobility. CT chest shows acute RLL embolism with right heart strain. Doppler LE shows R DVT at the saphenous femoral junction. Agree with heparin ggt for now, with treat for total of 48 hrs, then switch to NOAC vs Lovenox. Patient is thrombocytopenic will defer to oncology choice of a/c. Check ECHO. Bedrest today, increase activities slowly in AM.  04/12/2018: Echocardiogram revealed grade 1 diastolic dysfunction.  No right ventricular strain reported. We will continue heparin drip for now. Patient hematology/oncology team to advise [place] regarding anticoagulation to transition to. Guarded prognosis.  Anemia of chronic inflammation:  Patient was transfused 1 unit of PRBCs.  Check hemoglobin level.  No signs of overt bleeding. Continue to monitor hemoglobin.  Thrombocytopenia Likely related to chemotherapy, no signs of bleeding at this time.  Continue to monitor closely as patient is on heparin drip  which can affect platelet levels. 04/12/2018: Platelet count was 61 today.  Continue to monitor closely.  Rectal adenocarcinoma New CT scans shows progressive disease.  Oncology aware.  Management per Dr. Burr Medico   DVT prophylaxis: Heparin ggt Code Status: Full code Family Communication: None at bedside Disposition Plan: Home in 24 to 48 hours after work-up has been completed.  Consultants:   Oncology   Procedures:   None   Antimicrobials:  None    Objective: Vitals:   04/11/18 2128 04/12/18 0512 04/12/18 1343 04/12/18 2112  BP: 122/89 128/86 (!) 136/92 (!) 144/93  Pulse: 72 83 74 89  Resp: 18 18  18   Temp: 99 F (37.2 C) 99.1 F (37.3 C) 99.1 F (37.3 C) 99.9 F (37.7 C)  TempSrc: Oral Oral Oral Oral  SpO2: 100% 99% 100% 99%  Weight:      Height:        Intake/Output Summary (Last 24 hours) at 04/12/2018 2353 Last data filed at 04/12/2018 2200 Gross per 24 hour  Intake 1523.27 ml  Output 1100 ml  Net 423.27 ml   Filed Weights   04/10/18 1610  Weight: 56.2 kg    Examination:  General exam: Appears calm and comfortable.  Cachectic.   HEENT: Pallor.  No jaundice.  Respiratory system: Clear to auscultation.  Port in R chest  Cardiovascular system: S1 & S2 heard, RRR. No JVD, murmurs, rubs or gallops Gastrointestinal system: Abdomen is nondistended, soft and nontender. No organomegaly or masses Central nervous system: Alert and oriented. No focal neurological deficits. Extremities: No pedal edema.  Mild calf tenderness Psychiatry: Mood & affect appropriate.    Data Reviewed: I have personally reviewed following labs and imaging  studies  CBC: Recent Labs  Lab 04/10/18 1705 04/10/18 1728 04/11/18 0034 04/11/18 1605 04/12/18 0605  WBC 13.1*  --  9.8 6.6 4.6  NEUTROABS 12.4*  --   --   --   --   HGB 7.7* 7.5* 6.9* 7.6* 8.2*  HCT 23.6* 22.0* 21.2* 22.8* 24.4*  MCV 98.3  --  97.7 95.0 94.6  PLT 72*  --  65* 58* 61*   Basic Metabolic Panel: Recent  Labs  Lab 04/10/18 1705 04/10/18 1728 04/11/18 0034  NA 134* 131* 134*  K 4.3 4.2 4.0  CL 101 99 103  CO2 21*  --  21*  GLUCOSE 113* 111* 110*  BUN 22 20 20   CREATININE 0.89 0.80 0.83  CALCIUM 8.4*  --  8.0*  MG  --   --  1.8  PHOS  --   --  1.1*   GFR: Estimated Creatinine Clearance: 73.4 mL/min (by C-G formula based on SCr of 0.83 mg/dL). Liver Function Tests: Recent Labs  Lab 04/10/18 1705 04/11/18 0034  AST 33 30  ALT 35 29  ALKPHOS 685* 599*  BILITOT 0.8 0.7  PROT 6.1* 5.5*  ALBUMIN 2.9* 2.6*   No results for input(s): LIPASE, AMYLASE in the last 168 hours. No results for input(s): AMMONIA in the last 168 hours. Coagulation Profile: Recent Labs  Lab 04/11/18 0034  INR 1.39   Cardiac Enzymes: Recent Labs  Lab 04/10/18 1937 04/11/18 0034 04/11/18 0732  TROPONINI 0.03* 0.03* 0.03*   BNP (last 3 results) No results for input(s): PROBNP in the last 8760 hours. HbA1C: No results for input(s): HGBA1C in the last 72 hours. CBG: No results for input(s): GLUCAP in the last 168 hours. Lipid Profile: No results for input(s): CHOL, HDL, LDLCALC, TRIG, CHOLHDL, LDLDIRECT in the last 72 hours. Thyroid Function Tests: Recent Labs    04/11/18 0034  TSH 2.877   Anemia Panel: No results for input(s): VITAMINB12, FOLATE, FERRITIN, TIBC, IRON, RETICCTPCT in the last 72 hours. Sepsis Labs: No results for input(s): PROCALCITON, LATICACIDVEN in the last 168 hours.  No results found for this or any previous visit (from the past 240 hour(s)).    Radiology Studies: No results found.    Scheduled Meds: . feeding supplement (ENSURE ENLIVE)  237 mL Oral BID BM  . magnesium oxide  400 mg Oral BID  . nystatin  5 mL Oral QID  . pantoprazole  40 mg Oral Daily   Continuous Infusions: . heparin 1,600 Units/hr (04/12/18 2103)     LOS: 2 days    Time spent: 35 minutes   Bonnell Public, MD Pager: Text Page via [email or web address]   If 7PM-7AM, please contact  night-coverage [email or web address] 04/12/2018, 11:53 PM   Note - This record has been created using [employer]. Chart creation errors have been sought, but may not always have been located. Such creation errors do not reflect on the standard of medical care.

## 2018-04-12 NOTE — Progress Notes (Signed)
David Clarke   DOB:June 19, 1956   RJ#:[id number]   TKZ#:[id number]  Oncology follow up   Subjective: Patient is tolerating heparin drip well, no bleedings.  He overall feels slightly better, has not been ambulate much.  He denies any signs of bleeding.  Vital signs stable.   Objective:  Vitals:   04/12/18 0512 04/12/18 1343  BP: 128/86 (!) 136/92  Pulse: 83 74  Resp: 18   Temp: 99.1 F (37.3 C) 99.1 F (37.3 C)  SpO2: 99% 100%    Body mass index is 16.79 kg/m.  Intake/Output Summary (Last 24 hours) at 04/12/2018 1643 Last data filed at 04/12/2018 1529 Gross per 24 hour  Intake 2087.35 ml  Output 1600 ml  Net 487.35 ml     Sclerae unicteric  Oropharynx clear  No peripheral adenopathy  Lungs clear -- no rales or rhonchi  Heart regular rate and rhythm  Abdomen benign  MSK no focal spinal tenderness, no peripheral edema  Neuro nonfocal    CBG (last 3)  No results for input(s): GLUCAP in the last 72 hours.   Labs:  Lab Results  Component Value Date   WBC 4.6 04/12/2018   HGB 8.2 (L) 04/12/2018   HCT 24.4 (L) 04/12/2018   MCV 94.6 04/12/2018   PLT 61 (L) 04/12/2018   NEUTROABS 12.4 (H) 04/10/2018    CMP Latest Ref Rng & Units 04/11/2018 04/10/2018 04/10/2018  Glucose 70 - 99 mg/dL 110(H) 111(H) 113(H)  BUN 8 - 23 mg/dL 20 20 22   Creatinine 0.61 - 1.24 mg/dL 0.83 0.80 0.89  Sodium 135 - 145 mmol/L 134(L) 131(L) 134(L)  Potassium 3.5 - 5.1 mmol/L 4.0 4.2 4.3  Chloride 98 - 111 mmol/L 103 99 101  CO2 22 - 32 mmol/L 21(L) - 21(L)  Calcium 8.9 - 10.3 mg/dL 8.0(L) - 8.4(L)  Total Protein 6.5 - 8.1 g/dL 5.5(L) - 6.1(L)  Total Bilirubin 0.3 - 1.2 mg/dL 0.7 - 0.8  Alkaline Phos 38 - 126 U/L 599(H) - 685(H)  AST 15 - 41 U/L 30 - 33  ALT 0 - 44 U/L 29 - 35    Urine Studies No results for input(s): UHGB, CRYS in the last 72 hours.  Invalid input(s): UACOL, UAPR, USPG, UPH, UTP, UGL, UKET, UBIL, UNIT, UROB, ULEU, UEPI, UWBC, URBC, UBAC, CAST, UCOM, BILUA  Basic Metabolic  Panel: Recent Labs  Lab 04/10/18 1705 04/10/18 1728 04/11/18 0034  NA 134* 131* 134*  K 4.3 4.2 4.0  CL 101 99 103  CO2 21*  --  21*  GLUCOSE 113* 111* 110*  BUN 22 20 20   CREATININE 0.89 0.80 0.83  CALCIUM 8.4*  --  8.0*  MG  --   --  1.8  PHOS  --   --  1.1*   GFR Estimated Creatinine Clearance: 73.4 mL/min (by C-G formula based on SCr of 0.83 mg/dL). Liver Function Tests: Recent Labs  Lab 04/10/18 1705 04/11/18 0034  AST 33 30  ALT 35 29  ALKPHOS 685* 599*  BILITOT 0.8 0.7  PROT 6.1* 5.5*  ALBUMIN 2.9* 2.6*   No results for input(s): LIPASE, AMYLASE in the last 168 hours. No results for input(s): AMMONIA in the last 168 hours. Coagulation profile Recent Labs  Lab 04/11/18 0034  INR 1.39    CBC: Recent Labs  Lab 04/10/18 1705 04/10/18 1728 04/11/18 0034 04/11/18 1605 04/12/18 0605  WBC 13.1*  --  9.8 6.6 4.6  NEUTROABS 12.4*  --   --   --   --  HGB 7.7* 7.5* 6.9* 7.6* 8.2*  HCT 23.6* 22.0* 21.2* 22.8* 24.4*  MCV 98.3  --  97.7 95.0 94.6  PLT 72*  --  65* 58* 61*   Cardiac Enzymes: Recent Labs  Lab 04/10/18 1937 04/11/18 0034 04/11/18 0732  TROPONINI 0.03* 0.03* 0.03*   BNP: Invalid input(s): POCBNP CBG: No results for input(s): GLUCAP in the last 168 hours. D-Dimer No results for input(s): DDIMER in the last 72 hours. Hgb A1c No results for input(s): HGBA1C in the last 72 hours. Lipid Profile No results for input(s): CHOL, HDL, LDLCALC, TRIG, CHOLHDL, LDLDIRECT in the last 72 hours. Thyroid function studies Recent Labs    04/11/18 0034  TSH 2.877   Anemia work up No results for input(s): VITAMINB12, FOLATE, FERRITIN, TIBC, IRON, RETICCTPCT in the last 72 hours. Microbiology No results found for this or any previous visit (from the past 240 hour(s)).    Studies:  No results found.  Assessment: 62 y.o. with metastatic colon cancer, on chemo, was admitted for DVT/PE and pancrytopenia   1. Acute DVT/PE with right heart strain   2. 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 Moderate thrombocytopenia, no clinical bleeding  4.  Severe calorie and protein malnutrition 5.  Metastatic rectal adenocarcinoma to lung, liver and lymph nodes, recent disease progression    Plan:  -due to persistent low plt, will keep him on heparin drip for now. If his plt improve>70K tomorrow, OK to discharge on xarelto  -I encouraged patient to ambulate.  If he does have dyspnea on exertion, and repeated platelet count less than 8 tomorrow, please give 1 more unit of blood before discharge -He has appointment with [place] next Thursday. I can also set up lab appointment for early next week if needed.  -my partner Dr. Benay Spice will be on call this weekend, please call us if needed. You may call me also if needed.    Truitt Merle, MD 04/12/2018  4:43 PM  Cell [phone]

## 2018-04-12 NOTE — Progress Notes (Signed)
[place] for heparin IV Indication: pulmonary embolus  Allergies  Allergen Reactions  . Sulfa Antibiotics Nausea Only and Other (See Comments)    Reaction may years ago, pt thinks nausea only.     Patient Measurements: Height: 6' (182.9 cm) Weight: 123 lb 12.8 oz (56.2 kg) IBW/kg (Calculated) : 77.6 Heparin Dosing Weight: TBW  Vital Signs: Temp: 99.1 F (37.3 C) (08/16 0512) Temp Source: Oral (08/16 0512) BP: 128/86 (08/16 0512) Pulse Rate: 83 (08/16 0512)  Labs: Recent Labs    04/10/18 1705 04/10/18 1728 04/10/18 1937 04/11/18 0034 04/11/18 0732  04/11/18 1605 04/11/18 1956 04/11/18 2125 04/12/18 0605 04/12/18 0922  HGB 7.7* 7.5*  --  6.9*  --   --  7.6*  --   --  8.2*  --   HCT 23.6* 22.0*  --  21.2*  --   --  22.8*  --   --  24.4*  --   PLT 72*  --   --  65*  --   --  58*  --   --  61*  --   APTT  --   --   --  39*  --   --   --   --   --   --   --   LABPROT  --   --   --  16.9*  --   --   --   --   --   --   --   INR  --   --   --  1.39  --   --   --   --   --   --   --   HEPARINUNFRC  --   --   --   --   --    < >  --  <0.10* <0.10*  --  0.11*  CREATININE 0.89 0.80  --  0.83  --   --   --   --   --   --   --   TROPONINI  --   --  0.03* 0.03* 0.03*  --   --   --   --   --   --    < > = values in this interval not displayed.    Estimated Creatinine Clearance: 73.4 mL/min (by C-G formula based on SCr of 0.83 mg/dL).   Medical History: Past Medical History:  Diagnosis Date  . Allergy   . Cancer ([place]) 10/09/2016   rectal adenocarcinoma  . Inguinal hernia     Medications:  Facility-Administered Medications Prior to Admission  Medication Dose Route Frequency Provider Last Rate Last Dose  . 0.9 %  sodium chloride infusion  500 mL Intravenous Continuous Pyrtle, Lajuan Lines, MD      . 0.9 %  sodium chloride infusion  500 mL Intravenous Once Pyrtle, Lajuan Lines, MD       Medications Prior to Admission  Medication Sig Dispense  Refill Last Dose  . Cannabinoids (THC FREE PO) Inhale into the lungs. THC Oil via vape as needed for sleep   04/09/2018 at Unknown time  . clindamycin (CLINDAGEL) 1 % gel Apply topically 2 (two) times daily. 60 g 1 04/10/2018 at Unknown time  . ibuprofen (ADVIL,MOTRIN) 200 MG tablet Take 200-600 mg by mouth daily as needed for moderate pain.   Past Week at Unknown time  . magnesium oxide (MAG-OX) 400 (241.3 Mg) MG tablet Take 1 tablet (400 mg total) by mouth 2 (two)  times daily. 60 tablet 1 Past Week at Unknown time  . Multiple Vitamin (MULTIVITAMIN WITH MINERALS) TABS tablet Take 1 tablet by mouth every other day.    Past Week at Unknown time  . nystatin (MYCOSTATIN) 100000 UNIT/ML suspension Take 5 mLs (500,000 Units total) by mouth 4 (four) times daily. 473 mL 0 04/09/2018  . omeprazole (PRILOSEC) 40 MG capsule Take 1 capsule (40 mg total) by mouth daily. 30 capsule 2 04/09/2018 at Unknown time  . potassium chloride SA (K-DUR,KLOR-CON) 20 MEQ tablet Take 1 tablet (20 mEq total) by mouth 2 (two) times daily. 60 tablet 1 Past Week at Unknown time  . dexamethasone (DECADRON) 4 MG tablet Take 1 tablet (4 mg total) by mouth daily. Start the day after chemo for 3-5 days. (Patient not taking: Reported on 04/10/2018) 10 tablet 2 Not Taking at Unknown time  . ondansetron (ZOFRAN) 8 MG tablet Take 1 tablet (8 mg total) by mouth 2 (two) times daily as needed for refractory nausea / vomiting. Start on day 3 after chemotherapy. (Patient not taking: Reported on 04/04/2018) 30 tablet 1 Not Taking at Unknown time  . prochlorperazine (COMPAZINE) 10 MG tablet Take 1 tablet (10 mg total) by mouth every 6 (six) hours as needed (NAUSEA). (Patient not taking: Reported on 04/04/2018) 30 tablet 1 Not Taking at Unknown time   Scheduled:  . feeding supplement (ENSURE ENLIVE)  237 mL Oral BID BM  . magnesium oxide  400 mg Oral BID  . nystatin  5 mL Oral QID  . pantoprazole  40 mg Oral Daily   PRN: acetaminophen **OR**  acetaminophen, ondansetron **OR** ondansetron (ZOFRAN) IV, sodium chloride flush  Assessment: 61 yoM with PMH metastatic colon cancer, rectal adenocarcinoma with last chemo given 04/04/18, and neuropathy. Found to have new PE. Given Lovenox 1 mg/kg x 1 in ED, but oncologist would prefer patient on heparin given low Hgb/Plt from recent chemo.   Baseline INR, aPTT: WNL  Prior anticoagulation: none PTA; Lovenox 55 mg given at 1730 on 8/14  Significant events:  Today, 04/12/2018:  HL 0.11, sub-therapeutic on 1000 units/hr  Plts 61  No bleeding or infusion issues per nursing  CrCl: 73 ml/min  Goal of Therapy: Heparin level 0.3-0.7 units/ml Monitor platelets by anticoagulation protocol: Yes  Plan:  increase heparin drip to 1200 units/hr, no bolus at this time due to low plts  Discussed above with MD  Check heparin level 6 hrs   Daily CBC, daily heparin level once stable  Monitor for signs of bleeding or thrombosis   Dolly Rias RPh 04/12/2018, 11:35 AM Pager [phone]

## 2018-04-12 NOTE — Plan of Care (Signed)
Plan of care discussed.   

## 2018-04-13 LAB — CBC
HEMATOCRIT: 25.5 % — AB (ref 39.0–52.0)
HEMOGLOBIN: 8.5 g/dL — AB (ref 13.0–17.0)
MCH: 31.3 pg (ref 26.0–34.0)
MCHC: 33.3 g/dL (ref 30.0–36.0)
MCV: 93.8 fL (ref 78.0–100.0)
Platelets: 86 10*3/uL — ABNORMAL LOW (ref 150–400)
RBC: 2.72 MIL/uL — AB (ref 4.22–5.81)
RDW: 17.5 % — ABNORMAL HIGH (ref 11.5–15.5)
WBC: 5 10*3/uL (ref 4.0–10.5)

## 2018-04-13 LAB — HEPARIN LEVEL (UNFRACTIONATED): Heparin Unfractionated: 0.26 IU/mL — ABNORMAL LOW (ref 0.30–0.70)

## 2018-04-13 MED ORDER — RIVAROXABAN 20 MG PO TABS: 20.0000 mg | ORAL_TABLET | Freq: Every day | ORAL | Status: DC

## 2018-04-13 MED ORDER — HEPARIN SOD (PORK) LOCK FLUSH 100 UNIT/ML IV SOLN: 500.0000 [IU] | INTRAVENOUS | Status: DC | PRN

## 2018-04-13 MED ORDER — RIVAROXABAN 20 MG PO TABS: 20.0000 mg | ORAL_TABLET | Freq: Every day | ORAL | 5 refills | Status: DC

## 2018-04-13 MED ORDER — RIVAROXABAN (XARELTO) VTE STARTER PACK (15 & 20 MG): ORAL_TABLET | ORAL | 0 refills | Status: DC

## 2018-04-13 MED ORDER — RIVAROXABAN 15 MG PO TABS
15.0000 mg | ORAL_TABLET | Freq: Two times a day (BID) | ORAL | Status: DC
  Administered 2018-04-13: 15 mg via ORAL
  Filled 2018-04-13: qty 1

## 2018-04-13 NOTE — Discharge Instructions (Signed)
Information on my medicine - XARELTO (rivaroxaban)  This medication education was reviewed with me or my healthcare representative as part of my discharge preparation.  The pharmacist that spoke with me during my hospital stay was:  Minda Ditto, [place]? Xarelto was prescribed to treat blood clots that may have been found in the veins of your legs (deep vein thrombosis) or in your lungs (pulmonary embolism) and to reduce the risk of them occurring again.  What do you need to know about Xarelto? The starting dose is one 15 mg tablet taken TWICE daily with food for the FIRST 21 DAYS then on (enter date)  09/07  the dose is changed to one 20 mg tablet taken ONCE A DAY with your evening meal.  DO NOT stop taking Xarelto without talking to the health care provider who prescribed the medication.  Refill your prescription for 20 mg tablets before you run out.  After discharge, you should have regular check-up appointments with your healthcare provider that is prescribing your Xarelto.  In the future your dose may need to be changed if your kidney function changes by a significant amount.  What do you do if you miss a dose? If you are taking Xarelto TWICE DAILY and you miss a dose, take it as soon as you remember. You may take two 15 mg tablets (total 30 mg) at the same time then resume your regularly scheduled 15 mg twice daily the next day.  If you are taking Xarelto ONCE DAILY and you miss a dose, take it as soon as you remember on the same day then continue your regularly scheduled once daily regimen the next day. Do not take two doses of Xarelto at the same time.   Important Safety Information Xarelto is a blood thinner medicine that can cause bleeding. You should call your healthcare provider right away if you experience any of the following: ? Bleeding from an injury or your nose that does not stop. ? Unusual colored urine (red or dark brown) or  unusual colored stools (red or black). ? Unusual bruising for unknown reasons. ? A serious fall or if you hit your head (even if there is no bleeding).  Some medicines may interact with Xarelto and might increase your risk of bleeding while on Xarelto. To help avoid this, consult your healthcare provider or pharmacist prior to using any new prescription or non-prescription medications, including herbals, vitamins, non-steroidal anti-inflammatory drugs (NSAIDs) and supplements.  This website has more information on Xarelto: [email or web address]

## 2018-04-13 NOTE — Progress Notes (Signed)
[place] for Xarelto Indication: pulmonary embolus  Allergies  Allergen Reactions  . Sulfa Antibiotics Nausea Only and Other (See Comments)    Reaction may years ago, pt thinks nausea only.    Patient Measurements: Height: 6' (182.9 cm) Weight: 123 lb 12.8 oz (56.2 kg) IBW/kg (Calculated) : 77.6 Vital Signs: Temp: 99.6 F (37.6 C) (08/17 0616) Temp Source: Oral (08/17 0616) BP: 117/85 (08/17 0616) Pulse Rate: 79 (08/17 0616)  Labs: Recent Labs    04/10/18 1705 04/10/18 1728 04/10/18 1937 04/11/18 0034 04/11/18 0732  04/11/18 1605  04/12/18 0605 04/12/18 0922 04/12/18 1838 04/13/18 0523  HGB 7.7* 7.5*  --  6.9*  --   --  7.6*  --  8.2*  --   --  8.5*  HCT 23.6* 22.0*  --  21.2*  --   --  22.8*  --  24.4*  --   --  25.5*  PLT 72*  --   --  65*  --   --  58*  --  61*  --   --  86*  APTT  --   --   --  39*  --   --   --   --   --   --   --   --   LABPROT  --   --   --  16.9*  --   --   --   --   --   --   --   --   INR  --   --   --  1.39  --   --   --   --   --   --   --   --   HEPARINUNFRC  --   --   --   --   --    < >  --    < >  --  0.11* <0.10* 0.26*  CREATININE 0.89 0.80  --  0.83  --   --   --   --   --   --   --   --   TROPONINI  --   --  0.03* 0.03* 0.03*  --   --   --   --   --   --   --    < > = values in this interval not displayed.   Estimated Creatinine Clearance: 73.4 mL/min (by C-G formula based on SCr of 0.83 mg/dL).   Assessment: 37 yoM with PMH metastatic colon cancer, rectal adenocarcinoma with last chemo given 04/04/18, and neuropathy. Found to have new PE. Given Lovenox 1 mg/kg x 1 in ED, but oncologist would prefer patient on heparin given low Hgb/Plt from recent chemo.   Baseline INR, aPTT: WNL  Prior anticoagulation: none PTA; Lovenox 55 mg given at 1730 on 8/14  Today, 04/13/2018 Discontinue Heparin, transition to Xarelto for PE Stop Heparin infusion at time of first Xarelto dose this am  Goal of  Therapy: Monitor platelets by anticoagulation protocol: Yes   Plan:   Xarelto 15mg  bid x 21 days, then dose change to 20mg  daily with dinner on 9/7  Xarelto patient education  Minda Ditto PharmD Pager [phone] 04/13/2018, 9:25 AM

## 2018-04-13 NOTE — Progress Notes (Signed)
ANTICOAGULATION CONSULT NOTE - Follow Up Consult  Pharmacy Consult for Heparin Indication: pulmonary embolus  Allergies  Allergen Reactions  . Sulfa Antibiotics Nausea Only and Other (See Comments)    Reaction may years ago, pt thinks nausea only.     Patient Measurements: Height: 6' (182.9 cm) Weight: 123 lb 12.8 oz (56.2 kg) IBW/kg (Calculated) : 77.6 Heparin Dosing Weight:   Vital Signs: Temp: 99.6 F (37.6 C) (08/17 0616) Temp Source: Oral (08/17 0616) BP: 117/85 (08/17 0616) Pulse Rate: 79 (08/17 0616)  Labs: Recent Labs    04/10/18 1705 04/10/18 1728 04/10/18 1937 04/11/18 0034 04/11/18 0732  04/11/18 1605  04/12/18 0605 04/12/18 0922 04/12/18 1838 04/13/18 0523  HGB 7.7* 7.5*  --  6.9*  --   --  7.6*  --  8.2*  --   --  8.5*  HCT 23.6* 22.0*  --  21.2*  --   --  22.8*  --  24.4*  --   --  25.5*  PLT 72*  --   --  65*  --   --  58*  --  61*  --   --  86*  APTT  --   --   --  39*  --   --   --   --   --   --   --   --   LABPROT  --   --   --  16.9*  --   --   --   --   --   --   --   --   INR  --   --   --  1.39  --   --   --   --   --   --   --   --   HEPARINUNFRC  --   --   --   --   --    < >  --    < >  --  0.11* <0.10* 0.26*  CREATININE 0.89 0.80  --  0.83  --   --   --   --   --   --   --   --   TROPONINI  --   --  0.03* 0.03* 0.03*  --   --   --   --   --   --   --    < > = values in this interval not displayed.    Estimated Creatinine Clearance: 73.4 mL/min (by C-G formula based on SCr of 0.83 mg/dL).   Medications:  Infusions:  . heparin 1,600 Units/hr (04/12/18 2103)    Assessment: Patient with low heparin level.  No heparin issues per RN.  Goal of Therapy:  Heparin level 0.3-0.7 units/ml Monitor platelets by anticoagulation protocol: Yes   Plan:  Increase heparin to 1750 units/hr Recheck level at [address], [place] 04/13/2018,6:45 AM

## 2018-04-13 NOTE — Discharge Summary (Signed)
Physician Discharge Summary   Patient ID: David Clarke MRN: [id number] DOB/AGE: 1956/08/17 62 y.o.  Admit date: 04/10/2018 Discharge date: 04/13/2018  Primary Care Physician:  Robyn Haber, MD   Recommendations for Outpatient Follow-up:  1. Follow up with Dr. Burr Medico next week 2. Patient started on Xarelto 50 mg twice a day for 21 days and then 20 mg daily starting 9/7.  Patient was provided Xarelto education   Home Health: None Equipment/Devices: None  Discharge Condition: stable  CODE STATUS: FULL Diet recommendation: Regular diet   Discharge Diagnoses:   Right lower extremity DVT . Rectal adenocarcinoma ([place]) . Acute pulmonary embolism ([place]) . Thrombocytopenia ([place]) . Normocytic anemia Severe protein calorie malnutrition  Consults: Oncology, Dr. Burr Medico    Allergies:   Allergies  Allergen Reactions  . Sulfa Antibiotics Nausea Only and Other (See Comments)    Reaction may years ago, pt thinks nausea only.      DISCHARGE MEDICATIONS: Allergies as of 04/13/2018      Reactions   Sulfa Antibiotics Nausea Only, Other (See Comments)   Reaction may years ago, pt thinks nausea only.       Medication List    STOP taking these medications   potassium chloride SA 20 MEQ tablet Commonly known as:  K-DUR,KLOR-CON     TAKE these medications   clindamycin 1 % gel Commonly known as:  CLINDAGEL Apply topically 2 (two) times daily.   ibuprofen 200 MG tablet Commonly known as:  ADVIL,MOTRIN Take 200-600 mg by mouth daily as needed for moderate pain.   magnesium oxide 400 (241.3 Mg) MG tablet Commonly known as:  MAG-OX Take 1 tablet (400 mg total) by mouth 2 (two) times daily.   multivitamin with minerals Tabs tablet Take 1 tablet by mouth every other day.   nystatin 100000 UNIT/ML suspension Commonly known as:  MYCOSTATIN Take 5 mLs (500,000 Units total) by mouth 4 (four) times daily.   omeprazole 40 MG capsule Commonly known as:  PRILOSEC Take 1 capsule (40  mg total) by mouth daily.   Rivaroxaban 15 & 20 MG Tbpk Take as directed on package: Start with one 15mg  tablet by mouth twice a day with food. On Day 22 (on 05/04/2018), switch to one 20mg  tablet once a day with food.   rivaroxaban 20 MG Tabs tablet Commonly known as:  XARELTO Take 1 tablet (20 mg total) by mouth daily with supper. Please start this dose after you have completed the starter oack Start taking on:  05/04/2018   THC FREE PO Inhale into the lungs. THC Oil via vape as needed for sleep        Brief H and P: For complete details please refer to admission H and P, but in [place] patient is a 62 year old male with medical history significant for metastatic rectal adenocarcinoma and neuropathy who presented to the emergency department after being sent by the oncology office with right leg pain and a CT that showed PE and progression of metastatic disease.  Patient was admitted with working diagnosis of PE and started on heparin drip.   Hospital Course:   Acute pulmonary embolism right lower lung with right heart strain, right lower extremity DVT in the setting of malignancy -CT angiogram of the chest showed acute RLE and embolism with right heart strain.  Venous Doppler lower extremity showed right lower extremity DVT at the saphenofemoral junction -Patient was placed on heparin drip, oncology was consulted -2D echo showed grade 1 diastolic dysfunction otherwise  normal right ventricular dysfunction. -Platelets improving today 86K, transitioned to Xarelto per oncology recommendations and provided Xarelto education.  Patient will follow-up with his oncologist, Dr. Burr Medico next week.   Normocytic anemia -Likely anemia of chronic disease, patient was transfused 1 unit packed RBCs, hemoglobin currently at baseline 8.5, -No signs of overt bleeding  Acute thrombocytopenia -Likely due to chemotherapy, currently no signs of bleeding -Patient was admitted with platelets 72,000 on  8/14, plummeted down to 58,000 on 8/15, now improving to 86,000 at the time of discharge.  Rectal adenocarcinoma New CT scans that showed progressive disease, metastasis.  Oncology aware and further management per Dr. Burr Medico   Severe protein calorie malnutrition -Patient recommended nutritional supplements  Day of Discharge S: No acute issues, overall starting to feel better  BP 117/85 (BP Location: Left Arm)   Pulse 79   Temp 99.6 F (37.6 C) (Oral)   Resp 18   Ht 6' (1.829 m)   Wt 56.2 kg   SpO2 99%   BMI 16.79 kg/m   Physical Exam: General: Alert and awake oriented x3 not in any acute distress. HEENT: anicteric sclera, pupils reactive to light and accommodation CVS: S1-S2 clear no murmur rubs or gallops Chest: clear to auscultation bilaterally, no wheezing rales or rhonchi, port in the right chest Abdomen: soft nontender, nondistended, normal bowel sounds, ostomy Extremities: no cyanosis, clubbing or edema noted bilaterally Neuro: Cranial nerves II-XII intact, no focal neurological deficits   The results of significant diagnostics from this hospitalization (including imaging, microbiology, ancillary and laboratory) are listed below for reference.      Procedures/Studies:  Ct Chest W Contrast  Result Date: 04/10/2018 CLINICAL DATA:  Stage IV rectal adenocarcinoma, presenting for restaging with ongoing chemotherapy. EXAM: CT CHEST, ABDOMEN, AND PELVIS WITH CONTRAST TECHNIQUE: Multidetector CT imaging of the chest, abdomen and pelvis was performed following the standard protocol during bolus administration of intravenous contrast. CONTRAST:  115mL OMNIPAQUE IOHEXOL 300 MG/ML  SOLN COMPARISON:  01/08/2018 CT chest, abdomen and pelvis. FINDINGS: CT CHEST FINDINGS Cardiovascular: Normal heart size. No significant pericardial effusion/thickening. Right internal jugular MediPort terminates at the cavoatrial junction. Great vessels are normal in course and caliber. There is an  acute pulmonary embolus to the right lower lobe involving lobar, segmental and subsegmental branches. Elevated RV/LV ratio 1.08. Mediastinum/Nodes: No discrete thyroid nodules. Unremarkable esophagus. Mildly enlarged 1.3 cm left supraclavicular node (series 2/image 6) is decreased from 2.5 cm. No axillary adenopathy. No mediastinal or hilar adenopathy. Lungs/Pleura: No pneumothorax. No pleural effusion. Numerous solid pulmonary masses and nodules throughout both lungs (greater than 30), significantly increased in size and number. For example, a 3.3 cm right middle lobe mass (series 9/image 129), increased from 1.8 cm. Basilar left lower lobe 2.3 cm nodule (series 9/image 136) is increased from 0.8 cm. Left upper lobe 1.9 cm nodule (series 9/image 49) is increased from 0.8 cm. Musculoskeletal:  No discrete focal osseous lesions in the chest. CT ABDOMEN PELVIS FINDINGS Hepatobiliary: Liver is enlarged by numerous (greater than 10) bulky heterogeneous hypoenhancing masses, significantly increased in size and number. For example, an 8.0 x 7.5 cm lateral segment left liver lobe mass (series 2/image 71), increased from 2.8 x 1.8 cm. Inferior right liver lobe 7.8 x 4.7 cm mass (series 2/image 74), increased from 2.1 x 2.1 cm. Right liver dome 9.7 x 8.3 cm mass (series 2/image 56), increased from 1.1 x 1.0 cm. Normal gallbladder with no radiopaque cholelithiasis. No biliary ductal dilatation. Pancreas: Normal, with no mass  or duct dilation. Spleen: Normal size. No mass. Adrenals/Urinary Tract: No discrete adrenal nodules. Mild-to-moderate right hydroureteronephrosis to the level of mid right lumbar ureter is mildly increased. No left hydronephrosis. Contrast nephrograms are symmetric and within normal limits. Subcentimeter hypodense renal cortical lesions in the upper right kidney are too small to characterize and are stable. No new renal lesions. Bladder is collapsed with stable chronic mild diffuse bladder wall thickening  and perivesical fat haziness. Stomach/Bowel: Normal non-distended stomach. Moderate right inguinal hernia containing multiple right pelvic small bowel loops, unchanged. No small bowel dilatation or wall thickening. Oral contrast transits to the colon. Appendix appears normal. Stable postsurgical changes from end sigmoid colostomy in the ventral left abdominal wall with no wall thickening or acute pericolonic fat stranding in the remnant large-bowel. Stable postsurgical and post treatment changes from abdominoperineal resection, with mildly heterogeneous soft tissue density in the presacral space measuring up to 3.4 cm thickness (series 2/image 113), stable. Vascular/Lymphatic: Atherosclerotic nonaneurysmal abdominal aorta. Patent portal, hepatic, splenic and renal veins. Aortocaval adenopathy measures up to 3.5 cm (series 2/image 77), decreased from 4.3 cm. No new adenopathy in the abdomen or pelvis. Reproductive: Stable top-normal size prostate. Other: No pneumoperitoneum. No ascites. No focal fluid collections. Heterogeneous hypodense right psoas muscle 11.2 x 10.6 cm mass (series 2/image 80), previously 11.8 x 11.1 cm, not appreciably changed. Musculoskeletal: No discrete osseous lesions. Mild lumbar spondylosis. IMPRESSION: 1. Acute pulmonary embolism in the right lower lobe, extending proximally to the lobar branch. 2. Positive for acute PE with CT evidence of right heart strain (RV/LV Ratio = 1.1) consistent with at least submassive (intermediate risk) PE. The presence of right heart strain has been associated with an increased risk of morbidity and mortality. 3. Overall substantial progression of metastatic disease, although there are mixed changes as detailed below. 4. Pulmonary metastases have significantly increased in size and number. 5. Liver metastases have significantly increased in size and number. 6. Left supraclavicular and retroperitoneal adenopathy is decreased. 7. Large right psoas muscle  metastasis is stable. 8. Mild to moderate right hydroureteronephrosis is mildly increased, although there is no evidence of a delayed right contrast nephrogram, suggesting that the degree of ureteral obstruction is low-grade at this time. 9. Stable right inguinal hernia containing small bowel loops without bowel complication. 10. Stable postsurgical/post-treatment changes in the presacral space from APR. 11.  Aortic Atherosclerosis (ICD10-I70.0). Critical Value/emergent results were called by telephone at the time of interpretation on 04/10/2018 at 2:43 pm to Dr. Truitt Merle , who verbally acknowledged these results. Electronically Signed   By: Ilona Sorrel M.D.   On: 04/10/2018 14:47   Ct Abdomen Pelvis W Contrast  Result Date: 04/10/2018 CLINICAL DATA:  Stage IV rectal adenocarcinoma, presenting for restaging with ongoing chemotherapy. EXAM: CT CHEST, ABDOMEN, AND PELVIS WITH CONTRAST TECHNIQUE: Multidetector CT imaging of the chest, abdomen and pelvis was performed following the standard protocol during bolus administration of intravenous contrast. CONTRAST:  191mL OMNIPAQUE IOHEXOL 300 MG/ML  SOLN COMPARISON:  01/08/2018 CT chest, abdomen and pelvis. FINDINGS: CT CHEST FINDINGS Cardiovascular: Normal heart size. No significant pericardial effusion/thickening. Right internal jugular MediPort terminates at the cavoatrial junction. Great vessels are normal in course and caliber. There is an acute pulmonary embolus to the right lower lobe involving lobar, segmental and subsegmental branches. Elevated RV/LV ratio 1.08. Mediastinum/Nodes: No discrete thyroid nodules. Unremarkable esophagus. Mildly enlarged 1.3 cm left supraclavicular node (series 2/image 6) is decreased from 2.5 cm. No axillary adenopathy. No mediastinal or hilar  adenopathy. Lungs/Pleura: No pneumothorax. No pleural effusion. Numerous solid pulmonary masses and nodules throughout both lungs (greater than 30), significantly increased in size and  number. For example, a 3.3 cm right middle lobe mass (series 9/image 129), increased from 1.8 cm. Basilar left lower lobe 2.3 cm nodule (series 9/image 136) is increased from 0.8 cm. Left upper lobe 1.9 cm nodule (series 9/image 49) is increased from 0.8 cm. Musculoskeletal:  No discrete focal osseous lesions in the chest. CT ABDOMEN PELVIS FINDINGS Hepatobiliary: Liver is enlarged by numerous (greater than 10) bulky heterogeneous hypoenhancing masses, significantly increased in size and number. For example, an 8.0 x 7.5 cm lateral segment left liver lobe mass (series 2/image 71), increased from 2.8 x 1.8 cm. Inferior right liver lobe 7.8 x 4.7 cm mass (series 2/image 74), increased from 2.1 x 2.1 cm. Right liver dome 9.7 x 8.3 cm mass (series 2/image 56), increased from 1.1 x 1.0 cm. Normal gallbladder with no radiopaque cholelithiasis. No biliary ductal dilatation. Pancreas: Normal, with no mass or duct dilation. Spleen: Normal size. No mass. Adrenals/Urinary Tract: No discrete adrenal nodules. Mild-to-moderate right hydroureteronephrosis to the level of mid right lumbar ureter is mildly increased. No left hydronephrosis. Contrast nephrograms are symmetric and within normal limits. Subcentimeter hypodense renal cortical lesions in the upper right kidney are too small to characterize and are stable. No new renal lesions. Bladder is collapsed with stable chronic mild diffuse bladder wall thickening and perivesical fat haziness. Stomach/Bowel: Normal non-distended stomach. Moderate right inguinal hernia containing multiple right pelvic small bowel loops, unchanged. No small bowel dilatation or wall thickening. Oral contrast transits to the colon. Appendix appears normal. Stable postsurgical changes from end sigmoid colostomy in the ventral left abdominal wall with no wall thickening or acute pericolonic fat stranding in the remnant large-bowel. Stable postsurgical and post treatment changes from abdominoperineal  resection, with mildly heterogeneous soft tissue density in the presacral space measuring up to 3.4 cm thickness (series 2/image 113), stable. Vascular/Lymphatic: Atherosclerotic nonaneurysmal abdominal aorta. Patent portal, hepatic, splenic and renal veins. Aortocaval adenopathy measures up to 3.5 cm (series 2/image 77), decreased from 4.3 cm. No new adenopathy in the abdomen or pelvis. Reproductive: Stable top-normal size prostate. Other: No pneumoperitoneum. No ascites. No focal fluid collections. Heterogeneous hypodense right psoas muscle 11.2 x 10.6 cm mass (series 2/image 80), previously 11.8 x 11.1 cm, not appreciably changed. Musculoskeletal: No discrete osseous lesions. Mild lumbar spondylosis. IMPRESSION: 1. Acute pulmonary embolism in the right lower lobe, extending proximally to the lobar branch. 2. Positive for acute PE with CT evidence of right heart strain (RV/LV Ratio = 1.1) consistent with at least submassive (intermediate risk) PE. The presence of right heart strain has been associated with an increased risk of morbidity and mortality. 3. Overall substantial progression of metastatic disease, although there are mixed changes as detailed below. 4. Pulmonary metastases have significantly increased in size and number. 5. Liver metastases have significantly increased in size and number. 6. Left supraclavicular and retroperitoneal adenopathy is decreased. 7. Large right psoas muscle metastasis is stable. 8. Mild to moderate right hydroureteronephrosis is mildly increased, although there is no evidence of a delayed right contrast nephrogram, suggesting that the degree of ureteral obstruction is low-grade at this time. 9. Stable right inguinal hernia containing small bowel loops without bowel complication. 10. Stable postsurgical/post-treatment changes in the presacral space from APR. 11.  Aortic Atherosclerosis (ICD10-I70.0). Critical Value/emergent results were called by telephone at the time of  interpretation on 04/10/2018 at  2:43 pm to Dr. Truitt Merle , who verbally acknowledged these results. Electronically Signed   By: Ilona Sorrel M.D.   On: 04/10/2018 14:47      LAB RESULTS: Basic Metabolic Panel: Recent Labs  Lab 04/10/18 1705 04/10/18 1728 04/11/18 0034  NA 134* 131* 134*  K 4.3 4.2 4.0  CL 101 99 103  CO2 21*  --  21*  GLUCOSE 113* 111* 110*  BUN 22 20 20   CREATININE 0.89 0.80 0.83  CALCIUM 8.4*  --  8.0*  MG  --   --  1.8  PHOS  --   --  1.1*   Liver Function Tests: Recent Labs  Lab 04/10/18 1705 04/11/18 0034  AST 33 30  ALT 35 29  ALKPHOS 685* 599*  BILITOT 0.8 0.7  PROT 6.1* 5.5*  ALBUMIN 2.9* 2.6*   No results for input(s): LIPASE, AMYLASE in the last 168 hours. No results for input(s): AMMONIA in the last 168 hours. CBC: Recent Labs  Lab 04/10/18 1705  04/12/18 0605 04/13/18 0523  WBC 13.1*   < > 4.6 5.0  NEUTROABS 12.4*  --   --   --   HGB 7.7*   < > 8.2* 8.5*  HCT 23.6*   < > 24.4* 25.5*  MCV 98.3   < > 94.6 93.8  PLT 72*   < > 61* 86*   < > = values in this interval not displayed.   Cardiac Enzymes: Recent Labs  Lab 04/11/18 0034 04/11/18 0732  TROPONINI 0.03* 0.03*   BNP: Invalid input(s): POCBNP CBG: No results for input(s): GLUCAP in the last 168 hours.    Disposition and Follow-up: Discharge Instructions    Diet general   Complete by:  As directed    Increase activity slowly   Complete by:  As directed        DISPOSITION: [facility]    Truitt Merle, MD. Schedule an appointment as soon as possible for a visit in 2 week(s).   Specialties:  Hematology, Oncology Contact information: [place] [address] [phone]            Time coordinating discharge:  40 minutes  Signed:   Estill Cotta M.D. [facility] 04/13/2018, 11:17 AM Pager: [phone]

## 2018-04-13 NOTE — Progress Notes (Signed)
Patient to be D/C'd home as ordered. Discharge paperwork explained to pt & girlfriend who verbalized understanding. MD also paged & spoke with pt and gf to answer questions they had. Port-a-cath deacessed- Heparin stopped and first dose Xarelto given. Pharmacy education done by pharmacist and RN. Both verbalized understanding. Both printed prescriptions given to pt and difference between the 2 pointed out. Pt requested to order banana pudding before D/C. Pt ready from nurse's standpoint- pt will request W/C when ready to go and nursing staff will assist him to family's vehicle.

## 2018-04-14 ENCOUNTER — Other Ambulatory Visit: Payer: Self-pay | Admitting: Hematology

## 2018-04-16 ENCOUNTER — Ambulatory Visit (INDEPENDENT_AMBULATORY_CARE_PROVIDER_SITE_OTHER): Payer: BLUE CROSS/BLUE SHIELD | Admitting: Podiatry

## 2018-04-16 ENCOUNTER — Encounter: Payer: Self-pay | Admitting: Podiatry

## 2018-04-16 DIAGNOSIS — M79676 Pain in unspecified toe(s): Secondary | ICD-10-CM | POA: Diagnosis not present

## 2018-04-16 DIAGNOSIS — G5790 Unspecified mononeuropathy of unspecified lower limb: Secondary | ICD-10-CM

## 2018-04-16 DIAGNOSIS — B351 Tinea unguium: Secondary | ICD-10-CM | POA: Diagnosis not present

## 2018-04-17 NOTE — Progress Notes (Signed)
Subjective:  Patient ID: David Clarke, male    DOB: 1956/05/06,  MRN: [id number] HPI Chief Complaint  Patient presents with  . Foot Pain    Patient currently in chemo - neuropathy in feet, swelling in extremities, needs toenails trimmed, extra dry skin-cracks   . New Patient (Initial Visit)    62 y.o. male presents with the above complaint.   ROS: Denies fever chills nausea vomiting muscle aches pains calf pain back pain chest pain shortness of breath.  Past Medical History:  Diagnosis Date  . Allergy   . Cancer ([place]) 10/09/2016   rectal adenocarcinoma  . Inguinal hernia    Past Surgical History:  Procedure Laterality Date  . COLONOSCOPY W/ BIOPSIES Bilateral 10/09/2016   rectal adenocarcinoma  . IR FLUORO GUIDE PORT INSERTION RIGHT  11/13/2017  . IR US GUIDE VASC ACCESS RIGHT  11/13/2017  . MOUTH SURGERY     age 30, to remove extra "eye" teeth  . [place] teeth extraction    . XI ROBOT ABDOMINAL PERINEAL RESECTION N/A 02/21/2017   Procedure: XI ROBOT ABDOMINOPERINEAL RESECTION WITH COLOSTOMY;  Surgeon: Michael Boston, MD;  Location: [facility];  Service: General;  Laterality: N/A;    Current Outpatient Medications:  .  Cannabinoids (THC FREE PO), Inhale into the lungs. THC Oil via vape as needed for sleep, Disp: , Rfl:  .  clindamycin (CLINDAGEL) 1 % gel, Apply topically 2 (two) times daily., Disp: 60 g, Rfl: 1 .  ibuprofen (ADVIL,MOTRIN) 200 MG tablet, Take 200-600 mg by mouth daily as needed for moderate pain., Disp: , Rfl:  .  magnesium oxide (MAG-OX) 400 (241.3 Mg) MG tablet, Take 1 tablet (400 mg total) by mouth 2 (two) times daily., Disp: 60 tablet, Rfl: 1 .  mirtazapine (REMERON) 7.5 MG tablet, , Disp: , Rfl: 2 .  Multiple Vitamin (MULTIVITAMIN WITH MINERALS) TABS tablet, Take 1 tablet by mouth every other day. , Disp: , Rfl:  .  nystatin (MYCOSTATIN) 100000 UNIT/ML suspension, Take 5 mLs (500,000 Units total) by mouth 4 (four) times daily., Disp: 473 mL, Rfl: 0 .  omeprazole  (PRILOSEC) 40 MG capsule, Take 1 capsule (40 mg total) by mouth daily., Disp: 30 capsule, Rfl: 2 .  [START ON 05/04/2018] rivaroxaban (XARELTO) 20 MG TABS tablet, Take 1 tablet (20 mg total) by mouth daily with supper. Please start this dose after you have completed the starter oack, Disp: 30 tablet, Rfl: 5 .  Rivaroxaban 15 & 20 MG TBPK, Take as directed on package: Start with one 15mg  tablet by mouth twice a day with food. On Day 22 (on 05/04/2018), switch to one 20mg  tablet once a day with food., Disp: 51 each, Rfl: 0  Allergies  Allergen Reactions  . Sulfa Antibiotics Nausea Only and Other (See Comments)    Reaction may years ago, pt thinks nausea only.    Review of Systems Objective:  There were no vitals filed for this visit.  General: Well developed, nourished, in no acute distress, alert and oriented x3   Dermatological: Skin is warm, dry and supple bilateral. Nails x 10 are well maintained; remaining integument appears unremarkable at this time. There are no open sores, no preulcerative lesions, no rash or signs of infection present.  Toenails are long thick yellow dystrophic clinically mycotic painful on palpation and debridement.  Vascular: Dorsalis Pedis artery and Posterior Tibial artery pedal pulses are 2/4 bilateral with immedate capillary fill time. Pedal hair growth present. No varicosities and no lower extremity  edema present bilateral.   Neruologic: Grossly intact via light touch bilateral. Vibratory intact via tuning fork bilateral. Protective threshold with Semmes Wienstein monofilament intact to all pedal sites bilateral. Patellar and Achilles deep tendon reflexes 2+ bilateral. No Babinski or clonus noted bilateral.  Slight diminished sensation per Semmes Weinstein monofilament to the tops of the toes and midfoot.  Nonpainful.  Musculoskeletal: No gross boney pedal deformities bilateral. No pain, crepitus, or limitation noted with foot and ankle range of motion bilateral.  Muscular strength 5/5 in all groups tested bilateral.  Gait: Unassisted, Nonantalgic.    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  Plan: Debrided painful elongated toenails and recommended an over-the-counter cream to help with the dry cracked skin.     Mylisa Brunson T. Hatch, [place]

## 2018-04-18 ENCOUNTER — Ambulatory Visit: Payer: BLUE CROSS/BLUE SHIELD

## 2018-04-18 ENCOUNTER — Inpatient Hospital Stay (HOSPITAL_BASED_OUTPATIENT_CLINIC_OR_DEPARTMENT_OTHER): Payer: BLUE CROSS/BLUE SHIELD | Admitting: Nurse Practitioner

## 2018-04-18 ENCOUNTER — Inpatient Hospital Stay: Payer: BLUE CROSS/BLUE SHIELD

## 2018-04-18 ENCOUNTER — Telehealth: Payer: Self-pay

## 2018-04-18 ENCOUNTER — Encounter: Payer: Self-pay | Admitting: Nurse Practitioner

## 2018-04-18 VITALS — BP 90/66 | HR 108 | Temp 97.7°F | Resp 18 | Ht 72.0 in | Wt 124.8 lb

## 2018-04-18 VITALS — BP 125/82 | HR 96 | Temp 98.2°F | Resp 18

## 2018-04-18 DIAGNOSIS — I82442 Acute embolism and thrombosis of left tibial vein: Secondary | ICD-10-CM

## 2018-04-18 DIAGNOSIS — I2699 Other pulmonary embolism without acute cor pulmonale: Secondary | ICD-10-CM

## 2018-04-18 DIAGNOSIS — C787 Secondary malignant neoplasm of liver and intrahepatic bile duct: Secondary | ICD-10-CM

## 2018-04-18 DIAGNOSIS — R5383 Other fatigue: Secondary | ICD-10-CM

## 2018-04-18 DIAGNOSIS — Z5112 Encounter for antineoplastic immunotherapy: Secondary | ICD-10-CM | POA: Diagnosis present

## 2018-04-18 DIAGNOSIS — I82811 Embolism and thrombosis of superficial veins of right lower extremities: Secondary | ICD-10-CM

## 2018-04-18 DIAGNOSIS — C20 Malignant neoplasm of rectum: Secondary | ICD-10-CM

## 2018-04-18 DIAGNOSIS — R634 Abnormal weight loss: Secondary | ICD-10-CM | POA: Diagnosis not present

## 2018-04-18 DIAGNOSIS — Z95828 Presence of other vascular implants and grafts: Secondary | ICD-10-CM

## 2018-04-18 DIAGNOSIS — D72829 Elevated white blood cell count, unspecified: Secondary | ICD-10-CM

## 2018-04-18 DIAGNOSIS — D500 Iron deficiency anemia secondary to blood loss (chronic): Secondary | ICD-10-CM

## 2018-04-18 DIAGNOSIS — M79604 Pain in right leg: Secondary | ICD-10-CM | POA: Diagnosis not present

## 2018-04-18 DIAGNOSIS — E876 Hypokalemia: Secondary | ICD-10-CM

## 2018-04-18 DIAGNOSIS — Z933 Colostomy status: Secondary | ICD-10-CM

## 2018-04-18 DIAGNOSIS — I82890 Acute embolism and thrombosis of other specified veins: Secondary | ICD-10-CM

## 2018-04-18 DIAGNOSIS — G589 Mononeuropathy, unspecified: Secondary | ICD-10-CM | POA: Diagnosis not present

## 2018-04-18 DIAGNOSIS — T451X5A Adverse effect of antineoplastic and immunosuppressive drugs, initial encounter: Secondary | ICD-10-CM

## 2018-04-18 DIAGNOSIS — B370 Candidal stomatitis: Secondary | ICD-10-CM

## 2018-04-18 DIAGNOSIS — I82411 Acute embolism and thrombosis of right femoral vein: Secondary | ICD-10-CM

## 2018-04-18 DIAGNOSIS — R630 Anorexia: Secondary | ICD-10-CM | POA: Diagnosis not present

## 2018-04-18 DIAGNOSIS — C778 Secondary and unspecified malignant neoplasm of lymph nodes of multiple regions: Secondary | ICD-10-CM

## 2018-04-18 DIAGNOSIS — R600 Localized edema: Secondary | ICD-10-CM | POA: Diagnosis not present

## 2018-04-18 DIAGNOSIS — K5900 Constipation, unspecified: Secondary | ICD-10-CM

## 2018-04-18 DIAGNOSIS — C7989 Secondary malignant neoplasm of other specified sites: Secondary | ICD-10-CM | POA: Diagnosis not present

## 2018-04-18 DIAGNOSIS — R0609 Other forms of dyspnea: Secondary | ICD-10-CM | POA: Diagnosis not present

## 2018-04-18 DIAGNOSIS — Z87891 Personal history of nicotine dependence: Secondary | ICD-10-CM | POA: Diagnosis not present

## 2018-04-18 DIAGNOSIS — L270 Generalized skin eruption due to drugs and medicaments taken internally: Secondary | ICD-10-CM

## 2018-04-18 DIAGNOSIS — R000 Tachycardia, unspecified: Secondary | ICD-10-CM | POA: Diagnosis not present

## 2018-04-18 DIAGNOSIS — Z5111 Encounter for antineoplastic chemotherapy: Secondary | ICD-10-CM | POA: Diagnosis not present

## 2018-04-18 DIAGNOSIS — C7800 Secondary malignant neoplasm of unspecified lung: Secondary | ICD-10-CM

## 2018-04-18 DIAGNOSIS — I519 Heart disease, unspecified: Secondary | ICD-10-CM | POA: Diagnosis not present

## 2018-04-18 DIAGNOSIS — R21 Rash and other nonspecific skin eruption: Secondary | ICD-10-CM | POA: Diagnosis not present

## 2018-04-18 DIAGNOSIS — G620 Drug-induced polyneuropathy: Secondary | ICD-10-CM

## 2018-04-18 DIAGNOSIS — R0602 Shortness of breath: Secondary | ICD-10-CM

## 2018-04-18 DIAGNOSIS — Z7901 Long term (current) use of anticoagulants: Secondary | ICD-10-CM

## 2018-04-18 DIAGNOSIS — I82441 Acute embolism and thrombosis of right tibial vein: Secondary | ICD-10-CM

## 2018-04-18 DIAGNOSIS — D649 Anemia, unspecified: Secondary | ICD-10-CM | POA: Diagnosis not present

## 2018-04-18 LAB — CBC WITH DIFFERENTIAL/PLATELET
BASOS ABS: 0.1 10*3/uL (ref 0.0–0.1)
BASOS PCT: 0 %
Eosinophils Absolute: 0 10*3/uL (ref 0.0–0.5)
Eosinophils Relative: 0 %
HEMATOCRIT: 26.5 % — AB (ref 38.4–49.9)
Hemoglobin: 8.5 g/dL — ABNORMAL LOW (ref 13.0–17.1)
Lymphocytes Relative: 3 %
Lymphs Abs: 1 10*3/uL (ref 0.9–3.3)
MCH: 30.7 pg (ref 27.2–33.4)
MCHC: 32 g/dL (ref 32.0–36.0)
MCV: 95.9 fL (ref 79.3–98.0)
Monocytes Absolute: 2.6 10*3/uL — ABNORMAL HIGH (ref 0.1–0.9)
Monocytes Relative: 7 %
NEUTROS ABS: 33.8 10*3/uL — AB (ref 1.5–6.5)
NEUTROS PCT: 90 %
Platelets: 289 10*3/uL (ref 140–400)
RBC: 2.76 MIL/uL — ABNORMAL LOW (ref 4.20–5.82)
RDW: 19.1 % — AB (ref 11.0–14.6)
WBC: 37.5 10*3/uL — ABNORMAL HIGH (ref 4.0–10.3)

## 2018-04-18 LAB — COMPREHENSIVE METABOLIC PANEL
ALBUMIN: 2.7 g/dL — AB (ref 3.5–5.0)
ALT: 19 U/L (ref 0–44)
AST: 47 U/L — AB (ref 15–41)
Alkaline Phosphatase: 937 U/L — ABNORMAL HIGH (ref 38–126)
Anion gap: 13 (ref 5–15)
BUN: 14 mg/dL (ref 8–23)
CHLORIDE: 99 mmol/L (ref 98–111)
CO2: 22 mmol/L (ref 22–32)
Calcium: 8.8 mg/dL — ABNORMAL LOW (ref 8.9–10.3)
Creatinine, Ser: 0.78 mg/dL (ref 0.61–1.24)
GFR calc Af Amer: >60 mL/min (ref >60)
GFR calc non Af Amer: >60 mL/min (ref >60)
GLUCOSE: 118 mg/dL — AB (ref 70–99)
POTASSIUM: 3.7 mmol/L (ref 3.5–5.1)
Sodium: 134 mmol/L — ABNORMAL LOW (ref 135–145)
Total Bilirubin: 0.7 mg/dL (ref 0.3–1.2)
Total Protein: 6.1 g/dL — ABNORMAL LOW (ref 6.5–8.1)

## 2018-04-18 LAB — IRON AND TIBC
Iron: 37 ug/dL — ABNORMAL LOW (ref 42–163)
SATURATION RATIOS: 23 % — AB (ref 42–163)
TIBC: 163 ug/dL — AB (ref 202–409)
UIBC: 126 ug/dL

## 2018-04-18 LAB — MAGNESIUM: MAGNESIUM: 1.5 mg/dL — AB (ref 1.7–2.4)

## 2018-04-18 LAB — CEA (IN HOUSE-CHCC): CEA (CHCC-In House): 9.16 ng/mL — ABNORMAL HIGH (ref 0.00–5.00)

## 2018-04-18 LAB — FERRITIN: Ferritin: 2945 ng/mL — ABNORMAL HIGH (ref 24–336)

## 2018-04-18 MED ORDER — HEPARIN SOD (PORK) LOCK FLUSH 100 UNIT/ML IV SOLN
500.0000 [IU] | Freq: Once | INTRAVENOUS | Status: AC | PRN
  Administered 2018-04-18: 500 [IU]
  Filled 2018-04-18: qty 5

## 2018-04-18 MED ORDER — SODIUM CHLORIDE 0.9% FLUSH
10.0000 mL | Freq: Once | INTRAVENOUS | Status: AC
  Administered 2018-04-18: 10 mL
  Filled 2018-04-18: qty 10

## 2018-04-18 MED ORDER — SODIUM CHLORIDE 0.9 % IV SOLN
Freq: Once | INTRAVENOUS | Status: AC
  Administered 2018-04-18: 15:00:00 via INTRAVENOUS
  Filled 2018-04-18: qty 250

## 2018-04-18 MED ORDER — HEPARIN SOD (PORK) LOCK FLUSH 100 UNIT/ML IV SOLN
250.0000 [IU] | Freq: Once | INTRAVENOUS | Status: DC | PRN
  Filled 2018-04-18: qty 5

## 2018-04-18 MED ORDER — SODIUM CHLORIDE 0.9% FLUSH
10.0000 mL | INTRAVENOUS | Status: DC | PRN
  Administered 2018-04-18: 10 mL
  Filled 2018-04-18: qty 10

## 2018-04-18 MED ORDER — ALTEPLASE 2 MG IJ SOLR
2.0000 mg | Freq: Once | INTRAMUSCULAR | Status: DC | PRN
  Filled 2018-04-18: qty 2

## 2018-04-18 NOTE — Patient Instructions (Signed)
Dehydration, Adult Dehydration is when there is not enough fluid or water in your body. This happens when you lose more fluids than you take in. Dehydration can range from mild to very bad. It should be treated right away to keep it from getting very bad. Symptoms of mild dehydration may include:  Thirst.  Dry lips.  Slightly dry mouth.  Dry, warm skin.  Dizziness. Symptoms of moderate dehydration may include:  Very dry mouth.  Muscle cramps.  Dark pee (urine). Pee may be the color of tea.  Your body making less pee.  Your eyes making fewer tears.  Heartbeat that is uneven or faster than normal (palpitations).  Headache.  Light-headedness, especially when you stand up from sitting.  Fainting (syncope). Symptoms of very bad dehydration may include:  Changes in skin, such as: ? Cold and clammy skin. ? Blotchy (mottled) or pale skin. ? Skin that does not quickly return to normal after being lightly pinched and let go (poor skin turgor).  Changes in body fluids, such as: ? Feeling very thirsty. ? Your eyes making fewer tears. ? Not sweating when body temperature is high, such as in hot weather. ? Your body making very little pee.  Changes in vital signs, such as: ? Weak pulse. ? Pulse that is more than 100 beats a minute when you are sitting still. ? Fast breathing. ? Low blood pressure.  Other changes, such as: ? Sunken eyes. ? Cold hands and feet. ? Confusion. ? Lack of energy (lethargy). ? Trouble waking up from sleep. ? Short-term weight loss. ? Unconsciousness. Follow these instructions at home:  If told by your doctor, drink an ORS: ? Make an ORS by using instructions on the package. ? Start by drinking small amounts, about  cup (120 mL) every 5-10 minutes. ? Slowly drink more until you have had the amount that your doctor said to have.  Drink enough clear fluid to keep your pee clear or pale yellow. If you were told to drink an ORS, finish the ORS  first, then start slowly drinking clear fluids. Drink fluids such as: ? Water. Do not drink only water by itself. Doing that can make the salt (sodium) level in your body get too low (hyponatremia). ? Ice chips. ? Fruit juice that you have added water to (diluted). ? Low-calorie sports drinks.  Avoid: ? Alcohol. ? Drinks that have a lot of sugar. These include high-calorie sports drinks, fruit juice that does not have water added, and soda. ? Caffeine. ? Foods that are greasy or have a lot of fat or sugar.  Take over-the-counter and prescription medicines only as told by your doctor.  Do not take salt tablets. Doing that can make the salt level in your body get too high (hypernatremia).  Eat foods that have minerals (electrolytes). Examples include bananas, oranges, potatoes, tomatoes, and spinach.  Keep all follow-up visits as told by your doctor. This is important. Contact a doctor if:  You have belly (abdominal) pain that: ? Gets worse. ? Stays in one area (localizes).  You have a rash.  You have a stiff neck.  You get angry or annoyed more easily than normal (irritability).  You are more sleepy than normal.  You have a harder time waking up than normal.  You feel: ? Weak. ? Dizzy. ? Very thirsty.  You have peed (urinated) only a small amount of very dark pee during 6-8 hours. Get help right away if:  You have symptoms of   very bad dehydration.  You cannot drink fluids without throwing up (vomiting).  Your symptoms get worse with treatment.  You have a fever.  You have a very bad headache.  You are throwing up or having watery poop (diarrhea) and it: ? Gets worse. ? Does not go away.  You have blood or something green (bile) in your throw-up.  You have blood in your poop (stool). This may cause poop to look black and tarry.  You have not peed in 6-8 hours.  You pass out (faint).  Your heart rate when you are sitting still is more than 100 beats a  minute.  You have trouble breathing. This information is not intended to replace advice given to you by your health care provider. Make sure you discuss any questions you have with your health care provider. Document Released: 06/10/2009 Document Revised: 03/03/2016 Document Reviewed: 10/08/2015 Elsevier Interactive Patient Education  2018 Elsevier Inc.  

## 2018-04-18 NOTE — Telephone Encounter (Signed)
PRINTED AVS AND CALENDER OF UPCOMING APPOINTMENT. PER 8/22 [facility] ADDED IV 1.5 FLUIDS PER CHARGE RN (AMY) OKAYED TO FENG, ALLSO VERIFIED IT

## 2018-04-18 NOTE — Patient Instructions (Signed)
Trifluridine; Tipiracil oral tablets What is this medicine? TRIFLURIDINE; TIPIRACIL (trye FLURE i deen; tip EER a sil) is a chemotherapy drug. It slows the growth of cancer cells. This medicine is used to treat colon or rectal cancer. This medicine may be used for other purposes; ask your health care provider or pharmacist if you have questions. COMMON BRAND NAME(S): Lonsurf What should I tell my health care provider before I take this medicine? They need to know if you have any of these conditions: -bleeding disorders -history of blood diseases, like sickle cell anemia or leukemia -history of low blood counts caused by a medicine -infection (especially a virus infection such as chickenpox, cold sores, or herpes) -kidney disease -liver disease -low blood counts, like low white cell, platelet, or red cell counts -recent or ongoing radiation therapy -an unusual or allergic reaction to trifluridine, tipiracil, other medicines, foods, dyes, or preservatives -pregnant or trying to get pregnant -breast-feeding How should I use this medicine? This medicine comes in two strengths, and your doctor may prescribe both strengths for your prescribed dose. Follow the directions on the prescription label. Take this medicine by mouth with a glass of water. Take this medicine within 1 hour after your morning or evening meals. Take your medicine at regular intervals. Do not take it more often than directed. Your caregiver should wear gloves when handling this medicine. Wash your hands after handling this medicine. Do not stop taking except on your doctor's advice. Talk to your pediatrician regarding the use of this medicine in children. Special care may be needed. Overdosage: If you think you have taken too much of this medicine contact a poison control center or emergency room at once. NOTE: This medicine is only for you. Do not share this medicine with others. What if I miss a dose? If you miss a dose, do  not take additional doses to make up for the missed dose. Call your doctor for instructions about what to do for a missed dose. What may interact with this medicine? Interactions have not been studied. Give your health care provider a list of all the medicines, herbs, non-prescription drugs, or dietary supplements you use. Also tell them if you smoke, drink alcohol, or use illegal drugs. Some items may interact with your medicine. This list may not describe all possible interactions. Give your health care provider a list of all the medicines, herbs, non-prescription drugs, or dietary supplements you use. Also tell them if you smoke, drink alcohol, or use illegal drugs. Some items may interact with your medicine. What should I watch for while using this medicine? Tell your doctor or healthcare professional if your symptoms do not start to get better or if they get worse. Do not become pregnant while taking this medicine. Women should inform their doctor if they wish to become pregnant or think they might be pregnant. Men should not father a child while taking this medicine and for 3 months after stopping it. There is a potential for serious side effects to an unborn child. Talk to your health care professional or pharmacist for more information. Do not breast-feed an infant while taking this medicine and for 1 day after the last dose. Avoid taking products that contain aspirin, acetaminophen, ibuprofen, naproxen, or ketoprofen unless instructed by your doctor. These medicines may hide a fever. Be careful brushing and flossing your teeth or using a toothpick because you may get an infection or bleed more easily. If you have any dental work done, tell your   dentist you are receiving this medicine. Call your doctor or health care professional for advice if you get a fever, chills or sore throat, or other symptoms of a cold or flu. Do not treat yourself. This drug decreases your body's ability to fight  infections. Try to avoid being around people who are sick. This medicine may increase your risk to bruise or bleed. Call your doctor or health care professional if you notice any unusual bleeding. This drug may make you feel generally unwell. This is not uncommon, as chemotherapy can affect healthy cells as well as cancer cells. Report any side effects. Continue your course of treatment even though you feel ill unless your doctor tells you to stop. What side effects may I notice from receiving this medicine? Side effects that you should report to your doctor or health care professional as soon as possible: -allergic reactions like skin rash, itching or hives, swelling of the face, lips, or tongue -low blood counts - this medicine may decrease the number of white blood cells, red blood cells and platelets. You may be at increased risk for infections and bleeding. -signs of decreased platelets or bleeding - bruising, pinpoint red spots on the skin, black, tarry stools, blood in the urine -signs of decreased red blood cells - unusually weak or tired, feeling faint or lightheaded, falls -signs of infection - fever or chills, cough, sore throat, pain or difficulty passing urine -signs and symptoms of a blood clot such as breathing problems; changes in vision; chest pain; severe, sudden headache; pain, swelling, warmth in the leg; trouble speaking; sudden numbness or weakness of the face, arm or leg Side effects that usually do not require medical attention (report to your doctor or health care professional if they continue or are bothersome): -diarrhea -loss of appetite -nausea, vomiting -stomach pain This list may not describe all possible side effects. Call your doctor for medical advice about side effects. You may report side effects to FDA at 1-800-FDA-1088. Where should I keep my medicine? Keep out of the reach of children. Store between 20 and 25 degrees C (68 and 77 degrees F). Throw away any  unused medicine after the expiration date. If kept outside of the original container, throw away any unused tablets after 30 days. NOTE: This sheet is a summary. It may not cover all possible information. If you have questions about this medicine, talk to your doctor, pharmacist, or health care provider.  2018 [employer]/Gold Standard (2016-03-01 13:48:20)  

## 2018-04-18 NOTE — Progress Notes (Addendum)
[place]  [phone] [phone]  Clinic Follow up Note   Patient Care Team: Robyn Haber, MD as PCP - General (Family Medicine) Michael Boston, MD as Consulting Physician (General Surgery) Pyrtle, Lajuan Lines, MD as Consulting Physician (Gastroenterology) Truitt Merle, MD as Consulting Physician (Hematology and Oncology) Kyung Rudd, MD as Consulting Physician (Radiation Oncology) 04/18/2018  SUMMARY OF ONCOLOGIC HISTORY: Oncology History   Cancer Staging Rectal adenocarcinoma [facility]) Staging form: Colon and Rectum, AJCC 8th Edition - Clinical stage from 10/09/2016: Stage IIIB (cT3, cN1b, cM0) - Signed by Truitt Merle, MD on 10/25/2016 - Pathologic stage from 02/21/2017: Stage IIA (ypT3, pN0, cM0) - Signed by Truitt Merle, MD on 10/18/2017       Rectal adenocarcinoma ([place])   10/03/2016 - 10/03/2016 Hospital Admission    Admit date: 10/03/16 The patient presented to the [facility] ED with rectal bleeding and difficulty with hygiene.    10/09/2016 Initial Diagnosis    Rectal adenocarcinoma     10/09/2016 Procedure    Colonoscopy showed a fungating, infiltrative and ulcerated partially obstructing large mass in the distal rectum, measuring 9 cm, prolapsing at anal canal. Scope not advanced proximal to the rectosigmoid colon.    10/09/2016 Initial Biopsy    Rectal mass biopsy showed invasive adenocarcinoma.    10/13/2016 Imaging    CT Chest Abdomen Pelvis w contrast IMPRESSION: Large irregular enhancing rectal mass consistent with known rectal cancer. There are small perirectal and small pelvic sidewall lymph nodes which are suspicious. Prominent perirectal vessels are also noted. No evidence of metastatic retroperitoneal or sigmoid mesocolon adenopathy or hepatic or pulmonary metastasis.    10/24/2016 Imaging    MRI Pelvis IMPRESSION: 1. Moderately motion degraded exam. 2. Large mass is centered at the anus with extension into the low rectum. No  complicating obstruction. 3. Mild mesorectal adenopathy, suspicious. 4. Right inguinal hernia containing nonobstructive small bowel.     11/08/2016 - 12/19/2016 Chemotherapy    Xeloda 50 mg twice daily, with concurrent radiation, held since 12/03/2016 due to colitis and hospitalization    11/08/2016 - 12/19/2016 Radiation Therapy    Neoadjuvant radiation to his rectal cancer  1) Pelvis/ 45 Gy in 25 fractions  2) Rectum boost/ 9 Gy in 5 fractions Under the care of Dr. Lisbeth Renshaw.    12/03/2016 - 12/09/2016 Hospital Admission    He presented with few days to one week history of diarrhea with 6-7 loose stools daily, along with a fever of 101.5 on the day of admission. Patient was also noted to be hypokalemic, hyponatremic. Hospitalized for further management. Stool studies were sent. C. difficile was negative. GI pathogen panel was positive for Yersinia.  XELODA was held at this time, until infection has cleared.     12/07/2016 Imaging    CT A/P W Contrast  IMPRESSION: 1. Abnormal dilatation of the proximal small bowel loops worrisome for either small bowel obstruction versus small bowel ileus. 2. 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 3. Large right inguinal hernia containing edematous loop of distal small bowel 4. No pneumatosis or bowel perforation.  No abscess. 5. Aortic atherosclerosis 6. Mild decrease in size of rectal mass. No change in right common iliac adenopathy.    02/21/2017 Surgery    XI ROBOT ABDOMINOPERINEAL RESECTION WITH COLOSTOMY by Dr. Johney Maine    02/21/2017 Pathology Results    Diagnosis 02/21/17 1. Colon, segmental resection for tumor, rectosigmoid -  INVASIVE ADENOCARCINOMA, MODERATELY DIFFERENTIATED, SPANNING 3.9 CM. - TUMOR INVADES THROUGH MUSCULARIS PROPRIA. - RESECTION MARGINS ARE NEGATIVE. - EIGHT OF EIGHT LYMPH NODES NEGATIVE FOR CARCINOMA (0/8), SEE COMMENT. - SEE  ONCOLOGY TABLE. 2. Soft tissue, biopsy, left lateral pelvic floor - BENIGN SKELETAL MUSCLE AND SOFT TISSUE. - NO MALIGNANCY IDENTIFIED. 3. Colon, segmental resection, proximal sigmoid - BENIGN COLONIC MUCOSA. - NO DYSPLASIA OR MALIGNANCY. 4. Liver, biopsy, liver mass - BENIGN VASCULAR PROLIFERATION CONSISTENT WITH HEMANGIOMA. - NO MALIGNANCY IDENTIFIED.    04/04/2017 - 08/12/2017 Chemotherapy    CAPOX every 3 weeks for 6 cycles. I decreased his first cycle of oxaliplatin platinum by 15%, he tolerated well, so we increased to full dose from cycle 2. He completed 5 cycles of oxaliplatin on 07/16/17 and completed Xeloda on 08/12/17.      08/15/2017 Procedure    Colonoscopy 08/15/17 IMPRESSION - Patent end colostomy with healthy appearing mucosa in the sigmoid colon. - The entire examined colon is normal. - No specimens collected.    09/18/2017 Imaging    CT CAP W Contrast 09/18/17 IMPRESSION: Interval abdomino-peritoneal resection with left lower quadrant colostomy. Increased diffuse cystitis, likely due to radiation. New presacral soft tissue density with central low attenuation, also suspicious for post treatment changes. Recommend continued attention on follow-up imaging. New mild retroperitoneal lymphadenopathy in the aortocaval space, measuring up to 1.8 cm. This is suspicious for metastatic disease, although it is conceivable that this could be reactive in etiology due to interval surgery and radiation therapy. Recommend short-term follow-up by CT in 3 months. New indeterminate 4 mm left upper lobe pulmonary nodule, likely inflammatory in etiology with metastatic disease considered less likely. Recommend continued attention on follow-up CT. Bilateral inguinal hernias, largest on the right containing several small bowel loops.    10/17/2017 PET scan    MPRESSION: Increased hypermetabolic lymphadenopathy in abdominal retroperitoneum, right common iliac chain, and left  supraclavicular region, consistent with metastatic disease. Stable tiny bilateral pulmonary nodules show no FDG uptake but are too small to characterize by PET. Recommend continued attention on follow-up CT. 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    10/29/2017 Relapse/Recurrence    Diagnosis Lymph node, needle/core biopsy, left supraclavicular - METASTATIC ADENOCARCINOMA, CONSISTENT WITH COLORECTAL PRIMARY. - SEE COMMENT.    11/20/2017 - 01/17/2018 Chemotherapy    FOLFIRI every 2 weeks, panitumumab was added from cycle 3.  FOLFIRI was held after cycle 3 due to disease progression, he continued with 5-FU and Panitumumab until further disease progression.      01/08/2018 Imaging    CT CAP with contrast IMPRESSION: 1. Today's study demonstrates dramatic progression of disease with interval development of widespread pulmonary metastases, multiple new liver lesions and progressive lymphadenopathy noted in the pelvis, retroperitoneum and left supraclavicular nodal stations. In addition, the previously noted metastatic lesion in the right psoas muscle has markedly increased in size. Whether or not this reflects growth of the lesion and/or internal hemorrhage of the lesion is uncertain. 2. Stable postoperative thickening and small postoperative fluid collection in the presacral space, as above, following abdominal perineal resection. 3. Large right inguinal hernia containing several loops of small bowel, without evidence of bowel incarceration or obstruction at this time.    01/14/2018 - 01/24/2018 Radiation Therapy    Radiation with Dr. Lisbeth Renshaw starting 01/14/18. Plan to complete on 01/24/18    01/31/2018 -  Chemotherapy    FOLFIRINOX plus Vectibix every 2 weeks starting 01/31/18    04/10/2018 Progression  CT CAP IMPRESSION: 1. Acute pulmonary embolism in the right lower lobe, extending proximally to the lobar branch. 2. Positive for acute PE with CT  evidence of right heart strain (RV/LV Ratio = 1.1) consistent with at least submassive (intermediate risk) PE. The presence of right heart strain has been associated with an increased risk of morbidity and mortality. 3. Overall substantial progression of metastatic disease, although there are mixed changes as detailed below. 4. Pulmonary metastases have significantly increased in size and number. 5. Liver metastases have significantly increased in size and number. 6. Left supraclavicular and retroperitoneal adenopathy is decreased. 7. Large right psoas muscle metastasis is stable. 8. Mild to moderate right hydroureteronephrosis is mildly increased, although there is no evidence of a delayed right contrast nephrogram, suggesting that the degree of ureteral obstruction is low-grade at this time. 9. Stable right inguinal hernia containing small bowel loops without bowel complication. 10. Stable postsurgical/post-treatment changes in the presacral space from APR. 11.  Aortic Atherosclerosis (ICD10-I70.0).    CURRENT THERAPY:  FOFIRINOX with dose reduction plus panitumumab every 2 weeks started on 01/31/18, has been dose reduced with cycle 2-4. Discontinued on 04/10/18 due to disease progression     INTERVAL HISTORY: David Clarke and his wife return for hospital f/u as scheduled. He completed last cycle 9 FOLFIRINOX with panitumumab on 04/04/18 with Udenyca on 8/10. He was seen by Sandi Mealy 8/14 for RLE pain and swelling and also underwent restaging CT same day. He was found to have acute PE with right heart strain as well as significant disease progression. He was admitted for hospital management and started on heparin. Dr. Burr Medico saw him inpatient and reviewed the scan with him. Doppler on 04/11/18 showed acute DVT in the femoral vein, and posterior tibial veins as well as superficial thrombosis in great saphenous veins. There was acute DVT in the left posterior tibial veins and peroneal veins. He became  anemic and thrombocytopenic related to recent chemo. He was transfused 1 unit RBC on 04/11/18. He was discharged home on 04/13/18 on xarelto.   Since discharge he remains moderately fatigued. Activity level is low. He feels the fatigue related to hospitalization and PE is more pronounced than "chemo fatigue." He has moderate dyspnea on exertion which leads to fatigue. He can leave the house for an errand but he is tired rest of the day. Appetite is present, he has gained 1 lb. Takes supplement daily. BM is slow, no nausea. He is taking xarelto BID for now, had a scratch that bled for 20 minutes but no overt hematochezia or hematuria. He has mild cough with deep inspiration. He noticed asymmetry of the right hip a few days ago he feels is fluid pocket, it is not painful and improves with mobility.   REVIEW OF SYSTEMS:   Constitutional: Denies fevers, chills or abnormal weight loss (+) fatigue (+) low activity  Ears, nose, mouth, throat, and face: Denies mucositis or sore throat (+) nosebleed from fingernail scratch on xarelto  Respiratory: Denies hemoptysis or wheezes (+) DOE (+) dry cough with deep inspiration  Cardiovascular: Denies palpitation, chest discomfort or lower extremity swelling Gastrointestinal:  Denies nausea, vomiting, diarrhea, hematochezia heartburn or change in bowel habits (+) slow GI output  Skin: (+) skin rash (+) dry flaky skin  Lymphatics: Denies new lymphadenopathy or easy bruising Neurological: (+) generalized weakness (+) neuropathy, stable  Behavioral/Psych: Mood is stable, no new changes  MSK: (+) "fluid" on right hip  All other systems were reviewed with the  patient and are negative.  MEDICAL HISTORY:  Past Medical History:  Diagnosis Date  . Allergy   . Cancer ([place]) 10/09/2016   rectal adenocarcinoma  . Inguinal hernia     SURGICAL HISTORY: Past Surgical History:  Procedure Laterality Date  . COLONOSCOPY W/ BIOPSIES Bilateral 10/09/2016   rectal  adenocarcinoma  . IR FLUORO GUIDE PORT INSERTION RIGHT  11/13/2017  . IR US GUIDE VASC ACCESS RIGHT  11/13/2017  . MOUTH SURGERY     age 32, to remove extra "eye" teeth  . [place] teeth extraction    . XI ROBOT ABDOMINAL PERINEAL RESECTION N/A 02/21/2017   Procedure: XI ROBOT ABDOMINOPERINEAL RESECTION WITH COLOSTOMY;  Surgeon: Michael Boston, MD;  Location: [facility];  Service: General;  Laterality: N/A;    I have reviewed the social history and family history with the patient and they are unchanged from previous note.  ALLERGIES:  is allergic to sulfa antibiotics.  MEDICATIONS:  Current Outpatient Medications  Medication Sig Dispense Refill  . Cannabinoids (THC FREE PO) Inhale into the lungs. THC Oil via vape as needed for sleep    . clindamycin (CLINDAGEL) 1 % gel Apply topically 2 (two) times daily. 60 g 1  . ibuprofen (ADVIL,MOTRIN) 200 MG tablet Take 200-600 mg by mouth daily as needed for moderate pain.    Marland Kitchen KLOR-CON M20 20 MEQ tablet TAKE 1 TABLET BY MOUTH TWICE A DAY 60 tablet 1  . magnesium oxide (MAG-OX) 400 (241.3 Mg) MG tablet Take 1 tablet (400 mg total) by mouth 2 (two) times daily. 60 tablet 1  . Multiple Vitamin (MULTIVITAMIN WITH MINERALS) TABS tablet Take 1 tablet by mouth every other day.     . nystatin (MYCOSTATIN) 100000 UNIT/ML suspension Take 5 mLs (500,000 Units total) by mouth 4 (four) times daily. 473 mL 0  . omeprazole (PRILOSEC) 40 MG capsule Take 1 capsule (40 mg total) by mouth daily. 30 capsule 2  . [START ON 05/04/2018] rivaroxaban (XARELTO) 20 MG TABS tablet Take 1 tablet (20 mg total) by mouth daily with supper. Please start this dose after you have completed the starter oack 30 tablet 5  . Rivaroxaban 15 & 20 MG TBPK Take as directed on package: Start with one 49m tablet by mouth twice a day with food. On Day 22 (on 05/04/2018), switch to one 266mtablet once a day with food. 51 each 0   No current facility-administered medications for this visit.     Facility-Administered Medications Ordered in Other Visits  Medication Dose Route Frequency Provider Last Rate Last Dose  . alteplase (CATHFLO ACTIVASE) injection 2 mg  2 mg Intracatheter Once PRN FeTruitt MerleMD      . heparin lock flush 100 unit/mL  250 Units Intracatheter Once PRN FeTruitt MerleMD      . heparin lock flush 100 unit/mL  500 Units Intracatheter Once PRN BuAlla FeelingNP      . sodium chloride flush (NS) 0.9 % injection 10 mL  10 mL Intracatheter PRN BuAlla FeelingNP        PHYSICAL EXAMINATION: ECOG PERFORMANCE STATUS: 3 - Symptomatic, >50% confined to bed  Vitals:   04/18/18 0948  BP: 90/66  Pulse: (!) 108  Resp: 18  Temp: 97.7 F (36.5 C)  SpO2: 100%   Filed Weights   04/18/18 0948  Weight: 124 lb 12.8 oz (56.6 kg)    GENERAL:alert, no distress and comfortable, presents in wheelchair  SKIN: dry skin with scattered acne  type skin rash to face and chest  EYES: sclera clear OROPHARYNX:no thrush or ulcers  LYMPH:  no palpable cervical, supraclavicular, or axillary lymphadenopathy LUNGS: clear to auscultation, with normal breathing effort HEART: regular rate & rhythm, no lower extremity edema ABDOMEN:abdomen soft, non-tender and normal bowel sounds Musculoskeletal: asymmetry of the right trochanter without palpable mass or joint abnormality NEURO: alert & oriented x 3 with fluent speech, generalized weakness  PAC without erythema   LABORATORY DATA:  I have reviewed the data as listed CBC Latest Ref Rng & Units 04/18/2018 04/13/2018 04/12/2018  WBC 4.0 - 10.3 K/uL 37.5(H) 5.0 4.6  Hemoglobin 13.0 - 17.1 g/dL 8.5(L) 8.5(L) 8.2(L)  Hematocrit 38.4 - 49.9 % 26.5(L) 25.5(L) 24.4(L)  Platelets 140 - 400 K/uL 289 86(L) 61(L)     CMP Latest Ref Rng & Units 04/18/2018 04/11/2018 04/10/2018  Glucose 70 - 99 mg/dL 118(H) 110(H) 111(H)  BUN 8 - 23 mg/dL 14 20 20   Creatinine 0.61 - 1.24 mg/dL 0.78 0.83 0.80  Sodium 135 - 145 mmol/L 134(L) 134(L) 131(L)  Potassium  3.5 - 5.1 mmol/L 3.7 4.0 4.2  Chloride 98 - 111 mmol/L 99 103 99  CO2 22 - 32 mmol/L 22 21(L) -  Calcium 8.9 - 10.3 mg/dL 8.8(L) 8.0(L) -  Total Protein 6.5 - 8.1 g/dL 6.1(L) 5.5(L) -  Total Bilirubin 0.3 - 1.2 mg/dL 0.7 0.7 -  Alkaline Phos 38 - 126 U/L 937(H) 599(H) -  AST 15 - 41 U/L 47(H) 30 -  ALT 0 - 44 U/L 19 29 -         CEA ([facility])  10/27/2016: 7.81 11/27/2016: 5.56 01/10/2017: 5.61 03/14/17: 5.11 04/11/17: 5.87 05/23/17: 8.79 06/19/17: 9.15 07/16/17: 9.88 08/10/17: 10.40 09/18/17: 8.31 10/30/17: 8.11 11/20/17: 7.96 12/25/2017: 13.95 01/17/18: 10.68 02/14/18: 7.17 03/14/18:10.76 04/18/18: 9.16   PATHOLOGY:  10/29/17 Diagnosis Lymph node, needle/core biopsy, left supraclavicular - METASTATIC ADENOCARCINOMA, CONSISTENT WITH COLORECTAL PRIMARY. - SEE COMMENT. Microscopic Comment The malignant cells are positive for CDX-2 and cytokeratin 20. They are negative for cytokeratin 7, p63 and cytokeratin 5/6. The findings are consistent with primary colorectal adenocarcinoma. Dr. Thressa Sheller has reviewed the case and concurs with this interpretation. Dr. Burr Medico was paged on 10/30/17. Additional studies can be performed upon clinician request. (JBK:gt, 10/31/17)   Diagnosis 02/21/17 1. Colon, segmental resection for tumor, rectosigmoid - INVASIVE ADENOCARCINOMA, MODERATELY DIFFERENTIATED, SPANNING 3.9 CM. - TUMOR INVADES THROUGH MUSCULARIS PROPRIA. - RESECTION MARGINS ARE NEGATIVE. - EIGHT OF EIGHT LYMPH NODES NEGATIVE FOR CARCINOMA (0/8), SEE COMMENT. - SEE ONCOLOGY TABLE. 2. Soft tissue, biopsy, left lateral pelvic floor - BENIGN SKELETAL MUSCLE AND SOFT TISSUE. - NO MALIGNANCY IDENTIFIED. 3. Colon, segmental resection, proximal sigmoid - BENIGN COLONIC MUCOSA. - NO DYSPLASIA OR MALIGNANCY. 4. Liver, biopsy, liver mass - BENIGN VASCULAR PROLIFERATION CONSISTENT WITH HEMANGIOMA. - NO MALIGNANCY IDENTIFIED. Microscopic Comment 1. COLON AND RECTUM (INCLUDING  TRANS-ANAL RESECTION): Specimen: Rectosigmoid colon with anus. Procedure: Abdominoperineal resection. Tumor site: Distal 1/3 rectum to anus. Specimen integrity: Intact. Macroscopic intactness of mesorectum: Incomplete. Macroscopic tumor perforation: Not identified. Invasive tumor: Maximum size: 3.9 cm Histologic type(s): Invasive adenocarcinoma. Histologic grade and differentiation: G2: moderately differentiated/low grade Type of polyp in which invasive carcinoma arose: N/A. Microscopic extension of invasive tumor: Tumor invades through muscularis propria. Lymph-Vascular invasion: Not identified. 1 of 3 FINAL for David Clarke, David Clarke [phone]) Microscopic Comment(continued) Peri-neural invasion: Present. Tumor deposit(s) (discontinuous extramural extension): Not identified. Resection margins: Proximal margin: Negative. Distal margin: Negative. Circumferential (radial) (posterior ascending, posterior  descending; lateral and posterior mid-rectum; and entire lower 1/3 rectum): Negative. Mesenteric margin (sigmoid and transverse): Negative. Distance closest margin (if all above margins negative): <0.1 cm radial margin. >1 cm distal margin. Treatment effect (neo-adjuvant therapy): Present Additional polyp(s): N/A. Non-neoplastic findings: None. Lymph nodes: number examined 8; number positive: 0 (treatment effect present). Pathologic Staging: ypT3, ypN0, ypMX Ancillary studies: Can be performed upon request. Comment: The fat was cleared and additional lymph nodes were not identified.  Diagnosis 10/09/2016 Rectum, biopsy, mass - ADENOCARCINOMA.    RADIOGRAPHIC STUDIES: I have personally reviewed the radiological images as listed and agreed with the findings in the report. No results found.   ASSESSMENT & PLAN: David Clarke y.o.gentleman, previously healthy, presented with an anal mass and a mild intermittent rectal bleeding for 5-6 months. Fatigue, anorexia, fatigue and weight  loss for a month. He is currently on radiation for his rectal cancer, chemotherapy has been held lately.   1. Rectal Adenocarcinoma, low rectum, [id number], ypT3N0, stage IIIB, MSI-stable, nodes metastasis 09/2017, KRAS/NRAS/BRAF wild type  2. Acute PE with right heart strain, at least submassive PE 3. 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 Peripheral neuropathy, secondary to chemo, G1  5.Goal of care discussion  6. Right sacral and anterolateral upper leg pain , now resolved, with associated numbness to mid-thigh  7. Constipation 8. Rash on face, neck, secondary to panitumumab  9. Weight loss, low appetite  10. Hypokalemia, hypomagnesemia, secondary to panitumumab  11. Skin/nail toxicity, secondary to chemotherapy likely 5FU 12. recurrent oral thrush, on prophylactic nystatin BID   David Clarke appears stable. He has not fully recovered from hospitalization and PE. His performance status is low. I encouraged him to eat and drink well, and to be active with ADLs. He will continue nutrition supplement. He is on xarelto for PE, tolerating well.   Dr. Burr Medico previously reviewed restaging CT which shows significant disease progression. FOLFIRINOX and panitumumab were discontinued. We discussed possible local or regional clinical trial in the future when his performance status improves. In the meantime, the worry is that his cancer will progress rapidly, and he may not be a candidate for clinical trial in the future. Dr. Burr Medico discussed starting lonsurf while he awaits clinical trial, she reviewed the low response rate and possible side effects. He will get stronger and hopefully start after next f/u in 1-2 weeks. Printed material on lonsurf was provided today.  He will recover in the meantime and eat well, try to increase strength and nutrition. He is open to palliative care services at home in the future, he  and his wife manage well at this time. He is optimistic and positive about his status, but realistic about his condition and knows he has an aggressive cancer; he wants to continue anti-cancer therapy for now.   Labs reviewed. WBC is markedly elevated, possibly related to recent neulasta vs underlying malignancy. Ferritin is elevated, likely reactive. Mg low at 1.5, he will increase oral supplement to 1 tab BID x3 days then resume once daily. Platelets recovered. Hgb 8.5. Will monitor closely. Plan to return in 1-2 weeks   PLAN: -500 cc NS today at 3 PM -Return for lab, f/u with Dr. Burr Medico in 1.5-2 weeks  -Plan to begin [place] while explore regional clinical trials -Mg 1 tab BID x3 days then resume once daily  All questions were answered. The patient knows to call the clinic with any problems,  questions or concerns. No barriers to learning was detected. I spent 25 minutes counseling the patient face to face. The total time spent in the appointment was 30 minutes and more than 50% was on counseling and review of test results     Alla Feeling, NP 04/18/18   Addendum  I have seen the patient, examined him. I agree with the assessment and and plan and have edited the notes.   Herminio is recovering slowly from his recent hospitalization for PE and DVT.  He came in a wheelchair today, still has shortness of breath on exertion, quite fatigued, no fever or productive cough.  Is tolerating Xarelto well, will continue.  Unfortunately his recent scan showed significant disease progression in his liver and the lungs, his overall performance status has deteriorated also.  We discussed further treatment options, including Lonsurf, regorafenib, and clinical trials.  Due to his performance status, he is probably not a candidate for clinical trial now.  We discussed the benefit and side effect of Lonsurf, would like to try.  His last chemotherapy was 2 weeks ago, I would like him to recover better before start  him on next line therapy, will get the prescription ready for him.  We also discussed palliative care, he is agreeable to have palliative care service at home, will refer.  He is not ready for hospice.  CODE STATUS was previous discussed with patient in the hospital, he wishes to remain to be full code for now.  Discussed symptom management today.  We will see him back in 1-2 weeks for f/u and starting on Lonsurf.   Truitt Merle  04/18/2018

## 2018-04-23 ENCOUNTER — Inpatient Hospital Stay: Payer: BLUE CROSS/BLUE SHIELD

## 2018-04-23 ENCOUNTER — Inpatient Hospital Stay (HOSPITAL_BASED_OUTPATIENT_CLINIC_OR_DEPARTMENT_OTHER): Payer: BLUE CROSS/BLUE SHIELD | Admitting: Medical

## 2018-04-23 ENCOUNTER — Telehealth: Payer: Self-pay | Admitting: *Deleted

## 2018-04-23 VITALS — BP 99/80 | HR 121 | Temp 98.1°F | Resp 22 | Ht 72.0 in

## 2018-04-23 DIAGNOSIS — D649 Anemia, unspecified: Secondary | ICD-10-CM | POA: Diagnosis not present

## 2018-04-23 DIAGNOSIS — R0609 Other forms of dyspnea: Principal | ICD-10-CM

## 2018-04-23 DIAGNOSIS — R000 Tachycardia, unspecified: Secondary | ICD-10-CM | POA: Diagnosis not present

## 2018-04-23 DIAGNOSIS — C7800 Secondary malignant neoplasm of unspecified lung: Secondary | ICD-10-CM

## 2018-04-23 DIAGNOSIS — I2699 Other pulmonary embolism without acute cor pulmonale: Secondary | ICD-10-CM | POA: Diagnosis not present

## 2018-04-23 DIAGNOSIS — R5381 Other malaise: Secondary | ICD-10-CM

## 2018-04-23 DIAGNOSIS — C787 Secondary malignant neoplasm of liver and intrahepatic bile duct: Secondary | ICD-10-CM

## 2018-04-23 DIAGNOSIS — Z7901 Long term (current) use of anticoagulants: Secondary | ICD-10-CM

## 2018-04-23 DIAGNOSIS — I519 Heart disease, unspecified: Secondary | ICD-10-CM

## 2018-04-23 DIAGNOSIS — Z933 Colostomy status: Secondary | ICD-10-CM

## 2018-04-23 DIAGNOSIS — C20 Malignant neoplasm of rectum: Secondary | ICD-10-CM

## 2018-04-23 DIAGNOSIS — R0600 Dyspnea, unspecified: Secondary | ICD-10-CM

## 2018-04-23 DIAGNOSIS — Z87891 Personal history of nicotine dependence: Secondary | ICD-10-CM

## 2018-04-23 DIAGNOSIS — R1909 Other intra-abdominal and pelvic swelling, mass and lump: Secondary | ICD-10-CM

## 2018-04-23 LAB — CMP (CANCER CENTER ONLY)
ALT: 23 U/L (ref 0–44)
AST: 70 U/L — ABNORMAL HIGH (ref 15–41)
Albumin: 2.3 g/dL — ABNORMAL LOW (ref 3.5–5.0)
Alkaline Phosphatase: 851 U/L — ABNORMAL HIGH (ref 38–126)
Anion gap: 14 (ref 5–15)
BUN: 20 mg/dL (ref 8–23)
CHLORIDE: 99 mmol/L (ref 98–111)
CO2: 19 mmol/L — AB (ref 22–32)
CREATININE: 0.83 mg/dL (ref 0.61–1.24)
Calcium: 8.5 mg/dL — ABNORMAL LOW (ref 8.9–10.3)
GFR, Est AFR Am: >60 mL/min (ref >60)
GFR, Estimated: >60 mL/min (ref >60)
Glucose, Bld: 99 mg/dL (ref 70–99)
Potassium: 4 mmol/L (ref 3.5–5.1)
SODIUM: 132 mmol/L — AB (ref 135–145)
Total Bilirubin: 1.1 mg/dL (ref 0.3–1.2)
Total Protein: 5.5 g/dL — ABNORMAL LOW (ref 6.5–8.1)

## 2018-04-23 LAB — CBC WITH DIFFERENTIAL (CANCER CENTER ONLY)
BASOS ABS: 0 10*3/uL (ref 0.0–0.1)
Band Neutrophils: 10 %
Basophils Relative: 0 %
Blasts: 0 %
EOS PCT: 0 %
Eosinophils Absolute: 0 10*3/uL (ref 0.0–0.5)
HCT: 20.8 % — ABNORMAL LOW (ref 38.4–49.9)
Hemoglobin: 6.6 g/dL — CL (ref 13.0–17.1)
LYMPHS ABS: 1.3 10*3/uL (ref 0.9–3.3)
Lymphocytes Relative: 3 %
MCH: 31 pg (ref 27.2–33.4)
MCHC: 31.7 g/dL — ABNORMAL LOW (ref 32.0–36.0)
MCV: 97.7 fL (ref 79.3–98.0)
METAMYELOCYTES PCT: 2 %
MONO ABS: 0 10*3/uL — AB (ref 0.1–0.9)
MYELOCYTES: 2 %
Monocytes Relative: 0 %
Neutro Abs: 40.8 10*3/uL — ABNORMAL HIGH (ref 1.5–6.5)
Neutrophils Relative %: 83 %
Other: 0 %
PLATELETS: 321 10*3/uL (ref 140–400)
PROMYELOCYTES RELATIVE: 0 %
RBC: 2.13 MIL/uL — AB (ref 4.20–5.82)
RDW: 21.4 % — ABNORMAL HIGH (ref 11.0–14.6)
WBC: 42.1 10*3/uL — AB (ref 4.0–10.3)
nRBC: 1 /100 WBC — ABNORMAL HIGH

## 2018-04-23 LAB — ABO/RH: ABO/RH(D): O POS

## 2018-04-23 MED ORDER — SODIUM CHLORIDE 0.9 % IV SOLN
Freq: Once | INTRAVENOUS | Status: AC
  Administered 2018-04-23: 15:00:00 via INTRAVENOUS
  Filled 2018-04-23: qty 250

## 2018-04-23 MED ORDER — HEPARIN SOD (PORK) LOCK FLUSH 100 UNIT/ML IV SOLN
500.0000 [IU] | Freq: Once | INTRAVENOUS | Status: AC
  Administered 2018-04-23: 500 [IU] via INTRAVENOUS
  Filled 2018-04-23: qty 5

## 2018-04-23 MED ORDER — SODIUM CHLORIDE 0.9% FLUSH
10.0000 mL | INTRAVENOUS | Status: DC | PRN
  Administered 2018-04-23: 10 mL via INTRAVENOUS
  Filled 2018-04-23: qty 10

## 2018-04-23 NOTE — Patient Instructions (Signed)
Dehydration, Adult Dehydration is when there is not enough fluid or water in your body. This happens when you lose more fluids than you take in. Dehydration can range from mild to very bad. It should be treated right away to keep it from getting very bad. Symptoms of mild dehydration may include:  Thirst.  Dry lips.  Slightly dry mouth.  Dry, warm skin.  Dizziness. Symptoms of moderate dehydration may include:  Very dry mouth.  Muscle cramps.  Dark pee (urine). Pee may be the color of tea.  Your body making less pee.  Your eyes making fewer tears.  Heartbeat that is uneven or faster than normal (palpitations).  Headache.  Light-headedness, especially when you stand up from sitting.  Fainting (syncope). Symptoms of very bad dehydration may include:  Changes in skin, such as: ? Cold and clammy skin. ? Blotchy (mottled) or pale skin. ? Skin that does not quickly return to normal after being lightly pinched and let go (poor skin turgor).  Changes in body fluids, such as: ? Feeling very thirsty. ? Your eyes making fewer tears. ? Not sweating when body temperature is high, such as in hot weather. ? Your body making very little pee.  Changes in vital signs, such as: ? Weak pulse. ? Pulse that is more than 100 beats a minute when you are sitting still. ? Fast breathing. ? Low blood pressure.  Other changes, such as: ? Sunken eyes. ? Cold hands and feet. ? Confusion. ? Lack of energy (lethargy). ? Trouble waking up from sleep. ? Short-term weight loss. ? Unconsciousness. Follow these instructions at home:  If told by your doctor, drink an ORS: ? Make an ORS by using instructions on the package. ? Start by drinking small amounts, about  cup (120 mL) every 5-10 minutes. ? Slowly drink more until you have had the amount that your doctor said to have.  Drink enough clear fluid to keep your pee clear or pale yellow. If you were told to drink an ORS, finish the ORS  first, then start slowly drinking clear fluids. Drink fluids such as: ? Water. Do not drink only water by itself. Doing that can make the salt (sodium) level in your body get too low (hyponatremia). ? Ice chips. ? Fruit juice that you have added water to (diluted). ? Low-calorie sports drinks.  Avoid: ? Alcohol. ? Drinks that have a lot of sugar. These include high-calorie sports drinks, fruit juice that does not have water added, and soda. ? Caffeine. ? Foods that are greasy or have a lot of fat or sugar.  Take over-the-counter and prescription medicines only as told by your doctor.  Do not take salt tablets. Doing that can make the salt level in your body get too high (hypernatremia).  Eat foods that have minerals (electrolytes). Examples include bananas, oranges, potatoes, tomatoes, and spinach.  Keep all follow-up visits as told by your doctor. This is important. Contact a doctor if:  You have belly (abdominal) pain that: ? Gets worse. ? Stays in one area (localizes).  You have a rash.  You have a stiff neck.  You get angry or annoyed more easily than normal (irritability).  You are more sleepy than normal.  You have a harder time waking up than normal.  You feel: ? Weak. ? Dizzy. ? Very thirsty.  You have peed (urinated) only a small amount of very dark pee during 6-8 hours. Get help right away if:  You have symptoms of   very bad dehydration.  You cannot drink fluids without throwing up (vomiting).  Your symptoms get worse with treatment.  You have a fever.  You have a very bad headache.  You are throwing up or having watery poop (diarrhea) and it: ? Gets worse. ? Does not go away.  You have blood or something green (bile) in your throw-up.  You have blood in your poop (stool). This may cause poop to look black and tarry.  You have not peed in 6-8 hours.  You pass out (faint).  Your heart rate when you are sitting still is more than 100 beats a  minute.  You have trouble breathing. This information is not intended to replace advice given to you by your health care provider. Make sure you discuss any questions you have with your health care provider. Document Released: 06/10/2009 Document Revised: 03/03/2016 Document Reviewed: 10/08/2015 Elsevier Interactive Patient Education  2018 Elsevier Inc.  

## 2018-04-23 NOTE — Progress Notes (Signed)
[facility] Progress Note   David Clarke [id number] [birth year] 62 y.o.  David Clarke is managed by Dr. Truitt Merle  Actively treated with chemotherapy/immunotherapy: no (most recently treated with FOLFIRINOX plus vectibix)  Assessment: Plan:    Dyspnea on exertion - Plan: CBC with Differential ([place] Only), CMP ([place] only), Type and screen, Prepare RBC, heparin lock flush 100 unit/mL, sodium chloride flush (NS) 0.9 % injection 10 mL, Type and screen, Prepare RBC, Ambulatory referral to Pulmonology, Ambulatory referral to [place], DISCONTINUED: heparin lock flush 100 unit/mL, DISCONTINUED: acetaminophen (TYLENOL) tablet 650 mg, DISCONTINUED: diphenhydrAMINE (BENADRYL) capsule 25 mg, CANCELED: Practitioner attestation of consent, CANCELED: Complete patient signature process for consent form, CANCELED: Care order/instruction, CANCELED: Transfuse RBC  Tachycardia - Plan: CBC with Differential ([place] Only), CMP ([place] only), 0.9 %  sodium chloride infusion, Type and screen, Prepare RBC, heparin lock flush 100 unit/mL, sodium chloride flush (NS) 0.9 % injection 10 mL, Type and screen, Prepare RBC, Ambulatory referral to [place], DISCONTINUED: heparin lock flush 100 unit/mL, DISCONTINUED: acetaminophen (TYLENOL) tablet 650 mg, DISCONTINUED: diphenhydrAMINE (BENADRYL) capsule 25 mg, CANCELED: Practitioner attestation of consent, CANCELED: Complete patient signature process for consent form, CANCELED: Care order/instruction, CANCELED: Transfuse RBC  Anemia, unspecified type  Rectal adenocarcinoma (HCC)  Physical deconditioning - Plan: Ambulatory referral to [place]  Acute pulmonary embolism without acute cor pulmonale, unspecified pulmonary embolism type (HCC)   Tachycardia, dyspnea on exertion, and anemia: A CBC was completed today with a hemoglobin returning at 6.6.  David Clarke will return tomorrow for a transfusion of 2 units of packed red  blood cells.  He has requested a referral to pulmonology for his ongoing shortness of breath.  He is scheduled to return to clinic on 04/30/2018 but has been instructed to call for an appointment earlier should he feel as though he is continuing to be dyspneic after his transfusion.  Deconditioning: A referral has been placed to home physical therapy.  Rectal adenocarcinoma: Dr. Truitt Merle is investigating possible clinical trials for the patient.  Pulmonary emboli: The patient continues on Xarelto 15 mg p.o. twice daily.  Please see After Visit Summary for patient specific instructions.  Future Appointments  Date Time Provider [place]  04/26/2018 10:00 AM [facility] 4 [facility] None  04/26/2018 10:30 AM Tanner, Lucianne Lei E., PA-C [facility] None  04/30/2018  3:00 PM [facility] 2 [facility] None  04/30/2018  3:30 PM Truitt Merle, MD [facility] None  05/02/2018  8:30 AM [facility] LAB 2 [facility] None  05/02/2018  8:30 AM [facility] [place] FLUSH [facility] None  05/02/2018  8:45 AM Truitt Merle, MD [facility] None  05/02/2018  2:30 PM Karie Mainland, RD [facility] None  05/16/2018  8:45 AM [facility] LAB 1 [facility] None  05/16/2018  9:00 AM [facility] [place] [facility] None  05/16/2018  9:30 AM Alla Feeling, NP [facility] None    Orders Placed This Encounter  Procedures  . CBC with Differential ([place] Only)  . CMP ([place] only)  . Ambulatory referral to Pulmonology  . Ambulatory referral to Home Health  . Type and screen  . Prepare RBC  . ABO/Rh       Subjective:   Patient ID:  David Clarke is a 62 y.o. (DOB [birth year]) male.  Chief Complaint: No chief complaint on file.   HPI David Clarke is a 62 year old male with a history of a metastatic colorectal adenocarcinoma who is managed by Dr.  Burr Medico and was last seen on 04/18/2018. Prior to that, he had been seen on 04/10/2018 after having a restaging CT scan completed. His CT scan showed a right lung PE. The  scan also showed significant disease progression in lungs and liver, but with improved adenopathy. Dr. Burr Medico sent a message to Dr. Aleatha Borer at [facility] to see if they have clinical trial option for him.  She indicated that she would also look for other clinical trial options in other institutions in [place]. David Clarke reported mild dyspnea on moderate exertion and had developed mild right thigh edema since that morning, and right lower extremity tightness over the past 2 days. She was concerned that this could indicate a possible DVT.  We did 5 minutes walking, his oxygen level dropped to 83% on room air.  His CT scan also showed right heart strain.  David Clarke was admitted to [facility] through the ER and was started on Xarelto 15 mg twice a day for 21 days and then 20 mg daily starting 9/7.  He denies any bright red blood or melena in his ostomy.  He denies any evidence of active bleeding.  [name] called today and requested an appointment so that he could be evaluated for ongoing swelling in his right groin. He believes that he has an enlarged lymph node there. It is tender to the touch.  He is having significant dyspnea on exertion.  He reports that he is significantly short of breath even with getting up to go to the bathroom. He is also requesting a Pulmonologist referral.    Medications: I have reviewed the patient's current medications.  Allergies:  Allergies  Allergen Reactions  . Sulfa Antibiotics Nausea Only and Other (See Comments)    Reaction may years ago, pt thinks nausea only.     Past Medical History:  Diagnosis Date  . Allergy   . Cancer ([place]) 10/09/2016   rectal adenocarcinoma  . Inguinal hernia     Past Surgical History:  Procedure Laterality Date  . COLONOSCOPY W/ BIOPSIES Bilateral 10/09/2016   rectal adenocarcinoma  . IR FLUORO GUIDE PORT INSERTION RIGHT  11/13/2017  . IR US GUIDE VASC ACCESS RIGHT  11/13/2017  . MOUTH SURGERY     age 66, to remove extra "eye"  teeth  . [place] teeth extraction    . XI ROBOT ABDOMINAL PERINEAL RESECTION N/A 02/21/2017   Procedure: XI ROBOT ABDOMINOPERINEAL RESECTION WITH COLOSTOMY;  Surgeon: Michael Boston, MD;  Location: [facility];  Service: General;  Laterality: N/A;    Family History  Problem Relation Age of Onset  . Other Mother        bile duct tumor  . Stroke Mother   . Cancer Mother 58       bile duct cancer   . Prostate cancer Father 43  . Emphysema Father   . Colon cancer Neg Hx   . Esophageal cancer Neg Hx   . Pancreatic cancer Neg Hx   . Rectal cancer Neg Hx   . Stomach cancer Neg Hx     Social History   Socioeconomic History  . Marital status: Soil scientist    Spouse name: Not on file  . Number of children: 1  . Years of education: Not on file  . Highest education level: Not on file  Occupational History  . Occupation: Architectural technologist  Social Needs  . Financial resource strain: Not on file  . Food insecurity:    Worry: Not on  file    Inability: Not on file  . Transportation needs:    Medical: Not on file    Non-medical: Not on file  Tobacco Use  . Smoking status: Former Smoker    Years: 2.00    Types: Cigarettes    Last attempt to quit: 2002    Years since quitting: 17.6  . Smokeless tobacco: Never Used  Substance and Sexual Activity  . Alcohol use: Not Currently    Alcohol/week: 10.0 standard drinks    Types: 4 Glasses of wine, 6 Cans of beer per week    Comment: beer or a glass or wine most days of the week  . Drug use: No  . Sexual activity: Yes    Partners: Female  Lifestyle  . Physical activity:    Days per week: Not on file    Minutes per session: Not on file  . Stress: Not on file  Relationships  . Social connections:    Talks on phone: Not on file    Gets together: Not on file    Attends religious service: Not on file    Active member of club or organization: Not on file    Attends meetings of clubs or organizations: Not on file    Relationship status:  Not on file  . Intimate partner violence:    Fear of current or ex partner: Not on file    Emotionally abused: Not on file    Physically abused: Not on file    Forced sexual activity: Not on file  Other Topics Concern  . Not on file  Social History Narrative  . Not on file    Past Medical History, Surgical history, Social history, and Family history were reviewed and updated as appropriate.   Please see review of systems for further details on the patient's review from today.   Review of Systems:  Review of Systems  Constitutional: Positive for fatigue. Negative for appetite change, chills, diaphoresis and fever.  HENT: Negative for dental problem, mouth sores and trouble swallowing.   Respiratory: Positive for shortness of breath. Negative for cough and chest tightness.   Cardiovascular: Negative for chest pain and palpitations.  Gastrointestinal: Positive for abdominal distention. Negative for blood in stool, constipation, diarrhea, nausea and vomiting.  Neurological: Negative for dizziness, syncope, weakness and headaches.    Objective:   Physical Exam:  BP 99/80 (BP Location: Left Arm, Patient Position: Sitting)   Pulse (!) 121   Temp 98.1 F (36.7 C) (Oral)   Resp (!) 22   Ht 6' (1.829 m)   SpO2 96%   BMI 16.93 kg/m  ECOG: 1  Supine:  Oxygen saturation:  97% on room air  Pulse: 110 Sitting: Oxygen saturation:  92-100 % on room air Pulse: 135 Walking: Oxygen saturation: 100% on room air  Pulse: 155 (pulse return to 106 with rest with an 1 to 2 minutes)  Physical Exam  Constitutional: No distress.  Mr. Siordia is an adult male who appears to be chronically ill.  He is significantly short of breath with ambulation but quickly recovers with rest.  HENT:  Head: Normocephalic and atraumatic.  Right Ear: External ear normal.  Left Ear: External ear normal.  Mouth/Throat: Oropharynx is clear and moist. No oropharyngeal exudate.  Neck: Normal range of motion. Neck  supple.  Cardiovascular: Regular rhythm and normal heart sounds. Tachycardia present. Exam reveals no friction rub.  No murmur heard. Pulmonary/Chest: Breath sounds normal. No respiratory distress. He has no  wheezes. He has no rales.  Shortness of breath with ambulation.  The patient's shortness of breath resolves with rest.  Abdominal: Soft. Bowel sounds are normal. He exhibits distension. He exhibits no mass. There is no tenderness. There is no rebound and no guarding.  An ostomy is noted in the left upper quadrant with soft brown stool noted.  Musculoskeletal: He exhibits edema (1+ pitting edema of the right lateral thigh.).  Lymphadenopathy:    He has no cervical adenopathy.  Neurological: He is alert. Coordination normal.  Skin: Skin is warm and dry. No rash noted. He is not diaphoretic. No erythema.  Psychiatric: He has a normal mood and affect. His behavior is normal. Judgment and thought content normal.    Lab Review:     Component Value Date/Time   NA 132 (L) 04/23/2018 1435   NA 137 08/10/2017 0829   K 4.0 04/23/2018 1435   K 3.5 08/10/2017 0829   CL 99 04/23/2018 1435   CO2 19 (L) 04/23/2018 1435   CO2 21 (L) 08/10/2017 0829   GLUCOSE 99 04/23/2018 1435   GLUCOSE 133 08/10/2017 0829   BUN 20 04/23/2018 1435   BUN 16.5 08/10/2017 0829   CREATININE 0.83 04/23/2018 1435   CREATININE 0.8 08/10/2017 0829   CALCIUM 8.5 (L) 04/23/2018 1435   CALCIUM 9.0 08/10/2017 0829   PROT 5.5 (L) 04/23/2018 1435   PROT 7.1 08/10/2017 0829   ALBUMIN 2.3 (L) 04/23/2018 1435   ALBUMIN 3.5 08/10/2017 0829   AST 70 (H) 04/23/2018 1435   AST 47 (H) 08/10/2017 0829   ALT 23 04/23/2018 1435   ALT 38 08/10/2017 0829   ALKPHOS 851 (H) 04/23/2018 1435   ALKPHOS 137 08/10/2017 0829   BILITOT 1.1 04/23/2018 1435   BILITOT 1.32 (H) 08/10/2017 0829   GFRNONAA >60 04/23/2018 1435   GFRAA >60 04/23/2018 1435       Component Value Date/Time   WBC 42.1 (H) 04/23/2018 1435   WBC 37.5 (H)  04/18/2018 0915   RBC 2.13 (L) 04/23/2018 1435   HGB 6.6 (LL) 04/23/2018 1435   HGB 12.3 (L) 08/10/2017 0829   HCT 20.8 (L) 04/23/2018 1435   HCT 37.1 (L) 08/10/2017 0829   PLT 321 04/23/2018 1435   PLT 123 (L) 08/10/2017 0829   MCV 97.7 04/23/2018 1435   MCV 106.0 (H) 08/10/2017 0829   MCH 31.0 04/23/2018 1435   MCHC 31.7 (L) 04/23/2018 1435   RDW 21.4 (H) 04/23/2018 1435   RDW 19.4 (H) 08/10/2017 0829   LYMPHSABS 1.3 04/23/2018 1435   LYMPHSABS 0.6 (L) 08/10/2017 0829   MONOABS 0.0 (L) 04/23/2018 1435   MONOABS 0.6 08/10/2017 0829   EOSABS 0.0 04/23/2018 1435   EOSABS 0.0 08/10/2017 0829   BASOSABS 0.0 04/23/2018 1435   BASOSABS 0.0 08/10/2017 0829   -------------------------------  Imaging from last 24 hours (if applicable):  Radiology interpretation: Ct Chest W Contrast  Result Date: 04/10/2018 CLINICAL DATA:  Stage IV rectal adenocarcinoma, presenting for restaging with ongoing chemotherapy. EXAM: CT CHEST, ABDOMEN, AND PELVIS WITH CONTRAST TECHNIQUE: Multidetector CT imaging of the chest, abdomen and pelvis was performed following the standard protocol during bolus administration of intravenous contrast. CONTRAST:  127mL OMNIPAQUE IOHEXOL 300 MG/ML  SOLN COMPARISON:  01/08/2018 CT chest, abdomen and pelvis. FINDINGS: CT CHEST FINDINGS Cardiovascular: Normal heart size. No significant pericardial effusion/thickening. Right internal jugular MediPort terminates at the cavoatrial junction. Great vessels are normal in course and caliber. There is an acute pulmonary embolus to  the right lower lobe involving lobar, segmental and subsegmental branches. Elevated RV/LV ratio 1.08. Mediastinum/Nodes: No discrete thyroid nodules. Unremarkable esophagus. Mildly enlarged 1.3 cm left supraclavicular node (series 2/image 6) is decreased from 2.5 cm. No axillary adenopathy. No mediastinal or hilar adenopathy. Lungs/Pleura: No pneumothorax. No pleural effusion. Numerous solid pulmonary masses and  nodules throughout both lungs (greater than 30), significantly increased in size and number. For example, a 3.3 cm right middle lobe mass (series 9/image 129), increased from 1.8 cm. Basilar left lower lobe 2.3 cm nodule (series 9/image 136) is increased from 0.8 cm. Left upper lobe 1.9 cm nodule (series 9/image 49) is increased from 0.8 cm. Musculoskeletal:  No discrete focal osseous lesions in the chest. CT ABDOMEN PELVIS FINDINGS Hepatobiliary: Liver is enlarged by numerous (greater than 10) bulky heterogeneous hypoenhancing masses, significantly increased in size and number. For example, an 8.0 x 7.5 cm lateral segment left liver lobe mass (series 2/image 71), increased from 2.8 x 1.8 cm. Inferior right liver lobe 7.8 x 4.7 cm mass (series 2/image 74), increased from 2.1 x 2.1 cm. Right liver dome 9.7 x 8.3 cm mass (series 2/image 56), increased from 1.1 x 1.0 cm. Normal gallbladder with no radiopaque cholelithiasis. No biliary ductal dilatation. Pancreas: Normal, with no mass or duct dilation. Spleen: Normal size. No mass. Adrenals/Urinary Tract: No discrete adrenal nodules. Mild-to-moderate right hydroureteronephrosis to the level of mid right lumbar ureter is mildly increased. No left hydronephrosis. Contrast nephrograms are symmetric and within normal limits. Subcentimeter hypodense renal cortical lesions in the upper right kidney are too small to characterize and are stable. No new renal lesions. Bladder is collapsed with stable chronic mild diffuse bladder wall thickening and perivesical fat haziness. Stomach/Bowel: Normal non-distended stomach. Moderate right inguinal hernia containing multiple right pelvic small bowel loops, unchanged. No small bowel dilatation or wall thickening. Oral contrast transits to the colon. Appendix appears normal. Stable postsurgical changes from end sigmoid colostomy in the ventral left abdominal wall with no wall thickening or acute pericolonic fat stranding in the remnant  large-bowel. Stable postsurgical and post treatment changes from abdominoperineal resection, with mildly heterogeneous soft tissue density in the presacral space measuring up to 3.4 cm thickness (series 2/image 113), stable. Vascular/Lymphatic: Atherosclerotic nonaneurysmal abdominal aorta. Patent portal, hepatic, splenic and renal veins. Aortocaval adenopathy measures up to 3.5 cm (series 2/image 77), decreased from 4.3 cm. No new adenopathy in the abdomen or pelvis. Reproductive: Stable top-normal size prostate. Other: No pneumoperitoneum. No ascites. No focal fluid collections. Heterogeneous hypodense right psoas muscle 11.2 x 10.6 cm mass (series 2/image 80), previously 11.8 x 11.1 cm, not appreciably changed. Musculoskeletal: No discrete osseous lesions. Mild lumbar spondylosis. IMPRESSION: 1. Acute pulmonary embolism in the right lower lobe, extending proximally to the lobar branch. 2. Positive for acute PE with CT evidence of right heart strain (RV/LV Ratio = 1.1) consistent with at least submassive (intermediate risk) PE. The presence of right heart strain has been associated with an increased risk of morbidity and mortality. 3. Overall substantial progression of metastatic disease, although there are mixed changes as detailed below. 4. Pulmonary metastases have significantly increased in size and number. 5. Liver metastases have significantly increased in size and number. 6. Left supraclavicular and retroperitoneal adenopathy is decreased. 7. Large right psoas muscle metastasis is stable. 8. Mild to moderate right hydroureteronephrosis is mildly increased, although there is no evidence of a delayed right contrast nephrogram, suggesting that the degree of ureteral obstruction is low-grade at this time.  9. Stable right inguinal hernia containing small bowel loops without bowel complication. 10. Stable postsurgical/post-treatment changes in the presacral space from APR. 11.  Aortic Atherosclerosis  (ICD10-I70.0). Critical Value/emergent results were called by telephone at the time of interpretation on 04/10/2018 at 2:43 pm to Dr. Truitt Merle , who verbally acknowledged these results. Electronically Signed   By: Ilona Sorrel M.D.   On: 04/10/2018 14:47   Ct Abdomen Pelvis W Contrast  Result Date: 04/10/2018 CLINICAL DATA:  Stage IV rectal adenocarcinoma, presenting for restaging with ongoing chemotherapy. EXAM: CT CHEST, ABDOMEN, AND PELVIS WITH CONTRAST TECHNIQUE: Multidetector CT imaging of the chest, abdomen and pelvis was performed following the standard protocol during bolus administration of intravenous contrast. CONTRAST:  152mL OMNIPAQUE IOHEXOL 300 MG/ML  SOLN COMPARISON:  01/08/2018 CT chest, abdomen and pelvis. FINDINGS: CT CHEST FINDINGS Cardiovascular: Normal heart size. No significant pericardial effusion/thickening. Right internal jugular MediPort terminates at the cavoatrial junction. Great vessels are normal in course and caliber. There is an acute pulmonary embolus to the right lower lobe involving lobar, segmental and subsegmental branches. Elevated RV/LV ratio 1.08. Mediastinum/Nodes: No discrete thyroid nodules. Unremarkable esophagus. Mildly enlarged 1.3 cm left supraclavicular node (series 2/image 6) is decreased from 2.5 cm. No axillary adenopathy. No mediastinal or hilar adenopathy. Lungs/Pleura: No pneumothorax. No pleural effusion. Numerous solid pulmonary masses and nodules throughout both lungs (greater than 30), significantly increased in size and number. For example, a 3.3 cm right middle lobe mass (series 9/image 129), increased from 1.8 cm. Basilar left lower lobe 2.3 cm nodule (series 9/image 136) is increased from 0.8 cm. Left upper lobe 1.9 cm nodule (series 9/image 49) is increased from 0.8 cm. Musculoskeletal:  No discrete focal osseous lesions in the chest. CT ABDOMEN PELVIS FINDINGS Hepatobiliary: Liver is enlarged by numerous (greater than 10) bulky heterogeneous  hypoenhancing masses, significantly increased in size and number. For example, an 8.0 x 7.5 cm lateral segment left liver lobe mass (series 2/image 71), increased from 2.8 x 1.8 cm. Inferior right liver lobe 7.8 x 4.7 cm mass (series 2/image 74), increased from 2.1 x 2.1 cm. Right liver dome 9.7 x 8.3 cm mass (series 2/image 56), increased from 1.1 x 1.0 cm. Normal gallbladder with no radiopaque cholelithiasis. No biliary ductal dilatation. Pancreas: Normal, with no mass or duct dilation. Spleen: Normal size. No mass. Adrenals/Urinary Tract: No discrete adrenal nodules. Mild-to-moderate right hydroureteronephrosis to the level of mid right lumbar ureter is mildly increased. No left hydronephrosis. Contrast nephrograms are symmetric and within normal limits. Subcentimeter hypodense renal cortical lesions in the upper right kidney are too small to characterize and are stable. No new renal lesions. Bladder is collapsed with stable chronic mild diffuse bladder wall thickening and perivesical fat haziness. Stomach/Bowel: Normal non-distended stomach. Moderate right inguinal hernia containing multiple right pelvic small bowel loops, unchanged. No small bowel dilatation or wall thickening. Oral contrast transits to the colon. Appendix appears normal. Stable postsurgical changes from end sigmoid colostomy in the ventral left abdominal wall with no wall thickening or acute pericolonic fat stranding in the remnant large-bowel. Stable postsurgical and post treatment changes from abdominoperineal resection, with mildly heterogeneous soft tissue density in the presacral space measuring up to 3.4 cm thickness (series 2/image 113), stable. Vascular/Lymphatic: Atherosclerotic nonaneurysmal abdominal aorta. Patent portal, hepatic, splenic and renal veins. Aortocaval adenopathy measures up to 3.5 cm (series 2/image 77), decreased from 4.3 cm. No new adenopathy in the abdomen or pelvis. Reproductive: Stable top-normal size prostate.  Other: No  pneumoperitoneum. No ascites. No focal fluid collections. Heterogeneous hypodense right psoas muscle 11.2 x 10.6 cm mass (series 2/image 80), previously 11.8 x 11.1 cm, not appreciably changed. Musculoskeletal: No discrete osseous lesions. Mild lumbar spondylosis. IMPRESSION: 1. Acute pulmonary embolism in the right lower lobe, extending proximally to the lobar branch. 2. Positive for acute PE with CT evidence of right heart strain (RV/LV Ratio = 1.1) consistent with at least submassive (intermediate risk) PE. The presence of right heart strain has been associated with an increased risk of morbidity and mortality. 3. Overall substantial progression of metastatic disease, although there are mixed changes as detailed below. 4. Pulmonary metastases have significantly increased in size and number. 5. Liver metastases have significantly increased in size and number. 6. Left supraclavicular and retroperitoneal adenopathy is decreased. 7. Large right psoas muscle metastasis is stable. 8. Mild to moderate right hydroureteronephrosis is mildly increased, although there is no evidence of a delayed right contrast nephrogram, suggesting that the degree of ureteral obstruction is low-grade at this time. 9. Stable right inguinal hernia containing small bowel loops without bowel complication. 10. Stable postsurgical/post-treatment changes in the presacral space from APR. 11.  Aortic Atherosclerosis (ICD10-I70.0). Critical Value/emergent results were called by telephone at the time of interpretation on 04/10/2018 at 2:43 pm to Dr. Truitt Merle , who verbally acknowledged these results. Electronically Signed   By: Ilona Sorrel M.D.   On: 04/10/2018 14:47

## 2018-04-23 NOTE — Telephone Encounter (Signed)
TC from patient. He is requresting an appt in the [facility]  To be evaluated for  Ongoing swelling in his right hip. He states thaere is an enlarged lymph node there. It is tender to the touch.  He has been recently dx'd with PE and wants to talk to Sandi Mealy, PA about that as well as getting a Pulmonologist referral.    Instructed pt to be here for a 1pm [facility] appt. No labs needed at this time.    High priority scheduling message sent.

## 2018-04-24 ENCOUNTER — Inpatient Hospital Stay: Payer: BLUE CROSS/BLUE SHIELD

## 2018-04-24 VITALS — BP 118/90 | HR 72 | Temp 97.8°F | Resp 18

## 2018-04-24 DIAGNOSIS — D500 Iron deficiency anemia secondary to blood loss (chronic): Secondary | ICD-10-CM

## 2018-04-24 DIAGNOSIS — R0609 Other forms of dyspnea: Principal | ICD-10-CM

## 2018-04-24 DIAGNOSIS — Z95828 Presence of other vascular implants and grafts: Secondary | ICD-10-CM

## 2018-04-24 DIAGNOSIS — C20 Malignant neoplasm of rectum: Secondary | ICD-10-CM | POA: Diagnosis not present

## 2018-04-24 DIAGNOSIS — R000 Tachycardia, unspecified: Secondary | ICD-10-CM

## 2018-04-24 DIAGNOSIS — D649 Anemia, unspecified: Secondary | ICD-10-CM

## 2018-04-24 LAB — PREPARE RBC (CROSSMATCH)

## 2018-04-24 MED ORDER — HEPARIN SOD (PORK) LOCK FLUSH 100 UNIT/ML IV SOLN
500.0000 [IU] | Freq: Once | INTRAVENOUS | Status: AC | PRN
  Administered 2018-04-24: 500 [IU]
  Filled 2018-04-24: qty 5

## 2018-04-24 MED ORDER — SODIUM CHLORIDE 0.9 % IV SOLN
Freq: Once | INTRAVENOUS | Status: AC
  Administered 2018-04-24: 12:00:00 via INTRAVENOUS
  Filled 2018-04-24: qty 250

## 2018-04-24 MED ORDER — DIPHENHYDRAMINE HCL 25 MG PO CAPS
ORAL_CAPSULE | ORAL | Status: AC
  Filled 2018-04-24: qty 1

## 2018-04-24 MED ORDER — SODIUM CHLORIDE 0.9% FLUSH
10.0000 mL | INTRAVENOUS | Status: DC | PRN
  Administered 2018-04-24: 10 mL
  Filled 2018-04-24: qty 10

## 2018-04-24 MED ORDER — ACETAMINOPHEN 325 MG PO TABS
650.0000 mg | ORAL_TABLET | Freq: Once | ORAL | Status: AC
  Administered 2018-04-24: 650 mg via ORAL

## 2018-04-24 MED ORDER — ACETAMINOPHEN 160 MG/5ML PO SOLN
ORAL | Status: AC
  Filled 2018-04-24: qty 20.3

## 2018-04-24 MED ORDER — HEPARIN SOD (PORK) LOCK FLUSH 100 UNIT/ML IV SOLN
250.0000 [IU] | INTRAVENOUS | Status: DC | PRN
  Filled 2018-04-24: qty 5

## 2018-04-24 MED ORDER — ACETAMINOPHEN 325 MG PO TABS
ORAL_TABLET | ORAL | Status: AC
  Filled 2018-04-24: qty 2

## 2018-04-24 MED ORDER — DIPHENHYDRAMINE HCL 25 MG PO CAPS
25.0000 mg | ORAL_CAPSULE | Freq: Once | ORAL | Status: AC
  Administered 2018-04-24: 25 mg via ORAL

## 2018-04-24 NOTE — Progress Notes (Signed)
These preliminary result these preliminary results were noted.  Awaiting final report.

## 2018-04-24 NOTE — Patient Instructions (Signed)

## 2018-04-25 ENCOUNTER — Other Ambulatory Visit: Payer: Self-pay | Admitting: Medical

## 2018-04-25 DIAGNOSIS — C20 Malignant neoplasm of rectum: Secondary | ICD-10-CM

## 2018-04-25 DIAGNOSIS — D649 Anemia, unspecified: Secondary | ICD-10-CM

## 2018-04-25 DIAGNOSIS — R140 Abdominal distension (gaseous): Secondary | ICD-10-CM

## 2018-04-25 LAB — TYPE AND SCREEN
ABO/RH(D): O POS
ANTIBODY SCREEN: NEGATIVE
Unit division: 0
Unit division: 0

## 2018-04-25 LAB — BPAM RBC
Blood Product Expiration Date: 201909262359
Blood Product Expiration Date: 201909262359
ISSUE DATE / TIME: 201908281221
ISSUE DATE / TIME: 201908281221
Unit Type and Rh: 5100
Unit Type and Rh: 5100

## 2018-04-26 ENCOUNTER — Inpatient Hospital Stay (HOSPITAL_COMMUNITY)
Admission: EM | Admit: 2018-04-26 | Discharge: 2018-04-28 | DRG: 374 | Disposition: A | Discharge status: Home / Self Care | Payer: BLUE CROSS/BLUE SHIELD | Attending: Internal Medicine | Admitting: Internal Medicine

## 2018-04-26 ENCOUNTER — Inpatient Hospital Stay (HOSPITAL_COMMUNITY): Payer: BLUE CROSS/BLUE SHIELD

## 2018-04-26 ENCOUNTER — Encounter: Payer: BLUE CROSS/BLUE SHIELD | Admitting: Medical

## 2018-04-26 ENCOUNTER — Other Ambulatory Visit: Payer: Self-pay

## 2018-04-26 ENCOUNTER — Encounter: Payer: Self-pay | Admitting: *Deleted

## 2018-04-26 ENCOUNTER — Encounter (HOSPITAL_COMMUNITY): Payer: Self-pay

## 2018-04-26 ENCOUNTER — Emergency Department (HOSPITAL_COMMUNITY): Payer: BLUE CROSS/BLUE SHIELD

## 2018-04-26 ENCOUNTER — Other Ambulatory Visit: Payer: BLUE CROSS/BLUE SHIELD

## 2018-04-26 DIAGNOSIS — R0600 Dyspnea, unspecified: Secondary | ICD-10-CM | POA: Diagnosis present

## 2018-04-26 DIAGNOSIS — Z8042 Family history of malignant neoplasm of prostate: Secondary | ICD-10-CM | POA: Diagnosis not present

## 2018-04-26 DIAGNOSIS — J910 Malignant pleural effusion: Secondary | ICD-10-CM | POA: Diagnosis present

## 2018-04-26 DIAGNOSIS — D61818 Other pancytopenia: Secondary | ICD-10-CM

## 2018-04-26 DIAGNOSIS — Z66 Do not resuscitate: Secondary | ICD-10-CM | POA: Diagnosis not present

## 2018-04-26 DIAGNOSIS — Z7901 Long term (current) use of anticoagulants: Secondary | ICD-10-CM | POA: Diagnosis not present

## 2018-04-26 DIAGNOSIS — D630 Anemia in neoplastic disease: Secondary | ICD-10-CM

## 2018-04-26 DIAGNOSIS — R180 Malignant ascites: Secondary | ICD-10-CM | POA: Diagnosis present

## 2018-04-26 DIAGNOSIS — E43 Unspecified severe protein-calorie malnutrition: Secondary | ICD-10-CM | POA: Diagnosis present

## 2018-04-26 DIAGNOSIS — Z87891 Personal history of nicotine dependence: Secondary | ICD-10-CM

## 2018-04-26 DIAGNOSIS — D649 Anemia, unspecified: Secondary | ICD-10-CM

## 2018-04-26 DIAGNOSIS — R0603 Acute respiratory distress: Secondary | ICD-10-CM | POA: Diagnosis not present

## 2018-04-26 DIAGNOSIS — Z933 Colostomy status: Secondary | ICD-10-CM | POA: Diagnosis not present

## 2018-04-26 DIAGNOSIS — C772 Secondary and unspecified malignant neoplasm of intra-abdominal lymph nodes: Secondary | ICD-10-CM | POA: Diagnosis present

## 2018-04-26 DIAGNOSIS — C787 Secondary malignant neoplasm of liver and intrahepatic bile duct: Secondary | ICD-10-CM | POA: Diagnosis present

## 2018-04-26 DIAGNOSIS — D72829 Elevated white blood cell count, unspecified: Secondary | ICD-10-CM

## 2018-04-26 DIAGNOSIS — Z86711 Personal history of pulmonary embolism: Secondary | ICD-10-CM

## 2018-04-26 DIAGNOSIS — I2699 Other pulmonary embolism without acute cor pulmonale: Secondary | ICD-10-CM | POA: Diagnosis present

## 2018-04-26 DIAGNOSIS — Z882 Allergy status to sulfonamides status: Secondary | ICD-10-CM | POA: Diagnosis not present

## 2018-04-26 DIAGNOSIS — R64 Cachexia: Secondary | ICD-10-CM | POA: Diagnosis present

## 2018-04-26 DIAGNOSIS — Z86718 Personal history of other venous thrombosis and embolism: Secondary | ICD-10-CM

## 2018-04-26 DIAGNOSIS — C7989 Secondary malignant neoplasm of other specified sites: Secondary | ICD-10-CM | POA: Diagnosis present

## 2018-04-26 DIAGNOSIS — Z823 Family history of stroke: Secondary | ICD-10-CM | POA: Diagnosis not present

## 2018-04-26 DIAGNOSIS — C7801 Secondary malignant neoplasm of right lung: Secondary | ICD-10-CM | POA: Diagnosis present

## 2018-04-26 DIAGNOSIS — Z515 Encounter for palliative care: Secondary | ICD-10-CM | POA: Diagnosis not present

## 2018-04-26 DIAGNOSIS — C782 Secondary malignant neoplasm of pleura: Secondary | ICD-10-CM | POA: Diagnosis present

## 2018-04-26 DIAGNOSIS — C20 Malignant neoplasm of rectum: Secondary | ICD-10-CM | POA: Diagnosis present

## 2018-04-26 DIAGNOSIS — J90 Pleural effusion, not elsewhere classified: Secondary | ICD-10-CM | POA: Diagnosis present

## 2018-04-26 DIAGNOSIS — C189 Malignant neoplasm of colon, unspecified: Secondary | ICD-10-CM | POA: Diagnosis not present

## 2018-04-26 DIAGNOSIS — R0602 Shortness of breath: Secondary | ICD-10-CM

## 2018-04-26 DIAGNOSIS — C799 Secondary malignant neoplasm of unspecified site: Secondary | ICD-10-CM

## 2018-04-26 DIAGNOSIS — C7802 Secondary malignant neoplasm of left lung: Secondary | ICD-10-CM | POA: Diagnosis present

## 2018-04-26 DIAGNOSIS — R188 Other ascites: Secondary | ICD-10-CM | POA: Diagnosis present

## 2018-04-26 DIAGNOSIS — Z7189 Other specified counseling: Secondary | ICD-10-CM | POA: Diagnosis not present

## 2018-04-26 DIAGNOSIS — Z825 Family history of asthma and other chronic lower respiratory diseases: Secondary | ICD-10-CM | POA: Diagnosis not present

## 2018-04-26 DIAGNOSIS — Z681 Body mass index (BMI) 19 or less, adult: Secondary | ICD-10-CM | POA: Diagnosis not present

## 2018-04-26 LAB — LACTATE DEHYDROGENASE, PLEURAL OR PERITONEAL FLUID: LD, Fluid: 1050 U/L — ABNORMAL HIGH (ref 3–23)

## 2018-04-26 LAB — COMPREHENSIVE METABOLIC PANEL
ALK PHOS: 881 U/L — AB (ref 38–126)
ALT: 39 U/L (ref 0–44)
AST: 110 U/L — AB (ref 15–41)
Albumin: 2.8 g/dL — ABNORMAL LOW (ref 3.5–5.0)
Anion gap: 18 — ABNORMAL HIGH (ref 5–15)
BILIRUBIN TOTAL: 2.2 mg/dL — AB (ref 0.3–1.2)
BUN: 25 mg/dL — AB (ref 8–23)
CALCIUM: 9 mg/dL (ref 8.9–10.3)
CO2: 20 mmol/L — ABNORMAL LOW (ref 22–32)
CREATININE: 1.16 mg/dL (ref 0.61–1.24)
Chloride: 97 mmol/L — ABNORMAL LOW (ref 98–111)
GFR calc Af Amer: >60 mL/min (ref >60)
GFR calc non Af Amer: >60 mL/min (ref >60)
Glucose, Bld: 117 mg/dL — ABNORMAL HIGH (ref 70–99)
Potassium: 4.9 mmol/L (ref 3.5–5.1)
Sodium: 135 mmol/L (ref 135–145)
TOTAL PROTEIN: 6.3 g/dL — AB (ref 6.5–8.1)

## 2018-04-26 LAB — CBC WITH DIFFERENTIAL/PLATELET
Basophils Absolute: 0 10*3/uL (ref 0.0–0.1)
Basophils Relative: 0 %
EOS ABS: 0 10*3/uL (ref 0.0–0.7)
EOS PCT: 0 %
HCT: 28.8 % — ABNORMAL LOW (ref 39.0–52.0)
Hemoglobin: 9.3 g/dL — ABNORMAL LOW (ref 13.0–17.0)
Lymphocytes Relative: 1 %
Lymphs Abs: 0.4 10*3/uL — ABNORMAL LOW (ref 0.7–4.0)
MCH: 30.4 pg (ref 26.0–34.0)
MCHC: 32.3 g/dL (ref 30.0–36.0)
MCV: 94.1 fL (ref 78.0–100.0)
MONO ABS: 3.7 10*3/uL — AB (ref 0.1–1.0)
Monocytes Relative: 10 %
NEUTROS ABS: 32.9 10*3/uL — AB (ref 1.7–7.7)
NRBC: 2 /100{WBCs} — AB
Neutrophils Relative %: 89 %
PLATELETS: 307 10*3/uL (ref 150–400)
RBC: 3.06 MIL/uL — AB (ref 4.22–5.81)
RDW: 22.5 % — AB (ref 11.5–15.5)
WBC: 37 10*3/uL — ABNORMAL HIGH (ref 4.0–10.5)

## 2018-04-26 LAB — PROTIME-INR
INR: 4.4
PROTHROMBIN TIME: 41.7 s — AB (ref 11.4–15.2)

## 2018-04-26 LAB — PROTEIN, PLEURAL OR PERITONEAL FLUID: Total protein, fluid: 3.1 g/dL

## 2018-04-26 LAB — GLUCOSE, PLEURAL OR PERITONEAL FLUID: GLUCOSE FL: 76 mg/dL

## 2018-04-26 LAB — BODY FLUID CELL COUNT WITH DIFFERENTIAL
EOS FL: 0 %
Lymphs, Fluid: 2 %
Monocyte-Macrophage-Serous Fluid: 4 % — ABNORMAL LOW (ref 50–90)
NEUTROPHIL FLUID: 94 % — AB (ref 0–25)
Total Nucleated Cell Count, Fluid: 10183 cu mm — ABNORMAL HIGH (ref 0–1000)

## 2018-04-26 LAB — GRAM STAIN

## 2018-04-26 MED ORDER — PANTOPRAZOLE SODIUM 40 MG PO TBEC
40.0000 mg | DELAYED_RELEASE_TABLET | Freq: Every day | ORAL | Status: DC
  Administered 2018-04-26 – 2018-04-28 (×3): 40 mg via ORAL
  Filled 2018-04-26 (×3): qty 1

## 2018-04-26 MED ORDER — IOPAMIDOL (ISOVUE-370) INJECTION 76%
100.0000 mL | Freq: Once | INTRAVENOUS | Status: AC | PRN
  Administered 2018-04-26: 100 mL via INTRAVENOUS

## 2018-04-26 MED ORDER — HYDROCODONE-ACETAMINOPHEN 5-325 MG PO TABS: 1.0000 | ORAL_TABLET | ORAL | Status: DC | PRN

## 2018-04-26 MED ORDER — ONDANSETRON HCL 4 MG PO TABS: 4.0000 mg | ORAL_TABLET | Freq: Four times a day (QID) | ORAL | Status: DC | PRN

## 2018-04-26 MED ORDER — ACETAMINOPHEN 650 MG RE SUPP: 650.0000 mg | Freq: Four times a day (QID) | RECTAL | Status: DC | PRN

## 2018-04-26 MED ORDER — LIDOCAINE HCL 1 % IJ SOLN
INTRAMUSCULAR | Status: AC
  Filled 2018-04-26: qty 10

## 2018-04-26 MED ORDER — ACETAMINOPHEN 325 MG PO TABS: 650.0000 mg | ORAL_TABLET | Freq: Four times a day (QID) | ORAL | Status: DC | PRN

## 2018-04-26 MED ORDER — HYDROMORPHONE HCL 1 MG/ML IJ SOLN: 1.0000 mg | INTRAMUSCULAR | Status: DC | PRN

## 2018-04-26 MED ORDER — IOPAMIDOL (ISOVUE-370) INJECTION 76%
INTRAVENOUS | Status: AC
  Filled 2018-04-26: qty 100

## 2018-04-26 MED ORDER — ADULT MULTIVITAMIN W/MINERALS CH
1.0000 | ORAL_TABLET | ORAL | Status: DC
  Filled 2018-04-26: qty 1

## 2018-04-26 MED ORDER — VITAMIN K1 10 MG/ML IJ SOLN
5.0000 mg | Freq: Once | INTRAMUSCULAR | Status: AC
  Administered 2018-04-26: 5 mg via SUBCUTANEOUS
  Filled 2018-04-26: qty 0.5

## 2018-04-26 MED ORDER — SODIUM CHLORIDE 0.9% FLUSH
10.0000 mL | INTRAVENOUS | Status: DC | PRN
  Administered 2018-04-28: 10 mL
  Filled 2018-04-26: qty 40

## 2018-04-26 MED ORDER — IOPAMIDOL (ISOVUE-M 300) INJECTION 61%: 15.0000 mL | Freq: Once | INTRAMUSCULAR | Status: DC | PRN

## 2018-04-26 MED ORDER — ENSURE ENLIVE PO LIQD: 237.0000 mL | Freq: Two times a day (BID) | ORAL | Status: DC

## 2018-04-26 MED ORDER — MAGNESIUM OXIDE 400 (241.3 MG) MG PO TABS
400.0000 mg | ORAL_TABLET | Freq: Two times a day (BID) | ORAL | Status: DC
  Administered 2018-04-26 – 2018-04-27 (×3): 400 mg via ORAL
  Filled 2018-04-26 (×3): qty 1

## 2018-04-26 MED ORDER — NYSTATIN 100000 UNIT/ML MT SUSP
5.0000 mL | Freq: Every day | OROMUCOSAL | Status: DC
  Administered 2018-04-26 – 2018-04-28 (×3): 500000 [IU] via ORAL
  Filled 2018-04-26 (×3): qty 5

## 2018-04-26 MED ORDER — ONDANSETRON HCL 4 MG/2ML IJ SOLN
4.0000 mg | Freq: Four times a day (QID) | INTRAMUSCULAR | Status: DC | PRN
  Administered 2018-04-27: 4 mg via INTRAVENOUS
  Filled 2018-04-26: qty 2

## 2018-04-26 NOTE — Progress Notes (Signed)
[facility] Clinical Social Work  Late Note 04/24/18: Patient's daughter requested to speak with CSW in [place] office regarding next steps for help at home and overall guidance on goals of care.  CSW met with patient and patient's daughter in Symptom Management area.  CSW explained concept of both [facility] and Hospice care as services under [facility].  David Clarke requested CSW discuss further with his partner and expressed concerns about ensuring she and daughter David Clarke have support through this time.  04/26/17 1520: CSW met with patient's partner, David Clarke, in [employer] area. She reported patient is being admitted and she is unsure of next steps.  CSW explored Kelly's concerns, fears, and hopes surrounding patient's illness.  She discussed practical concerns including managing his finances, not having conversations about David Clarke's wishes for care at end of life, and how to balance work at this time.  Patient's partner is interested in a goals of care discussion at this time.  CSW discussed with Dr. Burr Medico.  CSW contacted patient's partner and notified her Dr. Burr Medico would follow up and further address these concerns.    Gwinda Maine, LCSW  Clinical Social Worker [facility]

## 2018-04-26 NOTE — ED Notes (Signed)
Date and time results received: 04/26/18 1209 Test: INR  Critical Value: 4.4  Name of Provider Notified:

## 2018-04-26 NOTE — ED Triage Notes (Signed)
EMS reports from home c/o abdominal swelling, ascites. Hx of liver and colon cancer. PCP Dx Pt with PE x 2 weeks, Rx xarelto.  BP 125/95 Hr 68 RR 18 Sp02 96 RA

## 2018-04-26 NOTE — Progress Notes (Addendum)
David Clarke   DOB:21-Jan-1956   QQ#:[id number]   EPP#:[id number]  Oncology follow up   Subjective: Patient is well-known to me, under my care for his metastatic colon cancer. He has progressed rapidly. He was admitted for worsening dyspnea, and a work-up showed large ascites, bilateral pleural effusion, likely malignant, and cancer progression on the CAT scan.  He underwent ultrasound-guided right thoracentesis by IR this afternoon, and his dyspnea has improved.  He is not on oxygen.  Objective:  Vitals:   04/26/18 1547 04/26/18 1557  BP: (!) 119/98 113/90  Pulse:    Resp:    Temp:    SpO2:      There is no height or weight on file to calculate BMI. No intake or output data in the 24 hours ending 04/26/18 1848   Sclerae unicteric, very cachectic  Oropharynx clear  No peripheral adenopathy  Lungs clear --reduced breath sounds on bilateral lung base  Heart regular rate and rhythm  Abdomen distended  MSK no focal spinal tenderness, no peripheral edema  Neuro nonfocal    CBG (last 3)  No results for input(s): GLUCAP in the last 72 hours.   Labs:  Lab Results  Component Value Date   WBC 37.0 (H) 04/26/2018   HGB 9.3 (L) 04/26/2018   HCT 28.8 (L) 04/26/2018   MCV 94.1 04/26/2018   PLT 307 04/26/2018   NEUTROABS 32.9 (H) 04/26/2018    CMP Latest Ref Rng & Units 04/26/2018 04/23/2018 04/18/2018  Glucose 70 - 99 mg/dL 117(H) 99 118(H)  BUN 8 - 23 mg/dL 25(H) 20 14  Creatinine 0.61 - 1.24 mg/dL 1.16 0.83 0.78  Sodium 135 - 145 mmol/L 135 132(L) 134(L)  Potassium 3.5 - 5.1 mmol/L 4.9 4.0 3.7  Chloride 98 - 111 mmol/L 97(L) 99 99  CO2 22 - 32 mmol/L 20(L) 19(L) 22  Calcium 8.9 - 10.3 mg/dL 9.0 8.5(L) 8.8(L)  Total Protein 6.5 - 8.1 g/dL 6.3(L) 5.5(L) 6.1(L)  Total Bilirubin 0.3 - 1.2 mg/dL 2.2(H) 1.1 0.7  Alkaline Phos 38 - 126 U/L 881(H) 851(H) 937(H)  AST 15 - 41 U/L 110(H) 70(H) 47(H)  ALT 0 - 44 U/L 39 23 19    Urine Studies No results for input(s): UHGB, CRYS in the last  72 hours.  Invalid input(s): UACOL, UAPR, USPG, UPH, UTP, UGL, UKET, UBIL, UNIT, UROB, ULEU, UEPI, UWBC, URBC, UBAC, CAST, UCOM, BILUA  Basic Metabolic Panel: Recent Labs  Lab 04/23/18 1435 04/26/18 1116  NA 132* 135  K 4.0 4.9  CL 99 97*  CO2 19* 20*  GLUCOSE 99 117*  BUN 20 25*  CREATININE 0.83 1.16  CALCIUM 8.5* 9.0   GFR Estimated Creatinine Clearance: 52.9 mL/min (by C-G formula based on SCr of 1.16 mg/dL). Liver Function Tests: Recent Labs  Lab 04/23/18 1435 04/26/18 1116  AST 70* 110*  ALT 23 39  ALKPHOS 851* 881*  BILITOT 1.1 2.2*  PROT 5.5* 6.3*  ALBUMIN 2.3* 2.8*   No results for input(s): LIPASE, AMYLASE in the last 168 hours. No results for input(s): AMMONIA in the last 168 hours. Coagulation profile Recent Labs  Lab 04/26/18 1116  INR 4.40*    CBC: Recent Labs  Lab 04/23/18 1435 04/26/18 1116  WBC 42.1* 37.0*  NEUTROABS 40.8* 32.9*  HGB 6.6* 9.3*  HCT 20.8* 28.8*  MCV 97.7 94.1  PLT 321 307   Cardiac Enzymes: No results for input(s): CKTOTAL, CKMB, CKMBINDEX, TROPONINI in the last 168 hours. BNP: Invalid input(s): POCBNP  CBG: No results for input(s): GLUCAP in the last 168 hours. D-Dimer No results for input(s): DDIMER in the last 72 hours. Hgb A1c No results for input(s): HGBA1C in the last 72 hours. Lipid Profile No results for input(s): CHOL, HDL, LDLCALC, TRIG, CHOLHDL, LDLDIRECT in the last 72 hours. Thyroid function studies No results for input(s): TSH, T4TOTAL, T3FREE, THYROIDAB in the last 72 hours.  Invalid input(s): FREET3 Anemia work up No results for input(s): VITAMINB12, FOLATE, FERRITIN, TIBC, IRON, RETICCTPCT in the last 72 hours. Microbiology No results found for this or any previous visit (from the past 240 hour(s)).    Studies:  Dg Chest 1 View  Result Date: 04/26/2018 CLINICAL DATA:  Post RIGHT thoracentesis EXAM: CHEST  1 VIEW COMPARISON:  Portable exam 1626 hours compared to earlier study of 04/26/2018 at  1150 hours FINDINGS: Decreased RIGHT pleural effusion and basilar atelectasis post thoracentesis. No pneumothorax. Multiple BILATERAL pulmonary nodules consistent with pulmonary metastatic disease. Stable heart size. Tortuous aorta. RIGHT jugular Port-A-Cath with tip projecting over high RIGHT atrium. Bones demineralized. IMPRESSION: No pneumothorax following RIGHT thoracentesis. Decreased RIGHT pleural effusion and basilar atelectasis. Multiple BILATERAL pulmonary metastases. Electronically Signed   By: Lavonia Dana M.D.   On: 04/26/2018 16:50   Ct Angio Chest Pe W/cm &/or Wo Cm  Result Date: 04/26/2018 CLINICAL DATA:  Two-day history of shortness of breath. Current history of rectal cancer for which the patient completed chemotherapy on 04/04/2018. 30 pound weight loss since cancer diagnosis. No os to be output over the past 3 days. Known hepatic and pulmonary metastatic disease. Recent diagnosis of pulmonary embolism 04/10/2018. EXAM: CT ANGIOGRAPHY CHEST CT ABDOMEN AND PELVIS WITH CONTRAST TECHNIQUE: Multidetector CT imaging of the chest was performed using the standard protocol during bolus administration of intravenous contrast. Multiplanar CT image reconstructions and MIPs were obtained to evaluate the vascular anatomy. Multidetector CT imaging of the abdomen and pelvis was performed using the standard protocol during bolus administration of intravenous contrast. CONTRAST:  140mL ISOVUE-370 IOPAMIDOL INJECTION 76% IV. COMPARISON:  04/10/2018, 01/08/2018, and earlier, including PET-CT 10/17/2017. FINDINGS: Respiratory motion blurred images throughout the examination. I do believe that the evaluation is diagnostic, however. CTA CHEST FINDINGS Cardiovascular: Occlusive filling defect within the RIGHT LOWER LOBE pulmonary artery and several of its branches as noted on the CTA chest two weeks ago. There has been no interval recanalization of any of the affected branches. No new filling defects in the pulmonary  arteries elsewhere in either lung. RIGHT heart strain is again demonstrated with enlargement of the RIGHT ventricle. No visible atherosclerosis involving the thoracic aorta or the proximal great vessels. Dystrophic calcification in the aortic arch at the insertion of the ligamentum arteriosum. No evidence of thoracic aortic aneurysm. Mediastinum/Nodes: LEFT supraclavicular lymph node measuring approximately 1.3 cm short axis, unchanged since the examination 2 weeks ago. No new or enlarging lymphadenopathy in the chest. Normal appearing esophagus. Normal-appearing thyroid gland. Lungs/Pleura: Innumerable pulmonary parenchymal metastases as noted previously, the largest measuring approximately 2.2 cm in the LEFT UPPER LOBE (series 8, image 45, slightly increased in size even since the examination 2 weeks ago. Interval development of large BILATERAL pleural effusions, RIGHT greater than LEFT, with associated dense passive atelectasis in the lower lobes. Pleural metastases are visible posteriorly at the RIGHT base. Musculoskeletal: No acute findings. No evidence of osseous metastatic disease. Degenerative disc disease and spondylosis involving the LOWER cervical spine. Review of the MIP images confirms the above findings. CT ABDOMEN and PELVIS FINDINGS Hepatobiliary:  Innumerable liver metastases with marked hepatic enlargement as noted previously. Index masses were measured on the examination 2 weeks ago. Gallbladder normal in appearance without calcified gallstones. No biliary ductal dilation. Pancreas: Normal in appearance without evidence of mass, ductal dilation, or inflammation. Spleen: Normal in size and appearance. Adrenals/Urinary Tract: Normal appearing adrenal glands. Severe RIGHT hydronephrosis, unchanged, related to obstruction by a large RIGHT psoas muscle metastasis as noted previously. No evidence of LEFT hydronephrosis. Cortical cysts involving the RIGHT kidney. No visible urinary tract calculi.  Stomach/Bowel: Stomach decompressed and unremarkable. Normal-appearing small bowel without evidence of obstruction. Normal-appearing descending colostomy to the LEFT of midline at the umbilical level. The descending colon is decompressed. The transverse colon is upper normal in caliber, filled with gas. Moderate stool burden in the normal caliber ascending colon and cecum. No obstructing colonic lesion is identified. No evidence of parastomal hernia. Surgically absent rectum related to the prior low AP resection. Vascular/Lymphatic: Mild aortic atherosclerosis without aneurysm. 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 Patent portal venous system. Retroperitoneal lymphadenopathy in the aortocaval and LEFT periaortic regions of the retroperitoneum as noted previously, measured on the examination 2 weeks ago. Reproductive: Prostate gland and seminal vesicles normal in size and appearance for age. Other: Multiple additional findings including: 1. Anasarca. 2. Large volume malignant ascites which has increased since the examination 2 weeks ago. 3. Large RIGHT inguinal hernia containing the cecal tip and a large amount of ascites. 4. Small LEFT inguinal hernia containing ascites. 5. Post radiation changes involving the presacral space, stable. Musculoskeletal: No acute findings. No evidence of osseous metastatic disease. Review of the MIP images confirms the above findings. IMPRESSION: CTA Chest: 1. No change in the occlusive thrombus involving the RIGHT LOWER LOBE pulmonary artery and several of its branches when compared to the prior CT 2 weeks ago. 2. No new pulmonary emboli elsewhere. 3. No change in the appearance of RIGHT heart strain with enlargement of the RIGHT ventricle. 4. New large BILATERAL pleural effusions, RIGHT greater than LEFT, likely malignant effusions. 5. Associated passive atelectasis in the lower lobes. 6. Innumerable lung metastases as noted previously, the largest in the [place] which has actually  increased slightly in size since the examination 2 weeks ago. 7. Stable LEFT supraclavicular lymph node. No evidence of lymphadenopathy elsewhere in the chest. CT Abdomen Pelvis: 1. Thrombus involving the infrarenal IVC. 2. Satisfactory appearance to the descending colostomy. The descending colon is decompressed. There is no evidence of an obstructing colonic lesion. 3. Multiple large hepatic metastases as noted previously, accounting for hepatomegaly. 4. Large amount of malignant ascites, increased in amount since the CT 2 weeks ago. 5. Large necrotic metastasis involving the RIGHT psoas muscle as noted previously. 6. Stable severe RIGHT hydronephrosis related to obstruction by the RIGHT psoas muscle metastasis. 7. Retroperitoneal nodal metastases in the aortocaval and LEFT periaortic regions as noted previously. 8. Large RIGHT inguinal hernia containing the cecal tip and ascites. Small LEFT inguinal hernia containing ascites. 9. Post surgical and post radiation changes in the presacral space as noted previously. 10. Anasarca. Electronically Signed   By: Evangeline Dakin M.D.   On: 04/26/2018 13:55   Ct Abdomen Pelvis W Contrast  Result Date: 04/26/2018 CLINICAL DATA:  Two-day history of shortness of breath. Current history of rectal cancer for which the patient completed chemotherapy on 04/04/2018. 30 pound weight loss since cancer diagnosis. No os to be output over the past 3 days. Known hepatic and pulmonary metastatic disease. Recent diagnosis of  pulmonary embolism 04/10/2018. EXAM: CT ANGIOGRAPHY CHEST CT ABDOMEN AND PELVIS WITH CONTRAST TECHNIQUE: Multidetector CT imaging of the chest was performed using the standard protocol during bolus administration of intravenous contrast. Multiplanar CT image reconstructions and MIPs were obtained to evaluate the vascular anatomy. Multidetector CT imaging of the abdomen and pelvis was performed using the standard protocol during bolus administration of intravenous  contrast. CONTRAST:  163mL ISOVUE-370 IOPAMIDOL INJECTION 76% IV. COMPARISON:  04/10/2018, 01/08/2018, and earlier, including PET-CT 10/17/2017. FINDINGS: Respiratory motion blurred images throughout the examination. I do believe that the evaluation is diagnostic, however. CTA CHEST FINDINGS Cardiovascular: Occlusive filling defect within the RIGHT LOWER LOBE pulmonary artery and several of its branches as noted on the CTA chest two weeks ago. There has been no interval recanalization of any of the affected branches. No new filling defects in the pulmonary arteries elsewhere in either lung. RIGHT heart strain is again demonstrated with enlargement of the RIGHT ventricle. No visible atherosclerosis involving the thoracic aorta or the proximal great vessels. Dystrophic calcification in the aortic arch at the insertion of the ligamentum arteriosum. No evidence of thoracic aortic aneurysm. Mediastinum/Nodes: LEFT supraclavicular lymph node measuring approximately 1.3 cm short axis, unchanged since the examination 2 weeks ago. No new or enlarging lymphadenopathy in the chest. Normal appearing esophagus. Normal-appearing thyroid gland. Lungs/Pleura: Innumerable pulmonary parenchymal metastases as noted previously, the largest measuring approximately 2.2 cm in the LEFT UPPER LOBE (series 8, image 45, slightly increased in size even since the examination 2 weeks ago. Interval development of large BILATERAL pleural effusions, RIGHT greater than LEFT, with associated dense passive atelectasis in the lower lobes. Pleural metastases are visible posteriorly at the RIGHT base. Musculoskeletal: No acute findings. No evidence of osseous metastatic disease. Degenerative disc disease and spondylosis involving the LOWER cervical spine. Review of the MIP images confirms the above findings. CT ABDOMEN and PELVIS FINDINGS Hepatobiliary: Innumerable liver metastases with marked hepatic enlargement as noted previously. Index masses were  measured on the examination 2 weeks ago. Gallbladder normal in appearance without calcified gallstones. No biliary ductal dilation. Pancreas: Normal in appearance without evidence of mass, ductal dilation, or inflammation. Spleen: Normal in size and appearance. Adrenals/Urinary Tract: Normal appearing adrenal glands. Severe RIGHT hydronephrosis, unchanged, related to obstruction by a large RIGHT psoas muscle metastasis as noted previously. No evidence of LEFT hydronephrosis. Cortical cysts involving the RIGHT kidney. No visible urinary tract calculi. Stomach/Bowel: Stomach decompressed and unremarkable. Normal-appearing small bowel without evidence of obstruction. Normal-appearing descending colostomy to the LEFT of midline at the umbilical level. The descending colon is decompressed. The transverse colon is upper normal in caliber, filled with gas. Moderate stool burden in the normal caliber ascending colon and cecum. No obstructing colonic lesion is identified. No evidence of parastomal hernia. Surgically absent rectum related to the prior low AP resection. Vascular/Lymphatic: Mild aortic atherosclerosis without aneurysm. 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 Patent portal venous system. Retroperitoneal lymphadenopathy in the aortocaval and LEFT periaortic regions of the retroperitoneum as noted previously, measured on the examination 2 weeks ago. Reproductive: Prostate gland and seminal vesicles normal in size and appearance for age. Other: Multiple additional findings including: 1. Anasarca. 2. Large volume malignant ascites which has increased since the examination 2 weeks ago. 3. Large RIGHT inguinal hernia containing the cecal tip and a large amount of ascites. 4. Small LEFT inguinal hernia containing ascites. 5. Post radiation changes involving the presacral space, stable. Musculoskeletal: No acute findings. No evidence of osseous metastatic disease. Review of  the MIP images confirms the above findings.  IMPRESSION: CTA Chest: 1. No change in the occlusive thrombus involving the RIGHT LOWER LOBE pulmonary artery and several of its branches when compared to the prior CT 2 weeks ago. 2. No new pulmonary emboli elsewhere. 3. No change in the appearance of RIGHT heart strain with enlargement of the RIGHT ventricle. 4. New large BILATERAL pleural effusions, RIGHT greater than LEFT, likely malignant effusions. 5. Associated passive atelectasis in the lower lobes. 6. Innumerable lung metastases as noted previously, the largest in the [place] which has actually increased slightly in size since the examination 2 weeks ago. 7. Stable LEFT supraclavicular lymph node. No evidence of lymphadenopathy elsewhere in the chest. CT Abdomen Pelvis: 1. Thrombus involving the infrarenal IVC. 2. Satisfactory appearance to the descending colostomy. The descending colon is decompressed. There is no evidence of an obstructing colonic lesion. 3. Multiple large hepatic metastases as noted previously, accounting for hepatomegaly. 4. Large amount of malignant ascites, increased in amount since the CT 2 weeks ago. 5. Large necrotic metastasis involving the RIGHT psoas muscle as noted previously. 6. Stable severe RIGHT hydronephrosis related to obstruction by the RIGHT psoas muscle metastasis. 7. Retroperitoneal nodal metastases in the aortocaval and LEFT periaortic regions as noted previously. 8. Large RIGHT inguinal hernia containing the cecal tip and ascites. Small LEFT inguinal hernia containing ascites. 9. Post surgical and post radiation changes in the presacral space as noted previously. 10. Anasarca. Electronically Signed   By: Evangeline Dakin M.D.   On: 04/26/2018 13:55   US Abdomen Limited  Result Date: 04/26/2018 CLINICAL DATA:  62 year-old with metastatic disease. Evaluate for ascites. EXAM: LIMITED ABDOMEN ULTRASOUND FOR ASCITES TECHNIQUE: Limited ultrasound survey for ascites was performed in all four abdominal  quadrants. COMPARISON:  CT 04/26/2018 FINDINGS: Small amount ascites in the right lower quadrant. Hepatic lesions identified in the right upper abdomen. Mass in the left upper quadrant is likely related to the liver. No significant fluid identified on the left side of the abdomen. IMPRESSION: Small amount of ascites identified in the abdomen. Paracentesis not performed. Large metastatic liver lesions. Electronically Signed   By: Markus Daft M.D.   On: 04/26/2018 16:29   Dg Abd Acute W/chest  Result Date: 04/26/2018 CLINICAL DATA:  Abdominal pain and swelling, history of PE and colon and liver carcinoma EXAM: DG ABDOMEN ACUTE W/ 1V CHEST COMPARISON:  CT from 04/10/2018 FINDINGS: Cardiac shadow is within normal limits. Right chest wall port is seen. Scattered pulmonary nodules are noted consistent with metastatic disease. Small right pleural effusion is noted with basilar changes consistent with atelectasis superimposed over the pulmonary metastatic disease. No pneumothorax is noted. Ostomy is noted in the left mid abdomen. Soft tissue fullness in the upper abdomen is noted consistent with the known history of liver metastatic disease. No free air is seen. No obstructive changes are noted. IMPRESSION: Changes consistent with known hepatic and pulmonary metastatic disease. Associated right-sided pleural effusion and atelectasis is noted. Electronically Signed   By: Inez Catalina M.D.   On: 04/26/2018 12:32   US Thoracentesis Asp Pleural Space W/img Guide  Result Date: 04/26/2018 INDICATION: Metastatic lung cancer. Shortness of breath. Right-sided pleural effusion. Request diagnostic and therapeutic thoracentesis. EXAM: ULTRASOUND GUIDED RIGHT THORACENTESIS MEDICATIONS: None. COMPLICATIONS: None immediate. PROCEDURE: An ultrasound guided thoracentesis was thoroughly discussed with the patient and questions answered. The benefits, risks, alternatives and complications were also discussed. The patient understands  and wishes to proceed with the  procedure. Written consent was obtained. Ultrasound was performed to localize and mark an adequate pocket of fluid in the right chest. The area was then prepped and draped in the normal sterile fashion. 1% Lidocaine was used for local anesthesia. Under ultrasound guidance a 6 Fr Safe-T-Centesis catheter was introduced. Thoracentesis was performed. The catheter was removed and a dressing applied. FINDINGS: A total of approximately 850 mL of dark, bloody fluid was removed. Samples were sent to the laboratory as requested by the clinical team. IMPRESSION: Successful ultrasound guided right thoracentesis yielding 850 mL of pleural fluid. Read by: Ascencion Dike PA-C Electronically Signed   By: Markus Daft M.D.   On: 04/26/2018 16:16    Assessment: 62 y.o. with metastatic colon cancer, on chemo, was admitted for DVT/PE and pancrytopenia   1.  New onset large volume ascites and bilateral pleural effusion, likely malignant  2.  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  Anemia and malignancy 4.  Leukocytosis, likely reactive 5.  Severe calorie and protein malnutrition 6.  Recent history of PE and DVT, on Xarelto 7. Deconditioning    Plan:  -I had a long conversation with patient, his significant other cavity and his daughter Alla German.  Patient has previously failed multiple line chemotherapy, he is certainly not a candidate for chemotherapy or any other anti-cancer treatment due to his severe malnutrition, extremely poor performance status.  The goal of care is comfort, symptom management, which both patient and his family members agree. -We discussed CODE STATUS, I recommend DNR and DNI.  Patient is still reluctant to made that decision, but will think about  -we discussed management of pleural effusion and ascites, possibility of Pleurx if he has rapid reaccumulation of pleural effusion.  -We discussed discharge plan,  including home hospice versus residential hospice, which I think he is qualified.  I expect his life expectancy will be a few weeks, or even shorter. -Both [name] and [place] agree with residential hospice, but patient is not completely ready.  Again he states he will think about it. -Please consult palliative care, and I will send a message to Dr. Domingo Cocking, he will see him tomorrow, to continue discussed the goal of care and hospice arrangement. -My partner Dr. Alvy Bimler is on call this weekend, I will sign out to her, please call us as needed.  I will return on Tuesday. -I spoke with Dr. Tana Coast this afternoon, and I appreciate the excellent care from hospitalist team   I spent a total of 60 mins on his care today, >50% of face-to-face consultation.  Truitt Merle, MD 04/26/2018  6:48 PM  Cell [phone]

## 2018-04-26 NOTE — H&P (Signed)
History and Physical        Hospital Admission Note Date: 04/26/2018  Patient name: David Clarke Medical record number: [id number] Date of birth: [birth year] Age: 62 y.o. Gender: male  PCP: Truitt Merle, MD    Patient coming from: Home  I have reviewed all records in the [facility].    Chief Complaint:  Shortness of breath with new abdominal distention, worsening in the last 3 days  HPI: Patient is a 62 year old male with metastatic rectal adenocarcinoma, neuropathy, normocytic anemia, severe protein calorie malnutrition who was recently discharged on 8/17 with Xarelto for acute pulmonary embolism, right lower extremity DVT.  Per patient in the last 3 days, he has been feeling significantly short of breath, abdominal distention and ascites.  States he never had ascites before.  No chest pain, nausea, vomiting or any significant abdominal pain.  He feels that shortness of breath is worse on sitting up and exertion.  No fevers or chills, has slight dry cough.  Last Xarelto dose on 04/25/2018 evening.  ED work-up/course:  Afebrile, respiratory rate 18, pulse 103, BP 120/92, O2 sats 97% on room air  Review of Systems: Positives marked in 'bold' Constitutional: Denies fever, chills, diaphoresis, + poor appetite and fatigue.  HEENT: Denies photophobia, eye pain, redness, hearing loss, ear pain, congestion, sore throat, rhinorrhea, sneezing, mouth sores, trouble swallowing, neck pain, neck stiffness and tinnitus.   Respiratory: See HPI Cardiovascular: Denies chest pain, palpitations,+ mild leg swelling bilaterally.  Gastrointestinal: Denies nausea, vomiting, abdominal pain, diarrhea, constipation, blood in stool and + abdominal distention.  Genitourinary: Denies dysuria, urgency, frequency, hematuria, flank pain and difficulty urinating.  Musculoskeletal: Denies myalgias, back  pain, joint swelling, arthralgias and gait problem.  Skin: Denies pallor, rash and wound.  Neurological: Denies dizziness, seizures, syncope, light-headedness, numbness and headaches. + Generalized weakness Hematological: Denies adenopathy. Easy bruising, personal or family bleeding history  Psychiatric/Behavioral: Denies suicidal ideation, mood changes, confusion, nervousness, sleep disturbance and agitation  Past Medical History: Past Medical History:  Diagnosis Date  . Allergy   . Inguinal hernia   . rectal ca 10/09/2016   rectal adenocarcinoma    Past Surgical History:  Procedure Laterality Date  . COLONOSCOPY W/ BIOPSIES Bilateral 10/09/2016   rectal adenocarcinoma  . IR FLUORO GUIDE PORT INSERTION RIGHT  11/13/2017  . IR US GUIDE VASC ACCESS RIGHT  11/13/2017  . MOUTH SURGERY     age 20, to remove extra "eye" teeth  . [place] teeth extraction    . XI ROBOT ABDOMINAL PERINEAL RESECTION N/A 02/21/2017   Procedure: XI ROBOT ABDOMINOPERINEAL RESECTION WITH COLOSTOMY;  Surgeon: Michael Boston, MD;  Location: [facility];  Service: General;  Laterality: N/A;    Medications: Prior to Admission medications   Medication Sig Start Date End Date Taking? Authorizing Provider  Cannabinoids (THC FREE PO) Inhale into the lungs. THC Oil via vape as needed for sleep   Yes [provider]  clindamycin (CLINDAGEL) 1 % gel Apply topically 2 (two) times daily. 03/14/18  Yes Truitt Merle, MD  ibuprofen (ADVIL,MOTRIN) 200 MG tablet Take 200-600 mg by mouth daily as needed for moderate pain.   Yes [provider]  KLOR-CON M20 20 MEQ tablet TAKE 1 TABLET BY MOUTH TWICE A DAY Patient taking differently: Take 20 mEq by mouth daily.  04/17/18  Yes Alla Feeling, NP  magnesium oxide (MAG-OX) 400 (241.3 Mg) MG tablet Take 1 tablet (400 mg total) by mouth 2 (two) times daily. 03/21/18  Yes Alla Feeling, NP  Multiple Vitamin (MULTIVITAMIN WITH MINERALS) TABS tablet Take 1 tablet by mouth every  other day.    Yes [provider]  nystatin (MYCOSTATIN) 100000 UNIT/ML suspension Take 5 mLs (500,000 Units total) by mouth 4 (four) times daily. Patient taking differently: Take 5 mLs by mouth daily. Takes a maintenance dose 01/10/18  Yes Truitt Merle, MD  omeprazole (PRILOSEC) 40 MG capsule Take 1 capsule (40 mg total) by mouth daily. 02/14/18  Yes Truitt Merle, MD  Rivaroxaban 15 & 20 MG TBPK Take as directed on package: Start with one 15mg  tablet by mouth twice a day with food. On Day 22 (on 05/04/2018), switch to one 20mg  tablet once a day with food. 04/13/18  Yes Arnulfo Batson K, MD  rivaroxaban (XARELTO) 20 MG TABS tablet Take 1 tablet (20 mg total) by mouth daily with supper. Please start this dose after you have completed the starter oack 05/04/18   Bianna Haran, Vernelle Emerald, MD    Allergies:   Allergies  Allergen Reactions  . Sulfa Antibiotics Nausea Only and Other (See Comments)    Reaction may years ago, pt thinks nausea only.     Social History:  reports that he quit smoking about 17 years ago. His smoking use included cigarettes. He quit after 2.00 years of use. He has never used smokeless tobacco. He reports that he drank about 10.0 standard drinks of alcohol per week. He reports that he does not use drugs.  Family History: Family History  Problem Relation Age of Onset  . Other Mother        bile duct tumor  . Stroke Mother   . Cancer Mother 21       bile duct cancer   . Prostate cancer Father 72  . Emphysema Father   . Colon cancer Neg Hx   . Esophageal cancer Neg Hx   . Pancreatic cancer Neg Hx   . Rectal cancer Neg Hx   . Stomach cancer Neg Hx     Physical Exam: Blood pressure 111/84, pulse (!) 101, resp. rate (!) 25, SpO2 99 %. General: Alert, awake, oriented x3, in no acute distress. Eyes: pink conjunctiva,anicteric sclera, pupils equal and reactive to light and accomodation, HEENT: normocephalic, atraumatic, oropharynx clear Neck: supple, no masses or lymphadenopathy,  no goiter, no bruits, no JVD CVS: Regular rate and rhythm, without murmurs, rubs or gallops. No lower extremity edema Resp : Clear to auscultation bilaterally, no wheezing, rales or rhonchi. GI : Soft, nontender, nondistended, positive bowel sounds, no masses. No hepatomegaly. No hernia.  Musculoskeletal: No clubbing or cyanosis, positive pedal pulses. No contracture. ROM intact  Neuro: Grossly intact, no focal neurological deficits, strength 5/5 upper and lower extremities bilaterally Psych: alert and oriented x 3, normal mood and affect Skin: no rashes or lesions, warm and dry   LABS on Admission: I have personally reviewed all the labs and imagings below    Basic Metabolic Panel: Recent Labs  Lab 04/23/18 1435 04/26/18 1116  NA 132* 135  K 4.0 4.9  CL 99 97*  CO2 19* 20*  GLUCOSE 99 117*  BUN 20 25*  CREATININE 0.83 1.16  CALCIUM  8.5* 9.0   Liver Function Tests: Recent Labs  Lab 04/23/18 1435 04/26/18 1116  AST 70* 110*  ALT 23 39  ALKPHOS 851* 881*  BILITOT 1.1 2.2*  PROT 5.5* 6.3*  ALBUMIN 2.3* 2.8*   No results for input(s): LIPASE, AMYLASE in the last 168 hours. No results for input(s): AMMONIA in the last 168 hours. CBC: Recent Labs  Lab 04/23/18 1435 04/26/18 1116  WBC 42.1* 37.0*  NEUTROABS 40.8* 32.9*  HGB 6.6* 9.3*  HCT 20.8* 28.8*  MCV 97.7 94.1  PLT 321 307   Cardiac Enzymes: No results for input(s): CKTOTAL, CKMB, CKMBINDEX, TROPONINI in the last 168 hours. BNP: Invalid input(s): POCBNP CBG: No results for input(s): GLUCAP in the last 168 hours.  Radiological Exams on Admission:  Ct Angio Chest Pe W/cm &/or Wo Cm  Result Date: 04/26/2018 CLINICAL DATA:  Two-day history of shortness of breath. Current history of rectal cancer for which the patient completed chemotherapy on 04/04/2018. 30 pound weight loss since cancer diagnosis. No os to be output over the past 3 days. Known hepatic and pulmonary metastatic disease. Recent diagnosis of  pulmonary embolism 04/10/2018. EXAM: CT ANGIOGRAPHY CHEST CT ABDOMEN AND PELVIS WITH CONTRAST TECHNIQUE: Multidetector CT imaging of the chest was performed using the standard protocol during bolus administration of intravenous contrast. Multiplanar CT image reconstructions and MIPs were obtained to evaluate the vascular anatomy. Multidetector CT imaging of the abdomen and pelvis was performed using the standard protocol during bolus administration of intravenous contrast. CONTRAST:  162mL ISOVUE-370 IOPAMIDOL INJECTION 76% IV. COMPARISON:  04/10/2018, 01/08/2018, and earlier, including PET-CT 10/17/2017. FINDINGS: Respiratory motion blurred images throughout the examination. I do believe that the evaluation is diagnostic, however. CTA CHEST FINDINGS Cardiovascular: Occlusive filling defect within the RIGHT LOWER LOBE pulmonary artery and several of its branches as noted on the CTA chest two weeks ago. There has been no interval recanalization of any of the affected branches. No new filling defects in the pulmonary arteries elsewhere in either lung. RIGHT heart strain is again demonstrated with enlargement of the RIGHT ventricle. No visible atherosclerosis involving the thoracic aorta or the proximal great vessels. Dystrophic calcification in the aortic arch at the insertion of the ligamentum arteriosum. No evidence of thoracic aortic aneurysm. Mediastinum/Nodes: LEFT supraclavicular lymph node measuring approximately 1.3 cm short axis, unchanged since the examination 2 weeks ago. No new or enlarging lymphadenopathy in the chest. Normal appearing esophagus. Normal-appearing thyroid gland. Lungs/Pleura: Innumerable pulmonary parenchymal metastases as noted previously, the largest measuring approximately 2.2 cm in the LEFT UPPER LOBE (series 8, image 45, slightly increased in size even since the examination 2 weeks ago. Interval development of large BILATERAL pleural effusions, RIGHT greater than LEFT, with associated  dense passive atelectasis in the lower lobes. Pleural metastases are visible posteriorly at the RIGHT base. Musculoskeletal: No acute findings. No evidence of osseous metastatic disease. Degenerative disc disease and spondylosis involving the LOWER cervical spine. Review of the MIP images confirms the above findings. CT ABDOMEN and PELVIS FINDINGS Hepatobiliary: Innumerable liver metastases with marked hepatic enlargement as noted previously. Index masses were measured on the examination 2 weeks ago. Gallbladder normal in appearance without calcified gallstones. No biliary ductal dilation. Pancreas: Normal in appearance without evidence of mass, ductal dilation, or inflammation. Spleen: Normal in size and appearance. Adrenals/Urinary Tract: Normal appearing adrenal glands. Severe RIGHT hydronephrosis, unchanged, related to obstruction by a large RIGHT psoas muscle metastasis as noted previously. No evidence of LEFT hydronephrosis. Cortical cysts involving the  RIGHT kidney. No visible urinary tract calculi. Stomach/Bowel: Stomach decompressed and unremarkable. Normal-appearing small bowel without evidence of obstruction. Normal-appearing descending colostomy to the LEFT of midline at the umbilical level. The descending colon is decompressed. The transverse colon is upper normal in caliber, filled with gas. Moderate stool burden in the normal caliber ascending colon and cecum. No obstructing colonic lesion is identified. No evidence of parastomal hernia. Surgically absent rectum related to the prior low AP resection. Vascular/Lymphatic: Mild aortic atherosclerosis without aneurysm. 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 Patent portal venous system. Retroperitoneal lymphadenopathy in the aortocaval and LEFT periaortic regions of the retroperitoneum as noted previously, measured on the examination 2 weeks ago. Reproductive: Prostate gland and seminal vesicles normal in size and appearance for age. Other: Multiple additional  findings including: 1. Anasarca. 2. Large volume malignant ascites which has increased since the examination 2 weeks ago. 3. Large RIGHT inguinal hernia containing the cecal tip and a large amount of ascites. 4. Small LEFT inguinal hernia containing ascites. 5. Post radiation changes involving the presacral space, stable. Musculoskeletal: No acute findings. No evidence of osseous metastatic disease. Review of the MIP images confirms the above findings. IMPRESSION: CTA Chest: 1. No change in the occlusive thrombus involving the RIGHT LOWER LOBE pulmonary artery and several of its branches when compared to the prior CT 2 weeks ago. 2. No new pulmonary emboli elsewhere. 3. No change in the appearance of RIGHT heart strain with enlargement of the RIGHT ventricle. 4. New large BILATERAL pleural effusions, RIGHT greater than LEFT, likely malignant effusions. 5. Associated passive atelectasis in the lower lobes. 6. Innumerable lung metastases as noted previously, the largest in the [place] which has actually increased slightly in size since the examination 2 weeks ago. 7. Stable LEFT supraclavicular lymph node. No evidence of lymphadenopathy elsewhere in the chest. CT Abdomen Pelvis: 1. Thrombus involving the infrarenal IVC. 2. Satisfactory appearance to the descending colostomy. The descending colon is decompressed. There is no evidence of an obstructing colonic lesion. 3. Multiple large hepatic metastases as noted previously, accounting for hepatomegaly. 4. Large amount of malignant ascites, increased in amount since the CT 2 weeks ago. 5. Large necrotic metastasis involving the RIGHT psoas muscle as noted previously. 6. Stable severe RIGHT hydronephrosis related to obstruction by the RIGHT psoas muscle metastasis. 7. Retroperitoneal nodal metastases in the aortocaval and LEFT periaortic regions as noted previously. 8. Large RIGHT inguinal hernia containing the cecal tip and ascites. Small LEFT inguinal hernia  containing ascites. 9. Post surgical and post radiation changes in the presacral space as noted previously. 10. Anasarca. Electronically Signed   By: Evangeline Dakin M.D.   On: 04/26/2018 13:55   Ct Abdomen Pelvis W Contrast  Result Date: 04/26/2018 CLINICAL DATA:  Two-day history of shortness of breath. Current history of rectal cancer for which the patient completed chemotherapy on 04/04/2018. 30 pound weight loss since cancer diagnosis. No os to be output over the past 3 days. Known hepatic and pulmonary metastatic disease. Recent diagnosis of pulmonary embolism 04/10/2018. EXAM: CT ANGIOGRAPHY CHEST CT ABDOMEN AND PELVIS WITH CONTRAST TECHNIQUE: Multidetector CT imaging of the chest was performed using the standard protocol during bolus administration of intravenous contrast. Multiplanar CT image reconstructions and MIPs were obtained to evaluate the vascular anatomy. Multidetector CT imaging of the abdomen and pelvis was performed using the standard protocol during bolus administration of intravenous contrast. CONTRAST:  174mL ISOVUE-370 IOPAMIDOL INJECTION 76% IV. COMPARISON:  04/10/2018, 01/08/2018, and earlier, including PET-CT  10/17/2017. FINDINGS: Respiratory motion blurred images throughout the examination. I do believe that the evaluation is diagnostic, however. CTA CHEST FINDINGS Cardiovascular: Occlusive filling defect within the RIGHT LOWER LOBE pulmonary artery and several of its branches as noted on the CTA chest two weeks ago. There has been no interval recanalization of any of the affected branches. No new filling defects in the pulmonary arteries elsewhere in either lung. RIGHT heart strain is again demonstrated with enlargement of the RIGHT ventricle. No visible atherosclerosis involving the thoracic aorta or the proximal great vessels. Dystrophic calcification in the aortic arch at the insertion of the ligamentum arteriosum. No evidence of thoracic aortic aneurysm. Mediastinum/Nodes: LEFT  supraclavicular lymph node measuring approximately 1.3 cm short axis, unchanged since the examination 2 weeks ago. No new or enlarging lymphadenopathy in the chest. Normal appearing esophagus. Normal-appearing thyroid gland. Lungs/Pleura: Innumerable pulmonary parenchymal metastases as noted previously, the largest measuring approximately 2.2 cm in the LEFT UPPER LOBE (series 8, image 45, slightly increased in size even since the examination 2 weeks ago. Interval development of large BILATERAL pleural effusions, RIGHT greater than LEFT, with associated dense passive atelectasis in the lower lobes. Pleural metastases are visible posteriorly at the RIGHT base. Musculoskeletal: No acute findings. No evidence of osseous metastatic disease. Degenerative disc disease and spondylosis involving the LOWER cervical spine. Review of the MIP images confirms the above findings. CT ABDOMEN and PELVIS FINDINGS Hepatobiliary: Innumerable liver metastases with marked hepatic enlargement as noted previously. Index masses were measured on the examination 2 weeks ago. Gallbladder normal in appearance without calcified gallstones. No biliary ductal dilation. Pancreas: Normal in appearance without evidence of mass, ductal dilation, or inflammation. Spleen: Normal in size and appearance. Adrenals/Urinary Tract: Normal appearing adrenal glands. Severe RIGHT hydronephrosis, unchanged, related to obstruction by a large RIGHT psoas muscle metastasis as noted previously. No evidence of LEFT hydronephrosis. Cortical cysts involving the RIGHT kidney. No visible urinary tract calculi. Stomach/Bowel: Stomach decompressed and unremarkable. Normal-appearing small bowel without evidence of obstruction. Normal-appearing descending colostomy to the LEFT of midline at the umbilical level. The descending colon is decompressed. The transverse colon is upper normal in caliber, filled with gas. Moderate stool burden in the normal caliber ascending colon and  cecum. No obstructing colonic lesion is identified. No evidence of parastomal hernia. Surgically absent rectum related to the prior low AP resection. Vascular/Lymphatic: Mild aortic atherosclerosis without aneurysm. 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 Patent portal venous system. Retroperitoneal lymphadenopathy in the aortocaval and LEFT periaortic regions of the retroperitoneum as noted previously, measured on the examination 2 weeks ago. Reproductive: Prostate gland and seminal vesicles normal in size and appearance for age. Other: Multiple additional findings including: 1. Anasarca. 2. Large volume malignant ascites which has increased since the examination 2 weeks ago. 3. Large RIGHT inguinal hernia containing the cecal tip and a large amount of ascites. 4. Small LEFT inguinal hernia containing ascites. 5. Post radiation changes involving the presacral space, stable. Musculoskeletal: No acute findings. No evidence of osseous metastatic disease. Review of the MIP images confirms the above findings. IMPRESSION: CTA Chest: 1. No change in the occlusive thrombus involving the RIGHT LOWER LOBE pulmonary artery and several of its branches when compared to the prior CT 2 weeks ago. 2. No new pulmonary emboli elsewhere. 3. No change in the appearance of RIGHT heart strain with enlargement of the RIGHT ventricle. 4. New large BILATERAL pleural effusions, RIGHT greater than LEFT, likely malignant effusions. 5. Associated passive atelectasis in the lower lobes.  6. Innumerable lung metastases as noted previously, the largest in the [place] which has actually increased slightly in size since the examination 2 weeks ago. 7. Stable LEFT supraclavicular lymph node. No evidence of lymphadenopathy elsewhere in the chest. CT Abdomen Pelvis: 1. Thrombus involving the infrarenal IVC. 2. Satisfactory appearance to the descending colostomy. The descending colon is decompressed. There is no evidence of an obstructing colonic  lesion. 3. Multiple large hepatic metastases as noted previously, accounting for hepatomegaly. 4. Large amount of malignant ascites, increased in amount since the CT 2 weeks ago. 5. Large necrotic metastasis involving the RIGHT psoas muscle as noted previously. 6. Stable severe RIGHT hydronephrosis related to obstruction by the RIGHT psoas muscle metastasis. 7. Retroperitoneal nodal metastases in the aortocaval and LEFT periaortic regions as noted previously. 8. Large RIGHT inguinal hernia containing the cecal tip and ascites. Small LEFT inguinal hernia containing ascites. 9. Post surgical and post radiation changes in the presacral space as noted previously. 10. Anasarca. Electronically Signed   By: Evangeline Dakin M.D.   On: 04/26/2018 13:55   Dg Abd Acute W/chest  Result Date: 04/26/2018 CLINICAL DATA:  Abdominal pain and swelling, history of PE and colon and liver carcinoma EXAM: DG ABDOMEN ACUTE W/ 1V CHEST COMPARISON:  CT from 04/10/2018 FINDINGS: Cardiac shadow is within normal limits. Right chest wall port is seen. Scattered pulmonary nodules are noted consistent with metastatic disease. Small right pleural effusion is noted with basilar changes consistent with atelectasis superimposed over the pulmonary metastatic disease. No pneumothorax is noted. Ostomy is noted in the left mid abdomen. Soft tissue fullness in the upper abdomen is noted consistent with the known history of liver metastatic disease. No free air is seen. No obstructive changes are noted. IMPRESSION: Changes consistent with known hepatic and pulmonary metastatic disease. Associated right-sided pleural effusion and atelectasis is noted. Electronically Signed   By: Inez Catalina M.D.   On: 04/26/2018 12:32       Assessment/Plan Principal Problem: Acute dyspnea in the setting of new ascites and bilateral pleural effusions right greater than left in the setting of metastatic rectal cancer -Admit to telemetry, likely pleural fluid  and ascites secondary to malignancy -Patient took his last dose of Xarelto on 04/25/2018 evening, also hypercoagulable with an INR of 4.4 -IR consulted for ultrasound-guided thoracentesis and paracentesis, will likely need albumin -Place n.p.o. after midnight for procedures, hold Xarelto for now -Discussed with Dr Burr Medico who will also evaluate patient  Active Problems:   Rectal adenocarcinoma [facility]), metastatic now presenting with new arthritis and new bilateral pleural effusions -CT scans shows progressive disease, metathesis -Will appreciate oncology recommendations, discussed with Dr. Burr Medico    Pulmonary embolism [facility]) bilateral with acute RLE DVT -Recently diagnosed during previous admission, currently on therapeutic twice daily dosing of Xarelto -Hold Xarelto for IR procedures, last dose on 04/25/2018 p.m.   Normocytic anemia -H&H currently stable at baseline  Severe protein calorie malnutrition - will place on nutritional supplements  DVT prophylaxis: SCD's   CODE STATUS: full code   Consults called: oncology   Family Communication: Admission, patients condition and plan of care including tests being ordered have been discussed with the patient and fiance, daughter who indicates understanding and agree with the plan and Code Status  Admission status: inpatient tele   Disposition plan: Further plan will depend as patient's clinical course evolves and further radiologic and laboratory data become available.    At the time of admission, it appears that the  appropriate admission status for this patient is INPATIENT . This is judged to be reasonable and necessary in order to provide the required intensity of service to ensure the patient's safety given the presenting symptoms of dyspnea, ascites  and pleural effusions, physical exam findings, and initial radiographic and laboratory data in the context of their chronic comorbidities.  The medical decision making on this patient was of  high complexity and the patient is at high risk for clinical deterioration, therefore this is a level 3 visit.   Time Spent on Admission: 86mins     Natoshia Souter M.D. [facility] 04/26/2018, 2:49 PM Pager: [phone]  If 7PM-7AM, please contact night-coverage [email or web address] Password [email or web address]

## 2018-04-26 NOTE — ED Provider Notes (Signed)
[facility] DEPT Provider Note   CSN: [id number] Arrival date & time: 04/26/18  1014     History   Chief Complaint Chief Complaint  Patient presents with  . Bloated  . Ascites    HPI BRIGGS EDELEN is a 62 y.o. male.  62 year old male with prior medical history as documented below presents with complaint of shortness of breath. Patient reports that when he sits up he feels short of breath. No associated fever, chest pain, or abdominal pain. He is not short of breath when lying flat. Symptoms gradually worsening over the last 3-4 days.   Patient was recently diagnosed with PE - he is compliant with his Xarelto.   He [facility] an [facility] - but he felt worse and came to the ED instead.   The history is provided by the patient and medical records.  Shortness of Breath  This is a new problem. The average episode lasts 4 days. The problem occurs rarely.The current episode started more than 2 days ago. The problem has been gradually worsening. Pertinent negatives include no fever, no headaches, no cough and no chest pain. He has tried nothing for the symptoms.    Past Medical History:  Diagnosis Date  . Allergy   . Inguinal hernia   . 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 Protein-calorie malnutrition, severe 04/11/2018  . Pulmonary embolism ([place]) 04/10/2018  . Thrombocytopenia ([place]) 04/10/2018  . Anemia 04/10/2018  . Port-A-Cath in place 02/27/2018  . Goals of care, counseling/discussion 10/30/2017  . 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 Fever   . Dehydration   . Diarrhea   . 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 Hypokalemia 12/03/2016  . Leukopenia 12/03/2016  . Prolonged Q-T interval on ECG 12/03/2016  . Oral thrush 12/03/2016  . Hemochromatosis 11/19/2016  . Family history of hemochromatosis  10/25/2016  . Iron deficiency anemia due to chronic blood loss 10/25/2016  . Rectal adenocarcinoma ([place]) 10/05/2016    Past Surgical History:  Procedure Laterality Date  . COLONOSCOPY W/ BIOPSIES Bilateral 10/09/2016   rectal adenocarcinoma  . IR FLUORO GUIDE PORT INSERTION RIGHT  11/13/2017  . IR US GUIDE VASC ACCESS RIGHT  11/13/2017  . MOUTH SURGERY     age 65, to remove extra "eye" teeth  . [place] teeth extraction    . XI ROBOT ABDOMINAL PERINEAL RESECTION N/A 02/21/2017   Procedure: XI ROBOT ABDOMINOPERINEAL RESECTION WITH COLOSTOMY;  Surgeon: Michael Boston, MD;  Location: [facility];  Service: General;  Laterality: N/A;        Home Medications    Prior to Admission medications   Medication Sig Start Date End Date Taking? Authorizing Provider  Cannabinoids (THC FREE PO) Inhale into the lungs. THC Oil via vape as needed for sleep   Yes [provider]  clindamycin (CLINDAGEL) 1 % gel Apply topically 2 (two) times daily. 03/14/18  Yes Truitt Merle, MD  ibuprofen (ADVIL,MOTRIN) 200 MG tablet Take 200-600 mg by mouth daily as needed for moderate pain.   Yes [provider]  KLOR-CON M20 20 MEQ tablet TAKE 1 TABLET BY MOUTH TWICE A DAY Patient taking differently: Take 20 mEq by mouth daily.  04/17/18  Yes Alla Feeling, NP  magnesium oxide (MAG-OX) 400 (241.3 Mg) MG tablet Take 1 tablet (400 mg total) by mouth 2 (two) times  daily. 03/21/18  Yes Alla Feeling, NP  Multiple Vitamin (MULTIVITAMIN WITH MINERALS) TABS tablet Take 1 tablet by mouth every other day.    Yes [provider]  nystatin (MYCOSTATIN) 100000 UNIT/ML suspension Take 5 mLs (500,000 Units total) by mouth 4 (four) times daily. Patient taking differently: Take 5 mLs by mouth daily. Takes a maintenance dose 01/10/18  Yes Truitt Merle, MD  omeprazole (PRILOSEC) 40 MG capsule Take 1 capsule (40 mg total) by mouth daily. 02/14/18  Yes Truitt Merle, MD  Rivaroxaban 15 & 20 MG TBPK Take as directed on package:  Start with one 15mg  tablet by mouth twice a day with food. On Day 22 (on 05/04/2018), switch to one 20mg  tablet once a day with food. 04/13/18  Yes Rai, Ripudeep K, MD  rivaroxaban (XARELTO) 20 MG TABS tablet Take 1 tablet (20 mg total) by mouth daily with supper. Please start this dose after you have completed the starter oack 05/04/18   Rai, Vernelle Emerald, MD    Family History Family History  Problem Relation Age of Onset  . Other Mother        bile duct tumor  . Stroke Mother   . Cancer Mother 73       bile duct cancer   . Prostate cancer Father 76  . Emphysema Father   . Colon cancer Neg Hx   . Esophageal cancer Neg Hx   . Pancreatic cancer Neg Hx   . Rectal cancer Neg Hx   . Stomach cancer Neg Hx     Social History Social History   Tobacco Use  . Smoking status: Former Smoker    Years: 2.00    Types: Cigarettes    Last attempt to quit: 2002    Years since quitting: 17.6  . Smokeless tobacco: Never Used  Substance Use Topics  . Alcohol use: Not Currently    Alcohol/week: 10.0 standard drinks    Types: 4 Glasses of wine, 6 Cans of beer per week    Comment: beer or a glass or wine most days of the week  . Drug use: No     Allergies   Sulfa antibiotics   Review of Systems Review of Systems  Constitutional: Negative for fever.  Respiratory: Positive for shortness of breath. Negative for cough.   Cardiovascular: Negative for chest pain.  Neurological: Negative for headaches.  All other systems reviewed and are negative.    Physical Exam Updated Vital Signs BP 109/84   Pulse (!) 101   Resp 19   SpO2 99%   Physical Exam  Constitutional: He is oriented to person, place, and time. He appears well-developed and well-nourished. No distress.  HENT:  Head: Normocephalic and atraumatic.  Mouth/Throat: Oropharynx is clear and moist.  Eyes: Pupils are equal, round, and reactive to light. Conjunctivae and EOM are normal.  Neck: Normal range of motion. Neck supple.    Cardiovascular: Normal rate, regular rhythm and normal heart sounds.  Pulmonary/Chest: Effort normal and breath sounds normal. No respiratory distress.  Decreased BS at right base   Abdominal: Soft. He exhibits no distension. There is no tenderness. There is no guarding.  Musculoskeletal: Normal range of motion. He exhibits no edema or deformity.  Neurological: He is alert and oriented to person, place, and time.  Skin: Skin is warm and dry.  Psychiatric: He has a normal mood and affect.  Nursing note and vitals reviewed.    ED Treatments / Results  Labs (all labs ordered  are listed, but only abnormal results are displayed) Labs Reviewed  COMPREHENSIVE METABOLIC PANEL - Abnormal; Notable for the following components:      Result Value   Chloride 97 (*)    CO2 20 (*)    Glucose, Bld 117 (*)    BUN 25 (*)    Total Protein 6.3 (*)    Albumin 2.8 (*)    AST 110 (*)    Alkaline Phosphatase 881 (*)    Total Bilirubin 2.2 (*)    Anion gap 18 (*)    All other components within normal limits  CBC WITH DIFFERENTIAL/PLATELET - Abnormal; Notable for the following components:   WBC 37.0 (*)    RBC 3.06 (*)    Hemoglobin 9.3 (*)    HCT 28.8 (*)    RDW 22.5 (*)    nRBC 2 (*)    Neutro Abs 32.9 (*)    Lymphs Abs 0.4 (*)    Monocytes Absolute 3.7 (*)    All other components within normal limits  PROTIME-INR - Abnormal; Notable for the following components:   Prothrombin Time 41.7 (*)    INR 4.40 (*)    All other components within normal limits    EKG None  Radiology Dg Abd Acute W/chest  Result Date: 04/26/2018 CLINICAL DATA:  Abdominal pain and swelling, history of PE and colon and liver carcinoma EXAM: DG ABDOMEN ACUTE W/ 1V CHEST COMPARISON:  CT from 04/10/2018 FINDINGS: Cardiac shadow is within normal limits. Right chest wall port is seen. Scattered pulmonary nodules are noted consistent with metastatic disease. Small right pleural effusion is noted with basilar changes  consistent with atelectasis superimposed over the pulmonary metastatic disease. No pneumothorax is noted. Ostomy is noted in the left mid abdomen. Soft tissue fullness in the upper abdomen is noted consistent with the known history of liver metastatic disease. No free air is seen. No obstructive changes are noted. IMPRESSION: Changes consistent with known hepatic and pulmonary metastatic disease. Associated right-sided pleural effusion and atelectasis is noted. Electronically Signed   By: Inez Catalina M.D.   On: 04/26/2018 12:32    Procedures Procedures (including critical care time)  Medications Ordered in ED Medications  iopamidol (ISOVUE-M) 61 % intrathecal injection 15 mL (has no administration in time range)  iopamidol (ISOVUE-370) 76 % injection (has no administration in time range)  iopamidol (ISOVUE-370) 76 % injection 100 mL (100 mLs Intravenous Contrast Given 04/26/18 1257)     Initial Impression / Assessment and Plan / ED Course  I have reviewed the triage vital signs and the nursing notes.  Pertinent labs & imaging results that were available during my care of the patient were reviewed by me and considered in my medical decision making (see chart for details).     MDM  Screen complete  Patient is presenting for evaluation of reported shortness of breath.  Screening evaluations are significant with new findings of bilateral pleural effusions with concurrent ascites.  Patient requires admission for further work-up and treatment.  Hospitalist service is aware of case will evaluate for admission.  Final Clinical Impressions(s) / ED Diagnoses   Final diagnoses:  Dyspnea, unspecified type    ED Discharge Orders    None       Valarie Merino, MD 04/26/18 1432

## 2018-04-26 NOTE — Procedures (Signed)
PROCEDURE SUMMARY:  Successful US guided right thoracentesis. Yielded 850 mL of dark bloody fluid. Pt tolerated procedure well. No immediate complications.  Specimen was sent for labs. CXR ordered.  Ascencion Dike PA-C 04/26/2018 4:11 PM

## 2018-04-26 NOTE — ED Notes (Signed)
Bed: WA02 Expected date:  Expected time:  Means of arrival:  Comments: 62 yo abd swelling

## 2018-04-27 DIAGNOSIS — D649 Anemia, unspecified: Secondary | ICD-10-CM

## 2018-04-27 DIAGNOSIS — Z515 Encounter for palliative care: Secondary | ICD-10-CM

## 2018-04-27 DIAGNOSIS — J90 Pleural effusion, not elsewhere classified: Secondary | ICD-10-CM

## 2018-04-27 DIAGNOSIS — E43 Unspecified severe protein-calorie malnutrition: Secondary | ICD-10-CM

## 2018-04-27 DIAGNOSIS — C20 Malignant neoplasm of rectum: Principal | ICD-10-CM

## 2018-04-27 DIAGNOSIS — R180 Malignant ascites: Secondary | ICD-10-CM

## 2018-04-27 DIAGNOSIS — R0603 Acute respiratory distress: Secondary | ICD-10-CM

## 2018-04-27 DIAGNOSIS — Z7189 Other specified counseling: Secondary | ICD-10-CM

## 2018-04-27 LAB — CBC
HCT: 20.6 % — ABNORMAL LOW (ref 39.0–52.0)
HCT: 30.8 % — ABNORMAL LOW (ref 39.0–52.0)
Hemoglobin: 6.7 g/dL — CL (ref 13.0–17.0)
Hemoglobin: 10.2 g/dL — ABNORMAL LOW (ref 13.0–17.0)
MCH: 30.7 pg (ref 26.0–34.0)
MCH: 30.6 pg (ref 26.0–34.0)
MCHC: 32.5 g/dL (ref 30.0–36.0)
MCHC: 33.1 g/dL (ref 30.0–36.0)
MCV: 94.5 fL (ref 78.0–100.0)
MCV: 92.5 fL (ref 78.0–100.0)
PLATELETS: 251 10*3/uL (ref 150–400)
PLATELETS: 205 10*3/uL (ref 150–400)
RBC: 2.18 MIL/uL — AB (ref 4.22–5.81)
RBC: 3.33 MIL/uL — AB (ref 4.22–5.81)
RDW: 23.3 % — AB (ref 11.5–15.5)
RDW: 18.6 % — AB (ref 11.5–15.5)
WBC: 31.6 10*3/uL — AB (ref 4.0–10.5)
WBC: 33.3 10*3/uL — AB (ref 4.0–10.5)

## 2018-04-27 LAB — BASIC METABOLIC PANEL
Anion gap: 13 (ref 5–15)
BUN: 35 mg/dL — ABNORMAL HIGH (ref 8–23)
CALCIUM: 8.5 mg/dL — AB (ref 8.9–10.3)
CO2: 21 mmol/L — ABNORMAL LOW (ref 22–32)
CREATININE: 1.35 mg/dL — AB (ref 0.61–1.24)
Chloride: 101 mmol/L (ref 98–111)
GFR calc Af Amer: >60 mL/min (ref >60)
GFR, EST NON AFRICAN AMERICAN: 55 mL/min — AB (ref >60)
Glucose, Bld: 111 mg/dL — ABNORMAL HIGH (ref 70–99)
Potassium: 4.9 mmol/L (ref 3.5–5.1)
SODIUM: 135 mmol/L (ref 135–145)

## 2018-04-27 LAB — PREPARE RBC (CROSSMATCH)

## 2018-04-27 MED ORDER — LORAZEPAM 2 MG/ML PO CONC: 0.5000 mg | Freq: Four times a day (QID) | ORAL | Status: DC | PRN

## 2018-04-27 MED ORDER — ATROPINE SULFATE 1 % OP SOLN
3.0000 [drp] | Freq: Four times a day (QID) | OPHTHALMIC | Status: DC | PRN
  Filled 2018-04-27: qty 2

## 2018-04-27 MED ORDER — SODIUM CHLORIDE 0.9% IV SOLUTION
Freq: Once | INTRAVENOUS | Status: AC
  Administered 2018-04-27: 10:00:00 via INTRAVENOUS

## 2018-04-27 MED ORDER — OXYCODONE HCL 5 MG/5ML PO SOLN
2.5000 mg | ORAL | Status: DC | PRN
  Administered 2018-04-27: 2.5 mg via ORAL
  Filled 2018-04-27: qty 5

## 2018-04-27 NOTE — Progress Notes (Signed)
[facility] PROGRESS NOTE    Progress Note  David Clarke  FTD:[id number] DOB: December 18, 1955 DOA: 04/26/2018 PCP: Truitt Merle, MD     Brief Narrative:   David Clarke is an 62 y.o. male past medical history of metastatic rectal carcinoma, normocytic anemia severe protein caloric malnutrition who was discharged on 817 on Xarelto for acute pulmonary embolism for the last 3 days he has been significantly short of breath with abdominal distention and ascites  Assessment/Plan:   Acute dyspnea in the setting of new bilateral right greater than right pleural effusion in the setting of metastatic rectal cancer: Uncertain about malignancy, IR was consulted to perform ultrasound-guided thoracocentesis with 850 cc of dark bloody fluid cytology is pending. Oncology was consulted not a candidate for chemotherapy or any other antineoplastic regimen due to his extremely poor performance.  They recommended hospice and comfort care but the patient is resistant to it.  The oncology expressed that his life expectancy is days to weeks.  Rectal adenocarcinoma ([place]) with new thoracocentesis: CT scan showed progressive disease. Oncology was consulted that deemed not a candidate for antineoplastic therapy and recommended comfort care.  Bilateral pulmonary embolism: Cont to hold on Xarelto twice a day.  Normocytic anemia: On Xarelto at home now with a significant drop in hemoglobin from 9.3-6.7.  Will transfuse 2 units of packed red blood cells.  Severe protein caloric malnutrition:   DVT prophylaxis: lovenox Family Communication:partner Disposition Plan/Barrier to D/C: hospice tmrw Code Status:     Code Status Orders  (From admission, onward)         Start     Ordered   04/26/18 1544  Full code  Continuous     04/26/18 1544        Code Status History    Date Active Date Inactive Code Status Order ID Comments User Context   04/10/2018 2100 04/13/2018 1656 Full Code [id number]  Toy Baker, MD Inpatient   02/21/2017 1429 02/25/2017 1455 Full Code [id number]  Michael Boston, MD Inpatient   12/03/2016 1524 12/09/2016 1712 Full Code [id number]  Debbe Odea, MD ED    Advance Directive Documentation     Most Recent Value  Type of Advance Directive  Healthcare Power of [place], Living will  Pre-existing out of facility DNR order (yellow form or pink MOST form)  -  "MOST" Form in Place?  -        IV Access:    Peripheral IV   Procedures and diagnostic studies:   Dg Chest 1 View  Result Date: 04/26/2018 CLINICAL DATA:  Post RIGHT thoracentesis EXAM: CHEST  1 VIEW COMPARISON:  Portable exam 1626 hours compared to earlier study of 04/26/2018 at 1150 hours FINDINGS: Decreased RIGHT pleural effusion and basilar atelectasis post thoracentesis. No pneumothorax. Multiple BILATERAL pulmonary nodules consistent with pulmonary metastatic disease. Stable heart size. Tortuous aorta. RIGHT jugular Port-A-Cath with tip projecting over high RIGHT atrium. Bones demineralized. IMPRESSION: No pneumothorax following RIGHT thoracentesis. Decreased RIGHT pleural effusion and basilar atelectasis. Multiple BILATERAL pulmonary metastases. Electronically Signed   By: Lavonia Dana M.D.   On: 04/26/2018 16:50   Ct Angio Chest Pe W/cm &/or Wo Cm  Result Date: 04/26/2018 CLINICAL DATA:  Two-day history of shortness of breath. Current history of rectal cancer for which the patient completed chemotherapy on 04/04/2018. 30 pound weight loss since cancer diagnosis. No os to be output over the past 3 days. Known hepatic and pulmonary metastatic disease. Recent diagnosis of pulmonary embolism 04/10/2018. EXAM:  CT ANGIOGRAPHY CHEST CT ABDOMEN AND PELVIS WITH CONTRAST TECHNIQUE: Multidetector CT imaging of the chest was performed using the standard protocol during bolus administration of intravenous contrast. Multiplanar CT image reconstructions and MIPs were obtained to evaluate the vascular anatomy.  Multidetector CT imaging of the abdomen and pelvis was performed using the standard protocol during bolus administration of intravenous contrast. CONTRAST:  16mL ISOVUE-370 IOPAMIDOL INJECTION 76% IV. COMPARISON:  04/10/2018, 01/08/2018, and earlier, including PET-CT 10/17/2017. FINDINGS: Respiratory motion blurred images throughout the examination. I do believe that the evaluation is diagnostic, however. CTA CHEST FINDINGS Cardiovascular: Occlusive filling defect within the RIGHT LOWER LOBE pulmonary artery and several of its branches as noted on the CTA chest two weeks ago. There has been no interval recanalization of any of the affected branches. No new filling defects in the pulmonary arteries elsewhere in either lung. RIGHT heart strain is again demonstrated with enlargement of the RIGHT ventricle. No visible atherosclerosis involving the thoracic aorta or the proximal great vessels. Dystrophic calcification in the aortic arch at the insertion of the ligamentum arteriosum. No evidence of thoracic aortic aneurysm. Mediastinum/Nodes: LEFT supraclavicular lymph node measuring approximately 1.3 cm short axis, unchanged since the examination 2 weeks ago. No new or enlarging lymphadenopathy in the chest. Normal appearing esophagus. Normal-appearing thyroid gland. Lungs/Pleura: Innumerable pulmonary parenchymal metastases as noted previously, the largest measuring approximately 2.2 cm in the LEFT UPPER LOBE (series 8, image 45, slightly increased in size even since the examination 2 weeks ago. Interval development of large BILATERAL pleural effusions, RIGHT greater than LEFT, with associated dense passive atelectasis in the lower lobes. Pleural metastases are visible posteriorly at the RIGHT base. Musculoskeletal: No acute findings. No evidence of osseous metastatic disease. Degenerative disc disease and spondylosis involving the LOWER cervical spine. Review of the MIP images confirms the above findings. CT ABDOMEN  and PELVIS FINDINGS Hepatobiliary: Innumerable liver metastases with marked hepatic enlargement as noted previously. Index masses were measured on the examination 2 weeks ago. Gallbladder normal in appearance without calcified gallstones. No biliary ductal dilation. Pancreas: Normal in appearance without evidence of mass, ductal dilation, or inflammation. Spleen: Normal in size and appearance. Adrenals/Urinary Tract: Normal appearing adrenal glands. Severe RIGHT hydronephrosis, unchanged, related to obstruction by a large RIGHT psoas muscle metastasis as noted previously. No evidence of LEFT hydronephrosis. Cortical cysts involving the RIGHT kidney. No visible urinary tract calculi. Stomach/Bowel: Stomach decompressed and unremarkable. Normal-appearing small bowel without evidence of obstruction. Normal-appearing descending colostomy to the LEFT of midline at the umbilical level. The descending colon is decompressed. The transverse colon is upper normal in caliber, filled with gas. Moderate stool burden in the normal caliber ascending colon and cecum. No obstructing colonic lesion is identified. No evidence of parastomal hernia. Surgically absent rectum related to the prior low AP resection. Vascular/Lymphatic: Mild aortic atherosclerosis without aneurysm. 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 Patent portal venous system. Retroperitoneal lymphadenopathy in the aortocaval and LEFT periaortic regions of the retroperitoneum as noted previously, measured on the examination 2 weeks ago. Reproductive: Prostate gland and seminal vesicles normal in size and appearance for age. Other: Multiple additional findings including: 1. Anasarca. 2. Large volume malignant ascites which has increased since the examination 2 weeks ago. 3. Large RIGHT inguinal hernia containing the cecal tip and a large amount of ascites. 4. Small LEFT inguinal hernia containing ascites. 5. Post radiation changes involving the presacral space, stable.  Musculoskeletal: No acute findings. No evidence of osseous metastatic disease. Review of the MIP images  confirms the above findings. IMPRESSION: CTA Chest: 1. No change in the occlusive thrombus involving the RIGHT LOWER LOBE pulmonary artery and several of its branches when compared to the prior CT 2 weeks ago. 2. No new pulmonary emboli elsewhere. 3. No change in the appearance of RIGHT heart strain with enlargement of the RIGHT ventricle. 4. New large BILATERAL pleural effusions, RIGHT greater than LEFT, likely malignant effusions. 5. Associated passive atelectasis in the lower lobes. 6. Innumerable lung metastases as noted previously, the largest in the [place] which has actually increased slightly in size since the examination 2 weeks ago. 7. Stable LEFT supraclavicular lymph node. No evidence of lymphadenopathy elsewhere in the chest. CT Abdomen Pelvis: 1. Thrombus involving the infrarenal IVC. 2. Satisfactory appearance to the descending colostomy. The descending colon is decompressed. There is no evidence of an obstructing colonic lesion. 3. Multiple large hepatic metastases as noted previously, accounting for hepatomegaly. 4. Large amount of malignant ascites, increased in amount since the CT 2 weeks ago. 5. Large necrotic metastasis involving the RIGHT psoas muscle as noted previously. 6. Stable severe RIGHT hydronephrosis related to obstruction by the RIGHT psoas muscle metastasis. 7. Retroperitoneal nodal metastases in the aortocaval and LEFT periaortic regions as noted previously. 8. Large RIGHT inguinal hernia containing the cecal tip and ascites. Small LEFT inguinal hernia containing ascites. 9. Post surgical and post radiation changes in the presacral space as noted previously. 10. Anasarca. Electronically Signed   By: Evangeline Dakin M.D.   On: 04/26/2018 13:55   Ct Abdomen Pelvis W Contrast  Result Date: 04/26/2018 CLINICAL DATA:  Two-day history of shortness of breath. Current history  of rectal cancer for which the patient completed chemotherapy on 04/04/2018. 30 pound weight loss since cancer diagnosis. No os to be output over the past 3 days. Known hepatic and pulmonary metastatic disease. Recent diagnosis of pulmonary embolism 04/10/2018. EXAM: CT ANGIOGRAPHY CHEST CT ABDOMEN AND PELVIS WITH CONTRAST TECHNIQUE: Multidetector CT imaging of the chest was performed using the standard protocol during bolus administration of intravenous contrast. Multiplanar CT image reconstructions and MIPs were obtained to evaluate the vascular anatomy. Multidetector CT imaging of the abdomen and pelvis was performed using the standard protocol during bolus administration of intravenous contrast. CONTRAST:  175mL ISOVUE-370 IOPAMIDOL INJECTION 76% IV. COMPARISON:  04/10/2018, 01/08/2018, and earlier, including PET-CT 10/17/2017. FINDINGS: Respiratory motion blurred images throughout the examination. I do believe that the evaluation is diagnostic, however. CTA CHEST FINDINGS Cardiovascular: Occlusive filling defect within the RIGHT LOWER LOBE pulmonary artery and several of its branches as noted on the CTA chest two weeks ago. There has been no interval recanalization of any of the affected branches. No new filling defects in the pulmonary arteries elsewhere in either lung. RIGHT heart strain is again demonstrated with enlargement of the RIGHT ventricle. No visible atherosclerosis involving the thoracic aorta or the proximal great vessels. Dystrophic calcification in the aortic arch at the insertion of the ligamentum arteriosum. No evidence of thoracic aortic aneurysm. Mediastinum/Nodes: LEFT supraclavicular lymph node measuring approximately 1.3 cm short axis, unchanged since the examination 2 weeks ago. No new or enlarging lymphadenopathy in the chest. Normal appearing esophagus. Normal-appearing thyroid gland. Lungs/Pleura: Innumerable pulmonary parenchymal metastases as noted previously, the largest measuring  approximately 2.2 cm in the LEFT UPPER LOBE (series 8, image 45, slightly increased in size even since the examination 2 weeks ago. Interval development of large BILATERAL pleural effusions, RIGHT greater than LEFT, with associated dense passive atelectasis in  the lower lobes. Pleural metastases are visible posteriorly at the RIGHT base. Musculoskeletal: No acute findings. No evidence of osseous metastatic disease. Degenerative disc disease and spondylosis involving the LOWER cervical spine. Review of the MIP images confirms the above findings. CT ABDOMEN and PELVIS FINDINGS Hepatobiliary: Innumerable liver metastases with marked hepatic enlargement as noted previously. Index masses were measured on the examination 2 weeks ago. Gallbladder normal in appearance without calcified gallstones. No biliary ductal dilation. Pancreas: Normal in appearance without evidence of mass, ductal dilation, or inflammation. Spleen: Normal in size and appearance. Adrenals/Urinary Tract: Normal appearing adrenal glands. Severe RIGHT hydronephrosis, unchanged, related to obstruction by a large RIGHT psoas muscle metastasis as noted previously. No evidence of LEFT hydronephrosis. Cortical cysts involving the RIGHT kidney. No visible urinary tract calculi. Stomach/Bowel: Stomach decompressed and unremarkable. Normal-appearing small bowel without evidence of obstruction. Normal-appearing descending colostomy to the LEFT of midline at the umbilical level. The descending colon is decompressed. The transverse colon is upper normal in caliber, filled with gas. Moderate stool burden in the normal caliber ascending colon and cecum. No obstructing colonic lesion is identified. No evidence of parastomal hernia. Surgically absent rectum related to the prior low AP resection. Vascular/Lymphatic: Mild aortic atherosclerosis without aneurysm. 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 Patent portal venous system. Retroperitoneal lymphadenopathy in the aortocaval  and LEFT periaortic regions of the retroperitoneum as noted previously, measured on the examination 2 weeks ago. Reproductive: Prostate gland and seminal vesicles normal in size and appearance for age. Other: Multiple additional findings including: 1. Anasarca. 2. Large volume malignant ascites which has increased since the examination 2 weeks ago. 3. Large RIGHT inguinal hernia containing the cecal tip and a large amount of ascites. 4. Small LEFT inguinal hernia containing ascites. 5. Post radiation changes involving the presacral space, stable. Musculoskeletal: No acute findings. No evidence of osseous metastatic disease. Review of the MIP images confirms the above findings. IMPRESSION: CTA Chest: 1. No change in the occlusive thrombus involving the RIGHT LOWER LOBE pulmonary artery and several of its branches when compared to the prior CT 2 weeks ago. 2. No new pulmonary emboli elsewhere. 3. No change in the appearance of RIGHT heart strain with enlargement of the RIGHT ventricle. 4. New large BILATERAL pleural effusions, RIGHT greater than LEFT, likely malignant effusions. 5. Associated passive atelectasis in the lower lobes. 6. Innumerable lung metastases as noted previously, the largest in the [place] which has actually increased slightly in size since the examination 2 weeks ago. 7. Stable LEFT supraclavicular lymph node. No evidence of lymphadenopathy elsewhere in the chest. CT Abdomen Pelvis: 1. Thrombus involving the infrarenal IVC. 2. Satisfactory appearance to the descending colostomy. The descending colon is decompressed. There is no evidence of an obstructing colonic lesion. 3. Multiple large hepatic metastases as noted previously, accounting for hepatomegaly. 4. Large amount of malignant ascites, increased in amount since the CT 2 weeks ago. 5. Large necrotic metastasis involving the RIGHT psoas muscle as noted previously. 6. Stable severe RIGHT hydronephrosis related to obstruction by the  RIGHT psoas muscle metastasis. 7. Retroperitoneal nodal metastases in the aortocaval and LEFT periaortic regions as noted previously. 8. Large RIGHT inguinal hernia containing the cecal tip and ascites. Small LEFT inguinal hernia containing ascites. 9. Post surgical and post radiation changes in the presacral space as noted previously. 10. Anasarca. Electronically Signed   By: Evangeline Dakin M.D.   On: 04/26/2018 13:55   US Abdomen Limited  Result Date: 04/26/2018 CLINICAL DATA:  62 year-old with metastatic disease. Evaluate for ascites. EXAM: LIMITED ABDOMEN ULTRASOUND FOR ASCITES TECHNIQUE: Limited ultrasound survey for ascites was performed in all four abdominal quadrants. COMPARISON:  CT 04/26/2018 FINDINGS: Small amount ascites in the right lower quadrant. Hepatic lesions identified in the right upper abdomen. Mass in the left upper quadrant is likely related to the liver. No significant fluid identified on the left side of the abdomen. IMPRESSION: Small amount of ascites identified in the abdomen. Paracentesis not performed. Large metastatic liver lesions. Electronically Signed   By: Markus Daft M.D.   On: 04/26/2018 16:29   Dg Abd Acute W/chest  Result Date: 04/26/2018 CLINICAL DATA:  Abdominal pain and swelling, history of PE and colon and liver carcinoma EXAM: DG ABDOMEN ACUTE W/ 1V CHEST COMPARISON:  CT from 04/10/2018 FINDINGS: Cardiac shadow is within normal limits. Right chest wall port is seen. Scattered pulmonary nodules are noted consistent with metastatic disease. Small right pleural effusion is noted with basilar changes consistent with atelectasis superimposed over the pulmonary metastatic disease. No pneumothorax is noted. Ostomy is noted in the left mid abdomen. Soft tissue fullness in the upper abdomen is noted consistent with the known history of liver metastatic disease. No free air is seen. No obstructive changes are noted. IMPRESSION: Changes consistent with known hepatic and  pulmonary metastatic disease. Associated right-sided pleural effusion and atelectasis is noted. Electronically Signed   By: Inez Catalina M.D.   On: 04/26/2018 12:32   US Thoracentesis Asp Pleural Space W/img Guide  Result Date: 04/26/2018 INDICATION: Metastatic lung cancer. Shortness of breath. Right-sided pleural effusion. Request diagnostic and therapeutic thoracentesis. EXAM: ULTRASOUND GUIDED RIGHT THORACENTESIS MEDICATIONS: None. COMPLICATIONS: None immediate. PROCEDURE: An ultrasound guided thoracentesis was thoroughly discussed with the patient and questions answered. The benefits, risks, alternatives and complications were also discussed. The patient understands and wishes to proceed with the procedure. Written consent was obtained. Ultrasound was performed to localize and mark an adequate pocket of fluid in the right chest. The area was then prepped and draped in the normal sterile fashion. 1% Lidocaine was used for local anesthesia. Under ultrasound guidance a 6 Fr Safe-T-Centesis catheter was introduced. Thoracentesis was performed. The catheter was removed and a dressing applied. FINDINGS: A total of approximately 850 mL of dark, bloody fluid was removed. Samples were sent to the laboratory as requested by the clinical team. IMPRESSION: Successful ultrasound guided right thoracentesis yielding 850 mL of pleural fluid. Read by: Ascencion Dike PA-C Electronically Signed   By: Markus Daft M.D.   On: 04/26/2018 16:16     Medical Consultants:    None.  Anti-Infectives:   *None  Subjective:    David Clarke breathing move comfortable  Objective:    Vitals:   04/26/18 1547 04/26/18 1557 04/26/18 2043 04/27/18 0434  BP: (!) 119/98 113/90 104/76 107/79  Pulse:   (!) 107 99  Resp:   20 18  Temp:   97.6 F (36.4 C) 98.3 F (36.8 C)  TempSrc:   Oral Oral  SpO2:   96% 97%    Intake/Output Summary (Last 24 hours) at 04/27/2018 2706 Last data filed at 04/27/2018 0433 Gross per 24 hour    Intake 60 ml  Output 200 ml  Net -140 ml   There were no vitals filed for this visit.  Exam: General exam: In no acute distress. Respiratory system: Good air movement and clear to auscultation. Cardiovascular system: S1 & S2 heard, RRR.  Gastrointestinal system: Abdomen is nondistended, soft  and nontender.  Central nervous system: Alert and oriented. No focal neurological deficits. Extremities: No pedal edema. Skin: No rashes, lesions or ulcers Psychiatry: Judgement and insight appear normal. Mood & affect appropriate.    Data Reviewed:    Labs: Basic Metabolic Panel: Recent Labs  Lab 04/23/18 1435 04/26/18 1116 04/27/18 0431  NA 132* 135 135  K 4.0 4.9 4.9  CL 99 97* 101  CO2 19* 20* 21*  GLUCOSE 99 117* 111*  BUN 20 25* 35*  CREATININE 0.83 1.16 1.35*  CALCIUM 8.5* 9.0 8.5*   GFR Estimated Creatinine Clearance: 45.4 mL/min (A) (by C-G formula based on SCr of 1.35 mg/dL (H)). Liver Function Tests: Recent Labs  Lab 04/23/18 1435 04/26/18 1116  AST 70* 110*  ALT 23 39  ALKPHOS 851* 881*  BILITOT 1.1 2.2*  PROT 5.5* 6.3*  ALBUMIN 2.3* 2.8*   No results for input(s): LIPASE, AMYLASE in the last 168 hours. No results for input(s): AMMONIA in the last 168 hours. Coagulation profile Recent Labs  Lab 04/26/18 1116  INR 4.40*    CBC: Recent Labs  Lab 04/23/18 1435 04/26/18 1116 04/27/18 0431  WBC 42.1* 37.0* 31.6*  NEUTROABS 40.8* 32.9*  --   HGB 6.6* 9.3* 6.7*  HCT 20.8* 28.8* 20.6*  MCV 97.7 94.1 94.5  PLT 321 307 251   Cardiac Enzymes: No results for input(s): CKTOTAL, CKMB, CKMBINDEX, TROPONINI in the last 168 hours. BNP (last 3 results) No results for input(s): PROBNP in the last 8760 hours. CBG: No results for input(s): GLUCAP in the last 168 hours. D-Dimer: No results for input(s): DDIMER in the last 72 hours. Hgb A1c: No results for input(s): HGBA1C in the last 72 hours. Lipid Profile: No results for input(s): CHOL, HDL, LDLCALC,  TRIG, CHOLHDL, LDLDIRECT in the last 72 hours. Thyroid function studies: No results for input(s): TSH, T4TOTAL, T3FREE, THYROIDAB in the last 72 hours.  Invalid input(s): FREET3 Anemia work up: No results for input(s): VITAMINB12, FOLATE, FERRITIN, TIBC, IRON, RETICCTPCT in the last 72 hours. Sepsis Labs: Recent Labs  Lab 04/23/18 1435 04/26/18 1116 04/27/18 0431  WBC 42.1* 37.0* 31.6*   Microbiology Recent Results (from the past 240 hour(s))  Gram stain     Status: None   Collection Time: 04/26/18  4:00 PM  Result Value Ref Range Status   Specimen Description PLEURAL  Final   Special Requests NONE  Final   Gram Stain   Final    MODERATE WBC PRESENT, PREDOMINANTLY PMN NO ORGANISMS SEEN Performed at [facility], [address], [place], [place] [address]    Report Status 04/26/2018 FINAL  Final     Medications:   . feeding supplement (ENSURE ENLIVE)  237 mL Oral BID BM  . magnesium oxide  400 mg Oral BID  . multivitamin with minerals  1 tablet Oral QODAY  . nystatin  5 mL Oral Daily  . pantoprazole  40 mg Oral Daily   Continuous Infusions:     LOS: 1 day   Charlynne Cousins  [facility] Pager [phone]  *Please refer to [email or web address], password [email or web address] to get updated schedule on who will round on this patient, as hospitalists switch teams weekly. If 7PM-7AM, please contact night-coverage at [email or web address], password [email or web address] for any overnight needs.  04/27/2018, 8:32 AM

## 2018-04-27 NOTE — Progress Notes (Signed)
Blood administration delayed d/t Palliative Care meeting in room. Pt asked RN to come back after meeting. Will administer as soon as meeting is over.

## 2018-04-27 NOTE — Care Management Note (Signed)
Case Management Note  Patient Details  Name: David Clarke MRN: [id number] Date of Birth: 21-Dec-1955  Subjective/Objective:   Metastatic rectal cancer                 Action/Plan: NCM spoke to pt, SO, David Clarke and dtr,David Clarke at bedside. Offered choice for Home Hospice. Pt and family requesting [facility]. Contacted [facility] rep with new referral.    Expected Discharge Date:           Expected Discharge Plan:  Home w Hospice Care  In-House Referral:  (P) NA  Discharge planning Services  CM Consult  Post Acute Care Choice:  Hospice Choice offered to:  Patient  DME Arranged:  [place] rolling DME Agency:  [place]:  RN [facility] Agency:  [facility]  Status of Service:  Completed, signed off  If discussed at [place] of Stay Meetings, dates discussed:    Additional Comments:  Erenest Rasher, RN 04/27/2018, 2:00 PM

## 2018-04-27 NOTE — Progress Notes (Signed)
[facility]-- Hospice and [facility]) Hospital Liaison RN Visit  Notified by Jennell Corner, of patient/family request for [facility] services at home after discharge. Chart and patient information under review by [facility] physician. Hospice eligibility pending at this time.  Spoke with patient, [name] (significant other) and Larsen (daughter) at bedside to initiate education related to hospice philosophy, services and team approach to care. Patient and family verbalized understanding of information given. Per discussion, plan is for discharge to home by [facility] on Sunday 04/28/18.  DME needs have been discussed, patient currently has no equipment in the home. Patient and family request a hospital bed with 1/2 rail, OBT and O2 at 2 lpm to be delivered to the home. [facility] has been notified and is scheduled to deliver equipment this evening between 5-8 pm. The home address has been verified and is correct in the chart. [name] is the family member to contact at [phone].  Please send signed and completed DNR form home with patient and family.  Patient will need prescriptions for discharge comfort medications.  [facility] aware of the above. Completed discharge summary will need to be faxed to [facility] at [phone] when final. Please notify [facility] when patient is ready to leave the unit at discharge. Call [phone] after hours, on weekends or holidays. [facility] contact numbers given to [facility] during this visit. Above information shared with Edwin Cap, RNCM.  Please call with hospice related questions or concerns.  Thank you for this referral.  Margaretmary Eddy, RN, BSN [facility] Liaison [phone]  [place] are on [facility].

## 2018-04-27 NOTE — Consult Note (Signed)
Palliative Care Progress Note  Reason for consult: Goals of care in light of metastatic colon cancer  Chart reviewed including nursing notes and personal review of pertinent labs and imaging.  Discussed care with Dr. Venetia Constable and bedside care team.  David Clarke is a 62 year old male with widely metastatic colon cancer with disease progression, recent DVT and PE on Xarelto admitted with worsening functional status and decreasing hemoglobin.  Oncology recommendation for hospice care.  Palliative consulted for goals of care.   I met today with patient, David Clarke, David Clarke), David Clarke, David Clarke, and family friends.  I introduced palliative care as specialized medical care for people living with serious illness. It focuses on providing relief from the symptoms and stress of a serious illness. The goal is to improve quality of life for both the patient and the family.  We discussed clinical course as well as wishes moving forward in regard to advanced directives.  Concepts specific to code status and rehospitalization discussed.  We discussed difference between a aggressive medical intervention path and a palliative, comfort focused care path.  Values and goals of care important to patient and family were attempted to be elicited.  Discussed options of home with hospice support vs residential hospice facility.  Family requested time to discuss options and to discuss with liaison from hospice.  I later returned in conjunction with Margaretmary Eddy from [facility] and we discussed with him regarding options moving forward.  - Patient reports desire to be at home.  Plan for transition home with hospice support, hopefully as soon as tomorrow.  Equipment to be delivered this evening. - He may desire transition to [facility] after being home for a couple of days.  [facility] to further determine this. - He remains a full code while in the hospital this admission.  He understands concerns but is having difficulty processing  this emotionally.  He is in agreement that once he leaves the hospital, he desires DNR status.  DNR once discharges from hospital.     For symptom management on discharge: Pain or Shortness of breath: Currently well controlled.  Recommend medications for d/c for use as needed is symptoms arise   - Recommend start Oxycodone Intensol 20m/ml; 529m(1/20m86mQ2hr sublingual prn pain or shortness of breath; Dispense 33m10m d/c.  Agitation: Currently not an issue for patient.  Recommend medications for d/c for use as needed is symptoms arise -Recommend start Ativan Intensol 2mg/47m give 0.5mg Q60msublingual prn agitation; Dispense 33mL o22mc. -Recommend start Haldol Intensol 2mg/ml;520mve 0.5mg Q2H 67mlingual prn agitation or nausea; Dispense 33mL on d20m Nausea: Currently not an issue for patient.  Recommend medications for d/c for use as needed is symptoms arise -Recommend start Haldol Intensol 2mg/ml; gi48m0.5mg Q2H sub39mgual prn agitation or nausea; Dispense 33mL on d/c.320mcretions: Currently not an issue for patient.  Recommend medications for d/c for use as needed if symptoms arise. -Recommend Atropine 1% eye drops (2 drops) sublingual Q4H sublingual prn secretions; Dispense 5ml on d/c.  53mel regimen:  With narcotic usage, recommend bowel regimen on d/c. -Recommend Senna 1-2 tabs PO BID on d/c.  Questions and concerns addressed.   PMT will continue to support holistically.  Total time: 90 minutes Greater than 50%  of this time was spent counseling and coordinating care related to the above assessment and plan.  Maxcine Strong, Micheline Roughth PaCoal [phone]

## 2018-04-28 DIAGNOSIS — R0600 Dyspnea, unspecified: Secondary | ICD-10-CM

## 2018-04-28 DIAGNOSIS — I2699 Other pulmonary embolism without acute cor pulmonale: Secondary | ICD-10-CM

## 2018-04-28 LAB — PH, BODY FLUID: pH, Body Fluid: 7.2

## 2018-04-28 MED ORDER — HEPARIN SOD (PORK) LOCK FLUSH 100 UNIT/ML IV SOLN
500.0000 [IU] | INTRAVENOUS | Status: AC | PRN
  Administered 2018-04-28: 500 [IU]

## 2018-04-28 MED ORDER — HALOPERIDOL LACTATE 2 MG/ML PO CONC: 1.0000 mg | ORAL | 0 refills | Status: AC | PRN

## 2018-04-28 MED ORDER — NYSTATIN 100000 UNIT/ML MT SUSP: 5.0000 mL | Freq: Every day | OROMUCOSAL | 0 refills | Status: AC

## 2018-04-28 MED ORDER — LORAZEPAM 2 MG/ML PO CONC: 1.0000 mg | Freq: Four times a day (QID) | ORAL | 0 refills | Status: AC | PRN

## 2018-04-28 MED ORDER — SODIUM CHLORIDE 0.9 % IV BOLUS: 1000.0000 mL | Freq: Once | INTRAVENOUS | Status: DC

## 2018-04-28 MED ORDER — OXYCODONE HCL 5 MG/5ML PO SOLN: 2.5000 mg | ORAL | 0 refills | Status: AC | PRN

## 2018-04-28 MED ORDER — SODIUM CHLORIDE 0.9 % IV SOLN: INTRAVENOUS | Status: DC

## 2018-04-28 MED ORDER — SODIUM CHLORIDE 0.9 % IV BOLUS
1000.0000 mL | Freq: Once | INTRAVENOUS | Status: AC
  Administered 2018-04-28: 1000 mL via INTRAVENOUS

## 2018-04-28 NOTE — Progress Notes (Signed)
David Clarke to be D/C'd Home per MD order.  Discussed prescriptions and follow up appointments with the patient. Prescriptions given to patient, medication list explained in detail. Pt verbalized understanding.  Allergies as of 04/28/2018      Reactions   Sulfa Antibiotics Nausea Only, Other (See Comments)   Reaction may years ago, pt thinks nausea only.       Medication List    STOP taking these medications   clindamycin 1 % gel Commonly known as:  CLINDAGEL   ibuprofen 200 MG tablet Commonly known as:  ADVIL,MOTRIN   KLOR-CON M20 20 MEQ tablet Generic drug:  potassium chloride SA   magnesium oxide 400 (241.3 Mg) MG tablet Commonly known as:  MAG-OX   multivitamin with minerals Tabs tablet   omeprazole 40 MG capsule Commonly known as:  PRILOSEC   Rivaroxaban 15 & 20 MG Tbpk   rivaroxaban 20 MG Tabs tablet Commonly known as:  XARELTO   THC FREE PO     TAKE these medications   haloperidol 2 MG/ML solution Commonly known as:  HALDOL Take 0.5 mLs (1 mg total) by mouth every 2 (two) hours as needed for agitation (or nausea).   LORazepam 2 MG/ML concentrated solution Commonly known as:  ATIVAN Take 0.5 mLs (1 mg total) by mouth every 6 (six) hours as needed for anxiety.   nystatin 100000 UNIT/ML suspension Commonly known as:  MYCOSTATIN Take 5 mLs (500,000 Units total) by mouth daily. What changed:  when to take this   oxyCODONE 5 MG/5ML solution Commonly known as:  ROXICODONE Take 2.5-5 mLs (2.5-5 mg total) by mouth every 2 (two) hours as needed for up to 7 days for moderate pain (or shortness of breath).            Durable Medical Equipment  (From admission, onward)         Start     Ordered   04/28/18 0000  For home use only DME oxygen    Question Answer Comment  Mode or (Route) Nasal cannula   Liters per Minute 2   Oxygen delivery system Gas      04/28/18 0849          Vitals:   04/28/18 0049 04/28/18 0502  BP: 129/85 (!) 123/93  Pulse: 96  87  Resp: 17 18  Temp: 98.3 F (36.8 C) 97.7 F (36.5 C)  SpO2:  95%    Skin clean, dry and intact without evidence of skin break down, no evidence of skin tears noted. IV catheter discontinued intact. Site without signs and symptoms of complications. Dressing and pressure applied. Pt denies pain at this time. No complaints noted.  An After Visit Summary was printed and given to the patient. Patient escorted via [place], and D/C home via ptar. Haywood Lasso BSN, RN [employer] Phone [phone]

## 2018-04-28 NOTE — Discharge Summary (Signed)
Physician Discharge Summary  David Clarke:[id number] DOB: 09/22/1955 DOA: 04/26/2018  PCP: Truitt Merle, MD  Admit date: 04/26/2018 Discharge date: 04/28/2018  Admitted From: Home Disposition:  Home  Recommendations for Outpatient Follow-up:  Will go home with hospice. He will go home with oxygen as he has sometimes needed intermittently.  Home Health:Hospice assistance Equipment/Devices:2 L  Discharge Condition:Stable CODE STATUS:DNR Diet recommendation: Heart Healthy   Brief/Interim Summary: 62 y.o. male past medical history of metastatic rectal carcinoma, normocytic anemia severe protein caloric malnutrition who was discharged on 817 on Xarelto for acute pulmonary embolism for the last 3 days he has been significantly short of breath with abdominal distention and ascitesstay  Discharge Diagnoses:  Principal Problem:   Dyspnea Active Problems:   Rectal adenocarcinoma (HCC)   Pulmonary embolism (HCC)   Normocytic anemia   Severe protein-calorie malnutrition (HCC)   Ascites   Pleural effusion  Acute dyspnea in the setting of new bilateral right greater than than left effusion in the setting of metastatic rectal cancer: IR was consulted to perform an ultrasound-guided thoracocentesis and remove 850 cc of dark bloody fluid cytology is pending at the time of the dictation. Oncology was consulted and deemed the patient not a candidate for chemotherapy or antineoplastic medications. Due to his extreme poor performance, they recommended comfort care which the patient agreed hospice and palliative care was consulted and he would like to go home with hospice.  Rectal adenocarcinoma with new pleural effusion: CT scan showed progressive disease oncology was consulted and recommended comfort care.  Bilateral pulmonary embolism: Xarelto was held due to the high risk of bleeding and the patient agreed with holding the Xarelto.  Normocytic anemia: On admission his hemoglobin was 6.7 he  was transfused 2 units of packed red blood cells it came up to 10. The risk and benefits of been on Xarelto were explained to the patient as he is probably might have bleeding into his thoracic cavity and he agreed to discontinue the Xarelto.  Severe protein caloric malnutrition.   Discharge Instructions  Discharge Instructions    Diet - low sodium heart healthy   Complete by:  As directed    For home use only DME oxygen   Complete by:  As directed    Mode or (Route):  Nasal cannula   Liters per Minute:  2   Oxygen delivery system:  Gas   Increase activity slowly   Complete by:  As directed      Allergies as of 04/28/2018      Reactions   Sulfa Antibiotics Nausea Only, Other (See Comments)   Reaction may years ago, pt thinks nausea only.       Medication List    STOP taking these medications   clindamycin 1 % gel Commonly known as:  CLINDAGEL   ibuprofen 200 MG tablet Commonly known as:  ADVIL,MOTRIN   KLOR-CON M20 20 MEQ tablet Generic drug:  potassium chloride SA   magnesium oxide 400 (241.3 Mg) MG tablet Commonly known as:  MAG-OX   multivitamin with minerals Tabs tablet   omeprazole 40 MG capsule Commonly known as:  PRILOSEC   Rivaroxaban 15 & 20 MG Tbpk   rivaroxaban 20 MG Tabs tablet Commonly known as:  XARELTO   THC FREE PO     TAKE these medications   LORazepam 2 MG/ML concentrated solution Commonly known as:  ATIVAN Take 0.5 mLs (1 mg total) by mouth every 6 (six) hours as needed for anxiety.  nystatin 100000 UNIT/ML suspension Commonly known as:  MYCOSTATIN Take 5 mLs (500,000 Units total) by mouth daily. What changed:  when to take this   oxyCODONE 5 MG/5ML solution Commonly known as:  ROXICODONE Take 2.5-5 mLs (2.5-5 mg total) by mouth every 2 (two) hours as needed for up to 7 days for moderate pain (or shortness of breath).            Durable Medical Equipment  (From admission, onward)         Start     Ordered   04/28/18 0000   For home use only DME oxygen    Question Answer Comment  Mode or (Route) Nasal cannula   Liters per Minute 2   Oxygen delivery system Gas      04/28/18 0849         Follow-up Information    [facility] Follow up.   Why:  Hospice Home RN-will call to arrange appointment Contact information: [place] [phone]         Allergies  Allergen Reactions  . Sulfa Antibiotics Nausea Only and Other (See Comments)    Reaction may years ago, pt thinks nausea only.     Consultations:  PMT   Procedures/Studies: Dg Chest 1 View  Result Date: 04/26/2018 CLINICAL DATA:  Post RIGHT thoracentesis EXAM: CHEST  1 VIEW COMPARISON:  Portable exam 1626 hours compared to earlier study of 04/26/2018 at 1150 hours FINDINGS: Decreased RIGHT pleural effusion and basilar atelectasis post thoracentesis. No pneumothorax. Multiple BILATERAL pulmonary nodules consistent with pulmonary metastatic disease. Stable heart size. Tortuous aorta. RIGHT jugular Port-A-Cath with tip projecting over high RIGHT atrium. Bones demineralized. IMPRESSION: No pneumothorax following RIGHT thoracentesis. Decreased RIGHT pleural effusion and basilar atelectasis. Multiple BILATERAL pulmonary metastases. Electronically Signed   By: Lavonia Dana M.D.   On: 04/26/2018 16:50   Ct Chest W Contrast  Result Date: 04/10/2018 CLINICAL DATA:  Stage IV rectal adenocarcinoma, presenting for restaging with ongoing chemotherapy. EXAM: CT CHEST, ABDOMEN, AND PELVIS WITH CONTRAST TECHNIQUE: Multidetector CT imaging of the chest, abdomen and pelvis was performed following the standard protocol during bolus administration of intravenous contrast. CONTRAST:  160mL OMNIPAQUE IOHEXOL 300 MG/ML  SOLN COMPARISON:  01/08/2018 CT chest, abdomen and pelvis. FINDINGS: CT CHEST FINDINGS Cardiovascular: Normal heart size. No significant pericardial effusion/thickening. Right internal  jugular MediPort terminates at the cavoatrial junction. Great vessels are normal in course and caliber. There is an acute pulmonary embolus to the right lower lobe involving lobar, segmental and subsegmental branches. Elevated RV/LV ratio 1.08. Mediastinum/Nodes: No discrete thyroid nodules. Unremarkable esophagus. Mildly enlarged 1.3 cm left supraclavicular node (series 2/image 6) is decreased from 2.5 cm. No axillary adenopathy. No mediastinal or hilar adenopathy. Lungs/Pleura: No pneumothorax. No pleural effusion. Numerous solid pulmonary masses and nodules throughout both lungs (greater than 30), significantly increased in size and number. For example, a 3.3 cm right middle lobe mass (series 9/image 129), increased from 1.8 cm. Basilar left lower lobe 2.3 cm nodule (series 9/image 136) is increased from 0.8 cm. Left upper lobe 1.9 cm nodule (series 9/image 49) is increased from 0.8 cm. Musculoskeletal:  No discrete focal osseous lesions in the chest. CT ABDOMEN PELVIS FINDINGS Hepatobiliary: Liver is enlarged by numerous (greater than 10) bulky heterogeneous hypoenhancing masses, significantly increased in size and number. For example, an 8.0 x 7.5 cm lateral segment left liver lobe mass (series 2/image 71), increased from 2.8 x 1.8 cm.  Inferior right liver lobe 7.8 x 4.7 cm mass (series 2/image 74), increased from 2.1 x 2.1 cm. Right liver dome 9.7 x 8.3 cm mass (series 2/image 56), increased from 1.1 x 1.0 cm. Normal gallbladder with no radiopaque cholelithiasis. No biliary ductal dilatation. Pancreas: Normal, with no mass or duct dilation. Spleen: Normal size. No mass. Adrenals/Urinary Tract: No discrete adrenal nodules. Mild-to-moderate right hydroureteronephrosis to the level of mid right lumbar ureter is mildly increased. No left hydronephrosis. Contrast nephrograms are symmetric and within normal limits. Subcentimeter hypodense renal cortical lesions in the upper right kidney are too small to characterize  and are stable. No new renal lesions. Bladder is collapsed with stable chronic mild diffuse bladder wall thickening and perivesical fat haziness. Stomach/Bowel: Normal non-distended stomach. Moderate right inguinal hernia containing multiple right pelvic small bowel loops, unchanged. No small bowel dilatation or wall thickening. Oral contrast transits to the colon. Appendix appears normal. Stable postsurgical changes from end sigmoid colostomy in the ventral left abdominal wall with no wall thickening or acute pericolonic fat stranding in the remnant large-bowel. Stable postsurgical and post treatment changes from abdominoperineal resection, with mildly heterogeneous soft tissue density in the presacral space measuring up to 3.4 cm thickness (series 2/image 113), stable. Vascular/Lymphatic: Atherosclerotic nonaneurysmal abdominal aorta. Patent portal, hepatic, splenic and renal veins. Aortocaval adenopathy measures up to 3.5 cm (series 2/image 77), decreased from 4.3 cm. No new adenopathy in the abdomen or pelvis. Reproductive: Stable top-normal size prostate. Other: No pneumoperitoneum. No ascites. No focal fluid collections. Heterogeneous hypodense right psoas muscle 11.2 x 10.6 cm mass (series 2/image 80), previously 11.8 x 11.1 cm, not appreciably changed. Musculoskeletal: No discrete osseous lesions. Mild lumbar spondylosis. IMPRESSION: 1. Acute pulmonary embolism in the right lower lobe, extending proximally to the lobar branch. 2. Positive for acute PE with CT evidence of right heart strain (RV/LV Ratio = 1.1) consistent with at least submassive (intermediate risk) PE. The presence of right heart strain has been associated with an increased risk of morbidity and mortality. 3. Overall substantial progression of metastatic disease, although there are mixed changes as detailed below. 4. Pulmonary metastases have significantly increased in size and number. 5. Liver metastases have significantly increased in size  and number. 6. Left supraclavicular and retroperitoneal adenopathy is decreased. 7. Large right psoas muscle metastasis is stable. 8. Mild to moderate right hydroureteronephrosis is mildly increased, although there is no evidence of a delayed right contrast nephrogram, suggesting that the degree of ureteral obstruction is low-grade at this time. 9. Stable right inguinal hernia containing small bowel loops without bowel complication. 10. Stable postsurgical/post-treatment changes in the presacral space from APR. 11.  Aortic Atherosclerosis (ICD10-I70.0). Critical Value/emergent results were called by telephone at the time of interpretation on 04/10/2018 at 2:43 pm to Dr. Truitt Merle , who verbally acknowledged these results. Electronically Signed   By: Ilona Sorrel M.D.   On: 04/10/2018 14:47   Ct Angio Chest Pe W/cm &/or Wo Cm  Result Date: 04/26/2018 CLINICAL DATA:  Two-day history of shortness of breath. Current history of rectal cancer for which the patient completed chemotherapy on 04/04/2018. 30 pound weight loss since cancer diagnosis. No os to be output over the past 3 days. Known hepatic and pulmonary metastatic disease. Recent diagnosis of pulmonary embolism 04/10/2018. EXAM: CT ANGIOGRAPHY CHEST CT ABDOMEN AND PELVIS WITH CONTRAST TECHNIQUE: Multidetector CT imaging of the chest was performed using the standard protocol during bolus administration of intravenous contrast. Multiplanar CT image reconstructions and MIPs  were obtained to evaluate the vascular anatomy. Multidetector CT imaging of the abdomen and pelvis was performed using the standard protocol during bolus administration of intravenous contrast. CONTRAST:  166mL ISOVUE-370 IOPAMIDOL INJECTION 76% IV. COMPARISON:  04/10/2018, 01/08/2018, and earlier, including PET-CT 10/17/2017. FINDINGS: Respiratory motion blurred images throughout the examination. I do believe that the evaluation is diagnostic, however. CTA CHEST FINDINGS Cardiovascular:  Occlusive filling defect within the RIGHT LOWER LOBE pulmonary artery and several of its branches as noted on the CTA chest two weeks ago. There has been no interval recanalization of any of the affected branches. No new filling defects in the pulmonary arteries elsewhere in either lung. RIGHT heart strain is again demonstrated with enlargement of the RIGHT ventricle. No visible atherosclerosis involving the thoracic aorta or the proximal great vessels. Dystrophic calcification in the aortic arch at the insertion of the ligamentum arteriosum. No evidence of thoracic aortic aneurysm. Mediastinum/Nodes: LEFT supraclavicular lymph node measuring approximately 1.3 cm short axis, unchanged since the examination 2 weeks ago. No new or enlarging lymphadenopathy in the chest. Normal appearing esophagus. Normal-appearing thyroid gland. Lungs/Pleura: Innumerable pulmonary parenchymal metastases as noted previously, the largest measuring approximately 2.2 cm in the LEFT UPPER LOBE (series 8, image 45, slightly increased in size even since the examination 2 weeks ago. Interval development of large BILATERAL pleural effusions, RIGHT greater than LEFT, with associated dense passive atelectasis in the lower lobes. Pleural metastases are visible posteriorly at the RIGHT base. Musculoskeletal: No acute findings. No evidence of osseous metastatic disease. Degenerative disc disease and spondylosis involving the LOWER cervical spine. Review of the MIP images confirms the above findings. CT ABDOMEN and PELVIS FINDINGS Hepatobiliary: Innumerable liver metastases with marked hepatic enlargement as noted previously. Index masses were measured on the examination 2 weeks ago. Gallbladder normal in appearance without calcified gallstones. No biliary ductal dilation. Pancreas: Normal in appearance without evidence of mass, ductal dilation, or inflammation. Spleen: Normal in size and appearance. Adrenals/Urinary Tract: Normal appearing adrenal  glands. Severe RIGHT hydronephrosis, unchanged, related to obstruction by a large RIGHT psoas muscle metastasis as noted previously. No evidence of LEFT hydronephrosis. Cortical cysts involving the RIGHT kidney. No visible urinary tract calculi. Stomach/Bowel: Stomach decompressed and unremarkable. Normal-appearing small bowel without evidence of obstruction. Normal-appearing descending colostomy to the LEFT of midline at the umbilical level. The descending colon is decompressed. The transverse colon is upper normal in caliber, filled with gas. Moderate stool burden in the normal caliber ascending colon and cecum. No obstructing colonic lesion is identified. No evidence of parastomal hernia. Surgically absent rectum related to the prior low AP resection. Vascular/Lymphatic: Mild aortic atherosclerosis without aneurysm. 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 Patent portal venous system. Retroperitoneal lymphadenopathy in the aortocaval and LEFT periaortic regions of the retroperitoneum as noted previously, measured on the examination 2 weeks ago. Reproductive: Prostate gland and seminal vesicles normal in size and appearance for age. Other: Multiple additional findings including: 1. Anasarca. 2. Large volume malignant ascites which has increased since the examination 2 weeks ago. 3. Large RIGHT inguinal hernia containing the cecal tip and a large amount of ascites. 4. Small LEFT inguinal hernia containing ascites. 5. Post radiation changes involving the presacral space, stable. Musculoskeletal: No acute findings. No evidence of osseous metastatic disease. Review of the MIP images confirms the above findings. IMPRESSION: CTA Chest: 1. No change in the occlusive thrombus involving the RIGHT LOWER LOBE pulmonary artery and several of its branches when compared to the prior CT 2 weeks ago.  2. No new pulmonary emboli elsewhere. 3. No change in the appearance of RIGHT heart strain with enlargement of the RIGHT ventricle. 4. New  large BILATERAL pleural effusions, RIGHT greater than LEFT, likely malignant effusions. 5. Associated passive atelectasis in the lower lobes. 6. Innumerable lung metastases as noted previously, the largest in the [place] which has actually increased slightly in size since the examination 2 weeks ago. 7. Stable LEFT supraclavicular lymph node. No evidence of lymphadenopathy elsewhere in the chest. CT Abdomen Pelvis: 1. Thrombus involving the infrarenal IVC. 2. Satisfactory appearance to the descending colostomy. The descending colon is decompressed. There is no evidence of an obstructing colonic lesion. 3. Multiple large hepatic metastases as noted previously, accounting for hepatomegaly. 4. Large amount of malignant ascites, increased in amount since the CT 2 weeks ago. 5. Large necrotic metastasis involving the RIGHT psoas muscle as noted previously. 6. Stable severe RIGHT hydronephrosis related to obstruction by the RIGHT psoas muscle metastasis. 7. Retroperitoneal nodal metastases in the aortocaval and LEFT periaortic regions as noted previously. 8. Large RIGHT inguinal hernia containing the cecal tip and ascites. Small LEFT inguinal hernia containing ascites. 9. Post surgical and post radiation changes in the presacral space as noted previously. 10. Anasarca. Electronically Signed   By: Evangeline Dakin M.D.   On: 04/26/2018 13:55   Ct Abdomen Pelvis W Contrast  Result Date: 04/26/2018 CLINICAL DATA:  Two-day history of shortness of breath. Current history of rectal cancer for which the patient completed chemotherapy on 04/04/2018. 30 pound weight loss since cancer diagnosis. No os to be output over the past 3 days. Known hepatic and pulmonary metastatic disease. Recent diagnosis of pulmonary embolism 04/10/2018. EXAM: CT ANGIOGRAPHY CHEST CT ABDOMEN AND PELVIS WITH CONTRAST TECHNIQUE: Multidetector CT imaging of the chest was performed using the standard protocol during bolus administration of  intravenous contrast. Multiplanar CT image reconstructions and MIPs were obtained to evaluate the vascular anatomy. Multidetector CT imaging of the abdomen and pelvis was performed using the standard protocol during bolus administration of intravenous contrast. CONTRAST:  113mL ISOVUE-370 IOPAMIDOL INJECTION 76% IV. COMPARISON:  04/10/2018, 01/08/2018, and earlier, including PET-CT 10/17/2017. FINDINGS: Respiratory motion blurred images throughout the examination. I do believe that the evaluation is diagnostic, however. CTA CHEST FINDINGS Cardiovascular: Occlusive filling defect within the RIGHT LOWER LOBE pulmonary artery and several of its branches as noted on the CTA chest two weeks ago. There has been no interval recanalization of any of the affected branches. No new filling defects in the pulmonary arteries elsewhere in either lung. RIGHT heart strain is again demonstrated with enlargement of the RIGHT ventricle. No visible atherosclerosis involving the thoracic aorta or the proximal great vessels. Dystrophic calcification in the aortic arch at the insertion of the ligamentum arteriosum. No evidence of thoracic aortic aneurysm. Mediastinum/Nodes: LEFT supraclavicular lymph node measuring approximately 1.3 cm short axis, unchanged since the examination 2 weeks ago. No new or enlarging lymphadenopathy in the chest. Normal appearing esophagus. Normal-appearing thyroid gland. Lungs/Pleura: Innumerable pulmonary parenchymal metastases as noted previously, the largest measuring approximately 2.2 cm in the LEFT UPPER LOBE (series 8, image 45, slightly increased in size even since the examination 2 weeks ago. Interval development of large BILATERAL pleural effusions, RIGHT greater than LEFT, with associated dense passive atelectasis in the lower lobes. Pleural metastases are visible posteriorly at the RIGHT base. Musculoskeletal: No acute findings. No evidence of osseous metastatic disease. Degenerative disc disease  and spondylosis involving the LOWER cervical spine. Review of  the MIP images confirms the above findings. CT ABDOMEN and PELVIS FINDINGS Hepatobiliary: Innumerable liver metastases with marked hepatic enlargement as noted previously. Index masses were measured on the examination 2 weeks ago. Gallbladder normal in appearance without calcified gallstones. No biliary ductal dilation. Pancreas: Normal in appearance without evidence of mass, ductal dilation, or inflammation. Spleen: Normal in size and appearance. Adrenals/Urinary Tract: Normal appearing adrenal glands. Severe RIGHT hydronephrosis, unchanged, related to obstruction by a large RIGHT psoas muscle metastasis as noted previously. No evidence of LEFT hydronephrosis. Cortical cysts involving the RIGHT kidney. No visible urinary tract calculi. Stomach/Bowel: Stomach decompressed and unremarkable. Normal-appearing small bowel without evidence of obstruction. Normal-appearing descending colostomy to the LEFT of midline at the umbilical level. The descending colon is decompressed. The transverse colon is upper normal in caliber, filled with gas. Moderate stool burden in the normal caliber ascending colon and cecum. No obstructing colonic lesion is identified. No evidence of parastomal hernia. Surgically absent rectum related to the prior low AP resection. Vascular/Lymphatic: Mild aortic atherosclerosis without aneurysm. 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 Patent portal venous system. Retroperitoneal lymphadenopathy in the aortocaval and LEFT periaortic regions of the retroperitoneum as noted previously, measured on the examination 2 weeks ago. Reproductive: Prostate gland and seminal vesicles normal in size and appearance for age. Other: Multiple additional findings including: 1. Anasarca. 2. Large volume malignant ascites which has increased since the examination 2 weeks ago. 3. Large RIGHT inguinal hernia containing the cecal tip and a large amount of ascites. 4.  Small LEFT inguinal hernia containing ascites. 5. Post radiation changes involving the presacral space, stable. Musculoskeletal: No acute findings. No evidence of osseous metastatic disease. Review of the MIP images confirms the above findings. IMPRESSION: CTA Chest: 1. No change in the occlusive thrombus involving the RIGHT LOWER LOBE pulmonary artery and several of its branches when compared to the prior CT 2 weeks ago. 2. No new pulmonary emboli elsewhere. 3. No change in the appearance of RIGHT heart strain with enlargement of the RIGHT ventricle. 4. New large BILATERAL pleural effusions, RIGHT greater than LEFT, likely malignant effusions. 5. Associated passive atelectasis in the lower lobes. 6. Innumerable lung metastases as noted previously, the largest in the [place] which has actually increased slightly in size since the examination 2 weeks ago. 7. Stable LEFT supraclavicular lymph node. No evidence of lymphadenopathy elsewhere in the chest. CT Abdomen Pelvis: 1. Thrombus involving the infrarenal IVC. 2. Satisfactory appearance to the descending colostomy. The descending colon is decompressed. There is no evidence of an obstructing colonic lesion. 3. Multiple large hepatic metastases as noted previously, accounting for hepatomegaly. 4. Large amount of malignant ascites, increased in amount since the CT 2 weeks ago. 5. Large necrotic metastasis involving the RIGHT psoas muscle as noted previously. 6. Stable severe RIGHT hydronephrosis related to obstruction by the RIGHT psoas muscle metastasis. 7. Retroperitoneal nodal metastases in the aortocaval and LEFT periaortic regions as noted previously. 8. Large RIGHT inguinal hernia containing the cecal tip and ascites. Small LEFT inguinal hernia containing ascites. 9. Post surgical and post radiation changes in the presacral space as noted previously. 10. Anasarca. Electronically Signed   By: Evangeline Dakin M.D.   On: 04/26/2018 13:55   Ct Abdomen  Pelvis W Contrast  Result Date: 04/10/2018 CLINICAL DATA:  Stage IV rectal adenocarcinoma, presenting for restaging with ongoing chemotherapy. EXAM: CT CHEST, ABDOMEN, AND PELVIS WITH CONTRAST TECHNIQUE: Multidetector CT imaging of the chest, abdomen and pelvis was performed following the  standard protocol during bolus administration of intravenous contrast. CONTRAST:  161mL OMNIPAQUE IOHEXOL 300 MG/ML  SOLN COMPARISON:  01/08/2018 CT chest, abdomen and pelvis. FINDINGS: CT CHEST FINDINGS Cardiovascular: Normal heart size. No significant pericardial effusion/thickening. Right internal jugular MediPort terminates at the cavoatrial junction. Great vessels are normal in course and caliber. There is an acute pulmonary embolus to the right lower lobe involving lobar, segmental and subsegmental branches. Elevated RV/LV ratio 1.08. Mediastinum/Nodes: No discrete thyroid nodules. Unremarkable esophagus. Mildly enlarged 1.3 cm left supraclavicular node (series 2/image 6) is decreased from 2.5 cm. No axillary adenopathy. No mediastinal or hilar adenopathy. Lungs/Pleura: No pneumothorax. No pleural effusion. Numerous solid pulmonary masses and nodules throughout both lungs (greater than 30), significantly increased in size and number. For example, a 3.3 cm right middle lobe mass (series 9/image 129), increased from 1.8 cm. Basilar left lower lobe 2.3 cm nodule (series 9/image 136) is increased from 0.8 cm. Left upper lobe 1.9 cm nodule (series 9/image 49) is increased from 0.8 cm. Musculoskeletal:  No discrete focal osseous lesions in the chest. CT ABDOMEN PELVIS FINDINGS Hepatobiliary: Liver is enlarged by numerous (greater than 10) bulky heterogeneous hypoenhancing masses, significantly increased in size and number. For example, an 8.0 x 7.5 cm lateral segment left liver lobe mass (series 2/image 71), increased from 2.8 x 1.8 cm. Inferior right liver lobe 7.8 x 4.7 cm mass (series 2/image 74), increased from 2.1 x 2.1 cm.  Right liver dome 9.7 x 8.3 cm mass (series 2/image 56), increased from 1.1 x 1.0 cm. Normal gallbladder with no radiopaque cholelithiasis. No biliary ductal dilatation. Pancreas: Normal, with no mass or duct dilation. Spleen: Normal size. No mass. Adrenals/Urinary Tract: No discrete adrenal nodules. Mild-to-moderate right hydroureteronephrosis to the level of mid right lumbar ureter is mildly increased. No left hydronephrosis. Contrast nephrograms are symmetric and within normal limits. Subcentimeter hypodense renal cortical lesions in the upper right kidney are too small to characterize and are stable. No new renal lesions. Bladder is collapsed with stable chronic mild diffuse bladder wall thickening and perivesical fat haziness. Stomach/Bowel: Normal non-distended stomach. Moderate right inguinal hernia containing multiple right pelvic small bowel loops, unchanged. No small bowel dilatation or wall thickening. Oral contrast transits to the colon. Appendix appears normal. Stable postsurgical changes from end sigmoid colostomy in the ventral left abdominal wall with no wall thickening or acute pericolonic fat stranding in the remnant large-bowel. Stable postsurgical and post treatment changes from abdominoperineal resection, with mildly heterogeneous soft tissue density in the presacral space measuring up to 3.4 cm thickness (series 2/image 113), stable. Vascular/Lymphatic: Atherosclerotic nonaneurysmal abdominal aorta. Patent portal, hepatic, splenic and renal veins. Aortocaval adenopathy measures up to 3.5 cm (series 2/image 77), decreased from 4.3 cm. No new adenopathy in the abdomen or pelvis. Reproductive: Stable top-normal size prostate. Other: No pneumoperitoneum. No ascites. No focal fluid collections. Heterogeneous hypodense right psoas muscle 11.2 x 10.6 cm mass (series 2/image 80), previously 11.8 x 11.1 cm, not appreciably changed. Musculoskeletal: No discrete osseous lesions. Mild lumbar spondylosis.  IMPRESSION: 1. Acute pulmonary embolism in the right lower lobe, extending proximally to the lobar branch. 2. Positive for acute PE with CT evidence of right heart strain (RV/LV Ratio = 1.1) consistent with at least submassive (intermediate risk) PE. The presence of right heart strain has been associated with an increased risk of morbidity and mortality. 3. Overall substantial progression of metastatic disease, although there are mixed changes as detailed below. 4. Pulmonary metastases have significantly increased in  size and number. 5. Liver metastases have significantly increased in size and number. 6. Left supraclavicular and retroperitoneal adenopathy is decreased. 7. Large right psoas muscle metastasis is stable. 8. Mild to moderate right hydroureteronephrosis is mildly increased, although there is no evidence of a delayed right contrast nephrogram, suggesting that the degree of ureteral obstruction is low-grade at this time. 9. Stable right inguinal hernia containing small bowel loops without bowel complication. 10. Stable postsurgical/post-treatment changes in the presacral space from APR. 11.  Aortic Atherosclerosis (ICD10-I70.0). Critical Value/emergent results were called by telephone at the time of interpretation on 04/10/2018 at 2:43 pm to Dr. Truitt Merle , who verbally acknowledged these results. Electronically Signed   By: Ilona Sorrel M.D.   On: 04/10/2018 14:47   US Abdomen Limited  Result Date: 04/26/2018 CLINICAL DATA:  62 year-old with metastatic disease. Evaluate for ascites. EXAM: LIMITED ABDOMEN ULTRASOUND FOR ASCITES TECHNIQUE: Limited ultrasound survey for ascites was performed in all four abdominal quadrants. COMPARISON:  CT 04/26/2018 FINDINGS: Small amount ascites in the right lower quadrant. Hepatic lesions identified in the right upper abdomen. Mass in the left upper quadrant is likely related to the liver. No significant fluid identified on the left side of the abdomen. IMPRESSION:  Small amount of ascites identified in the abdomen. Paracentesis not performed. Large metastatic liver lesions. Electronically Signed   By: Markus Daft M.D.   On: 04/26/2018 16:29   Dg Abd Acute W/chest  Result Date: 04/26/2018 CLINICAL DATA:  Abdominal pain and swelling, history of PE and colon and liver carcinoma EXAM: DG ABDOMEN ACUTE W/ 1V CHEST COMPARISON:  CT from 04/10/2018 FINDINGS: Cardiac shadow is within normal limits. Right chest wall port is seen. Scattered pulmonary nodules are noted consistent with metastatic disease. Small right pleural effusion is noted with basilar changes consistent with atelectasis superimposed over the pulmonary metastatic disease. No pneumothorax is noted. Ostomy is noted in the left mid abdomen. Soft tissue fullness in the upper abdomen is noted consistent with the known history of liver metastatic disease. No free air is seen. No obstructive changes are noted. IMPRESSION: Changes consistent with known hepatic and pulmonary metastatic disease. Associated right-sided pleural effusion and atelectasis is noted. Electronically Signed   By: Inez Catalina M.D.   On: 04/26/2018 12:32   US Thoracentesis Asp Pleural Space W/img Guide  Result Date: 04/26/2018 INDICATION: Metastatic lung cancer. Shortness of breath. Right-sided pleural effusion. Request diagnostic and therapeutic thoracentesis. EXAM: ULTRASOUND GUIDED RIGHT THORACENTESIS MEDICATIONS: None. COMPLICATIONS: None immediate. PROCEDURE: An ultrasound guided thoracentesis was thoroughly discussed with the patient and questions answered. The benefits, risks, alternatives and complications were also discussed. The patient understands and wishes to proceed with the procedure. Written consent was obtained. Ultrasound was performed to localize and mark an adequate pocket of fluid in the right chest. The area was then prepped and draped in the normal sterile fashion. 1% Lidocaine was used for local anesthesia. Under ultrasound  guidance a 6 Fr Safe-T-Centesis catheter was introduced. Thoracentesis was performed. The catheter was removed and a dressing applied. FINDINGS: A total of approximately 850 mL of dark, bloody fluid was removed. Samples were sent to the laboratory as requested by the clinical team. IMPRESSION: Successful ultrasound guided right thoracentesis yielding 850 mL of pleural fluid. Read by: Ascencion Dike PA-C Electronically Signed   By: Markus Daft M.D.   On: 04/26/2018 16:16     Subjective: No complains  Discharge Exam: Vitals:   04/28/18 0049 04/28/18 0502  BP: 129/85 Marland Kitchen)  123/93  Pulse: 96 87  Resp: 17 18  Temp: 98.3 F (36.8 C) 97.7 F (36.5 C)  SpO2:  95%   Vitals:   04/27/18 1722 04/27/18 2001 04/28/18 0049 04/28/18 0502  BP: 119/81 (!) 125/96 129/85 (!) 123/93  Pulse: 93 98 96 87  Resp: 18  17 18   Temp: 98.1 F (36.7 C) 98 F (36.7 C) 98.3 F (36.8 C) 97.7 F (36.5 C)  TempSrc: Oral Oral Oral   SpO2: 98% 98%  95%  Height:   6' (1.829 m)     General: Pt is alert, awake, not in acute distress Cardiovascular: RRR, S1/S2 +, no rubs, no gallops Respiratory: CTA bilaterally, no wheezing, no rhonchi Abdominal: Soft, NT, ND, bowel sounds + Extremities: no edema, no cyanosis    The results of significant diagnostics from this hospitalization (including imaging, microbiology, ancillary and laboratory) are listed below for reference.     Microbiology: Recent Results (from the past 240 hour(s))  Culture, body fluid-bottle     Status: None (Preliminary result)   Collection Time: 04/26/18  4:00 PM  Result Value Ref Range Status   Specimen Description PERITONEAL  Final   Special Requests NONE  Final   Culture   Final    NO GROWTH 2 DAYS Performed at [facility], [address], [address] [place], [place] [address]    Report Status PENDING  Incomplete  Gram stain     Status: None   Collection Time: 04/26/18  4:00 PM  Result Value Ref Range Status   Specimen Description PLEURAL   Final   Special Requests NONE  Final   Gram Stain   Final    MODERATE WBC PRESENT, PREDOMINANTLY PMN NO ORGANISMS SEEN Performed at [facility], [place] [address] [place], [place] [address]    Report Status 04/26/2018 FINAL  Final     Labs: BNP (last 3 results) No results for input(s): BNP in the last 8760 hours. Basic Metabolic Panel: Recent Labs  Lab 04/23/18 1435 04/26/18 1116 04/27/18 0431  NA 132* 135 135  K 4.0 4.9 4.9  CL 99 97* 101  CO2 19* 20* 21*  GLUCOSE 99 117* 111*  BUN 20 25* 35*  CREATININE 0.83 1.16 1.35*  CALCIUM 8.5* 9.0 8.5*   Liver Function Tests: Recent Labs  Lab 04/23/18 1435 04/26/18 1116  AST 70* 110*  ALT 23 39  ALKPHOS 851* 881*  BILITOT 1.1 2.2*  PROT 5.5* 6.3*  ALBUMIN 2.3* 2.8*   No results for input(s): LIPASE, AMYLASE in the last 168 hours. No results for input(s): AMMONIA in the last 168 hours. CBC: Recent Labs  Lab 04/23/18 1435 04/26/18 1116 04/27/18 0431 04/27/18 2156  WBC 42.1* 37.0* 31.6* 33.3*  NEUTROABS 40.8* 32.9*  --   --   HGB 6.6* 9.3* 6.7* 10.2*  HCT 20.8* 28.8* 20.6* 30.8*  MCV 97.7 94.1 94.5 92.5  PLT 321 307 251 205   Cardiac Enzymes: No results for input(s): CKTOTAL, CKMB, CKMBINDEX, TROPONINI in the last 168 hours. BNP: Invalid input(s): POCBNP CBG: No results for input(s): GLUCAP in the last 168 hours. D-Dimer No results for input(s): DDIMER in the last 72 hours. Hgb A1c No results for input(s): HGBA1C in the last 72 hours. Lipid Profile No results for input(s): CHOL, HDL, LDLCALC, TRIG, CHOLHDL, LDLDIRECT in the last 72 hours. Thyroid function studies No results for input(s): TSH, T4TOTAL, T3FREE, THYROIDAB in the last 72 hours.  Invalid input(s): FREET3 Anemia work up No  results for input(s): VITAMINB12, FOLATE, FERRITIN, TIBC, IRON, RETICCTPCT in the last 72 hours. Urinalysis    Component Value Date/Time   COLORURINE YELLOW 12/07/2016 1934   APPEARANCEUR CLEAR 12/07/2016 1934    LABSPEC 1.020 12/07/2016 1934   PHURINE 5.0 12/07/2016 1934   GLUCOSEU NEGATIVE 12/07/2016 1934   HGBUR SMALL (A) 12/07/2016 1934   BILIRUBINUR NEGATIVE 12/07/2016 1934   KETONESUR NEGATIVE 12/07/2016 1934   PROTEINUR 30 (A) 12/07/2016 1934   NITRITE NEGATIVE 12/07/2016 1934   LEUKOCYTESUR NEGATIVE 12/07/2016 1934   Sepsis Labs Invalid input(s): PROCALCITONIN,  WBC,  LACTICIDVEN Microbiology Recent Results (from the past 240 hour(s))  Culture, body fluid-bottle     Status: None (Preliminary result)   Collection Time: 04/26/18  4:00 PM  Result Value Ref Range Status   Specimen Description PERITONEAL  Final   Special Requests NONE  Final   Culture   Final    NO GROWTH 2 DAYS Performed at [facility], [address] [place], [place] [address]    Report Status PENDING  Incomplete  Gram stain     Status: None   Collection Time: 04/26/18  4:00 PM  Result Value Ref Range Status   Specimen Description PLEURAL  Final   Special Requests NONE  Final   Gram Stain   Final    MODERATE WBC PRESENT, PREDOMINANTLY PMN NO ORGANISMS SEEN Performed at [facility], [place] [address] [place], [place] [address]    Report Status 04/26/2018 FINAL  Final     Time coordinating discharge: 40 minutes  SIGNED:   Charlynne Cousins, MD  [facility] 04/28/2018, 8:49 AM Pager   If 7PM-7AM, please contact night-coverage [email or web address] Password [email or web address]

## 2018-04-28 NOTE — Progress Notes (Signed)
Palliative Care Progress Notes  Reason for Consult: Goals of care in light of metastatic disease  I met today with David Clarke, his fiancee, his daughter, and family friend.  Reports had "pretty good" night overall.  States that he is happy to be going home today.  We reviewed plan for symptom management in detail.    Family reports being "as comfortable as we can be" with discharge plan.  Plan for home with hospice.  Await PTAR  Total time: 30 minutes Greater than 50%  of this time was spent counseling and coordinating care related to the above assessment and plan.  Micheline Rough, MD [place] Team [phone]

## 2018-04-28 NOTE — Progress Notes (Signed)
PTAR arranged. Jonnie Finner RN CCM Case Mgmt phone [phone]

## 2018-04-29 LAB — TYPE AND SCREEN
ABO/RH(D): O POS
ANTIBODY SCREEN: NEGATIVE
Unit division: 0
Unit division: 0

## 2018-04-29 LAB — BPAM RBC
BLOOD PRODUCT EXPIRATION DATE: 201910012359
Blood Product Expiration Date: 201910012359
ISSUE DATE / TIME: 201908311334
ISSUE DATE / TIME: 201908311652
UNIT TYPE AND RH: 5100
Unit Type and Rh: 5100

## 2018-04-30 ENCOUNTER — Inpatient Hospital Stay: Payer: BLUE CROSS/BLUE SHIELD

## 2018-04-30 ENCOUNTER — Inpatient Hospital Stay: Payer: BLUE CROSS/BLUE SHIELD | Admitting: Hematology

## 2018-05-01 LAB — CULTURE, BODY FLUID W GRAM STAIN -BOTTLE

## 2018-05-01 LAB — CULTURE, BODY FLUID-BOTTLE: CULTURE: NO GROWTH

## 2018-05-02 ENCOUNTER — Inpatient Hospital Stay: Payer: BLUE CROSS/BLUE SHIELD

## 2018-05-02 ENCOUNTER — Telehealth: Payer: Self-pay

## 2018-05-02 ENCOUNTER — Inpatient Hospital Stay: Payer: BLUE CROSS/BLUE SHIELD | Admitting: Hematology

## 2018-05-02 ENCOUNTER — Other Ambulatory Visit: Payer: BLUE CROSS/BLUE SHIELD

## 2018-05-02 ENCOUNTER — Inpatient Hospital Stay: Payer: BLUE CROSS/BLUE SHIELD | Admitting: Nutrition

## 2018-05-02 ENCOUNTER — Ambulatory Visit: Payer: BLUE CROSS/BLUE SHIELD

## 2018-05-02 ENCOUNTER — Ambulatory Visit: Payer: BLUE CROSS/BLUE SHIELD | Admitting: Hematology

## 2018-05-02 NOTE — Telephone Encounter (Signed)
Faxed signed orders to [facility]

## 2018-05-16 ENCOUNTER — Ambulatory Visit: Payer: BLUE CROSS/BLUE SHIELD

## 2018-05-16 ENCOUNTER — Other Ambulatory Visit: Payer: BLUE CROSS/BLUE SHIELD

## 2018-05-16 ENCOUNTER — Ambulatory Visit: Payer: BLUE CROSS/BLUE SHIELD | Admitting: Nurse Practitioner

## 2018-05-28 DEATH — deceased

## 2018-10-22 IMAGING — CT CT ABD-PELV W/ CM
4 of 11 series · 9 of 46 positions shown, 14 images · IV contrast (ISOVUE)
Comparison: 04/10/2018, 01/08/2018, and earlier, including PET-CT
10/17/2017.

CLINICAL DATA: Two-day history of shortness of breath. Current
history of rectal cancer for which the patient completed
chemotherapy on 04/04/2018. 30 pound weight loss since cancer
diagnosis. No os to be output over the past 3 days. Known hepatic
and pulmonary metastatic disease. Recent diagnosis of pulmonary
embolism 04/10/2018.

EXAM:
CT ANGIOGRAPHY CHEST
CT ABDOMEN AND PELVIS WITH CONTRAST
TECHNIQUE: Multidetector CT imaging of the chest was performed using the
standard protocol during bolus administration of intravenous
contrast. Multiplanar CT image reconstructions and MIPs were
obtained to evaluate the vascular anatomy. Multidetector CT imaging
of the abdomen and pelvis was performed using the standard protocol
during bolus administration of intravenous contrast.
CONTRAST:  100mL [id number] IOPAMIDOL INJECTION 76% IV.

[Series 4: axial st · axial · 0.81mm/px · z∈[-615,-355]mm · 3 of 104 slices shown, 7 images]
[im 26/104  soft-tissue]
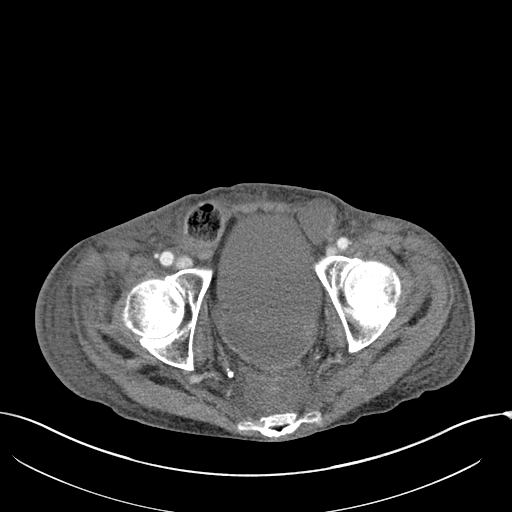
[im 26/104  lung]
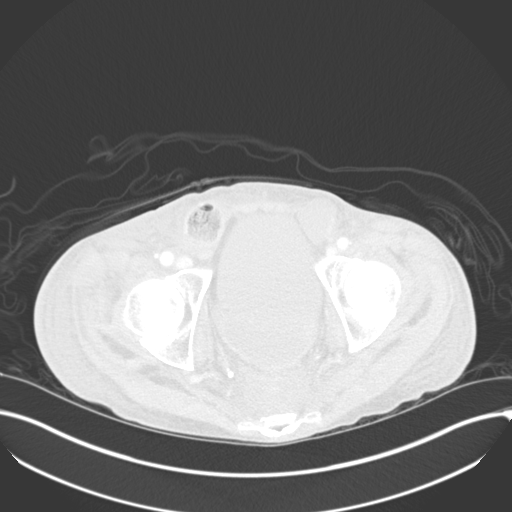
[im 26/104  bone]
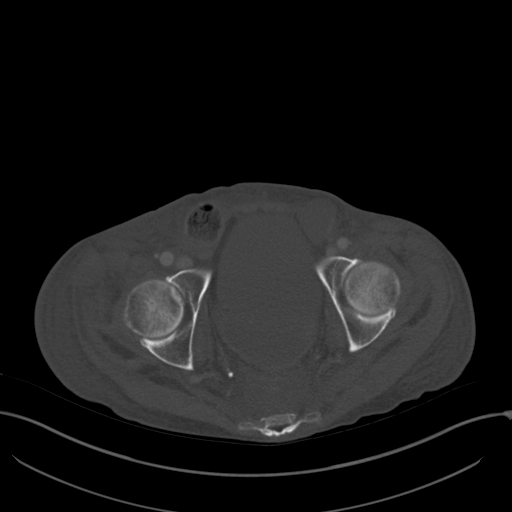
[im 52/104  soft-tissue]
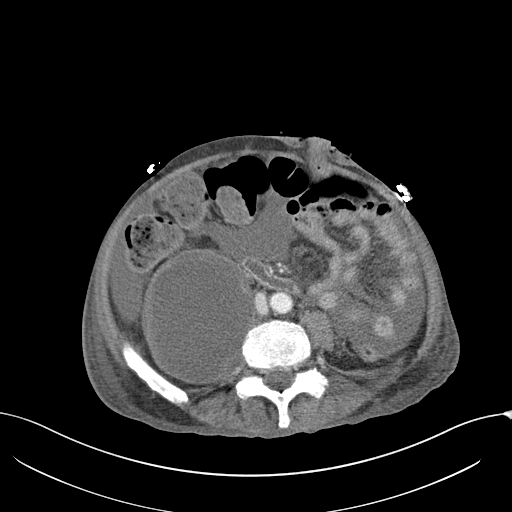
[im 52/104  lung]
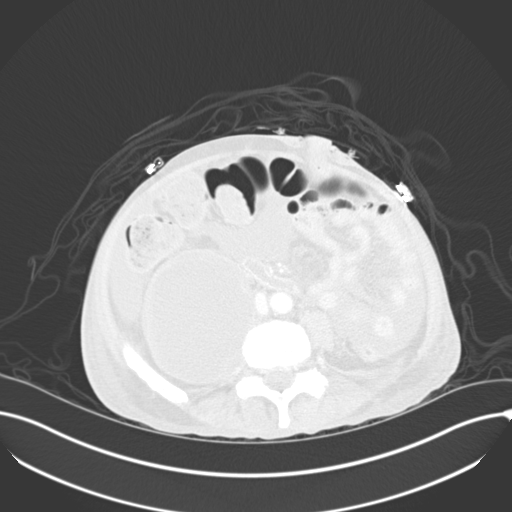
[im 78/104  soft-tissue]
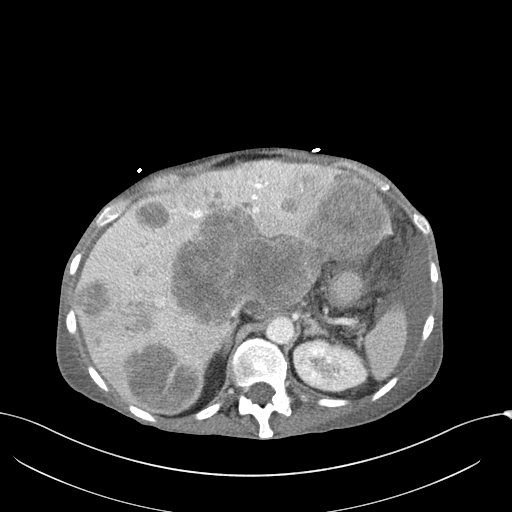
[im 78/104  lung]
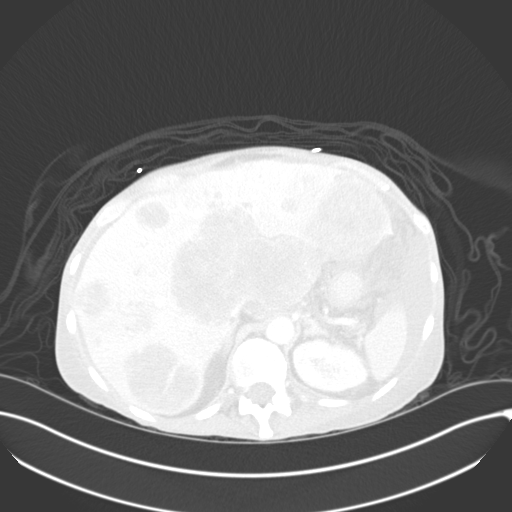

[Series 7: thins · axial · 0.79mm/px · z∈[-321,-277]mm · 2 of 283 slices shown]
[im 22/283  soft-tissue]
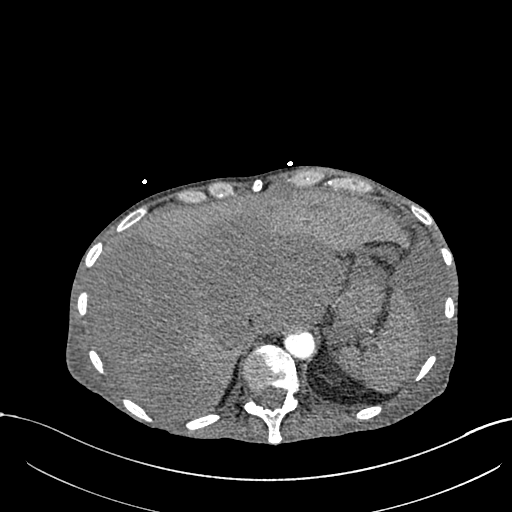
[im 66/283  soft-tissue]
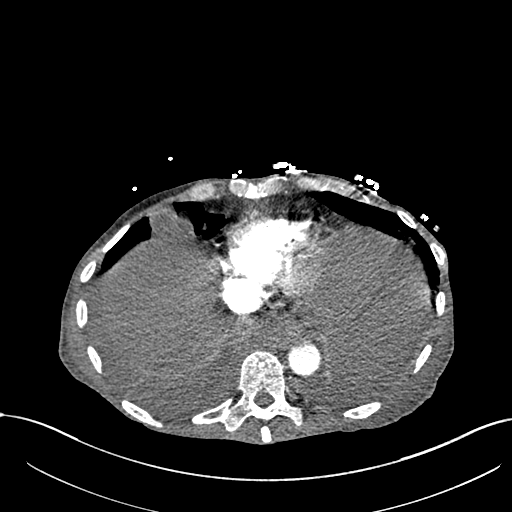

[Series 10: coronal mpr · coronal · 0.57mm/px · 2 of 151 slices shown, 3 images]
[im 51/151  soft-tissue]
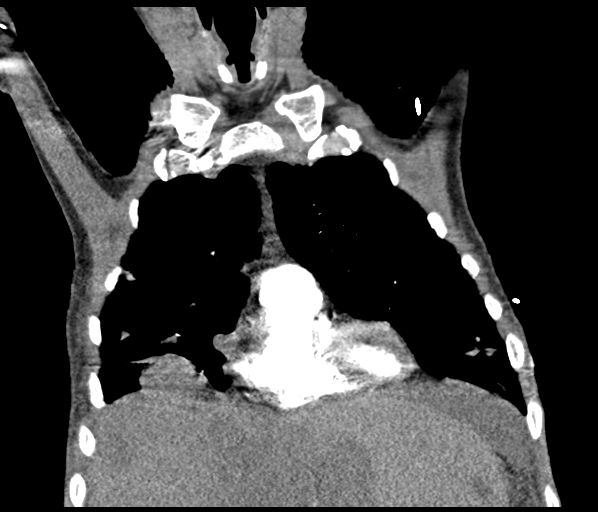
[im 51/151  bone]
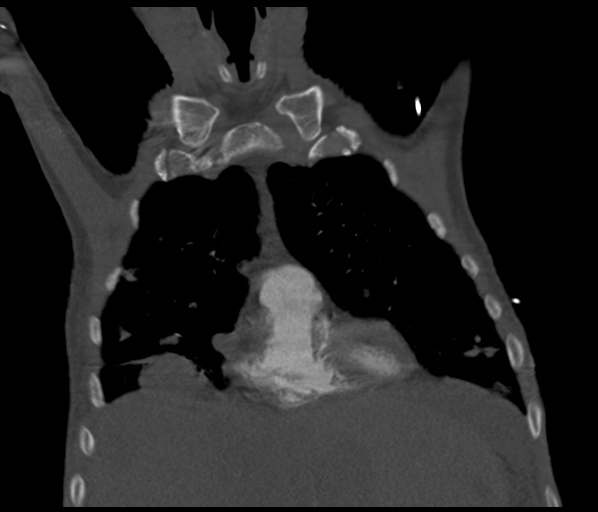
[im 101/151  soft-tissue]
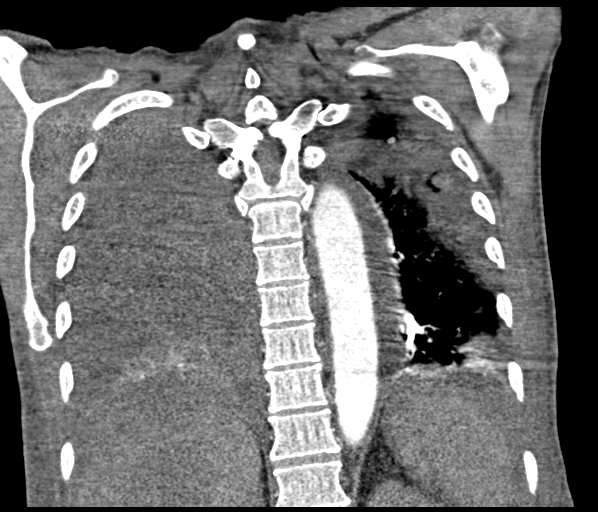

[Series 16: delays · axial · 0.82mm/px · z∈[-521,-386]mm · 2 of 82 slices shown]
[im 28/82  soft-tissue]
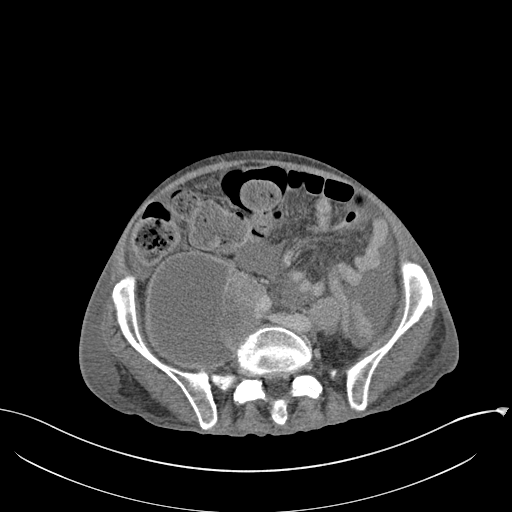
[im 55/82  soft-tissue]
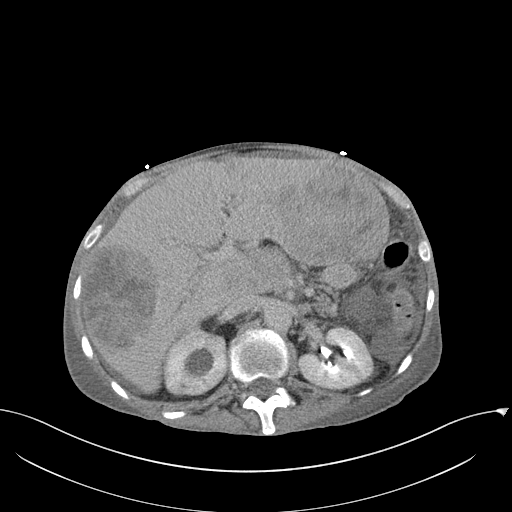

[9 of 46 positions shown; findings below may reference images not displayed]

FINDINGS: Respiratory motion blurred images throughout the examination. I do
believe that the evaluation is diagnostic, however.

CTA CHEST FINDINGS

Cardiovascular: Occlusive filling defect within the RIGHT LOWER LOBE
pulmonary artery and several of its branches as noted on the CTA
chest two weeks ago. There has been no interval recanalization of
any of the affected branches. No new filling defects in the
pulmonary arteries elsewhere in either lung. RIGHT heart strain is
again demonstrated with enlargement of the RIGHT ventricle.

No visible atherosclerosis involving the thoracic aorta or the
proximal great vessels. Dystrophic calcification in the aortic arch
at the insertion of the ligamentum arteriosum. No evidence of
thoracic aortic aneurysm.

Mediastinum/Nodes: LEFT supraclavicular lymph node measuring
approximately 1.3 cm short axis, unchanged since the examination 2
weeks ago. No new or enlarging lymphadenopathy in the chest. Normal
appearing esophagus. Normal-appearing thyroid gland.

Lungs/Pleura: Innumerable pulmonary parenchymal metastases as noted
previously, the largest measuring approximately 2.2 cm in the LEFT
UPPER LOBE (series 8, image 45, slightly increased in size even
since the examination 2 weeks ago. Interval development of large
BILATERAL pleural effusions, RIGHT greater than LEFT, with
associated dense passive atelectasis in the lower lobes. Pleural
metastases are visible posteriorly at the RIGHT base.

Musculoskeletal: No acute findings. No evidence of osseous
metastatic disease. Degenerative disc disease and spondylosis
involving the LOWER cervical spine.

Review of the MIP images confirms the above findings.

CT ABDOMEN and PELVIS FINDINGS

Hepatobiliary: Innumerable liver metastases with marked hepatic
enlargement as noted previously. Index masses were measured on the
examination 2 weeks ago. Gallbladder normal in appearance without
calcified gallstones. No biliary ductal dilation.

Pancreas: Normal in appearance without evidence of mass, ductal
dilation, or inflammation.

Spleen: Normal in size and appearance.

Adrenals/Urinary Tract: Normal appearing adrenal glands. Severe
RIGHT hydronephrosis, unchanged, related to obstruction by a large
RIGHT psoas muscle metastasis as noted previously. No evidence of
LEFT hydronephrosis. Cortical cysts involving the RIGHT kidney. No
visible urinary tract calculi.

Stomach/Bowel: Stomach decompressed and unremarkable.
Normal-appearing small bowel without evidence of obstruction.
Normal-appearing descending colostomy to the LEFT of midline at the
umbilical level. The descending colon is decompressed. The
transverse colon is upper normal in caliber, filled with gas.
Moderate stool burden in the normal caliber ascending colon and
cecum. No obstructing colonic lesion is identified. No evidence of
parastomal hernia. Surgically absent rectum related to the prior low
AP resection.

Vascular/Lymphatic: Mild aortic atherosclerosis without aneurysm.
Thrombus in the infrarenal IVC. Patent portal venous system.

Retroperitoneal lymphadenopathy in the aortocaval and LEFT
periaortic regions of the retroperitoneum as noted previously,
measured on the examination 2 weeks ago.

Reproductive: Prostate gland and seminal vesicles normal in size and
appearance for age.

Other: Multiple additional findings including:

1. Anasarca.
2. Large volume malignant ascites which has increased since the
examination 2 weeks ago.
3. Large RIGHT inguinal hernia containing the cecal tip and a large
amount of ascites.
4. Small LEFT inguinal hernia containing ascites.
5. Post radiation changes involving the presacral space, stable.

Musculoskeletal: No acute findings. No evidence of osseous
metastatic disease.

Review of the MIP images confirms the above findings.
IMPRESSION: CTA Chest:

1. No change in the occlusive thrombus involving the RIGHT LOWER
LOBE pulmonary artery and several of its branches when compared to
the prior CT 2 weeks ago.
2. No new pulmonary emboli elsewhere.
3. No change in the appearance of RIGHT heart strain with
enlargement of the RIGHT ventricle.
4. New large BILATERAL pleural effusions, RIGHT greater than LEFT,
likely malignant effusions.
5. Associated passive atelectasis in the lower lobes.
6. Innumerable lung metastases as noted previously, the largest in
the LEFT UPPER LOBE which has actually increased slightly in size
since the examination 2 weeks ago.
7. Stable LEFT supraclavicular lymph node. No evidence of
lymphadenopathy elsewhere in the chest.

CT Abdomen Pelvis:

1. Thrombus involving the infrarenal IVC.
2. Satisfactory appearance to the descending colostomy. The
descending colon is decompressed. There is no evidence of an
obstructing colonic lesion.
3. Multiple large hepatic metastases as noted previously, accounting
for hepatomegaly.
4. Large amount of malignant ascites, increased in amount since the
CT 2 weeks ago.
5. Large necrotic metastasis involving the RIGHT psoas muscle as
noted previously.
6. Stable severe RIGHT hydronephrosis related to obstruction by the
RIGHT psoas muscle metastasis.
7. Retroperitoneal nodal metastases in the aortocaval and LEFT
periaortic regions as noted previously.
8. Large RIGHT inguinal hernia containing the cecal tip and ascites.
Small LEFT inguinal hernia containing ascites.
9. Post surgical and post radiation changes in the presacral space
as noted previously.
10. Anasarca.
